# Patient Record
Sex: Female | Born: 1935 | Race: White | Hispanic: No | Marital: Married | Smoking: Never smoker
Health system: Southern US, Community
[De-identification: ages and names within clinical notes are randomized; demographics above are authoritative.]

## PROBLEM LIST (undated history)

## (undated) DIAGNOSIS — M25569 Pain in unspecified knee: Secondary | ICD-10-CM

## (undated) DIAGNOSIS — M25562 Pain in left knee: Principal | ICD-10-CM

## (undated) DIAGNOSIS — C801 Malignant (primary) neoplasm, unspecified: Secondary | ICD-10-CM

## (undated) DIAGNOSIS — M71112 Other infective bursitis, left shoulder: Secondary | ICD-10-CM

## (undated) DIAGNOSIS — I890 Lymphedema, not elsewhere classified: Secondary | ICD-10-CM

## (undated) DIAGNOSIS — G4700 Insomnia, unspecified: Secondary | ICD-10-CM

## (undated) DIAGNOSIS — I119 Hypertensive heart disease without heart failure: Secondary | ICD-10-CM

## (undated) DIAGNOSIS — I10 Essential (primary) hypertension: Secondary | ICD-10-CM

## (undated) DIAGNOSIS — H547 Unspecified visual loss: Secondary | ICD-10-CM

## (undated) DIAGNOSIS — R413 Other amnesia: Secondary | ICD-10-CM

## (undated) DIAGNOSIS — R55 Syncope and collapse: Secondary | ICD-10-CM

## (undated) DIAGNOSIS — M25519 Pain in unspecified shoulder: Secondary | ICD-10-CM

## (undated) DIAGNOSIS — R12 Heartburn: Secondary | ICD-10-CM

## (undated) DIAGNOSIS — N951 Menopausal and female climacteric states: Secondary | ICD-10-CM

## (undated) DIAGNOSIS — E507 Other ocular manifestations of vitamin A deficiency: Secondary | ICD-10-CM

## (undated) DIAGNOSIS — E785 Hyperlipidemia, unspecified: Secondary | ICD-10-CM

## (undated) DIAGNOSIS — Z853 Personal history of malignant neoplasm of breast: Secondary | ICD-10-CM

## (undated) DIAGNOSIS — M130 Polyarthritis, unspecified: Secondary | ICD-10-CM

## (undated) DIAGNOSIS — M810 Age-related osteoporosis without current pathological fracture: Secondary | ICD-10-CM

## (undated) DIAGNOSIS — K649 Unspecified hemorrhoids: Secondary | ICD-10-CM

## (undated) DIAGNOSIS — C50919 Malignant neoplasm of unspecified site of unspecified female breast: Secondary | ICD-10-CM

## (undated) DIAGNOSIS — R32 Unspecified urinary incontinence: Secondary | ICD-10-CM

## (undated) DIAGNOSIS — M79609 Pain in unspecified limb: Secondary | ICD-10-CM

## (undated) DIAGNOSIS — M25549 Pain in joints of unspecified hand: Secondary | ICD-10-CM

## (undated) DIAGNOSIS — G43009 Migraine without aura, not intractable, without status migrainosus: Secondary | ICD-10-CM

## (undated) DIAGNOSIS — K625 Hemorrhage of anus and rectum: Secondary | ICD-10-CM

## (undated) DIAGNOSIS — M549 Dorsalgia, unspecified: Secondary | ICD-10-CM

## (undated) DIAGNOSIS — IMO0002 Reserved for concepts with insufficient information to code with codable children: Secondary | ICD-10-CM

## (undated) DIAGNOSIS — G43909 Migraine, unspecified, not intractable, without status migrainosus: Secondary | ICD-10-CM

## (undated) DIAGNOSIS — B372 Candidiasis of skin and nail: Secondary | ICD-10-CM

## (undated) DIAGNOSIS — C73 Malignant neoplasm of thyroid gland: Principal | ICD-10-CM

## (undated) DIAGNOSIS — M25579 Pain in unspecified ankle and joints of unspecified foot: Secondary | ICD-10-CM

## (undated) DIAGNOSIS — M25561 Pain in right knee: Secondary | ICD-10-CM

## (undated) DIAGNOSIS — M7150 Other bursitis, not elsewhere classified, unspecified site: Secondary | ICD-10-CM

## (undated) DIAGNOSIS — E039 Hypothyroidism, unspecified: Secondary | ICD-10-CM

## (undated) HISTORY — DX: Pain in unspecified shoulder: M25.519

## (undated) HISTORY — DX: Candidiasis of skin and nail: B37.2

## (undated) HISTORY — DX: Polyarthritis, unspecified: M13.0

## (undated) HISTORY — DX: Reserved for concepts with insufficient information to code with codable children: IMO0002

## (undated) HISTORY — DX: Other ocular manifestations of vitamin A deficiency: E50.7

## (undated) HISTORY — DX: Unspecified hemorrhoids: K64.9

## (undated) HISTORY — DX: Personal history of malignant neoplasm of breast: Z85.3

## (undated) HISTORY — DX: Migraine, unspecified, not intractable, without status migrainosus: G43.909

## (undated) HISTORY — DX: Unspecified visual loss: H54.7

## (undated) HISTORY — DX: Essential (primary) hypertension: I10

## (undated) HISTORY — DX: Pain in unspecified ankle and joints of unspecified foot: M25.579

## (undated) HISTORY — DX: Migraine without aura, not intractable, without status migrainosus: G43.009

## (undated) HISTORY — DX: Other amnesia: R41.3

## (undated) HISTORY — DX: Hyperlipidemia, unspecified: E78.5

## (undated) HISTORY — DX: Age-related osteoporosis without current pathological fracture: M81.0

## (undated) HISTORY — DX: Other bursitis, not elsewhere classified, unspecified site: M71.50

## (undated) HISTORY — DX: Pain in unspecified knee: M25.569

## (undated) HISTORY — DX: Menopausal and female climacteric states: N95.1

## (undated) HISTORY — DX: Dorsalgia, unspecified: M54.9

## (undated) HISTORY — DX: Syncope and collapse: R55

## (undated) HISTORY — DX: Pain in left knee: M25.562

## (undated) HISTORY — DX: Hemorrhage of anus and rectum: K62.5

## (undated) HISTORY — DX: Malignant neoplasm of thyroid gland: C73

## (undated) HISTORY — DX: Hypothyroidism, unspecified: E03.9

## (undated) HISTORY — DX: Pain in joints of unspecified hand: M25.549

## (undated) HISTORY — DX: Pain in right knee: M25.561

## (undated) HISTORY — DX: Insomnia, unspecified: G47.00

## (undated) HISTORY — DX: Pain in unspecified limb: M79.609

## (undated) HISTORY — PX: ABDOMINAL HYSTERECTOMY: SHX81

## (undated) HISTORY — DX: Unspecified urinary incontinence: R32

## (undated) HISTORY — DX: Heartburn: R12

## (undated) HISTORY — DX: Hypertensive heart disease without heart failure: I11.9

## (undated) HISTORY — DX: Lymphedema, not elsewhere classified: I89.0

---

## 1979-12-10 HISTORY — PX: TOTAL ABDOMINAL HYSTERECTOMY W/ BILATERAL SALPINGOOPHORECTOMY: SHX83

## 1979-12-10 HISTORY — PX: BILATERAL OOPHORECTOMY: SHX1221

## 1993-12-09 DIAGNOSIS — I119 Hypertensive heart disease without heart failure: Secondary | ICD-10-CM

## 1993-12-09 HISTORY — DX: Hypertensive heart disease without heart failure: I11.9

## 1998-12-09 DIAGNOSIS — Z853 Personal history of malignant neoplasm of breast: Secondary | ICD-10-CM

## 1998-12-09 HISTORY — DX: Personal history of malignant neoplasm of breast: Z85.3

## 1999-10-10 HISTORY — PX: MASTECTOMY: SHX3

## 2000-01-10 HISTORY — PX: BREAST REDUCTION SURGERY: SHX8

## 2000-01-10 HISTORY — PX: BREAST RECONSTRUCTION: SHX9

## 2003-02-05 LAB — HM COLONOSCOPY: HM Colonoscopy: NEGATIVE

## 2004-01-10 HISTORY — PX: COLONOSCOPY: SHX174

## 2004-01-17 LAB — HM COLONOSCOPY: HM COLON: NEGATIVE

## 2005-09-04 HISTORY — PX: CATARACT EXTRACTION: SUR2

## 2010-04-03 LAB — HM DEXA SCAN

## 2012-05-16 DIAGNOSIS — G43909 Migraine, unspecified, not intractable, without status migrainosus: Secondary | ICD-10-CM

## 2012-05-16 HISTORY — DX: Migraine, unspecified, not intractable, without status migrainosus: G43.909

## 2012-12-09 DIAGNOSIS — M25561 Pain in right knee: Secondary | ICD-10-CM

## 2012-12-09 DIAGNOSIS — M25562 Pain in left knee: Secondary | ICD-10-CM

## 2012-12-09 HISTORY — PX: TOTAL KNEE ARTHROPLASTY: SHX125

## 2012-12-09 HISTORY — DX: Pain in right knee: M25.561

## 2012-12-09 HISTORY — DX: Pain in left knee: M25.562

## 2014-04-12 LAB — LIPID PANEL
Cholesterol: 207 mg/dL — AB (ref 0–200)
HDL: 48 mg/dL (ref 35–70)
LDL Cholesterol: 118 mg/dL
Triglycerides: 205 mg/dL — AB (ref 40–160)

## 2014-04-12 LAB — BASIC METABOLIC PANEL
BUN: 19 mg/dL (ref 4–21)
CREATININE: 0.9 mg/dL (ref 0.5–1.1)
GLUCOSE: 94 mg/dL
Potassium: 4.3 mmol/L (ref 3.4–5.3)
Sodium: 135 mmol/L — AB (ref 137–147)

## 2014-04-12 LAB — HEPATIC FUNCTION PANEL
ALT: 23 U/L (ref 7–35)
AST: 19 U/L (ref 13–35)
Alkaline Phosphatase: 70 U/L (ref 25–125)
BILIRUBIN DIRECT: 0.1 mg/dL (ref 0.01–0.4)
BILIRUBIN, TOTAL: 0.3 mg/dL

## 2014-04-12 LAB — CBC AND DIFFERENTIAL
HCT: 40 % (ref 36–46)
HEMOGLOBIN: 12.9 g/dL (ref 12.0–16.0)
Platelets: 333 10*3/uL (ref 150–399)
WBC: 5.8 10*3/mL

## 2014-04-12 LAB — TSH: TSH: 0.99 u[IU]/mL (ref 0.41–5.90)

## 2014-04-29 LAB — HM MAMMOGRAPHY: HM MAMMO: NEGATIVE

## 2014-08-02 ENCOUNTER — Non-Acute Institutional Stay: Payer: Medicare Other | Admitting: Internal Medicine

## 2014-08-02 ENCOUNTER — Encounter: Payer: Self-pay | Admitting: Internal Medicine

## 2014-08-02 VITALS — BP 144/94 | HR 60 | Temp 97.5°F | Ht 64.0 in | Wt 163.0 lb

## 2014-08-02 DIAGNOSIS — M25561 Pain in right knee: Secondary | ICD-10-CM

## 2014-08-02 DIAGNOSIS — M810 Age-related osteoporosis without current pathological fracture: Secondary | ICD-10-CM

## 2014-08-02 DIAGNOSIS — E039 Hypothyroidism, unspecified: Secondary | ICD-10-CM

## 2014-08-02 DIAGNOSIS — M25569 Pain in unspecified knee: Secondary | ICD-10-CM

## 2014-08-02 DIAGNOSIS — E507 Other ocular manifestations of vitamin A deficiency: Secondary | ICD-10-CM

## 2014-08-02 DIAGNOSIS — M25562 Pain in left knee: Principal | ICD-10-CM

## 2014-08-02 DIAGNOSIS — I10 Essential (primary) hypertension: Secondary | ICD-10-CM

## 2014-08-02 DIAGNOSIS — R32 Unspecified urinary incontinence: Secondary | ICD-10-CM

## 2014-08-02 DIAGNOSIS — H11149 Conjunctival xerosis, unspecified, unspecified eye: Secondary | ICD-10-CM

## 2014-08-02 DIAGNOSIS — R12 Heartburn: Secondary | ICD-10-CM

## 2014-08-02 DIAGNOSIS — G4700 Insomnia, unspecified: Secondary | ICD-10-CM

## 2014-08-02 DIAGNOSIS — E785 Hyperlipidemia, unspecified: Secondary | ICD-10-CM

## 2014-08-02 DIAGNOSIS — R413 Other amnesia: Secondary | ICD-10-CM

## 2014-09-13 DIAGNOSIS — M25569 Pain in unspecified knee: Secondary | ICD-10-CM | POA: Insufficient documentation

## 2014-10-30 ENCOUNTER — Encounter: Payer: Self-pay | Admitting: Internal Medicine

## 2014-10-30 DIAGNOSIS — G4700 Insomnia, unspecified: Secondary | ICD-10-CM | POA: Insufficient documentation

## 2014-10-30 DIAGNOSIS — R413 Other amnesia: Secondary | ICD-10-CM | POA: Insufficient documentation

## 2014-10-30 DIAGNOSIS — M810 Age-related osteoporosis without current pathological fracture: Secondary | ICD-10-CM | POA: Insufficient documentation

## 2014-10-30 DIAGNOSIS — I10 Essential (primary) hypertension: Secondary | ICD-10-CM | POA: Insufficient documentation

## 2014-10-30 DIAGNOSIS — E039 Hypothyroidism, unspecified: Secondary | ICD-10-CM | POA: Insufficient documentation

## 2014-10-30 DIAGNOSIS — R32 Unspecified urinary incontinence: Secondary | ICD-10-CM | POA: Insufficient documentation

## 2014-10-30 DIAGNOSIS — E785 Hyperlipidemia, unspecified: Secondary | ICD-10-CM | POA: Insufficient documentation

## 2014-10-30 DIAGNOSIS — M25562 Pain in left knee: Principal | ICD-10-CM

## 2014-10-30 DIAGNOSIS — M25561 Pain in right knee: Secondary | ICD-10-CM | POA: Insufficient documentation

## 2014-10-30 DIAGNOSIS — E507 Other ocular manifestations of vitamin A deficiency: Secondary | ICD-10-CM | POA: Insufficient documentation

## 2014-10-30 DIAGNOSIS — R12 Heartburn: Secondary | ICD-10-CM | POA: Insufficient documentation

## 2014-10-30 NOTE — Progress Notes (Signed)
Patient ID: Frances Holland, female   DOB: [birth year], 80 y.o.   MRN: [id number]    HISTORY AND PHYSICAL  Location:  [facility] of Service: Clinic (12)   Extended Emergency Contact Information Primary Emergency Contact: [name],[name] Address: [address]          [place], [place] [address] [name] of [place] Phone: [phone] Work Phone: [phone] Mobile Phone: [phone] Relation: Son  Advanced Directive information Does patient have an advance directive?: Yes, Type of Advance Directive: Healthcare Power of [place];Living will  Chief Complaint  Patient presents with  . Medical Management of Chronic Issues    New Patient, blood pressure, thyroid  . Knee Pain    left knee starting to hurt, right knee replacement 2014, starting to burn. Use ice on both knees at night.    HPI:  Knee pain, bilateral: Patient describes trouble with her knees knee is starting to hurt. She had a right knee replacement 2014. This knee is no burning. She uses ice on both knees at night. She is also taking Tylenol. She has an appointment with Dr. Lorre Nick and October 2015. She has been getting an injection of steroids in the left knee about every 4 to 5 months.  Memory impairment: Mild. In 03/14/2014 the MMSE scored 29/30 and she passed the clock drawing.  Essential hypertension: Controlled  Heartburn: Using Tums. Has a history of belching. This stopped after the Tums. She was using ibuprofen when most symptomatic.  Hyperlipemia: Controlled  Senile osteoporosis: Supplements with vitamin D and calcium   Hypothyroidism, unspecified hypothyroidism type: Taking supplements after dinner.  Urinary incontinence, unspecified incontinence type: None cystocele. She wears protective pads.  Insomnia: Using warm milk to help sleep at night.   Xerophthalmia: Using Systane eyedrops     Past Medical History  Diagnosis Date  . Symptomatic menopausal or female climacteric  states   . Malignant neoplasm of breast (female), unspecified site 10/1999    left  . Pain in joint, lower leg   . Unspecified urinary incontinence   . Syncope and collapse   . Unspecified hypothyroidism   . Other lymphedema   . Senile osteoporosis   . Other and unspecified hyperlipidemia   . Unspecified polyarthropathy or polyarthritis, site unspecified   . Migraine without aura, without mention of intractable migraine without mention of status migrainosus   . Unspecified hypertensive heart disease without heart failure 1995  . Candidiasis of skin and nails   . Pain in joint, ankle and foot   . Unspecified visual loss   . Cellulitis and abscess of upper arm and forearm   . Pain in limb   . Other bursitis disorders   . Pain in joint, shoulder region   . Pain in joint, hand   . Hypertension   . Heartburn   . Memory impairment   . Insomnia   . Back pain   . Xerophthalmia   . History of breast cancer     History left mastectomy  . Lymphedema     Left arm following mastectomy  . Knee pain, bilateral 2014    Past Surgical History  Procedure Laterality Date  . Total abdominal hysterectomy w/ bilateral salpingoophorectomy  1981  . Bilateral oophorectomy  1981  . Mastectomy Left 10/1999  . Breast reduction surgery Right 01/2000  . Breast reconstruction Left 01/2000  . Colonoscopy  01/2004  . Total knee arthroplasty Right 2014    Patient Care Team: Estill Dooms,  MD as PCP - General (Internal Medicine)  History   Social History  . Marital Status: Married    Spouse Name: N/A    Number of Children: N/A  . Years of Education: N/A   Occupational History  . retired Estate agent     Social History Main Topics  . Smoking status: Never Smoker   . Smokeless tobacco: Never Used  . Alcohol Use: No  . Drug Use: No  . Sexual Activity: Not on file   Other Topics Concern  . Not on file   Social History Narrative   Lives at [facility] since 07/07/2014   Married  -[name]   Never smoked    Alcohol none   Caffeine 2 cups of coffee daily   Exercise walks daily    Living will,  POA     reports that she has never smoked. She has never used smokeless tobacco. She reports that she does not drink alcohol or use illicit drugs.  Family History  Problem Relation Age of Onset  . Heart disease Mother    Family Status  Relation Status Death Age  . Mother Deceased 18  . Father Deceased 67    old age  . Sister Alive   . Brother Alive   . Daughter Alive   . Son Alive   . Son Alive     Immunization History  Administered Date(s) Administered  . Influenza-Unspecified 08/10/2013  . Pneumococcal Polysaccharide-23 12/09/2002  . Tdap 02/21/2014    Allergies  Allergen Reactions  . Sulfa Antibiotics     Medications: Patient's Medications  New Prescriptions   No medications on file  Previous Medications   AMLODIPINE (NORVASC) 2.5 MG TABLET    Take 2.5 mg by mouth. Take one tablet daily for blood pressure   CALCIUM CITRATE-VITAMIN D (CALCIUM CITRATE + D PO)    Take 630 mg by mouth. With 500iu D one tablet daily in morning   CHOLECALCIFEROL (VITAMIN D) 2000 UNITS TABLET    Take 2,000 Units by mouth daily.   LEVOTHYROXINE (SYNTHROID, LEVOTHROID) 75 MCG TABLET    Take 75 mcg by mouth. Take one tablet daily for thyroid at dinner   LOSARTAN-HYDROCHLOROTHIAZIDE (HYZAAR) 100-12.5 MG PER TABLET    Take 1 tablet by mouth. Take one tablet daily for blood pressure   MULTIPLE VITAMINS-MINERALS (CENTRUM SILVER PO)    Take by mouth. Take one tablet daily   OMEGA-3 FATTY ACIDS (FISH OIL) 1200 MG CAPS    Take by mouth. Take one tablet daily  Modified Medications   No medications on file  Discontinued Medications   No medications on file    Review of Systems  Constitutional: Negative.  Negative for fever, chills, diaphoresis, activity change, appetite change, fatigue and unexpected weight change.  HENT: Negative.  Negative for congestion, ear discharge, ear  pain, hearing loss, postnasal drip, rhinorrhea, sore throat, tinnitus, trouble swallowing and voice change.   Eyes: Negative for pain, redness, itching and visual disturbance.       Corrective lenses. Dry eyes.  Respiratory: Negative.  Negative for cough, choking, shortness of breath and wheezing.   Cardiovascular: Negative for chest pain, palpitations and leg swelling.       History of hypertension.   Gastrointestinal: Negative for nausea, abdominal pain, diarrhea, constipation and abdominal distention.       Flatulence. History of hemorrhoids.  Endocrine: Negative for cold intolerance, heat intolerance, polydipsia, polyphagia and polyuria.       History of hypothyroidism  Genitourinary: Positive for frequency. Negative for dysuria, urgency, hematuria, flank pain, vaginal discharge, difficulty urinating and pelvic pain.       Status post hysterectomy.  Musculoskeletal: Positive for back pain. Negative for myalgias, arthralgias, gait problem, neck pain and neck stiffness.       History of leg cramps.  Skin: Negative for color change, pallor and rash.  Allergic/Immunologic: Negative.   Neurological: Negative for dizziness, tremors, seizures, syncope, weakness, numbness and headaches.  Hematological: Negative for adenopathy. Does not bruise/bleed easily.  Psychiatric/Behavioral: Negative for suicidal ideas, hallucinations, behavioral problems, confusion, sleep disturbance, dysphoric mood and agitation. The patient is not nervous/anxious and is not hyperactive.     Filed Vitals:   08/02/14 1015  BP: 144/94  Pulse: 60  Temp: 97.5 F (36.4 C)  TempSrc: Oral  Height: 5\' 4"  (1.626 m)  Weight: 163 lb (73.936 kg)  SpO2: 97%   Body mass index is 27.97 kg/(m^2).  Physical Exam  Constitutional: She is oriented to person, place, and time. She appears well-developed and well-nourished. No distress.  HENT:  Right Ear: External ear normal.  Left Ear: External ear normal.  Nose: Nose normal.    Mouth/Throat: Oropharynx is clear and moist. No oropharyngeal exudate.  Eyes: Conjunctivae and EOM are normal. Pupils are equal, round, and reactive to light. No scleral icterus.  Neck: No JVD present. No tracheal deviation present. No thyromegaly present.  Cardiovascular: Normal rate, regular rhythm, normal heart sounds and intact distal pulses.  Exam reveals no gallop and no friction rub.   No murmur heard. Pulmonary/Chest: Effort normal. No respiratory distress. She has no wheezes. She has no rales. She exhibits no tenderness.  Abdominal: She exhibits no distension and no mass. There is no tenderness.  Genitourinary:  History of hysterectomy  Musculoskeletal: Normal range of motion. She exhibits tenderness (Both knees). She exhibits no edema.  Lymphedema left arm status post mastectomy.  Lymphadenopathy:    She has no cervical adenopathy.  Neurological: She is alert and oriented to person, place, and time. No cranial nerve deficit. Coordination normal.  Skin: No rash noted. She is not diaphoretic. No erythema. No pallor.  Psychiatric: She has a normal mood and affect. Her behavior is normal. Judgment and thought content normal.     Labs reviewed: Nursing Home on 08/02/2014  Component Date Value Ref Range Status  . HM Colonoscopy 02/05/2003 Negative   Final  Abstract on 08/01/2014  Component Date Value Ref Range Status  . Hemoglobin 04/12/2014 12.9  12.0 - 16.0 g/dL Final  . HCT 04/12/2014 40  36 - 46 % Final  . Platelets 04/12/2014 333  150 - 399 K/L Final  . WBC 04/12/2014 5.8   Final  . HM Mammogram 04/29/2014 negative. [facility]    Final  . HM Dexa Scan 04/03/2010 [facility]   Final  . Glucose 04/12/2014 94   Final  . BUN 04/12/2014 19  4 - 21 mg/dL Final  . Creatinine 04/12/2014 0.9  0.5 - 1.1 mg/dL Final  . Potassium 04/12/2014 4.3  3.4 - 5.3 mmol/L Final  . Sodium 04/12/2014 135* 137 - 147 mmol/L Final  . Triglycerides 04/12/2014 205* 40 - 160  mg/dL Final  . Cholesterol 04/12/2014 207* 0 - 200 mg/dL Final  . HDL 04/12/2014 48  35 - 70 mg/dL Final  . LDL Cholesterol 04/12/2014 118   Final  . Alkaline Phosphatase 04/12/2014 70  25 - 125 U/L Final  . ALT 04/12/2014 23  7 -  35 U/L Final  . AST 04/12/2014 19  13 - 35 U/L Final  . Bilirubin, Direct 04/12/2014 0.1  0.01 - 0.4 mg/dL Final  . Bilirubin, Total 04/12/2014 0.3   Final  . TSH 04/12/2014 0.99  0.41 - 5.90 uIU/mL Final       Assessment/Plan 1. Knee pain, bilateral Continue with plans to see orthopedist  2. Memory impairment Mild. No medications needed at this time.  3. Essential hypertension Mild elevation in both SBP and DBP  4. Heartburn Asymptomatic at this time  5. Hyperlipemia Adequately controlled  6. Senile osteoporosis Continue with calcium and vitamin D  7. Hypothyroidism, unspecified hypothyroidism type Continue with 75 g levothyroxine daily  8. Urinary incontinence, unspecified incontinence type Discussed. Offered urology consultation. Patient prefers just to go on with her protective pads.  9. Insomnia Continue with warm milk  10. Xerophthalmia Continue with Systane drops

## 2015-05-12 ENCOUNTER — Other Ambulatory Visit: Payer: Self-pay | Admitting: *Deleted

## 2015-05-12 MED ORDER — AMLODIPINE BESYLATE 2.5 MG PO TABS: ORAL_TABLET | ORAL | Status: DC

## 2015-05-12 NOTE — Telephone Encounter (Signed)
[facility] [address] 

## 2015-05-16 ENCOUNTER — Encounter: Payer: Self-pay | Admitting: Internal Medicine

## 2015-05-16 ENCOUNTER — Non-Acute Institutional Stay: Payer: Medicare Other | Admitting: Internal Medicine

## 2015-05-16 VITALS — BP 144/84 | HR 72 | Temp 97.6°F | Ht 64.5 in | Wt 161.0 lb

## 2015-05-16 DIAGNOSIS — M25562 Pain in left knee: Secondary | ICD-10-CM

## 2015-05-16 DIAGNOSIS — M25561 Pain in right knee: Secondary | ICD-10-CM | POA: Diagnosis not present

## 2015-05-16 DIAGNOSIS — R32 Unspecified urinary incontinence: Secondary | ICD-10-CM | POA: Diagnosis not present

## 2015-05-16 DIAGNOSIS — R12 Heartburn: Secondary | ICD-10-CM

## 2015-05-16 DIAGNOSIS — I10 Essential (primary) hypertension: Secondary | ICD-10-CM

## 2015-05-16 DIAGNOSIS — M810 Age-related osteoporosis without current pathological fracture: Secondary | ICD-10-CM | POA: Diagnosis not present

## 2015-05-16 DIAGNOSIS — G43909 Migraine, unspecified, not intractable, without status migrainosus: Secondary | ICD-10-CM

## 2015-05-16 DIAGNOSIS — E039 Hypothyroidism, unspecified: Secondary | ICD-10-CM

## 2015-05-16 DIAGNOSIS — Z853 Personal history of malignant neoplasm of breast: Secondary | ICD-10-CM | POA: Diagnosis not present

## 2015-05-16 DIAGNOSIS — R413 Other amnesia: Secondary | ICD-10-CM | POA: Diagnosis not present

## 2015-05-16 DIAGNOSIS — E785 Hyperlipidemia, unspecified: Secondary | ICD-10-CM | POA: Diagnosis not present

## 2015-05-16 DIAGNOSIS — G4700 Insomnia, unspecified: Secondary | ICD-10-CM

## 2015-05-16 NOTE — Progress Notes (Signed)
Passed clock drawing 

## 2015-05-16 NOTE — Progress Notes (Signed)
Patient ID: Frances Holland, female   DOB: 1929-06-20, 81 y.o.   MRN: [id number]    HISTORY AND PHYSICAL  Location:  [place] of Service: Clinic (12)   Extended Emergency Contact Information Primary Emergency Contact: [name],[name] Address: [address]          [place], [place] [address] [name] of [place] Phone: [phone] Work Phone: [phone] Mobile Phone: [phone] Relation: Son  Advanced Directive information Does patient have an advance directive?: Yes, Type of Advance Directive: Healthcare Power of [place];Living will  Chief Complaint  Patient presents with  . Annual Exam    Comprehensive exam: blood pressure, thyroid, cholesterol, insomnia  . leakage    urine, up twice at night    HPI:  Patient last seen 6 months ago. Presents today for complete physical exam.  Essential hypertension: Controlled  Hypothyroidism, unspecified hypothyroidism type: Compensated  Hyperlipemia: Controlled  Knee pain, bilateral: Persistent problem for several years. Status post right TKR. No joint swelling. No significant change over the last few years.  Memory impairment: Noted in 2014 by previous physician. Current MMSE 26/30.  Heartburn: Asymptomatic  Insomnia: Improved  Senile osteoporosis: Last bone density 2014  Urinary incontinence, unspecified incontinence type: Persistent problem. Has never had urologic evaluation. Previous physician told her she had "dropped bladder".  Migraine without status migrainosus, not intractable, unspecified migraine type: No recent migraine headaches.    Past Medical History  Diagnosis Date  . Symptomatic menopausal or female climacteric states   . Pain in joint, lower leg   . Unspecified urinary incontinence   . Syncope and collapse   . Unspecified hypothyroidism   . Senile osteoporosis   . Other and unspecified hyperlipidemia   . Unspecified polyarthropathy or polyarthritis, site unspecified     . Migraine without aura, without mention of intractable migraine without mention of status migrainosus   . Unspecified hypertensive heart disease without heart failure 1995  . Candidiasis of skin and nails   . Pain in joint, ankle and foot   . Unspecified visual loss   . Cellulitis and abscess of upper arm and forearm   . Pain in limb   . Other bursitis disorders   . Pain in joint, shoulder region   . Pain in joint, hand   . Hypertension   . Heartburn   . Memory impairment   . Insomnia   . Back pain   . Xerophthalmia   . History of breast cancer 2000    History left mastectomy  . Lymphedema     Left arm following mastectomy  . Knee pain, bilateral 2014    Past Surgical History  Procedure Laterality Date  . Total abdominal hysterectomy w/ bilateral salpingoophorectomy  1981  . Bilateral oophorectomy  1981  . Mastectomy Left 10/1999  . Breast reduction surgery Right 01/2000  . Breast reconstruction Left 01/2000  . Colonoscopy  01/2004  . Total knee arthroplasty Right 2014    Patient Care Team: Estill Dooms, MD as PCP - General (Internal Medicine)  History   Social History  . Marital Status: Married    Spouse Name: N/A  . Number of Children: N/A  . Years of Education: N/A   Occupational History  . retired Estate agent     Social History Main Topics  . Smoking status: Never Smoker   . Smokeless tobacco: Never Used  . Alcohol Use: No  . Drug Use: No  . Sexual Activity: Not on file  Other Topics Concern  . Not on file   Social History Narrative   Lives at [facility] since 07/07/2014   Married -[name]   Never smoked    Alcohol none   Caffeine 2 cups of coffee daily   Exercise walks daily, exercise class half hour 2-3 times a week   Living will,  POA     reports that she has never smoked. She has never used smokeless tobacco. She reports that she does not drink alcohol or use illicit drugs.  Family History  Problem Relation Age of Onset  .  Heart disease Mother    Family Status  Relation Status Death Age  . Mother Deceased 32  . Father Deceased 41    old age  . Sister Alive   . Daughter Alive   . Son Alive   . Son Alive   . Sister Alive     Immunization History  Administered Date(s) Administered  . Influenza-Unspecified 08/10/2013  . Pneumococcal Polysaccharide-23 12/09/2002  . Tdap 02/21/2014    Allergies  Allergen Reactions  . Sulfa Antibiotics     Medications: Patient's Medications  New Prescriptions   No medications on file  Previous Medications   AMLODIPINE (NORVASC) 2.5 MG TABLET    Take one tablet by mouth once daily for blood pressure   ASPIRIN 81 MG TABLET    Take 81 mg by mouth daily. Take one tablet at bedtime   CALCIUM CITRATE-VITAMIN D (CALCIUM CITRATE + D PO)    Take 630 mg by mouth. With 500iu D one tablet daily in morning   CHOLECALCIFEROL (VITAMIN D) 2000 UNITS TABLET    Take by mouth. Take 1,000 units in evening   DIPHENHYDRAMINE-ACETAMINOPHEN (TYLENOL PM) 25-500 MG TABS    Take 1 tablet by mouth. Take one at bedtime   LEVOTHYROXINE (SYNTHROID, LEVOTHROID) 75 MCG TABLET    Take 75 mcg by mouth. Take one tablet daily for thyroid at dinner   LOSARTAN-HYDROCHLOROTHIAZIDE (HYZAAR) 100-12.5 MG PER TABLET    Take 1 tablet by mouth. Take one tablet daily for blood pressure, in morning   MULTIPLE VITAMINS-MINERALS (CENTRUM SILVER PO)    Take by mouth. Take one tablet daily in morning   OMEGA-3 FATTY ACIDS (FISH OIL) 1200 MG CAPS    Take by mouth. Take one tablet daily, in morning  Modified Medications   No medications on file  Discontinued Medications   No medications on file    Review of Systems  Constitutional: Negative.  Negative for fever, chills, diaphoresis, activity change, appetite change, fatigue and unexpected weight change.  HENT: Negative for congestion, ear discharge, ear pain, hearing loss, postnasal drip, rhinorrhea, sore throat, tinnitus, trouble swallowing and voice change.     Eyes: Negative for pain, redness, itching and visual disturbance.       Corrective lenses. Dry eyes.  Respiratory: Negative.  Negative for cough, choking, shortness of breath and wheezing.   Cardiovascular: Negative for chest pain, palpitations and leg swelling.       History of hypertension.  Gastrointestinal: Negative for nausea, abdominal pain, diarrhea, constipation and abdominal distention.       Flatulence. History of hemorrhoids.  Endocrine: Negative for cold intolerance, heat intolerance, polydipsia, polyphagia and polyuria.       History of hypothyroidism  Genitourinary: Positive for frequency. Negative for dysuria, urgency, hematuria, flank pain, vaginal discharge, difficulty urinating and pelvic pain.       History of hysterectomy and bilateral salpingo-oophorectomy.  Musculoskeletal: Positive for  back pain. Negative for myalgias, arthralgias, gait problem, neck pain and neck stiffness.       History of leg cramps.  Skin: Negative for color change, pallor and rash.  Allergic/Immunologic: Negative.   Neurological: Negative for dizziness, tremors, seizures, syncope, weakness, numbness and headaches.       History migraine headaches.  Hematological: Negative for adenopathy. Does not bruise/bleed easily.  Psychiatric/Behavioral: Negative for suicidal ideas, hallucinations, behavioral problems, confusion, sleep disturbance, dysphoric mood and agitation. The patient is not nervous/anxious and is not hyperactive.     Filed Vitals:   05/16/15 1054  BP: 144/84  Pulse: 72  Temp: 97.6 F (36.4 C)  TempSrc: Oral  Height: 5' 4.5" (1.638 m)  Weight: 161 lb (73.029 kg)  SpO2: 99%   Body mass index is 27.22 kg/(m^2).  Physical Exam  Constitutional: She is oriented to person, place, and time. She appears well-developed and well-nourished. No distress.  HENT:  Right Ear: External ear normal.  Left Ear: External ear normal.  Nose: Nose normal.  Mouth/Throat: Oropharynx is clear and  moist. No oropharyngeal exudate.  Eyes: Conjunctivae and EOM are normal. Pupils are equal, round, and reactive to light. No scleral icterus.  Neck: No JVD present. No tracheal deviation present. No thyromegaly present.  Cardiovascular: Normal rate, regular rhythm, normal heart sounds and intact distal pulses.  Exam reveals no gallop and no friction rub.   No murmur heard. Pulmonary/Chest: Effort normal. No respiratory distress. She has no wheezes. She has no rales. She exhibits no tenderness.  Partial left mastectomy. Scars from breast reduction surgery of the right breast.  Abdominal: She exhibits no distension and no mass. There is no tenderness.  Genitourinary:  Absent cervix. Absent ovaries. Mild cystocele. Stool heme-negative.  Musculoskeletal: Normal range of motion. She exhibits tenderness (Both knees). She exhibits no edema.  Lymphedema left arm status post mastectomy.  Lymphadenopathy:    She has no cervical adenopathy.  Neurological: She is alert and oriented to person, place, and time. No cranial nerve deficit. Coordination normal.  05/15/2015 MMSE 26/30. Passed clock drawing.  Skin: No rash noted. She is not diaphoretic. No erythema. No pallor.  Scars left and right breasts and right knee.  Psychiatric: She has a normal mood and affect. Her behavior is normal. Judgment and thought content normal.     Labs reviewed: Nursing Home on 05/16/2015  Component Date Value Ref Range Status  . HM Colonoscopy 01/17/2004 Negative   Final     Assessment/Plan  1. Essential hypertension Controlled  2. Hypothyroidism, unspecified hypothyroidism type Compensated  3. Hyperlipemia Controlled  4. Knee pain, bilateral No significant change. Bothersome walking frequently.  5. Memory impairment Mild impairment  6. Heartburn Asymptomatic  7. Insomnia Improved  8. Senile osteoporosis Remain on current supplements. Schedule DEXA scan.  9. Urinary incontinence, unspecified  incontinence type Discussed referral to urologist with patient. She prefers to wait.  10. Migraine without status migrainosus, not intractable, unspecified migraine type Remote history

## 2015-05-17 ENCOUNTER — Encounter: Payer: Self-pay | Admitting: Internal Medicine

## 2015-05-17 DIAGNOSIS — Z853 Personal history of malignant neoplasm of breast: Secondary | ICD-10-CM | POA: Insufficient documentation

## 2015-05-31 ENCOUNTER — Telehealth: Payer: Self-pay | Admitting: *Deleted

## 2015-05-31 ENCOUNTER — Encounter: Payer: Self-pay | Admitting: *Deleted

## 2015-05-31 DIAGNOSIS — M810 Age-related osteoporosis without current pathological fracture: Secondary | ICD-10-CM

## 2015-05-31 NOTE — Telephone Encounter (Signed)
Anderson Malta with [facility] called back and stated that we would find the Dexa in Media. Looked but only seen 2011 Dexa. Jennifer apoligized and stated that she was looking at the encounter date. Placed order back in Epic and she will call patient to schedule an appointment.

## 2015-05-31 NOTE — Telephone Encounter (Signed)
Received fax from Sandre Kitty at [facility] regarding Taina Landry stating "This patient had a Dexa in 2015. Insurance Jaxyn Mestas not cover another exam in 2016. Insurance typically only covers wvery 2 years. Patient can be scheduled; however, she Shlonda Dolloff be responsible for the charges for the exam. Do you still want the exam scheduled for this patient?"   Per Dr. Manning Charity exam. Please send report from 2015. It is not in EPIC. Fax to [phone]  I called and left voicemail message for Sandre Kitty at [facility] stating what Dr. Nyoka Cowden said and asked her to fax the Dexa Scan to [place] and cancel the Dexa. Gave her our number if any questions.

## 2015-05-31 NOTE — Addendum Note (Signed)
Addended by: Rafael Bihari A on: 05/31/2015 12:33 PM   Modules accepted: Orders

## 2015-06-01 NOTE — Telephone Encounter (Signed)
error 

## 2015-06-06 ENCOUNTER — Other Ambulatory Visit: Payer: Self-pay | Admitting: Internal Medicine

## 2015-06-06 DIAGNOSIS — Z1231 Encounter for screening mammogram for malignant neoplasm of breast: Secondary | ICD-10-CM

## 2015-06-06 DIAGNOSIS — Z9012 Acquired absence of left breast and nipple: Secondary | ICD-10-CM

## 2015-06-29 ENCOUNTER — Ambulatory Visit
Admission: RE | Admit: 2015-06-29 | Discharge: 2015-06-29 | Disposition: A | Discharge status: Home / Self Care | Payer: Medicare Other | Source: Ambulatory Visit | Attending: Internal Medicine | Admitting: Internal Medicine

## 2015-06-29 DIAGNOSIS — Z1231 Encounter for screening mammogram for malignant neoplasm of breast: Secondary | ICD-10-CM

## 2015-06-29 DIAGNOSIS — M810 Age-related osteoporosis without current pathological fracture: Secondary | ICD-10-CM

## 2015-06-29 DIAGNOSIS — Z9012 Acquired absence of left breast and nipple: Secondary | ICD-10-CM

## 2015-07-03 ENCOUNTER — Other Ambulatory Visit: Payer: Self-pay | Admitting: *Deleted

## 2015-07-03 MED ORDER — LOSARTAN POTASSIUM-HCTZ 100-12.5 MG PO TABS: ORAL_TABLET | ORAL | Status: DC

## 2015-07-03 MED ORDER — LEVOTHYROXINE SODIUM 75 MCG PO TABS: ORAL_TABLET | ORAL | Status: DC

## 2015-07-03 NOTE — Telephone Encounter (Signed)
[place]

## 2015-07-04 ENCOUNTER — Telehealth: Payer: Self-pay

## 2015-07-04 NOTE — Telephone Encounter (Signed)
Called spoke with patient about Dr. Hervey Ard notes she said she understood what she was told.

## 2015-07-10 ENCOUNTER — Encounter: Payer: Self-pay | Admitting: Internal Medicine

## 2015-11-01 ENCOUNTER — Ambulatory Visit: Payer: Medicare Other | Admitting: Podiatry

## 2015-11-06 ENCOUNTER — Other Ambulatory Visit: Payer: Self-pay

## 2015-11-06 LAB — HEPATIC FUNCTION PANEL
ALT: 20 U/L (ref 7–35)
AST: 19 U/L (ref 13–35)
Alkaline Phosphatase: 65 U/L (ref 25–125)
Bilirubin, Total: 0.4 mg/dL

## 2015-11-06 LAB — LIPID PANEL
CHOLESTEROL: 204 mg/dL — AB (ref 0–200)
HDL: 47 mg/dL (ref 35–70)
LDL CALC: 113 mg/dL
Triglycerides: 221 mg/dL — AB (ref 40–160)

## 2015-11-06 LAB — BASIC METABOLIC PANEL
BUN: 14 mg/dL (ref 4–21)
Creatinine: 0.8 mg/dL (ref 0.5–1.1)
GLUCOSE: 83 mg/dL
Potassium: 4.4 mmol/L (ref 3.4–5.3)
Sodium: 139 mmol/L (ref 137–147)

## 2015-11-06 LAB — TSH: TSH: 2.95 u[IU]/mL (ref 0.41–5.90)

## 2015-11-14 ENCOUNTER — Non-Acute Institutional Stay: Payer: Medicare Other | Admitting: Internal Medicine

## 2015-11-14 VITALS — BP 138/80 | HR 80 | Resp 14 | Wt 161.6 lb

## 2015-11-14 DIAGNOSIS — I10 Essential (primary) hypertension: Secondary | ICD-10-CM | POA: Diagnosis not present

## 2015-11-14 DIAGNOSIS — E039 Hypothyroidism, unspecified: Secondary | ICD-10-CM

## 2015-11-14 DIAGNOSIS — E785 Hyperlipidemia, unspecified: Secondary | ICD-10-CM | POA: Diagnosis not present

## 2015-11-14 DIAGNOSIS — R12 Heartburn: Secondary | ICD-10-CM | POA: Diagnosis not present

## 2015-11-14 DIAGNOSIS — M25561 Pain in right knee: Secondary | ICD-10-CM

## 2015-11-14 NOTE — Progress Notes (Signed)
Patient ID: Frances Holland, female   DOB: 10-30-1929, 81 y.o.   MRN: [id number]    [facility]     Place of Service: Clinic (12)     Allergies  Allergen Reactions  . Sulfa Antibiotics   . Sulfamethoxazole Rash    Chief Complaint  Patient presents with  . Medical Management of Chronic Issues    6 mo f/u HTN, some sleeping issues, right knee has some burning    HPI:  Right knee pain - status post TKR. Burning sensations at night.  Essential hypertension - controlled  Hypothyroidism, unspecified hypothyroidism type - compensated  Hyperlipemia - controlled  Heartburn - sensation of needing to work frequently. It is not every day. There is no nausea.    Medications: Patient's Medications  New Prescriptions   No medications on file  Previous Medications   AMLODIPINE (NORVASC) 2.5 MG TABLET    Take one tablet by mouth once daily for blood pressure   ASPIRIN 81 MG TABLET    Take 81 mg by mouth daily. Take one tablet at bedtime   CALCIUM CITRATE-VITAMIN D (CALCIUM CITRATE + D PO)    Take 630 mg by mouth. With 500iu D one tablet daily in morning   CHOLECALCIFEROL (VITAMIN D) 2000 UNITS TABLET    Take by mouth. Take 1,000 units in evening   DIPHENHYDRAMINE-ACETAMINOPHEN (TYLENOL PM) 25-500 MG TABS    Take 1 tablet by mouth. Take one at bedtime   LEVOTHYROXINE (SYNTHROID, LEVOTHROID) 75 MCG TABLET    Take one tablet by mouth once daily 30 minutes before breakfast for thyroid   LOSARTAN-HYDROCHLOROTHIAZIDE (HYZAAR) 100-12.5 MG PER TABLET    Take one tablet by mouth once daily for blood pressure, in morning   MULTIPLE VITAMINS-MINERALS (CENTRUM SILVER PO)    Take by mouth. Take one tablet daily in morning   OMEGA-3 FATTY ACIDS (FISH OIL) 1200 MG CAPS    Take by mouth. Take one tablet daily, in morning  Modified Medications   No medications on file  Discontinued Medications   No medications on file     Review of Systems  Constitutional: Negative.  Negative for  fever, chills, diaphoresis, activity change, appetite change, fatigue and unexpected weight change.  HENT: Negative for congestion, ear discharge, ear pain, hearing loss, postnasal drip, rhinorrhea, sore throat, tinnitus, trouble swallowing and voice change.   Eyes: Negative for pain, redness, itching and visual disturbance.       Corrective lenses. Dry eyes.  Respiratory: Negative.  Negative for cough, choking, shortness of breath and wheezing.   Cardiovascular: Negative for chest pain, palpitations and leg swelling.       History of hypertension.  Gastrointestinal: Negative for nausea, abdominal pain, diarrhea, constipation and abdominal distention.       Flatulence. History of hemorrhoids. Sensations of having to burp.  Endocrine: Negative for cold intolerance, heat intolerance, polydipsia, polyphagia and polyuria.       History of hypothyroidism  Genitourinary: Positive for frequency. Negative for dysuria, urgency, hematuria, flank pain, vaginal discharge, difficulty urinating and pelvic pain.       History of hysterectomy and bilateral salpingo-oophorectomy.  Musculoskeletal: Positive for back pain. Negative for myalgias, arthralgias, gait problem, neck pain and neck stiffness.       History of leg cramps. Burning at the right knee that was replaced.   Skin: Negative for color change, pallor and rash.  Allergic/Immunologic: Negative.   Neurological: Negative for dizziness, tremors, seizures, syncope, weakness, numbness and headaches.  History migraine headaches.  Hematological: Negative for adenopathy. Does not bruise/bleed easily.  Psychiatric/Behavioral: Negative for suicidal ideas, hallucinations, behavioral problems, confusion, sleep disturbance, dysphoric mood and agitation. The patient is not nervous/anxious and is not hyperactive.     Filed Vitals:   11/14/15 0919  BP: 138/80  Pulse: 80  Resp: 14  Weight: 161 lb 9.6 oz (73.301 kg)   Body mass index is 27.32  kg/(m^2).  Physical Exam  Constitutional: She is oriented to person, place, and time. She appears well-developed and well-nourished. No distress.  HENT:  Right Ear: External ear normal.  Left Ear: External ear normal.  Nose: Nose normal.  Mouth/Throat: Oropharynx is clear and moist. No oropharyngeal exudate.  Eyes: Conjunctivae and EOM are normal. Pupils are equal, round, and reactive to light. No scleral icterus.  Neck: No JVD present. No tracheal deviation present. No thyromegaly present.  Cardiovascular: Normal rate, regular rhythm, normal heart sounds and intact distal pulses.  Exam reveals no gallop and no friction rub.   No murmur heard. Pulmonary/Chest: Effort normal. No respiratory distress. She has no wheezes. She has no rales. She exhibits no tenderness.  Partial left mastectomy. Scars from breast reduction surgery of the right breast.  Abdominal: She exhibits no distension and no mass. There is no tenderness.  Genitourinary:  Absent cervix. Absent ovaries. Mild cystocele. Stool heme-negative.  Musculoskeletal: Normal range of motion. She exhibits tenderness (Both knees). She exhibits no edema.  Lymphedema left arm status post mastectomy.  Lymphadenopathy:    She has no cervical adenopathy.  Neurological: She is alert and oriented to person, place, and time. No cranial nerve deficit. Coordination normal.  05/15/2015 MMSE 26/30. Passed clock drawing.  Skin: No rash noted. She is not diaphoretic. No erythema. No pallor.  Scars left and right breasts and right knee.  Psychiatric: She has a normal mood and affect. Her behavior is normal. Judgment and thought content normal.     Labs reviewed: Lab Summary Latest Ref Rng 11/06/2015 04/12/2014  Hemoglobin 12.0 - 16.0 g/dL (None) 12.9  Hematocrit 36 - 46 % (None) 40  White count - (None) 5.8  Platelet count 150 - 399 K/L (None) 333  Sodium 137 - 147 mmol/L 139 135(A)  Potassium 3.4 - 5.3 mmol/L 4.4 4.3  Calcium - (None) (None)   Phosphorus - (None) (None)  Creatinine 0.5 - 1.1 mg/dL 0.8 0.9  AST 13 - 35 U/L 19 19  Alk Phos 25 - 125 U/L 65 70  Bilirubin - (None) (None)  Glucose - 83 94  Cholesterol 0 - 200 mg/dL 204(A) 207(A)  HDL cholesterol 35 - 70 mg/dL 47 48  Triglycerides 40 - 160 mg/dL 221(A) 205(A)  LDL Direct - (None) (None)  LDL Calc - 113 118  Total protein - (None) (None)  Albumin - (None) (None)   Lab Results  Component Value Date   TSH 2.95 11/06/2015   Lab Results  Component Value Date   BUN 14 11/06/2015   No results found for: HGBA1C     Assessment/Plan   1. Right knee pain Continue use aspirin as needed  2. Essential hypertension Continue amlodipine  3. Hypothyroidism, unspecified hypothyroidism type Continue current dose levothyroxine  4. Hyperlipemia Continue to monitor.  5. Heartburn Use Tums as needed

## 2016-01-01 DIAGNOSIS — H3530 Unspecified macular degeneration: Secondary | ICD-10-CM | POA: Diagnosis not present

## 2016-01-04 ENCOUNTER — Ambulatory Visit: Payer: PPO | Admitting: Podiatry

## 2016-01-09 ENCOUNTER — Encounter: Payer: Self-pay | Admitting: Internal Medicine

## 2016-01-09 ENCOUNTER — Non-Acute Institutional Stay: Payer: PPO | Admitting: Internal Medicine

## 2016-01-09 VITALS — BP 112/80 | HR 89 | Temp 97.7°F | Resp 20 | Ht 65.0 in | Wt 161.6 lb

## 2016-01-09 DIAGNOSIS — J069 Acute upper respiratory infection, unspecified: Secondary | ICD-10-CM | POA: Diagnosis not present

## 2016-01-09 NOTE — Progress Notes (Signed)
Patient ID: Frances Holland, female   DOB: [birth year], 82 y.o.   MRN: [id number]    [facility] Room Number: [id number]  Place of Service: Clinic (12)     Allergies  Allergen Reactions  . Sulfa Antibiotics   . Sulfamethoxazole Rash    Chief Complaint  Patient presents with  . Acute Visit    sick and weak, has mucus and runny nose, cough. since Wednesday, no energy    HPI:  One week onset of acute respiratory infection. Started as a deep cough. Has persisted. No significant fever. No hemoptysis. Thin yellow sputum occurred today for the first time. Denies dyspnea. Has lost her appetite but denies nausea.   Medications: Patient's Medications  New Prescriptions   No medications on file  Previous Medications   AMLODIPINE (NORVASC) 2.5 MG TABLET    Take one tablet by mouth once daily for blood pressure   ASPIRIN 81 MG TABLET    Take 81 mg by mouth daily. Take one tablet at bedtime   CALCIUM CITRATE-VITAMIN D (CALCIUM CITRATE + D PO)    Take 630 mg by mouth. With 500iu D one tablet daily in morning   CHOLECALCIFEROL (VITAMIN D) 2000 UNITS TABLET    Take by mouth. Take 1,000 units in evening   DIPHENHYDRAMINE-ACETAMINOPHEN (TYLENOL PM) 25-500 MG TABS    Take 1 tablet by mouth. Take one at bedtime   LEVOTHYROXINE (SYNTHROID, LEVOTHROID) 75 MCG TABLET    Take one tablet by mouth once daily 30 minutes before breakfast for thyroid   LOSARTAN-HYDROCHLOROTHIAZIDE (HYZAAR) 100-12.5 MG PER TABLET    Take one tablet by mouth once daily for blood pressure, in morning   MULTIPLE VITAMINS-MINERALS (CENTRUM SILVER ADULT 50+) TABS    Take by mouth.   MULTIPLE VITAMINS-MINERALS (CENTRUM SILVER PO)    Take by mouth. Take one tablet daily in morning   OMEGA-3 FATTY ACIDS (FISH OIL) 1200 MG CAPS    Take by mouth. Take one tablet daily, in morning  Modified Medications   No medications on file  Discontinued Medications   No medications on file     Review of Systems    Constitutional: Negative.  Negative for fever, chills, diaphoresis, activity change, appetite change, fatigue and unexpected weight change.  HENT: Negative for congestion, ear discharge, ear pain, hearing loss, postnasal drip, rhinorrhea, sore throat, tinnitus, trouble swallowing and voice change.   Eyes: Negative for pain, redness, itching and visual disturbance.       Corrective lenses. Dry eyes.  Respiratory: Positive for cough (thin yelow sputum) and chest tightness. Negative for choking, shortness of breath and wheezing.        Chest congestion  Cardiovascular: Negative for chest pain, palpitations and leg swelling.       History of hypertension.  Gastrointestinal: Negative for nausea, abdominal pain, diarrhea, constipation and abdominal distention.       Flatulence. History of hemorrhoids. Sensations of having to burp.  Endocrine: Negative for cold intolerance, heat intolerance, polydipsia, polyphagia and polyuria.       History of hypothyroidism  Genitourinary: Positive for frequency. Negative for dysuria, urgency, hematuria, flank pain, vaginal discharge, difficulty urinating and pelvic pain.       History of hysterectomy and bilateral salpingo-oophorectomy.  Musculoskeletal: Positive for back pain. Negative for myalgias, arthralgias, gait problem, neck pain and neck stiffness.       History of leg cramps. Burning at the right knee that was replaced.   Skin: Negative for  color change, pallor and rash.  Allergic/Immunologic: Negative.   Neurological: Negative for dizziness, tremors, seizures, syncope, weakness, numbness and headaches.       History migraine headaches.  Hematological: Negative for adenopathy. Does not bruise/bleed easily.  Psychiatric/Behavioral: Negative for suicidal ideas, hallucinations, behavioral problems, confusion, sleep disturbance, dysphoric mood and agitation. The patient is not nervous/anxious and is not hyperactive.     Filed Vitals:   01/09/16 1038  BP:  112/80  Pulse: 89  Temp: 97.7 F (36.5 C)  TempSrc: Oral  Resp: 20  Height: _0  (1.651 m)  Weight: 161 lb 9.6 oz (73.301 kg)  SpO2: 97%   Wt Readings from Last 3 Encounters:  01/09/16 161 lb 9.6 oz (73.301 kg)  11/14/15 161 lb 9.6 oz (73.301 kg)  05/16/15 161 lb (73.029 kg)    Body mass index is 26.89 kg/(m^2).  Physical Exam  Constitutional: She is oriented to person, place, and time. She appears well-developed and well-nourished. No distress.  HENT:  Right Ear: External ear normal.  Left Ear: External ear normal.  Nose: Nose normal.  Mouth/Throat: Oropharynx is clear and moist. No oropharyngeal exudate.  Eyes: Conjunctivae and EOM are normal. Pupils are equal, round, and reactive to light. No scleral icterus.  Neck: No JVD present. No tracheal deviation present. No thyromegaly present.  Cardiovascular: Normal rate, regular rhythm, normal heart sounds and intact distal pulses.  Exam reveals no gallop and no friction rub.   No murmur heard. Pulmonary/Chest: Effort normal. No respiratory distress. She has no wheezes. She has no rales. She exhibits no tenderness.  Partial left mastectomy. Scars from breast reduction surgery of the right breast.  Abdominal: She exhibits no distension and no mass. There is no tenderness.  Genitourinary:  Absent cervix. Absent ovaries. Mild cystocele. Stool heme-negative.  Musculoskeletal: Normal range of motion. She exhibits tenderness (Both knees). She exhibits no edema.  Lymphedema left arm status post mastectomy.  Lymphadenopathy:    She has no cervical adenopathy.  Neurological: She is alert and oriented to person, place, and time. No cranial nerve deficit. Coordination normal.  05/15/2015 MMSE 26/30. Passed clock drawing.  Skin: No rash noted. She is not diaphoretic. No erythema. No pallor.  Scars left and right breasts and right knee.  Psychiatric: She has a normal mood and affect. Her behavior is normal. Judgment and thought content  normal.     Labs reviewed: Lab Summary Latest Ref Rng 11/06/2015 04/12/2014  Hemoglobin 12.0 - 16.0 g/dL (None) 12.9  Hematocrit 36 - 46 % (None) 40  White count - (None) 5.8  Platelet count 150 - 399 K/L (None) 333  Sodium 137 - 147 mmol/L 139 135(A)  Potassium 3.4 - 5.3 mmol/L 4.4 4.3  Calcium - (None) (None)  Phosphorus - (None) (None)  Creatinine 0.5 - 1.1 mg/dL 0.8 0.9  AST 13 - 35 U/L 19 19  Alk Phos 25 - 125 U/L 65 70  Bilirubin - (None) (None)  Glucose - 83 94  Cholesterol 0 - 200 mg/dL 204(A) 207(A)  HDL cholesterol 35 - 70 mg/dL 47 48  Triglycerides 40 - 160 mg/dL 221(A) 205(A)  LDL Direct - (None) (None)  LDL Calc - 113 118  Total protein - (None) (None)  Albumin - (None) (None)   Lab Results  Component Value Date   TSH 2.95 11/06/2015   Lab Results  Component Value Date   BUN 14 11/06/2015   BUN 19 04/12/2014   Lab Results  Component Value Date  CREATININE 0.8 11/06/2015   CREATININE 0.9 04/12/2014   No results found for: HGBA1C     Assessment/Plan  1. Acute upper respiratory infection Advised patient this is most likely viral. Antibiotics will not be helpful.  Recommended Robitussin-DM 10 mL every 4 hours, Mucinex DM one twice daily, or Delsym for the cough and congestion. Patient was advised to drink 510 ounce glasses of fluid daily to stay moisturized. She will run a room humidifier.

## 2016-01-17 ENCOUNTER — Ambulatory Visit: Payer: PPO | Admitting: Podiatry

## 2016-01-23 ENCOUNTER — Non-Acute Institutional Stay: Payer: PPO | Admitting: Internal Medicine

## 2016-01-23 ENCOUNTER — Encounter: Payer: Self-pay | Admitting: Internal Medicine

## 2016-01-23 VITALS — BP 120/72 | HR 85 | Temp 97.6°F | Resp 20 | Ht 65.0 in | Wt 161.0 lb

## 2016-01-23 DIAGNOSIS — J069 Acute upper respiratory infection, unspecified: Secondary | ICD-10-CM | POA: Diagnosis not present

## 2016-01-23 NOTE — Progress Notes (Signed)
Patient ID: Frances Holland, female   DOB: [birth year], 82 y.o.   MRN: [id number]    [facility]     Place of Service: Clinic (12)     Allergies  Allergen Reactions  . Sulfa Antibiotics   . Sulfamethoxazole Rash    Chief Complaint  Patient presents with  . Acute Visit    weak, no energy,     HPI:  Patient is afraid that she was relapsing with a recent viral problem. She felt so good yesterday that she cleaned her entire house including bathroom cleaning around the toilet down in her knees. She slept for a well last night. She went for a walk in the hallways today and found that her stamina was markedly diminished. Her husband has come down with the acute respiratory viral illness that she had. She was wearing that she may have gotten reinfected. She has not had any increase in cough. Although it has been a persistent problem since she was last seen it is improving. She has not run any fever. No nausea. No diarrhea.  Medications: Patient's Medications  New Prescriptions   No medications on file  Previous Medications   AMLODIPINE (NORVASC) 2.5 MG TABLET    Take one tablet by mouth once daily for blood pressure   ASPIRIN 81 MG TABLET    Take 81 mg by mouth daily. Take one tablet at bedtime   CALCIUM CITRATE-VITAMIN D (CALCIUM CITRATE + D PO)    Take 630 mg by mouth. With 500iu D one tablet daily in morning   CHOLECALCIFEROL (VITAMIN D) 2000 UNITS TABLET    Take by mouth. Take 1,000 units in evening   DIPHENHYDRAMINE-ACETAMINOPHEN (TYLENOL PM) 25-500 MG TABS    Take 1 tablet by mouth. Take one at bedtime   LEVOTHYROXINE (SYNTHROID, LEVOTHROID) 75 MCG TABLET    Take one tablet by mouth once daily 30 minutes before breakfast for thyroid   LOSARTAN-HYDROCHLOROTHIAZIDE (HYZAAR) 100-12.5 MG PER TABLET    Take one tablet by mouth once daily for blood pressure, in morning   MULTIPLE VITAMINS-MINERALS (CENTRUM SILVER PO)    Take by mouth. Take one tablet daily in morning   OMEGA-3  FATTY ACIDS (FISH OIL) 1200 MG CAPS    Take by mouth. Take one tablet daily, in morning  Modified Medications   No medications on file  Discontinued Medications   MULTIPLE VITAMINS-MINERALS (CENTRUM SILVER ADULT 50+) TABS    Take by mouth.     Review of Systems  Constitutional: Negative.  Negative for fever, chills, diaphoresis, activity change, appetite change, fatigue and unexpected weight change.  HENT: Negative for congestion, ear discharge, ear pain, hearing loss, postnasal drip, rhinorrhea, sore throat, tinnitus, trouble swallowing and voice change.   Eyes: Negative for pain, redness, itching and visual disturbance.       Corrective lenses. Dry eyes.  Respiratory: Positive for cough (thin yelow sputum) and chest tightness. Negative for choking, shortness of breath and wheezing.        Chest congestion  Cardiovascular: Negative for chest pain, palpitations and leg swelling.       History of hypertension.  Gastrointestinal: Negative for nausea, abdominal pain, diarrhea, constipation and abdominal distention.       Flatulence. History of hemorrhoids. Sensations of having to burp.  Endocrine: Negative for cold intolerance, heat intolerance, polydipsia, polyphagia and polyuria.       History of hypothyroidism  Genitourinary: Positive for frequency. Negative for dysuria, urgency, hematuria, flank pain, vaginal discharge, difficulty  urinating and pelvic pain.       History of hysterectomy and bilateral salpingo-oophorectomy.  Musculoskeletal: Positive for back pain. Negative for myalgias, arthralgias, gait problem, neck pain and neck stiffness.       History of leg cramps. Burning at the right knee that was replaced.   Skin: Negative for color change, pallor and rash.  Allergic/Immunologic: Negative.   Neurological: Negative for dizziness, tremors, seizures, syncope, weakness, numbness and headaches.       History migraine headaches.  Hematological: Negative for adenopathy. Does not  bruise/bleed easily.  Psychiatric/Behavioral: Negative for suicidal ideas, hallucinations, behavioral problems, confusion, sleep disturbance, dysphoric mood and agitation. The patient is not nervous/anxious and is not hyperactive.     Filed Vitals:   01/23/16 1059  BP: 120/72  Pulse: 85  Temp: 97.6 F (36.4 C)  TempSrc: Oral  Resp: 20  Height: _0  (1.651 m)  Weight: 161 lb (73.029 kg)  SpO2: 95%   Wt Readings from Last 3 Encounters:  01/23/16 161 lb (73.029 kg)  01/09/16 161 lb 9.6 oz (73.301 kg)  11/14/15 161 lb 9.6 oz (73.301 kg)    Body mass index is 26.79 kg/(m^2).  Physical Exam  Constitutional: She is oriented to person, place, and time. She appears well-developed and well-nourished. No distress.  HENT:  Right Ear: External ear normal.  Left Ear: External ear normal.  Nose: Nose normal.  Mouth/Throat: Oropharynx is clear and moist. No oropharyngeal exudate.  Eyes: Conjunctivae and EOM are normal. Pupils are equal, round, and reactive to light. No scleral icterus.  Neck: No JVD present. No tracheal deviation present. No thyromegaly present.  Cardiovascular: Normal rate, regular rhythm, normal heart sounds and intact distal pulses.  Exam reveals no gallop and no friction rub.   No murmur heard. Pulmonary/Chest: Effort normal. No respiratory distress. She has no wheezes. She has no rales. She exhibits no tenderness.  Partial left mastectomy. Scars from breast reduction surgery of the right breast.  Abdominal: She exhibits no distension and no mass. There is no tenderness.  Genitourinary:  Absent cervix. Absent ovaries. Mild cystocele. Stool heme-negative.  Musculoskeletal: Normal range of motion. She exhibits tenderness (Both knees). She exhibits no edema.  Lymphedema left arm status post mastectomy.  Lymphadenopathy:    She has no cervical adenopathy.  Neurological: She is alert and oriented to person, place, and time. No cranial nerve deficit. Coordination normal.    05/15/2015 MMSE 26/30. Passed clock drawing.  Skin: No rash noted. She is not diaphoretic. No erythema. No pallor.  Scars left and right breasts and right knee.  Psychiatric: She has a normal mood and affect. Her behavior is normal. Judgment and thought content normal.     Labs reviewed: Lab Summary Latest Ref Rng 11/06/2015 04/12/2014  Hemoglobin 12.0 - 16.0 g/dL (None) 12.9  Hematocrit 36 - 46 % (None) 40  White count - (None) 5.8  Platelet count 150 - 399 K/L (None) 333  Sodium 137 - 147 mmol/L 139 135(A)  Potassium 3.4 - 5.3 mmol/L 4.4 4.3  Calcium - (None) (None)  Phosphorus - (None) (None)  Creatinine 0.5 - 1.1 mg/dL 0.8 0.9  AST 13 - 35 U/L 19 19  Alk Phos 25 - 125 U/L 65 70  Bilirubin - (None) (None)  Glucose - 83 94  Cholesterol 0 - 200 mg/dL 204(A) 207(A)  HDL cholesterol 35 - 70 mg/dL 47 48  Triglycerides 40 - 160 mg/dL 221(A) 205(A)  LDL Direct - (None) (None)  LDL  Calc - 113 118  Total protein - (None) (None)  Albumin - (None) (None)   Lab Results  Component Value Date   TSH 2.95 11/06/2015   Lab Results  Component Value Date   BUN 14 11/06/2015   BUN 19 04/12/2014   Lab Results  Component Value Date   CREATININE 0.8 11/06/2015   CREATININE 0.9 04/12/2014   No results found for: HGBA1C     Assessment/Plan  1. Acute upper respiratory infection I do not think this patient has relapsed with her recent viral illness. She seems much improved since last time I saw her. I think her stamina is diminished because of the recent illness. I recommended that she continue to walk daily and try to resume her usual activities bit by bit.

## 2016-04-09 ENCOUNTER — Encounter: Payer: PPO | Admitting: Internal Medicine

## 2016-04-10 ENCOUNTER — Ambulatory Visit: Payer: PPO | Admitting: Podiatry

## 2016-04-17 ENCOUNTER — Encounter: Payer: Self-pay | Admitting: Podiatry

## 2016-04-17 ENCOUNTER — Ambulatory Visit (INDEPENDENT_AMBULATORY_CARE_PROVIDER_SITE_OTHER): Payer: PPO | Admitting: Podiatry

## 2016-04-17 ENCOUNTER — Ambulatory Visit (INDEPENDENT_AMBULATORY_CARE_PROVIDER_SITE_OTHER): Payer: PPO

## 2016-04-17 VITALS — BP 123/66 | HR 67 | Resp 16

## 2016-04-17 DIAGNOSIS — M2010 Hallux valgus (acquired), unspecified foot: Secondary | ICD-10-CM | POA: Diagnosis not present

## 2016-04-17 DIAGNOSIS — M779 Enthesopathy, unspecified: Secondary | ICD-10-CM | POA: Diagnosis not present

## 2016-04-17 MED ORDER — TRIAMCINOLONE ACETONIDE 10 MG/ML IJ SUSP
10.0000 mg | Freq: Once | INTRAMUSCULAR | Status: AC
  Administered 2016-04-17: 10 mg

## 2016-04-17 NOTE — Progress Notes (Signed)
   Subjective:    Patient ID: Frances Holland, female    DOB: 19-Oct-1929, 82 y.o.   MRN: [id number]  HPI  Chief Complaint  Patient presents with  . Foot Pain    1st MPJ and 2nd toe left and forefoot right - aching for few years, notices that her shoes wear out quickly and rubbing the side of left foot, redness, the insides the of the shoes are "lumpy"       Review of Systems  Genitourinary: Positive for frequency.  Musculoskeletal: Positive for back pain.  Hematological: Positive for adenopathy.  All other systems reviewed and are negative.      Objective:   Physical Exam        Assessment & Plan:

## 2016-04-18 NOTE — Progress Notes (Signed)
Subjective:     Patient ID: Frances Holland, female   DOB: [birth year], 82 y.o.   MRN: [id number]  HPI patient points with pain around the left first MPJ and fluid buildup. States that it's been bothering her and also her feet in general get sore and tired and make it hard to walk   Review of Systems  All other systems reviewed and are negative.      Objective:   Physical Exam  Constitutional: She is oriented to person, place, and time.  Cardiovascular: Intact distal pulses.   Musculoskeletal: Normal range of motion.  Neurological: She is oriented to person, place, and time.  Skin: Skin is warm.  Nursing note and vitals reviewed.  neurovascular status found to be intact muscle strength adequate range of motion within normal limits with patient found to have inflammatory changes around the first MPJ left with inflammation and fluid around the joint surface. 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 Injected around the first MPJ left 3 mg Kenalog 5 mg Xylocaine and discussed long-term orthotics. Patient be seen back depending on pain  X-ray report indicates that there is no indications of stress fracture with moderate osteoporosis and arthritis noted

## 2016-04-23 ENCOUNTER — Encounter: Payer: Self-pay | Admitting: Internal Medicine

## 2016-04-23 ENCOUNTER — Non-Acute Institutional Stay: Payer: PPO | Admitting: Internal Medicine

## 2016-04-23 ENCOUNTER — Ambulatory Visit
Admission: RE | Admit: 2016-04-23 | Discharge: 2016-04-23 | Disposition: A | Discharge status: Home / Self Care | Payer: PPO | Source: Ambulatory Visit | Attending: Internal Medicine | Admitting: Internal Medicine

## 2016-04-23 VITALS — BP 134/80 | HR 69 | Temp 97.9°F | Ht 65.0 in | Wt 164.0 lb

## 2016-04-23 DIAGNOSIS — J9811 Atelectasis: Secondary | ICD-10-CM | POA: Diagnosis not present

## 2016-04-23 DIAGNOSIS — Z853 Personal history of malignant neoplasm of breast: Secondary | ICD-10-CM

## 2016-04-23 DIAGNOSIS — M25512 Pain in left shoulder: Secondary | ICD-10-CM

## 2016-04-23 DIAGNOSIS — M25519 Pain in unspecified shoulder: Secondary | ICD-10-CM

## 2016-04-23 DIAGNOSIS — M19012 Primary osteoarthritis, left shoulder: Secondary | ICD-10-CM | POA: Diagnosis not present

## 2016-04-23 HISTORY — DX: Pain in unspecified shoulder: M25.519

## 2016-04-23 MED ORDER — CYCLOBENZAPRINE HCL 10 MG PO TABS: 10.0000 mg | ORAL_TABLET | Freq: Three times a day (TID) | ORAL | Status: DC | PRN

## 2016-04-23 MED ORDER — NAPROXEN SODIUM 220 MG PO TABS: ORAL_TABLET | ORAL | Status: DC

## 2016-04-23 NOTE — Progress Notes (Signed)
Patient ID: Frances Holland, female   DOB: Dec 25, 1928, 82 y.o.   MRN: [id number]    [facility]     Place of Service: Clinic (12)     Allergies  Allergen Reactions  . Sulfa Antibiotics   . Sulfamethoxazole Rash    Chief Complaint  Patient presents with  . Acute Visit    left shoulder pain for one week. 6 weeks ago had left shoulder pain got Ketordac 74m one every 6 hours (Dr. ASheppard Coil) alternating with Tylenol. This is the shoulder that lymp nodes were remove,2000, left mastectomy breast cancer.      HPI:  Patient began having pain in the left trapezoid and shoulder area about 6 weeks ago she was prescribed ketorolac by Dr. ASheppard Coil She only had to use 6 tablets taken 4 times daily before the pain was relieved on the first "attack". She did well for a few weeks but then a few days ago began having exquisite pain in the trapezoid and left shoulder area. Ketorolac does not seem to be as effective as in the past.  Patient has chronic lymphedema of the left arm related to surgery for breast cancer many years ago. Patient and husband expressed concerns that previous nodal dissection as part of the surgery could be related to current pains.  Medications: Patient's Medications  New Prescriptions   No medications on file  Previous Medications   AMLODIPINE (NORVASC) 2.5 MG TABLET    Take one tablet by mouth once daily for blood pressure   ASPIRIN 81 MG TABLET    Take 81 mg by mouth daily. Take one tablet at bedtime   CALCIUM CITRATE-VITAMIN D (CALCIUM CITRATE + D PO)    Take 630 mg by mouth. With 500iu D one tablet daily in morning   CHOLECALCIFEROL (VITAMIN D) 2000 UNITS TABLET    Take by mouth. Take 1,000 units in evening   DIPHENHYDRAMINE-ACETAMINOPHEN (TYLENOL PM) 25-500 MG TABS    Take 1 tablet by mouth. Take one at bedtime   LEVOTHYROXINE (SYNTHROID, LEVOTHROID) 75 MCG TABLET    Take one tablet by mouth once daily 30 minutes before breakfast for thyroid   LOSARTAN-HYDROCHLOROTHIAZIDE (HYZAAR) 100-12.5 MG PER TABLET    Take one tablet by mouth once daily for blood pressure, in morning   MULTIPLE VITAMINS-MINERALS (CENTRUM SILVER PO)    Take by mouth. Take one tablet daily in morning   OMEGA-3 FATTY ACIDS (FISH OIL) 1200 MG CAPS    Take by mouth. Take one tablet daily, in morning  Modified Medications   No medications on file  Discontinued Medications   No medications on file     Review of Systems  Constitutional: Negative.  Negative for fever, chills, diaphoresis, activity change, appetite change, fatigue and unexpected weight change.  HENT: Negative for congestion, ear discharge, ear pain, hearing loss, postnasal drip, rhinorrhea, sore throat, tinnitus, trouble swallowing and voice change.   Eyes: Negative for pain, redness, itching and visual disturbance.       Corrective lenses. Dry eyes.  Respiratory: Negative for cough (thin yelow sputum), choking, chest tightness, shortness of breath and wheezing.   Cardiovascular: Negative for chest pain, palpitations and leg swelling.       History of hypertension.  Gastrointestinal: Negative for nausea, abdominal pain, diarrhea, constipation and abdominal distention.       Flatulence. History of hemorrhoids. Sensations of having to burp.  Endocrine: Negative for cold intolerance, heat intolerance, polydipsia, polyphagia and polyuria.  History of hypothyroidism  Genitourinary: Positive for frequency. Negative for dysuria, urgency, hematuria, flank pain, vaginal discharge, difficulty urinating and pelvic pain.       History of hysterectomy and bilateral salpingo-oophorectomy.  Musculoskeletal: Positive for myalgias, back pain and arthralgias. Negative for gait problem.       History of leg cramps. Burning at the right knee that was replaced.  Pain in the left trapezius and anteriorly in the left shoulder. History of surgery to the left breast for cancer. Had nodal dissection and subsequent  lymphedema of the left arm/\.  Skin: Negative for color change, pallor and rash.  Allergic/Immunologic: Negative.   Neurological: Negative for dizziness, tremors, seizures, syncope, weakness, numbness and headaches.       History migraine headaches.  Hematological: Negative for adenopathy. Does not bruise/bleed easily.  Psychiatric/Behavioral: Negative for suicidal ideas, hallucinations, behavioral problems, confusion, sleep disturbance, dysphoric mood and agitation. The patient is not nervous/anxious and is not hyperactive.     Filed Vitals:   04/23/16 1148  BP: 134/80  Pulse: 69  Temp: 97.9 F (36.6 C)  TempSrc: Oral  Height: _0  (1.651 m)  Weight: 164 lb (74.39 kg)  SpO2: 97%   Wt Readings from Last 3 Encounters:  04/23/16 164 lb (74.39 kg)  01/23/16 161 lb (73.029 kg)  01/09/16 161 lb 9.6 oz (73.301 kg)    Body mass index is 27.29 kg/(m^2).  Physical Exam  Constitutional: She is oriented to person, place, and time. She appears well-developed and well-nourished. No distress.  HENT:  Right Ear: External ear normal.  Left Ear: External ear normal.  Nose: Nose normal.  Mouth/Throat: Oropharynx is clear and moist. No oropharyngeal exudate.  Eyes: Conjunctivae and EOM are normal. Pupils are equal, round, and reactive to light. No scleral icterus.  Neck: No JVD present. No tracheal deviation present. No thyromegaly present.  Cardiovascular: Normal rate, regular rhythm, normal heart sounds and intact distal pulses.  Exam reveals no gallop and no friction rub.   No murmur heard. Pulmonary/Chest: Effort normal. No respiratory distress. She has no wheezes. She has no rales. She exhibits no tenderness.  Partial left mastectomy. Scars from breast reduction surgery of the right breast.  Abdominal: She exhibits no distension and no mass. There is no tenderness.  Genitourinary:  Absent cervix. Absent ovaries. Mild cystocele. Stool heme-negative.  Musculoskeletal: Normal range of  motion. She exhibits tenderness (Both knees). She exhibits no edema.  Lymphedema left arm status post mastectomy. Very tender at the left trapezius about midway between the neck and shoulder. Very tender anterior shoulder area with palpation around the coracoid process.  Lymphadenopathy:    She has no cervical adenopathy.  Neurological: She is alert and oriented to person, place, and time. No cranial nerve deficit. Coordination normal.  05/15/2015 MMSE 26/30. Passed clock drawing.  Skin: No rash noted. She is not diaphoretic. No erythema. No pallor.  Scars left and right breasts and right knee.  Psychiatric: She has a normal mood and affect. Her behavior is normal. Judgment and thought content normal.     Labs reviewed: Lab Summary Latest Ref Rng 11/06/2015 04/12/2014  Hemoglobin 12.0 - 16.0 g/dL (None) 12.9  Hematocrit 36 - 46 % (None) 40  White count - (None) 5.8  Platelet count 150 - 399 K/L (None) 333  Sodium 137 - 147 mmol/L 139 135(A)  Potassium 3.4 - 5.3 mmol/L 4.4 4.3  Calcium - (None) (None)  Phosphorus - (None) (None)  Creatinine 0.5 - 1.1 mg/dL  0.8 0.9  AST 13 - 35 U/L 19 19  Alk Phos 25 - 125 U/L 65 70  Bilirubin - (None) (None)  Glucose - 83 94  Cholesterol 0 - 200 mg/dL 204(A) 207(A)  HDL cholesterol 35 - 70 mg/dL 47 48  Triglycerides 40 - 160 mg/dL 221(A) 205(A)  LDL Direct - (None) (None)  LDL Calc - 113 118  Total protein - (None) (None)  Albumin - (None) (None)   Lab Results  Component Value Date   TSH 2.95 11/06/2015   Lab Results  Component Value Date   BUN 14 11/06/2015   BUN 19 04/12/2014   Lab Results  Component Value Date   CREATININE 0.8 11/06/2015   CREATININE 0.9 04/12/2014   Assessment/Plan  1. Pain in joint of left shoulder - naproxen sodium (ALEVE) 220 MG tablet; One up to twice daily to help joint and muscle pain.  Dispense: 60 tablet; Refill: 5 - cyclobenzaprine (FLEXERIL) 10 MG tablet; Take 1 tablet (10 mg total) by mouth 3 (three)  times daily as needed for muscle spasms.  Dispense: 50 tablet; Refill: 3 - DG Chest 2 View; Future - DG Shoulder Left; Future -If x-rays do not show contraindicating information, I would refer to physical therapy at [facility].  2. History of breast cancer - DG Chest 2 View; Future

## 2016-05-02 ENCOUNTER — Telehealth: Payer: Self-pay | Admitting: *Deleted

## 2016-05-02 NOTE — Telephone Encounter (Signed)
IMPRESSION CXR:  1.  Minimal left base subsegmental atelectasis and or infiltrate.   2. Cardiomegaly with normal pulmonary vascularity.  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 No acute abnormality.

## 2016-05-02 NOTE — Telephone Encounter (Signed)
Patient husband, [name] called and stated that patient had x-rays done this week and they are wanting the results. Please Advise.

## 2016-05-02 NOTE — Telephone Encounter (Signed)
LMOM to return call.

## 2016-05-03 NOTE — Telephone Encounter (Signed)
Patient husband notified and stated that he would discuss further with Dr. Nyoka Cowden at appointment on Tuesday.

## 2016-05-07 ENCOUNTER — Telehealth: Payer: Self-pay

## 2016-05-07 NOTE — Telephone Encounter (Signed)
Dr. Nyoka Cowden wrote order for physical therapy to evaluate and treat painful left shoulder. Called patient and left message on voice mail about this. Dr. Nyoka Cowden had mention this 04/23/16 office visit, that if the x-ray didn't show any contraindicating information he would refer her to physical therapy. They will check with her insurance, then they will call her to set up appointment. Order given to PT today.

## 2016-05-08 ENCOUNTER — Ambulatory Visit (INDEPENDENT_AMBULATORY_CARE_PROVIDER_SITE_OTHER): Payer: PPO | Admitting: Podiatry

## 2016-05-08 DIAGNOSIS — M779 Enthesopathy, unspecified: Secondary | ICD-10-CM | POA: Diagnosis not present

## 2016-05-08 NOTE — Patient Instructions (Signed)

## 2016-05-09 DIAGNOSIS — I10 Essential (primary) hypertension: Secondary | ICD-10-CM | POA: Diagnosis not present

## 2016-05-09 DIAGNOSIS — E785 Hyperlipidemia, unspecified: Secondary | ICD-10-CM | POA: Diagnosis not present

## 2016-05-09 DIAGNOSIS — E039 Hypothyroidism, unspecified: Secondary | ICD-10-CM | POA: Diagnosis not present

## 2016-05-09 LAB — BASIC METABOLIC PANEL
BUN: 18 mg/dL (ref 4–21)
CREATININE: 0.8 mg/dL (ref 0.5–1.1)
GLUCOSE: 82 mg/dL
POTASSIUM: 4.6 mmol/L (ref 3.4–5.3)
Sodium: 138 mmol/L (ref 137–147)

## 2016-05-09 LAB — HEPATIC FUNCTION PANEL
ALT: 16 U/L (ref 7–35)
AST: 17 U/L (ref 13–35)
Alkaline Phosphatase: 66 U/L (ref 25–125)
Bilirubin, Total: 0.5 mg/dL

## 2016-05-09 LAB — LIPID PANEL
Cholesterol: 199 mg/dL (ref 0–200)
HDL: 56 mg/dL (ref 35–70)
LDL CALC: 114 mg/dL
Triglycerides: 145 mg/dL (ref 40–160)

## 2016-05-09 LAB — TSH: TSH: 1.87 u[IU]/mL (ref 0.41–5.90)

## 2016-05-10 NOTE — Progress Notes (Signed)
Subjective:     Patient ID: Frances Holland, female   DOB: 09/09/1929, 82 y.o.   MRN: [id number]  HPI patient states she has modestly improved and is looking forward to her orthotics   Review of Systems     Objective:   Physical Exam Neurovascular status unchanged with no other change in health history and patient's forefoot pain seems to have reduced quite a bit    Assessment:     Improved from inflammatory condition    Plan:     Instructed on orthotics and gradual increase in activity levels and reappoint for [place] to reevaluate

## 2016-05-14 ENCOUNTER — Non-Acute Institutional Stay: Payer: PPO | Admitting: Internal Medicine

## 2016-05-14 ENCOUNTER — Encounter: Payer: Self-pay | Admitting: Internal Medicine

## 2016-05-14 VITALS — BP 156/90 | HR 72 | Temp 97.5°F | Ht 65.0 in | Wt 161.0 lb

## 2016-05-14 DIAGNOSIS — I10 Essential (primary) hypertension: Secondary | ICD-10-CM | POA: Diagnosis not present

## 2016-05-14 DIAGNOSIS — E785 Hyperlipidemia, unspecified: Secondary | ICD-10-CM | POA: Diagnosis not present

## 2016-05-14 DIAGNOSIS — G4700 Insomnia, unspecified: Secondary | ICD-10-CM

## 2016-05-14 DIAGNOSIS — E039 Hypothyroidism, unspecified: Secondary | ICD-10-CM | POA: Diagnosis not present

## 2016-05-14 DIAGNOSIS — E041 Nontoxic single thyroid nodule: Secondary | ICD-10-CM

## 2016-05-14 DIAGNOSIS — R413 Other amnesia: Secondary | ICD-10-CM | POA: Diagnosis not present

## 2016-05-14 DIAGNOSIS — Z853 Personal history of malignant neoplasm of breast: Secondary | ICD-10-CM

## 2016-05-14 NOTE — Progress Notes (Signed)
Patient ID: Frances Holland, female   DOB: Jun 22, 1929, 82 y.o.   MRN: [id number]  MMSE 29/30 failed clock drawing

## 2016-05-14 NOTE — Progress Notes (Signed)
Patient ID: Frances Holland, female   DOB: July 05, 1929, 82 y.o.   MRN: [id number]    [facility]     Place of Service: Clinic (12)     Allergies  Allergen Reactions  . Sulfa Antibiotics   . Sulfamethoxazole Rash    Chief Complaint  Patient presents with  . Annual Exam    Wellness exam  . Medical Management of Chronic Issues    blood pressure, thyroid, cholesterol, memory, review labs.  Marland Kitchen MMSE    29/30 failed clock drawing    HPI:  Seen 04/23/16 for pain in the left shoulder.Not bothering her today. Still has some trouble laying down at night.  Essential hypertension - mild elevation in SBP  Hypothyroidism, unspecified hypothyroidism type - compensated   Hyperlipemia - controlled  Memory impairment - mild    Medications: Patient's Medications  New Prescriptions   No medications on file  Previous Medications   AMLODIPINE (NORVASC) 2.5 MG TABLET    Take one tablet by mouth once daily for blood pressure   ASPIRIN 325 MG TABLET    Take 325 mg by mouth daily.   CALCIUM CITRATE-VITAMIN D (CALCIUM CITRATE + D PO)    Take 630 mg by mouth. With 500iu D one tablet daily in morning   CHOLECALCIFEROL (VITAMIN D) 2000 UNITS TABLET    Take by mouth. Take 1,000 units in evening   CYCLOBENZAPRINE (FLEXERIL) 10 MG TABLET    Take 1 tablet (10 mg total) by mouth 3 (three) times daily as needed for muscle spasms.   LEVOTHYROXINE (SYNTHROID, LEVOTHROID) 75 MCG TABLET    Take one tablet by mouth once daily 30 minutes before breakfast for thyroid   LOSARTAN-HYDROCHLOROTHIAZIDE (HYZAAR) 100-12.5 MG PER TABLET    Take one tablet by mouth once daily for blood pressure, in morning   MELATONIN 3 MG TABS    Take by mouth. Take one tablet at bedtime for rest   MULTIPLE VITAMINS-MINERALS (CENTRUM SILVER PO)    Take by mouth. Take one tablet daily in morning   NAPROXEN SODIUM (ALEVE) 220 MG TABLET    One up to twice daily to help joint and muscle pain.   OMEGA-3 FATTY ACIDS (FISH OIL)  1200 MG CAPS    Take by mouth. Take one tablet daily, in morning  Modified Medications   No medications on file  Discontinued Medications   ASPIRIN 81 MG TABLET    Take 81 mg by mouth daily. Take one tablet at bedtime   DIPHENHYDRAMINE-ACETAMINOPHEN (TYLENOL PM) 25-500 MG TABS    Take 1 tablet by mouth. Take one at bedtime     Review of Systems  Constitutional: Negative.  Negative for fever, chills, diaphoresis, activity change, appetite change, fatigue and unexpected weight change.  HENT: Negative for congestion, ear discharge, ear pain, hearing loss, postnasal drip, rhinorrhea, sore throat, tinnitus, trouble swallowing and voice change.   Eyes: Negative for pain, redness, itching and visual disturbance.       Corrective lenses. Dry eyes.  Respiratory: Negative for cough (thin yelow sputum), choking, chest tightness, shortness of breath and wheezing.   Cardiovascular: Negative for chest pain, palpitations and leg swelling.       History of hypertension.  Gastrointestinal: Negative for nausea, abdominal pain, diarrhea, constipation and abdominal distention.       Flatulence. History of hemorrhoids. Sensations of having to burp.  Endocrine: Negative for cold intolerance, heat intolerance, polydipsia, polyphagia and polyuria.       History of hypothyroidism  Genitourinary: Positive for frequency. Negative for dysuria, urgency, hematuria, flank pain, vaginal discharge, difficulty urinating and pelvic pain.       History of hysterectomy and bilateral salpingo-oophorectomy.  Musculoskeletal: Positive for myalgias, back pain and arthralgias. Negative for gait problem.       History of leg cramps. Burning at the right knee that was replaced.  Pain in the left trapezius and anteriorly in the left shoulder. History of surgery to the left breast for cancer. Had nodal dissection and subsequent lymphedema of the left arm/\.  Skin: Negative for color change, pallor and rash.  Allergic/Immunologic:  Negative.   Neurological: Negative for dizziness, tremors, seizures, syncope, weakness, numbness and headaches.       History migraine headaches.  Hematological: Negative for adenopathy. Does not bruise/bleed easily.  Psychiatric/Behavioral: Negative for suicidal ideas, hallucinations, behavioral problems, confusion, sleep disturbance, dysphoric mood and agitation. The patient is not nervous/anxious and is not hyperactive.     Filed Vitals:   05/14/16 1044  BP: 156/90  Pulse: 72  Temp: 97.5 F (36.4 C)  TempSrc: Oral  Height: 5' 5"  (1.651 m)  Weight: 161 lb (73.029 kg)  SpO2: 97%   Wt Readings from Last 3 Encounters:  05/14/16 161 lb (73.029 kg)  04/23/16 164 lb (74.39 kg)  01/23/16 161 lb (73.029 kg)    Body mass index is 26.79 kg/(m^2).  Physical Exam  Constitutional: She is oriented to person, place, and time. She appears well-developed and well-nourished. No distress.  HENT:  Right Ear: External ear normal.  Left Ear: External ear normal.  Nose: Nose normal.  Mouth/Throat: Oropharynx is clear and moist. No oropharyngeal exudate.  Eyes: Conjunctivae and EOM are normal. Pupils are equal, round, and reactive to light. No scleral icterus.  Neck: No JVD present. No tracheal deviation present. No thyromegaly present.  1 cm thyroid nodule lower right pole  Cardiovascular: Normal rate, regular rhythm, normal heart sounds and intact distal pulses.  Exam reveals no gallop and no friction rub.   No murmur heard. Pulmonary/Chest: Effort normal. No respiratory distress. She has no wheezes. She has no rales. She exhibits no tenderness.  Partial left mastectomy. Scars from breast reduction surgery of the right breast.  Abdominal: She exhibits no distension and no mass. There is no tenderness.  Genitourinary:  Absent cervix. Absent ovaries. Mild cystocele. Stool heme-negative.  Musculoskeletal: Normal range of motion. She exhibits tenderness (Both knees). She exhibits no edema.    Lymphedema left arm status post mastectomy.  Lymphadenopathy:    She has no cervical adenopathy.  Neurological: She is alert and oriented to person, place, and time. No cranial nerve deficit. Coordination normal.  05/15/2015 MMSE 26/30. Passed clock drawing. 05/14/16 MMSE 29/30. Failed clock drawing.  Skin: No rash noted. She is not diaphoretic. No erythema. No pallor.  Scars left and right breasts and right knee.  Psychiatric: She has a normal mood and affect. Her behavior is normal. Judgment and thought content normal.     Labs reviewed: Lab Summary Latest Ref Rng 05/09/2016 11/06/2015 04/12/2014  Hemoglobin 12.0 - 16.0 g/dL (None) (None) 12.9  Hematocrit 36 - 46 % (None) (None) 40  White count - (None) (None) 5.8  Platelet count 150 - 399 K/L (None) (None) 333  Sodium 137 - 147 mmol/L 138 139 135(A)  Potassium 3.4 - 5.3 mmol/L 4.6 4.4 4.3  Calcium - (None) (None) (None)  Phosphorus - (None) (None) (None)  Creatinine 0.5 - 1.1 mg/dL 0.8 0.8 0.9  AST 13 -  35 U/L 17 19 19   Alk Phos 25 - 125 U/L 66 65 70  Bilirubin - (None) (None) (None)  Glucose - 82 83 94  Cholesterol 0 - 200 mg/dL 199 204(A) 207(A)  HDL cholesterol 35 - 70 mg/dL 56 47 48  Triglycerides 40 - 160 mg/dL 145 221(A) 205(A)  LDL Direct - (None) (None) (None)  LDL Calc - 114 113 118  Total protein - (None) (None) (None)  Albumin - (None) (None) (None)   Lab Results  Component Value Date   TSH 1.87 05/09/2016   Lab Results  Component Value Date   BUN 18 05/09/2016   BUN 14 11/06/2015   BUN 19 04/12/2014   Lab Results  Component Value Date   CREATININE 0.8 05/09/2016   CREATININE 0.8 11/06/2015   CREATININE 0.9 04/12/2014   No results found for: HGBA1C     Assessment/Plan  1. Essential hypertension I have elected to observe the blood pressure. Today's evaluation systolic blood pressure is normal for the patient. She is feeling a little tense as well.  2. Hypothyroidism, unspecified hypothyroidism  type Compensated  3. Hyperlipemia Controlled  4. Memory impairment Mild impairment noted. I think it is too soon for medication.  5. Insomnia Using melatonin  6. History of breast cancer - MM Digital Diagnostic Bilat; Future  7. Thyroid nodule - US THYROID; Future

## 2016-05-17 ENCOUNTER — Other Ambulatory Visit: Payer: Self-pay | Admitting: Internal Medicine

## 2016-05-17 DIAGNOSIS — N644 Mastodynia: Secondary | ICD-10-CM

## 2016-05-17 DIAGNOSIS — Z1231 Encounter for screening mammogram for malignant neoplasm of breast: Secondary | ICD-10-CM

## 2016-05-23 ENCOUNTER — Ambulatory Visit
Admission: RE | Admit: 2016-05-23 | Discharge: 2016-05-23 | Disposition: A | Discharge status: Home / Self Care | Payer: PPO | Source: Ambulatory Visit | Attending: Internal Medicine | Admitting: Internal Medicine

## 2016-05-23 ENCOUNTER — Other Ambulatory Visit: Payer: Self-pay

## 2016-05-23 DIAGNOSIS — E042 Nontoxic multinodular goiter: Secondary | ICD-10-CM

## 2016-05-23 DIAGNOSIS — E041 Nontoxic single thyroid nodule: Secondary | ICD-10-CM

## 2016-05-24 ENCOUNTER — Telehealth: Payer: Self-pay | Admitting: *Deleted

## 2016-05-24 ENCOUNTER — Other Ambulatory Visit: Payer: Self-pay | Admitting: Internal Medicine

## 2016-05-24 DIAGNOSIS — Z719 Counseling, unspecified: Secondary | ICD-10-CM

## 2016-05-24 DIAGNOSIS — E041 Nontoxic single thyroid nodule: Secondary | ICD-10-CM

## 2016-05-24 NOTE — Telephone Encounter (Signed)
Anderson Malta with [facility] called and stated that they needed a new order placed for Thyroid biopsy due to the referral placed was for a consultation not biopsy.  New order placed.

## 2016-06-04 ENCOUNTER — Other Ambulatory Visit (HOSPITAL_COMMUNITY)
Admission: RE | Admit: 2016-06-04 | Discharge: 2016-06-04 | Disposition: A | Discharge status: Home / Self Care | Payer: PPO | Source: Ambulatory Visit | Attending: Radiology | Admitting: Radiology

## 2016-06-04 ENCOUNTER — Ambulatory Visit
Admission: RE | Admit: 2016-06-04 | Discharge: 2016-06-04 | Disposition: A | Discharge status: Home / Self Care | Payer: PPO | Source: Ambulatory Visit | Attending: Internal Medicine | Admitting: Internal Medicine

## 2016-06-04 DIAGNOSIS — E042 Nontoxic multinodular goiter: Secondary | ICD-10-CM | POA: Insufficient documentation

## 2016-06-04 DIAGNOSIS — Z719 Counseling, unspecified: Secondary | ICD-10-CM

## 2016-06-04 DIAGNOSIS — E041 Nontoxic single thyroid nodule: Secondary | ICD-10-CM | POA: Diagnosis not present

## 2016-06-06 ENCOUNTER — Other Ambulatory Visit: Payer: Self-pay | Admitting: Internal Medicine

## 2016-06-06 DIAGNOSIS — D34 Benign neoplasm of thyroid gland: Secondary | ICD-10-CM | POA: Insufficient documentation

## 2016-06-18 ENCOUNTER — Ambulatory Visit: Payer: PPO | Admitting: *Deleted

## 2016-06-18 DIAGNOSIS — M779 Enthesopathy, unspecified: Secondary | ICD-10-CM

## 2016-06-18 NOTE — Progress Notes (Signed)
Patient ID: Frances Holland, female   DOB: 1929-03-20, 82 y.o.   MRN: [id number] Patient presents stating that she feels a gap between her arch and the orthotics.  After examining there is a slight gap in the arch we will send the orthotics back to have the arch increased slightly.  We will call when they return.

## 2016-06-27 ENCOUNTER — Ambulatory Visit: Payer: Self-pay | Admitting: Surgery

## 2016-06-27 DIAGNOSIS — E041 Nontoxic single thyroid nodule: Secondary | ICD-10-CM | POA: Diagnosis not present

## 2016-07-02 ENCOUNTER — Ambulatory Visit
Admission: RE | Admit: 2016-07-02 | Discharge: 2016-07-02 | Disposition: A | Discharge status: Home / Self Care | Payer: PPO | Source: Ambulatory Visit | Attending: Internal Medicine | Admitting: Internal Medicine

## 2016-07-02 DIAGNOSIS — Z1231 Encounter for screening mammogram for malignant neoplasm of breast: Secondary | ICD-10-CM | POA: Diagnosis not present

## 2016-07-03 ENCOUNTER — Ambulatory Visit: Payer: PPO

## 2016-07-05 ENCOUNTER — Other Ambulatory Visit: Payer: Self-pay | Admitting: Internal Medicine

## 2016-07-05 ENCOUNTER — Ambulatory Visit: Payer: PPO | Admitting: *Deleted

## 2016-07-05 DIAGNOSIS — M779 Enthesopathy, unspecified: Secondary | ICD-10-CM

## 2016-07-05 NOTE — Patient Instructions (Signed)

## 2016-07-05 NOTE — Progress Notes (Signed)
Patient presents for fitting of adjusted orthotics.  Verbal and written break in and wear instructions given.  Patient will follow up in 4 weeks if symptoms worsen or fail to improve.

## 2016-07-10 DIAGNOSIS — E041 Nontoxic single thyroid nodule: Secondary | ICD-10-CM | POA: Diagnosis not present

## 2016-07-16 ENCOUNTER — Encounter (HOSPITAL_COMMUNITY): Payer: Self-pay | Admitting: *Deleted

## 2016-07-29 ENCOUNTER — Encounter: Payer: Self-pay | Admitting: Internal Medicine

## 2016-08-06 NOTE — Patient Instructions (Signed)
Frances Holland  08/06/2016   Your procedure is scheduled on: 08/14/2016   Report to [facility]  Entrance take [place]  elevators to 3rd floor to  [place] at    [place] AM.  Call this number if you have problems the morning of surgery [phone]   Remember: ONLY 1 PERSON MAY GO WITH YOU TO SHORT STAY TO GET  READY MORNING OF [place].  Do not eat food or drink liquids :After Midnight.     Take these medicines the morning of surgery with A SIP OF WATER: amlodipine ( NOrvasc), Synthroid                                 You may not have any metal on your body including hair pins and              piercings  Do not wear jewelry, make-up, lotions, powders or perfumes, deodorant             Do not wear nail polish.  Do not shave  48 hours prior to surgery.                 Do not bring valuables to the hospital. [place].  Contacts, dentures or bridgework may not be worn into surgery.  Leave suitcase in the car. After surgery it may be brought to your room.        Special Instructions: coughing and deep breathing exercises, leg exercises               Please read over the following fact sheets you were given: _____________________________________________________________________             [facility] - Preparing for Surgery Before surgery, you can play an important role.  Because skin is not sterile, your skin needs to be as free of germs as possible.  You can reduce the number of germs on your skin by washing with CHG (chlorahexidine gluconate) soap before surgery.  CHG is an antiseptic cleaner which kills germs and bonds with the skin to continue killing germs even after washing. Please DO NOT use if you have an allergy to CHG or antibacterial soaps.  If your skin becomes reddened/irritated stop using the CHG and inform your nurse when you arrive at Short Stay. Do not shave (including legs and  underarms) for at least 48 hours prior to the first CHG shower.  You may shave your face/neck. Please follow these instructions carefully:  1.  Shower with CHG Soap the night before surgery and the  morning of Surgery.  2.  If you choose to wash your hair, wash your hair first as usual with your  normal  shampoo.  3.  After you shampoo, rinse your hair and body thoroughly to remove the  shampoo.                           4.  Use CHG as you would any other liquid soap.  You can apply chg directly  to the skin and wash  Gently with a scrungie or clean washcloth.  5.  Apply the CHG Soap to your body ONLY FROM THE NECK DOWN.   Do not use on face/ open                           Wound or open sores. Avoid contact with eyes, ears mouth and genitals (private parts).                       Wash face,  Genitals (private parts) with your normal soap.             6.  Wash thoroughly, paying special attention to the area where your surgery  will be performed.  7.  Thoroughly rinse your body with warm water from the neck down.  8.  DO NOT shower/wash with your normal soap after using and rinsing off  the CHG Soap.                9.  Pat yourself dry with a clean towel.            10.  Wear clean pajamas.            11.  Place clean sheets on your bed the night of your first shower and do not  sleep with pets. Day of Surgery : Do not apply any lotions/deodorants the morning of surgery.  Please wear clean clothes to the hospital/surgery center.  FAILURE TO FOLLOW THESE INSTRUCTIONS MAY RESULT IN THE CANCELLATION OF YOUR SURGERY PATIENT SIGNATURE_________________________________  NURSE SIGNATURE__________________________________  ________________________________________________________________________

## 2016-08-08 ENCOUNTER — Ambulatory Visit (HOSPITAL_COMMUNITY)
Admission: RE | Admit: 2016-08-08 | Discharge: 2016-08-08 | Disposition: A | Discharge status: Home / Self Care | Payer: PPO | Source: Ambulatory Visit | Attending: Anesthesiology | Admitting: Anesthesiology

## 2016-08-08 ENCOUNTER — Encounter (HOSPITAL_COMMUNITY)
Admission: RE | Admit: 2016-08-08 | Discharge: 2016-08-08 | Disposition: A | Discharge status: Home / Self Care | Payer: PPO | Source: Ambulatory Visit | Attending: Surgery | Admitting: Surgery

## 2016-08-08 ENCOUNTER — Encounter (HOSPITAL_COMMUNITY): Payer: Self-pay

## 2016-08-08 DIAGNOSIS — Z01818 Encounter for other preprocedural examination: Secondary | ICD-10-CM | POA: Diagnosis not present

## 2016-08-08 DIAGNOSIS — R918 Other nonspecific abnormal finding of lung field: Secondary | ICD-10-CM | POA: Insufficient documentation

## 2016-08-08 DIAGNOSIS — I517 Cardiomegaly: Secondary | ICD-10-CM | POA: Insufficient documentation

## 2016-08-08 DIAGNOSIS — I10 Essential (primary) hypertension: Secondary | ICD-10-CM | POA: Diagnosis not present

## 2016-08-08 HISTORY — DX: Other infective bursitis, left shoulder: M71.112

## 2016-08-08 HISTORY — DX: Malignant (primary) neoplasm, unspecified: C80.1

## 2016-08-08 HISTORY — DX: Other amnesia: R41.3

## 2016-08-08 LAB — BASIC METABOLIC PANEL
ANION GAP: 7 (ref 5–15)
BUN: 20 mg/dL (ref 6–20)
CHLORIDE: 99 mmol/L — AB (ref 101–111)
CO2: 28 mmol/L (ref 22–32)
Calcium: 9.7 mg/dL (ref 8.9–10.3)
Creatinine, Ser: 0.73 mg/dL (ref 0.44–1.00)
GFR calc non Af Amer: >60 mL/min (ref >60)
Glucose, Bld: 89 mg/dL (ref 65–99)
POTASSIUM: 4.1 mmol/L (ref 3.5–5.1)
Sodium: 134 mmol/L — ABNORMAL LOW (ref 135–145)

## 2016-08-08 LAB — CBC
HCT: 38.4 % (ref 36.0–46.0)
HEMOGLOBIN: 13 g/dL (ref 12.0–15.0)
MCH: 29.4 pg (ref 26.0–34.0)
MCHC: 33.9 g/dL (ref 30.0–36.0)
MCV: 86.9 fL (ref 78.0–100.0)
Platelets: 336 10*3/uL (ref 150–400)
RBC: 4.42 MIL/uL (ref 3.87–5.11)
RDW: 13 % (ref 11.5–15.5)
WBC: 5.4 10*3/uL (ref 4.0–10.5)

## 2016-08-08 NOTE — Progress Notes (Signed)
Patient reported on left ring finger a small reddened are underneath ring.  Patient instructed to report to PCP prior to surgery.  Patient stated she would see him on 08/13/2016 to report.

## 2016-08-14 ENCOUNTER — Encounter (HOSPITAL_COMMUNITY)
Admission: RE | Disposition: A | Discharge status: Home / Self Care | Payer: Self-pay | Source: Ambulatory Visit | Attending: Surgery

## 2016-08-14 ENCOUNTER — Encounter (HOSPITAL_COMMUNITY): Payer: Self-pay | Admitting: *Deleted

## 2016-08-14 ENCOUNTER — Ambulatory Visit (HOSPITAL_COMMUNITY): Payer: PPO | Admitting: Anesthesiology

## 2016-08-14 ENCOUNTER — Observation Stay (HOSPITAL_COMMUNITY)
Admission: RE | Admit: 2016-08-14 | Discharge: 2016-08-15 | Disposition: A | Discharge status: Home / Self Care | Payer: PPO | Source: Ambulatory Visit | Attending: Surgery | Admitting: Surgery

## 2016-08-14 DIAGNOSIS — Z7982 Long term (current) use of aspirin: Secondary | ICD-10-CM | POA: Insufficient documentation

## 2016-08-14 DIAGNOSIS — E063 Autoimmune thyroiditis: Secondary | ICD-10-CM | POA: Insufficient documentation

## 2016-08-14 DIAGNOSIS — Z79899 Other long term (current) drug therapy: Secondary | ICD-10-CM | POA: Insufficient documentation

## 2016-08-14 DIAGNOSIS — I10 Essential (primary) hypertension: Secondary | ICD-10-CM | POA: Diagnosis not present

## 2016-08-14 DIAGNOSIS — D497 Neoplasm of unspecified behavior of endocrine glands and other parts of nervous system: Secondary | ICD-10-CM | POA: Diagnosis present

## 2016-08-14 DIAGNOSIS — C73 Malignant neoplasm of thyroid gland: Principal | ICD-10-CM | POA: Insufficient documentation

## 2016-08-14 DIAGNOSIS — E041 Nontoxic single thyroid nodule: Secondary | ICD-10-CM | POA: Diagnosis not present

## 2016-08-14 DIAGNOSIS — Z853 Personal history of malignant neoplasm of breast: Secondary | ICD-10-CM | POA: Insufficient documentation

## 2016-08-14 DIAGNOSIS — M1990 Unspecified osteoarthritis, unspecified site: Secondary | ICD-10-CM | POA: Insufficient documentation

## 2016-08-14 DIAGNOSIS — E039 Hypothyroidism, unspecified: Secondary | ICD-10-CM | POA: Insufficient documentation

## 2016-08-14 DIAGNOSIS — Z9012 Acquired absence of left breast and nipple: Secondary | ICD-10-CM | POA: Insufficient documentation

## 2016-08-14 HISTORY — PX: THYROID LOBECTOMY: SHX420

## 2016-08-14 LAB — CBC
HEMATOCRIT: 38.7 % (ref 36.0–46.0)
Hemoglobin: 13.3 g/dL (ref 12.0–15.0)
MCH: 30.2 pg (ref 26.0–34.0)
MCHC: 34.4 g/dL (ref 30.0–36.0)
MCV: 87.8 fL (ref 78.0–100.0)
PLATELETS: 306 10*3/uL (ref 150–400)
RBC: 4.41 MIL/uL (ref 3.87–5.11)
RDW: 13.1 % (ref 11.5–15.5)
WBC: 16.4 10*3/uL — ABNORMAL HIGH (ref 4.0–10.5)

## 2016-08-14 LAB — CREATININE, SERUM
Creatinine, Ser: 0.84 mg/dL (ref 0.44–1.00)
GFR calc non Af Amer: >60 mL/min (ref >60)

## 2016-08-14 SURGERY — LOBECTOMY, THYROID
Anesthesia: General | Site: Neck | Laterality: Left

## 2016-08-14 MED ORDER — AMLODIPINE BESYLATE 5 MG PO TABS: 2.5000 mg | ORAL_TABLET | Freq: Every day | ORAL | Status: DC

## 2016-08-14 MED ORDER — HYDROMORPHONE HCL 2 MG/ML IJ SOLN
INTRAMUSCULAR | Status: AC
Start: 2016-08-14 — End: 2016-08-14
  Filled 2016-08-14: qty 1

## 2016-08-14 MED ORDER — SUCCINYLCHOLINE CHLORIDE 200 MG/10ML IV SOSY
PREFILLED_SYRINGE | INTRAVENOUS | Status: DC | PRN
  Administered 2016-08-14: 100 mg via INTRAVENOUS

## 2016-08-14 MED ORDER — PROPOFOL 10 MG/ML IV BOLUS
INTRAVENOUS | Status: AC
  Filled 2016-08-14: qty 20

## 2016-08-14 MED ORDER — ROCURONIUM BROMIDE 100 MG/10ML IV SOLN
INTRAVENOUS | Status: AC
  Filled 2016-08-14: qty 1

## 2016-08-14 MED ORDER — MEPERIDINE HCL 50 MG/ML IJ SOLN: 6.2500 mg | INTRAMUSCULAR | Status: DC | PRN

## 2016-08-14 MED ORDER — ROCURONIUM BROMIDE 10 MG/ML (PF) SYRINGE
PREFILLED_SYRINGE | INTRAVENOUS | Status: DC | PRN
  Administered 2016-08-14 (×2): 10 mg via INTRAVENOUS
  Administered 2016-08-14: 40 mg via INTRAVENOUS

## 2016-08-14 MED ORDER — SUGAMMADEX SODIUM 200 MG/2ML IV SOLN
INTRAVENOUS | Status: DC | PRN
  Administered 2016-08-14: 150 mg via INTRAVENOUS

## 2016-08-14 MED ORDER — HYDROCHLOROTHIAZIDE 12.5 MG PO CAPS: 12.5000 mg | ORAL_CAPSULE | Freq: Every day | ORAL | Status: DC

## 2016-08-14 MED ORDER — CEFAZOLIN SODIUM-DEXTROSE 2-4 GM/100ML-% IV SOLN
2.0000 g | INTRAVENOUS | Status: AC
  Administered 2016-08-14: 2 g via INTRAVENOUS
  Filled 2016-08-14: qty 100

## 2016-08-14 MED ORDER — LIDOCAINE 2% (20 MG/ML) 5 ML SYRINGE
INTRAMUSCULAR | Status: DC | PRN
  Administered 2016-08-14: 50 mg via INTRAVENOUS

## 2016-08-14 MED ORDER — PROPOFOL 10 MG/ML IV BOLUS
INTRAVENOUS | Status: DC | PRN
  Administered 2016-08-14: 120 mg via INTRAVENOUS

## 2016-08-14 MED ORDER — LEVOTHYROXINE SODIUM 75 MCG PO TABS
75.0000 ug | ORAL_TABLET | Freq: Every day | ORAL | Status: DC
  Administered 2016-08-15: 75 ug via ORAL
  Filled 2016-08-14: qty 1

## 2016-08-14 MED ORDER — LACTATED RINGERS IV SOLN
INTRAVENOUS | Status: DC | PRN
  Administered 2016-08-14 (×2): via INTRAVENOUS

## 2016-08-14 MED ORDER — HEPARIN SODIUM (PORCINE) 5000 UNIT/ML IJ SOLN
5000.0000 [IU] | Freq: Once | INTRAMUSCULAR | Status: AC
  Administered 2016-08-14: 5000 [IU] via SUBCUTANEOUS
  Filled 2016-08-14: qty 1

## 2016-08-14 MED ORDER — LIDOCAINE 2% (20 MG/ML) 5 ML SYRINGE
INTRAMUSCULAR | Status: AC
  Filled 2016-08-14: qty 5

## 2016-08-14 MED ORDER — CHLORHEXIDINE GLUCONATE CLOTH 2 % EX PADS: 6.0000 | MEDICATED_PAD | Freq: Once | CUTANEOUS | Status: DC

## 2016-08-14 MED ORDER — SUGAMMADEX SODIUM 200 MG/2ML IV SOLN
INTRAVENOUS | Status: AC
  Filled 2016-08-14: qty 2

## 2016-08-14 MED ORDER — CEFAZOLIN SODIUM-DEXTROSE 2-4 GM/100ML-% IV SOLN
INTRAVENOUS | Status: AC
  Filled 2016-08-14: qty 100

## 2016-08-14 MED ORDER — LOSARTAN POTASSIUM-HCTZ 100-12.5 MG PO TABS: 1.0000 | ORAL_TABLET | Freq: Every day | ORAL | Status: DC

## 2016-08-14 MED ORDER — MORPHINE SULFATE (PF) 2 MG/ML IV SOLN
1.0000 mg | INTRAVENOUS | Status: DC | PRN
  Filled 2016-08-14 (×2): qty 1

## 2016-08-14 MED ORDER — EPHEDRINE SULFATE-NACL 50-0.9 MG/10ML-% IV SOSY
PREFILLED_SYRINGE | INTRAVENOUS | Status: DC | PRN
  Administered 2016-08-14: 5 mg via INTRAVENOUS
  Administered 2016-08-14: 10 mg via INTRAVENOUS
  Administered 2016-08-14: 5 mg via INTRAVENOUS

## 2016-08-14 MED ORDER — 0.9 % SODIUM CHLORIDE (POUR BTL) OPTIME
TOPICAL | Status: DC | PRN
  Administered 2016-08-14: 1000 mL

## 2016-08-14 MED ORDER — LACTATED RINGERS IV SOLN: INTRAVENOUS | Status: DC

## 2016-08-14 MED ORDER — FENTANYL CITRATE (PF) 100 MCG/2ML IJ SOLN
INTRAMUSCULAR | Status: AC
  Filled 2016-08-14: qty 2

## 2016-08-14 MED ORDER — KCL IN DEXTROSE-NACL 20-5-0.45 MEQ/L-%-% IV SOLN
INTRAVENOUS | Status: DC
  Administered 2016-08-14: 17:00:00 via INTRAVENOUS
  Filled 2016-08-14 (×2): qty 1000

## 2016-08-14 MED ORDER — HEPARIN SODIUM (PORCINE) 5000 UNIT/ML IJ SOLN
5000.0000 [IU] | Freq: Three times a day (TID) | INTRAMUSCULAR | Status: DC
  Administered 2016-08-15: 5000 [IU] via SUBCUTANEOUS
  Filled 2016-08-14: qty 1

## 2016-08-14 MED ORDER — HYDROMORPHONE HCL 1 MG/ML IJ SOLN
INTRAMUSCULAR | Status: DC | PRN
  Administered 2016-08-14: 0.5 mg via INTRAVENOUS

## 2016-08-14 MED ORDER — EPHEDRINE SULFATE 50 MG/ML IJ SOLN
INTRAMUSCULAR | Status: DC | PRN
  Administered 2016-08-14: 10 mg via INTRAVENOUS

## 2016-08-14 MED ORDER — FENTANYL CITRATE (PF) 100 MCG/2ML IJ SOLN
INTRAMUSCULAR | Status: DC | PRN
  Administered 2016-08-14 (×3): 50 ug via INTRAVENOUS
  Administered 2016-08-14: 100 ug via INTRAVENOUS

## 2016-08-14 MED ORDER — FENTANYL CITRATE (PF) 100 MCG/2ML IJ SOLN
25.0000 ug | INTRAMUSCULAR | Status: DC | PRN
  Administered 2016-08-14: 50 ug via INTRAVENOUS

## 2016-08-14 MED ORDER — FENTANYL CITRATE (PF) 250 MCG/5ML IJ SOLN
INTRAMUSCULAR | Status: AC
  Filled 2016-08-14: qty 5

## 2016-08-14 MED ORDER — ONDANSETRON HCL 4 MG/2ML IJ SOLN
INTRAMUSCULAR | Status: DC | PRN
  Administered 2016-08-14: 4 mg via INTRAVENOUS

## 2016-08-14 MED ORDER — EPHEDRINE 5 MG/ML INJ
INTRAVENOUS | Status: AC
  Filled 2016-08-14: qty 10

## 2016-08-14 MED ORDER — LOSARTAN POTASSIUM 50 MG PO TABS: 100.0000 mg | ORAL_TABLET | Freq: Every day | ORAL | Status: DC

## 2016-08-14 MED ORDER — HYDROCODONE-ACETAMINOPHEN 5-325 MG PO TABS
1.0000 | ORAL_TABLET | ORAL | Status: DC | PRN
  Administered 2016-08-14 – 2016-08-15 (×3): 1 via ORAL
  Filled 2016-08-14 (×3): qty 1

## 2016-08-14 MED ORDER — ONDANSETRON HCL 4 MG/2ML IJ SOLN
INTRAMUSCULAR | Status: AC
  Filled 2016-08-14: qty 2

## 2016-08-14 SURGICAL SUPPLY — 42 items
ATTRACTOMAT 16X20 MAGNETIC DRP (DRAPES) ×2 IMPLANT
BENZOIN TINCTURE PRP APPL 2/3 (GAUZE/BANDAGES/DRESSINGS) ×2 IMPLANT
BLADE HEX COATED 2.75 (ELECTRODE) ×2 IMPLANT
BLADE SURG 15 STRL LF DISP TIS (BLADE) ×1 IMPLANT
BLADE SURG 15 STRL SS (BLADE) ×1
BLADE SURG SZ10 CARB STEEL (BLADE) ×2 IMPLANT
CLIP TI WIDE RED SMALL 6 (CLIP) ×8 IMPLANT
COVER SURGICAL LIGHT HANDLE (MISCELLANEOUS) ×2 IMPLANT
DISSECTOR ROUND CHERRY 3/8 STR (MISCELLANEOUS) ×4 IMPLANT
DRAPE LAPAROTOMY T 98X78 PEDS (DRAPES) ×2 IMPLANT
ELECT NEEDLE TIP 2.8 STRL (NEEDLE) IMPLANT
ELECT PENCIL ROCKER SW 15FT (MISCELLANEOUS) ×2 IMPLANT
ELECT REM PT RETURN 9FT ADLT (ELECTROSURGICAL) ×2
ELECTRODE REM PT RTRN 9FT ADLT (ELECTROSURGICAL) ×1 IMPLANT
GAUZE SPONGE 4X4 12PLY STRL (GAUZE/BANDAGES/DRESSINGS) IMPLANT
GAUZE SPONGE 4X4 16PLY XRAY LF (GAUZE/BANDAGES/DRESSINGS) ×6 IMPLANT
GLOVE BIOGEL M 8.0 STRL (GLOVE) ×2 IMPLANT
GLOVE BIOGEL PI IND STRL 7.0 (GLOVE) ×1 IMPLANT
GLOVE BIOGEL PI INDICATOR 7.0 (GLOVE) ×1
GOWN SPEC L4 XLG W/TWL (GOWN DISPOSABLE) ×2 IMPLANT
GOWN STRL REUS W/TWL LRG LVL3 (GOWN DISPOSABLE) ×2 IMPLANT
GOWN STRL REUS W/TWL XL LVL3 (GOWN DISPOSABLE) ×6 IMPLANT
HEMOSTAT ARISTA ABSORB 3G PWDR (MISCELLANEOUS) ×2 IMPLANT
HEMOSTAT SURGICEL 2X14 (HEMOSTASIS) ×2 IMPLANT
KIT BASIN OR (CUSTOM PROCEDURE TRAY) ×2 IMPLANT
LIGHT WAVEGUIDE WIDE FLAT (MISCELLANEOUS) ×2 IMPLANT
LIQUID BAND (GAUZE/BANDAGES/DRESSINGS) ×2 IMPLANT
NS IRRIG 1000ML POUR BTL (IV SOLUTION) ×2 IMPLANT
PACK BASIC VI WITH GOWN DISP (CUSTOM PROCEDURE TRAY) ×2 IMPLANT
STAPLER VISISTAT 35W (STAPLE) ×2 IMPLANT
STRIP CLOSURE SKIN 1/2X4 (GAUZE/BANDAGES/DRESSINGS) ×2 IMPLANT
SUT MNCRL AB 4-0 PS2 18 (SUTURE) ×2 IMPLANT
SUT SILK 2 0 (SUTURE) ×1
SUT SILK 2 0 SH (SUTURE) ×2 IMPLANT
SUT SILK 2-0 18XBRD TIE 12 (SUTURE) ×1 IMPLANT
SUT SILK 3 0 (SUTURE)
SUT SILK 3-0 18XBRD TIE 12 (SUTURE) IMPLANT
SUT VIC AB 4-0 SH 18 (SUTURE) ×2 IMPLANT
SUT VIC AB 5-0 P-3 18XBRD (SUTURE) IMPLANT
SUT VIC AB 5-0 P3 18 (SUTURE)
SYR BULB IRRIGATION 50ML (SYRINGE) ×2 IMPLANT
YANKAUER SUCT BULB TIP 10FT TU (MISCELLANEOUS) ×2 IMPLANT

## 2016-08-14 NOTE — Anesthesia Preprocedure Evaluation (Signed)
Anesthesia Evaluation  Patient identified by MRN, date of birth, ID band Patient awake    Reviewed: Allergy & Precautions, NPO status , Patient's Chart, lab work & pertinent test results  Airway Mallampati: I  TM Distance: >3 FB Neck ROM: Full    Dental  (+) Teeth Intact, Dental Advisory Given   Pulmonary    breath sounds clear to auscultation       Cardiovascular hypertension, Pt. on medications  Rhythm:Regular Rate:Normal     Neuro/Psych    GI/Hepatic   Endo/Other  Hypothyroidism   Renal/GU      Musculoskeletal   Abdominal   Peds  Hematology   Anesthesia Other Findings   Reproductive/Obstetrics                             Anesthesia Physical Anesthesia Plan  ASA: III  Anesthesia Plan: General   Post-op Pain Management:    Induction: Intravenous  Airway Management Planned: Oral ETT  Additional Equipment:   Intra-op Plan:   Post-operative Plan: Extubation in OR  Informed Consent: I have reviewed the patients History and Physical, chart, labs and discussed the procedure including the risks, benefits and alternatives for the proposed anesthesia with the patient or authorized representative who has indicated his/her understanding and acceptance.   Dental advisory given  Plan Discussed with: CRNA, Anesthesiologist and Surgeon  Anesthesia Plan Comments:         Anesthesia Quick Evaluation

## 2016-08-14 NOTE — Transfer of Care (Signed)
Immediate Anesthesia Transfer of Care Note  Patient: Frances Holland  Procedure(s) Performed: Procedure(s): NEAR TOTAL THYROIDECTOMY (Left)  Patient Location: PACU  Anesthesia Type:General  Level of Consciousness: awake, alert  and patient cooperative  Airway & Oxygen Therapy: Patient Spontanous Breathing and Patient connected to face mask oxygen  Post-op Assessment: Report given to RN and Post -op Vital signs reviewed and stable  Post vital signs: Reviewed and stable  Last Vitals:  Vitals:   08/14/16 0641  BP: (!) 164/74  Pulse: 75  Resp: 16  Temp: 36.9 C    Last Pain:  Vitals:   08/14/16 0641  TempSrc: Oral      Patients Stated Pain Goal: 3 ([id number])  Complications: No apparent anesthesia complications

## 2016-08-14 NOTE — Anesthesia Procedure Notes (Signed)
Procedure Name: Intubation Date/Time: 08/14/2016 8:43 AM Performed by: Anne Fu Pre-anesthesia Checklist: Patient identified, Emergency Drugs available, Suction available, Patient being monitored and Timeout performed Patient Re-evaluated:Patient Re-evaluated prior to inductionOxygen Delivery Method: Circle system utilized Preoxygenation: Pre-oxygenation with 100% oxygen Intubation Type: IV induction Ventilation: Mask ventilation without difficulty Laryngoscope Size: Mac and 4 Grade View: Grade I Tube type: Oral Tube size: 7.5 mm Number of attempts: 1 Airway Equipment and Method: Stylet Placement Confirmation: ETT inserted through vocal cords under direct vision,  positive ETCO2,  CO2 detector and breath sounds checked- equal and bilateral Secured at: 23 cm Tube secured with: Tape Dental Injury: Teeth and Oropharynx as per pre-operative assessment

## 2016-08-14 NOTE — Interval H&P Note (Signed)
History and Physical Interval Note:  08/14/2016 8:18 AM  Frances Holland  has presented today for surgery, with the diagnosis of follicular neoplasm of thyroid  The various methods of treatment have been discussed with the patient and family. After consideration of risks, benefits and other options for treatment, the patient has consented to  Procedure(s): LEFT THYROID LOBECTOMY POSSIBLE NEAR TOTAL THYROIDECTOMY (Left) as a surgical intervention .  The patient's history has been reviewed, patient examined, no change in status, stable for surgery.  I have reviewed the patient's chart and labs.  Questions were answered to the patient's satisfaction.     Carlisha Wisler B

## 2016-08-14 NOTE — Op Note (Signed)
Surgeon: Kaylyn Lim, MD, FACS  Asst:  Gurney Maxin, MD  Anes:  general  Procedure: Near total thyroidectomy  Diagnosis: Follicular neoplasm (Hurthle cell)  Complications: none  EBL:   15 cc  Drains: none  Description of Procedure:  The patient was taken to OR 4 at [facility].  After anesthesia was administered and the patient was prepped a timeout was performed.  A transverse incision was made 2 cm above the notch and carried through the platysma muscle.  Superior and inferior flaps were made and retracted with the [place].  The midline raphe was easy to identify.   The more palpable nodule was on the right side and there was a left upper thyroid nodule.  The initial dissection removed the left thyroid lobe, the isthmus and the right nodule.  This dissection was performed with small clips, the Harmonic scalpel and staying down on the thyroid gland.  The recurrent nerve was visualized and we stayed away from it.  The parathyroid glands were spared by staying on the gland.  The specimen was sent and the frozen section showed that the left thyroid nodule was benign but the one on the right was a Hurthe cell neoplasm.  At that point I felt two other small nodules in the right lobe and we took the superior pole vessels and again stayed on the surface of the gland and removed the remaining thyroid.    On both sides, there was fatty tissue laterally in the tracheoesophageal groove.  Bleeding was controlled and Arista was placed in the areas of resection.  The strap muscles were approximated in the midline.  The platysma was closed with 4-0 vicryl and the incision was closed with 4-0 vicryl and a running 4-0 monocryl.  Liquiban was used on the skin.    The patient tolerated the procedure well and was taken to the PACU in stable condition.     Matt B. Hassell Done, [place], [facility], [place]

## 2016-08-14 NOTE — H&P (Signed)
Frances Holland 06/27/2016 11:07 AM Location: [facility] Patient #: [id number] DOB: 1929/03/20 Married / Language: English / Race: White Female   History of Present Illness Rodman Key B. Hassell Done MD; 06/27/2016 12:19 PM) The patient is a 82 year old female who presents with a thyroid nodule. The patient was referred by a primary care provider (Art Nyoka Cowden). She and her husband moved her several years ago to [facility] from [employer]. 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 Her voice is OK. No palpable adenopathy. US guided biopsy of the left lobe and isthmus. I explained lobectomy and near total thyroidectomy with complications not limited to recurrent nerve damage. She wants to go ahead and have this surgery.   They moved to [facility] where her husband worked at [employer] for [employer].    Other Problems Elbert Ewings, CMA; 06/27/2016 11:07 AM) Arthritis Back Pain Breast Cancer Hemorrhoids High blood pressure Migraine Headache Oophorectomy Bilateral. Thyroid Disease  Past Surgical History Elbert Ewings, CMA; 06/27/2016 11:07 AM) Appendectomy Breast Mass; Local Excision Left. Breast Reconstruction Bilateral. Hysterectomy (not due to cancer) - Complete Knee Surgery Right. Shoulder Surgery Left. Tonsillectomy  Diagnostic Studies History Elbert Ewings, [place]; 06/27/2016 11:07 AM) Colonoscopy 1-5 years ago Mammogram 1-3 years ago Pap Smear 1-5 years ago  Allergies Elbert Ewings, CMA; 06/27/2016 11:08 AM) Sulfa 10 *OPHTHALMIC AGENTS*  Medication History Elbert Ewings, CMA; 06/27/2016 11:10 AM) Losartan Potassium-HCTZ (100-12.5MG  Tablet, Oral) Active. AmLODIPine Besylate (2.5MG  Tablet, Oral) Active. Levothyroxine Sodium (75MCG Tablet, Oral) Active. Fish Oil (1200MG  Capsule, Oral) Active. Calcium Citrate+D3 (315-250MG -UNIT Tablet, Oral) Active. Aspirin (81MG  Tablet,  Oral) Active. Melatonin (1MG  Tablet, Oral) Active. Medications Reconciled  Social History Elbert Ewings, [place]; 06/27/2016 11:07 AM) Caffeine use Coffee, Tea. No alcohol use No drug use Tobacco use Never smoker.  Family History Elbert Ewings, [place]; 06/27/2016 11:07 AM) Arthritis Father, Mother. Hypertension Mother. Migraine Headache Daughter, Mother. Thyroid problems Sister.  Pregnancy / Birth History Elbert Ewings, CMA; 06/27/2016 11:07 AM) Age at menarche 47 years. Age of menopause 74-55 Gravida 3 Length (months) of breastfeeding 3-6 Maternal age 60-25 Para 3    Review of Systems Elbert Ewings CMA; 06/27/2016 11:07 AM) General Not Present- Appetite Loss, Chills, Fatigue, Fever, Night Sweats, Weight Gain and Weight Loss. Skin Present- Change in Wart/Mole and Dryness. Not Present- Hives, Jaundice, New Lesions, Non-Healing Wounds, Rash and Ulcer. HEENT Not Present- Earache, Hearing Loss, Hoarseness, Nose Bleed, Oral Ulcers, Ringing in the Ears, Seasonal Allergies, Sinus Pain, Sore Throat, Visual Disturbances, Wears glasses/contact lenses and Yellow Eyes. Respiratory Not Present- Bloody sputum, Chronic Cough, Difficulty Breathing, Snoring and Wheezing. Breast Not Present- Breast Mass, Breast Pain, Nipple Discharge and Skin Changes. Cardiovascular Present- Leg Cramps. Not Present- Chest Pain, Difficulty Breathing Lying Down, Palpitations, Rapid Heart Rate, Shortness of Breath and Swelling of Extremities. Gastrointestinal Not Present- Abdominal Pain, Bloating, Bloody Stool, Change in Bowel Habits, Chronic diarrhea, Constipation, Difficulty Swallowing, Excessive gas, Gets full quickly at meals, Hemorrhoids, Indigestion, Nausea, Rectal Pain and Vomiting. Female Genitourinary Present- Frequency and Nocturia. Not Present- Painful Urination, Pelvic Pain and Urgency. Musculoskeletal Present- Back Pain. Not Present- Joint Pain, Joint Stiffness, Muscle Pain, Muscle Weakness and Swelling  of Extremities. Neurological Not Present- Decreased Memory, Fainting, Headaches, Numbness, Seizures, Tingling, Tremor, Trouble walking and Weakness. Psychiatric Not Present- Anxiety, Bipolar, Change in Sleep Pattern, Depression, Fearful and Frequent crying. Endocrine Not Present- Cold Intolerance, Excessive Hunger, Hair Changes, Heat Intolerance, Hot flashes  and New Diabetes. Hematology Not Present- Blood Thinners, Easy Bruising, Excessive bleeding, Gland problems, HIV and Persistent Infections.  Vitals Elbert Ewings CMA; 06/27/2016 11:10 AM) 06/27/2016 11:10 AM Weight: 159.6 lb Height: 64in Body Surface Area: 1.78 m Body Mass Index: 27.39 kg/m  Temp.: 97.49F(Temporal)  Pulse: 64 (Regular)  BP: 126/72 (Sitting, Left Arm, Standard)       Physical Exam ([place] B. Hassell Done MD; 06/27/2016 12:22 PM) The physical exam findings are as follows: Note:HEENT glasses Neck soft palpable left thyroid mass; no adenopathy palpated Chest clear; Breast -prior left mastectomy with reconstruction and some lymphedema of the left arm Abdomen prior hysterectomy Extremity left lymphedema Neuro alert and oriented x 3; very functional neurological    Assessment & Plan Rodman Key B. Hassell Done MD; 06/27/2016 12:23 PM) LEFT THYROID NODULE (E04.1) Impression: Will schedule left thyroid lobectomy possible near total thyroidectomy. Thyroid surgery booklet given to her.

## 2016-08-14 NOTE — Anesthesia Postprocedure Evaluation (Signed)
Anesthesia Post Note  Patient: Frances Holland  Procedure(s) Performed: Procedure(s) (LRB): NEAR TOTAL THYROIDECTOMY (Left)  Patient location during evaluation: PACU Anesthesia Type: General Level of consciousness: awake and alert Pain management: pain level controlled Vital Signs Assessment: post-procedure vital signs reviewed and stable Respiratory status: spontaneous breathing, nonlabored ventilation, respiratory function stable and patient connected to nasal cannula oxygen Cardiovascular status: blood pressure returned to baseline and stable Postop Assessment: no signs of nausea or vomiting Anesthetic complications: no    Last Vitals:  Vitals:   08/14/16 1300 08/14/16 1400  BP: (!) 169/76 140/63  Pulse: 83 84  Resp: 16 16  Temp: 36.5 C 36.4 C    Last Pain:  Vitals:   08/14/16 1400  TempSrc: Oral  PainSc:                  Frances Holland A

## 2016-08-15 DIAGNOSIS — C73 Malignant neoplasm of thyroid gland: Secondary | ICD-10-CM | POA: Diagnosis not present

## 2016-08-15 LAB — CBC
HEMATOCRIT: 34 % — AB (ref 36.0–46.0)
HEMOGLOBIN: 11.7 g/dL — AB (ref 12.0–15.0)
MCH: 30.4 pg (ref 26.0–34.0)
MCHC: 34.4 g/dL (ref 30.0–36.0)
MCV: 88.3 fL (ref 78.0–100.0)
Platelets: 278 10*3/uL (ref 150–400)
RBC: 3.85 MIL/uL — AB (ref 3.87–5.11)
RDW: 13.3 % (ref 11.5–15.5)
WBC: 8.7 10*3/uL (ref 4.0–10.5)

## 2016-08-15 LAB — COMPREHENSIVE METABOLIC PANEL
ALBUMIN: 3.4 g/dL — AB (ref 3.5–5.0)
ALK PHOS: 50 U/L (ref 38–126)
ALT: 16 U/L (ref 14–54)
AST: 21 U/L (ref 15–41)
Anion gap: 5 (ref 5–15)
BILIRUBIN TOTAL: 0.6 mg/dL (ref 0.3–1.2)
BUN: 12 mg/dL (ref 6–20)
CO2: 27 mmol/L (ref 22–32)
CREATININE: 0.69 mg/dL (ref 0.44–1.00)
Calcium: 8.6 mg/dL — ABNORMAL LOW (ref 8.9–10.3)
Chloride: 101 mmol/L (ref 101–111)
GFR calc Af Amer: >60 mL/min (ref >60)
GLUCOSE: 120 mg/dL — AB (ref 65–99)
Potassium: 4.1 mmol/L (ref 3.5–5.1)
Sodium: 133 mmol/L — ABNORMAL LOW (ref 135–145)
TOTAL PROTEIN: 6.2 g/dL — AB (ref 6.5–8.1)

## 2016-08-15 MED ORDER — HYDROCODONE-ACETAMINOPHEN 5-325 MG PO TABS: 1.0000 | ORAL_TABLET | ORAL | 0 refills | Status: DC | PRN

## 2016-08-15 NOTE — Discharge Summary (Signed)
Physician Discharge Summary  Patient ID: Frances Holland MRN: [id number] DOB/AGE: Nov 22, 1929 82 y.o.  Admit date: 08/14/2016 Discharge date: 08/15/2016  Admission Diagnoses:  Follicular neoplasm of left lobe  Discharge Diagnoses:  Follicular neoplasm of right lobe  Active Problems:   Follicular neoplasm of thyroid   Surgery:  Near total thyroidectomy  Discharged Condition: stable   Hospital Course:   Had operation staged according to frozen sections.  As a result, she underwent near total thyroidectomy.  She tolerated the procedure well.  Advised to contact Dr. Nyoka Cowden post discharge to probably increase her dose of synthroid.    Consults: none  Significant Diagnostic Studies: final path to rule out cancer pending.  As of now, the right lobe contained a Hurthle cell nodule but permanents are needed to look for capsular invasion.      Discharge Exam: Blood pressure 117/61, pulse 72, temperature 98.3 F (36.8 C), temperature source Oral, resp. rate 18, height 5' 5.5" (1.664 m), weight 72.1 kg (159 lb), SpO2 98 %. Incision covered with Liquiban.    Disposition: Final discharge disposition not confirmed  Discharge Instructions    Diet - low sodium heart healthy    Complete by:  As directed   Discharge instructions    Complete by:  As directed   May shower Resume synthroid as prescribed but contact Dr. Nyoka Cowden about increasing that dose to compensate for thyroidectomy Delay restart of aspirin for 5 days.   Increase activity slowly    Complete by:  As directed   No wound care    Complete by:  As directed       Medication List    STOP taking these medications   aspirin 325 MG tablet     TAKE these medications   amLODipine 2.5 MG tablet Commonly known as:  NORVASC TAKE ONE TABLET BY MOUTH ONCE DAILY TO LOWER BLOOD PRESSURE   CALCIUM CITRATE + D PO Take 630 mg by mouth. With 500iu D one tablet daily in morning   CENTRUM SILVER PO Take 1 tablet by mouth daily.   Fish Oil 1200  MG Caps Take 1 capsule by mouth daily.   HYDROcodone-acetaminophen 5-325 MG tablet Commonly known as:  NORCO/VICODIN Take 1-2 tablets by mouth every 4 (four) hours as needed for moderate pain.   levothyroxine 75 MCG tablet Commonly known as:  SYNTHROID, LEVOTHROID TAKE ONE TABLET BY MOUTH ONCE DAILY 30 MINUTES BEFORE BREAKFAST FOR THYROID   losartan-hydrochlorothiazide 100-12.5 MG tablet Commonly known as:  HYZAAR TAKE ONE TABLET BY MOUTH ONCE DAILY IN THE MORNING FOR BLOOD PRESSURE   Vitamin D 2000 units tablet Take 1,000 Units by mouth daily.      Follow-up Information    Pedro Earls, MD .   Specialty:  General Surgery Contact information: [address] [place] [address] [phone]        Jeanmarie Hubert, MD .   Specialty:  Internal Medicine Contact information: [place] [address] [phone]           Signed: Pedro Earls 08/15/2016, 8:25 AM

## 2016-08-15 NOTE — Progress Notes (Signed)
Patient is being discharged home. Discharge instructions were given to patient and family 

## 2016-08-15 NOTE — Discharge Instructions (Signed)
Thyroidectomy, Care After °Refer to this sheet in the next few weeks. These instructions provide you with information about caring for yourself after your procedure. Your health care provider may also give you more specific instructions. Your treatment has been planned according to current medical practices, but problems sometimes occur. Call your health care provider if you have any problems or questions after your procedure. °WHAT TO EXPECT AFTER THE PROCEDURE °After your procedure, it is typical to have: °· Mild pain in the neck or upper body, especially when swallowing. °· A sore throat. °· A weak voice. °HOME CARE INSTRUCTIONS  °· Take medicines only as directed by your health care provider. °· If your entire thyroid gland was removed, you may need to take thyroid hormone medicine from now on. °· Do not take medicines that contain aspirin and ibuprofen until your health care provider says that you can. These medicines can increase your risk of bleeding. °· Some pain medicines cause constipation. Drink enough fluid to keep your urine clear or pale yellow. This can help to prevent constipation. °· Start slowly with eating. You may need to have only liquids and soft foods for a few days or as directed by your health care provider. °· Do not take baths, swim, or use a hot tub until your health care provider approves. °· There are many different ways to close and cover an incision, including stitches (sutures), skin glue, and adhesive strips. Follow your health care provider's instructions for: °¨ Incision care. °¨ Bandage (dressing) changes and removal. °¨ Incision closure removal. °· Resume your usual activities as directed by your health care provider. °· For the first 10 days after the procedure or as instructed by your health care provider: °¨ Do not lift anything heavier than 20 lb (9.1 kg). °¨ Do not jog, swim, or do other strenuous exercises. °¨ Do not play contact sports. °· Keep all follow-up visits as  directed by your health care provider. This is important. °SEEK MEDICAL CARE IF: °· The soreness in your throat gets worse. °· You have increased pain at your incision or incisions. °· You have increased bleeding from an incision. °· Your incision becomes infected. Watch for: °¨ Swelling. °¨ Redness. °¨ Warmth. °¨ Pus. °· You notice a bad smell coming from an incision or dressing. °· You have a fever. °· You feel lightheaded or faint. °· You have numbness, tingling, or muscle spasms in your: °¨ Arms. °¨ Hands. °¨ Feet. °¨ Face. °· You have trouble swallowing. °SEEK IMMEDIATE MEDICAL CARE IF:  °· You develop a rash. °· You have difficulty breathing. °· You hear whistling noises coming from your chest. °· You develop a cough that gets worse. °· Your speech changes, or you have hoarseness that gets worse. °  °This information is not intended to replace advice given to you by your health care provider. Make sure you discuss any questions you have with your health care provider. °  °Document Released: 06/14/2005 Document Revised: 12/16/2014 Document Reviewed: 04/27/2014 °Elsevier Interactive Patient Education ©2016 [employer] ° °

## 2016-08-20 ENCOUNTER — Encounter: Payer: Self-pay | Admitting: Internal Medicine

## 2016-08-20 ENCOUNTER — Non-Acute Institutional Stay: Payer: PPO | Admitting: Internal Medicine

## 2016-08-20 VITALS — BP 120/82 | HR 87 | Temp 97.9°F | Ht 65.0 in | Wt 160.0 lb

## 2016-08-20 DIAGNOSIS — K5900 Constipation, unspecified: Secondary | ICD-10-CM | POA: Insufficient documentation

## 2016-08-20 DIAGNOSIS — K649 Unspecified hemorrhoids: Secondary | ICD-10-CM | POA: Diagnosis not present

## 2016-08-20 DIAGNOSIS — K625 Hemorrhage of anus and rectum: Secondary | ICD-10-CM

## 2016-08-20 DIAGNOSIS — I10 Essential (primary) hypertension: Secondary | ICD-10-CM

## 2016-08-20 DIAGNOSIS — C73 Malignant neoplasm of thyroid gland: Secondary | ICD-10-CM | POA: Insufficient documentation

## 2016-08-20 DIAGNOSIS — K5901 Slow transit constipation: Secondary | ICD-10-CM

## 2016-08-20 HISTORY — DX: Hemorrhage of anus and rectum: K62.5

## 2016-08-20 HISTORY — DX: Malignant neoplasm of thyroid gland: C73

## 2016-08-20 HISTORY — DX: Unspecified hemorrhoids: K64.9

## 2016-08-20 MED ORDER — HYDROCORTISONE 2.5 % RE CREA: TOPICAL_CREAM | RECTAL | 4 refills | Status: DC

## 2016-08-20 NOTE — Progress Notes (Signed)
Facility  [facility]    Place of Service: Clinic (12)     Allergies  Allergen Reactions  . Sulfa Antibiotics   . Sulfamethoxazole Rash    Chief Complaint  Patient presents with  . Medical Management of Chronic Issues    had near total thyroidectomy 08/14/16 Dr. Johnathan Hausen, follow-up, wants to talk about thyroid medication. Here with husband.  . Constipation    having bright red blood with bowel movement. Stopped Hydrocodone 08/18/16 due to constipation. Drinking Prune juice for 2 days.    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 Johnathan Hausen. Pathology results confirmed adenocarcinoma. Dr. Hassell Done has referred her to Dr. Elyse Hsu, endocrinologist. Patient says she is doing well and really has not had any pain related to surgery.  Rectal bleeding - history of hemorrhoids. Became constipated on the hydrocodone that she was using. Now having anal discomfort and bleeding.  Essential hypertension - controlled  Slow transit constipation - secondary to use of hydrocodone. Using prunes. Says she is having a bowel movement each day. Anal discomfort has increased along with bleeding from her hemorrhoids.    Medications: Patient's Medications  New Prescriptions   No medications on file  Previous Medications   AMLODIPINE (NORVASC) 2.5 MG TABLET    TAKE ONE TABLET BY MOUTH ONCE DAILY TO LOWER BLOOD PRESSURE   ASPIRIN EC 81 MG TABLET    Take 81 mg by mouth daily.   CALCIUM CITRATE-VITAMIN D (CALCIUM CITRATE + D PO)    Take 630 mg by mouth. With 500iu D one tablet daily in morning   CHOLECALCIFEROL (VITAMIN D) 2000 UNITS TABLET    Take 1,000 Units by mouth daily.    HYDROCODONE-ACETAMINOPHEN (NORCO/VICODIN) 5-325 MG TABLET    Take 1-2 tablets by mouth every 4 (four) hours as needed for moderate pain.   LEVOTHYROXINE (SYNTHROID, LEVOTHROID) 75 MCG TABLET    TAKE ONE TABLET BY MOUTH ONCE DAILY 30 MINUTES BEFORE BREAKFAST FOR THYROID   LOSARTAN-HYDROCHLOROTHIAZIDE (HYZAAR) 100-12.5 MG TABLET    TAKE ONE TABLET BY MOUTH ONCE DAILY IN THE MORNING FOR BLOOD PRESSURE   MELATONIN 3 MG TABS    Take by mouth. Take one tablet at bedtime for sleep   MULTIPLE VITAMINS-MINERALS (CENTRUM SILVER PO)    Take 1 tablet by mouth daily.    OMEGA-3 FATTY ACIDS (FISH OIL) 1200 MG CAPS    Take 1 capsule by mouth daily.   Modified Medications   No medications on file  Discontinued Medications   No medications on file     Review of Systems  Constitutional: Negative.  Negative for activity change, appetite change, chills, diaphoresis, fatigue, fever and unexpected weight change.  HENT: Negative for congestion, ear discharge, ear pain, hearing loss, postnasal drip, rhinorrhea, sore throat, tinnitus, trouble swallowing and voice change.   Eyes: Negative for pain, redness, itching and visual disturbance.       Corrective lenses. Dry eyes.  Respiratory: Negative for cough (thin yelow sputum), choking, chest tightness, shortness of breath and wheezing.   Cardiovascular: Negative for chest pain, palpitations and leg swelling.       History of hypertension.  Gastrointestinal: Positive for anal bleeding, constipation and rectal pain. Negative for abdominal distention, abdominal pain, diarrhea and nausea.       Flatulence. History of hemorrhoids. Sensations of having to burp.Rectal bleeding since on hydrocodone.  Endocrine: Negative for cold intolerance, heat intolerance, polydipsia, polyphagia and polyuria.  History of hypothyroidism. 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  Genitourinary: Positive for frequency. Negative for difficulty urinating, dysuria, flank pain, hematuria, pelvic pain, urgency and vaginal discharge.       History of hysterectomy and bilateral salpingo-oophorectomy.  Musculoskeletal: Positive for arthralgias, back pain and myalgias. Negative for gait problem.       History of leg cramps. Burning  at the right knee that was replaced.  Pain in the left trapezius and anteriorly in the left shoulder. History of surgery to the left breast for cancer. Had nodal dissection and subsequent lymphedema of the left arm/\.  Skin: Negative for color change, pallor and rash.  Allergic/Immunologic: Negative.   Neurological: Negative for dizziness, tremors, seizures, syncope, weakness, numbness and headaches.       History migraine headaches.  Hematological: Negative for adenopathy. Does not bruise/bleed easily.  Psychiatric/Behavioral: Negative for agitation, behavioral problems, confusion, dysphoric mood, hallucinations, sleep disturbance and suicidal ideas. The patient is not nervous/anxious and is not hyperactive.     Vitals:   08/20/16 0853  BP: 120/82  Pulse: 87  Temp: 97.9 F (36.6 C)  SpO2: 99%  Weight: 160 lb (72.6 kg)  Height: _0  (1.651 m)   Wt Readings from Last 3 Encounters:  08/20/16 160 lb (72.6 kg)  08/14/16 159 lb (72.1 kg)  08/08/16 159 lb (72.1 kg)    Body mass index is 26.63 kg/m.  Physical Exam  Constitutional: She is oriented to person, place, and time. She appears well-developed and well-nourished. No distress.  HENT:  Right Ear: External ear normal.  Left Ear: External ear normal.  Nose: Nose normal.  Mouth/Throat: Oropharynx is clear and moist. No oropharyngeal exudate.  Eyes: Conjunctivae and EOM are normal. Pupils are equal, round, and reactive to light. No scleral icterus.  Neck: No JVD present. No tracheal deviation present. No thyromegaly present.  Transverse scar of the lower neck due to thyroid resection 08/14/16.  Cardiovascular: Normal rate, regular rhythm, normal heart sounds and intact distal pulses.  Exam reveals no gallop and no friction rub.   No murmur heard. Pulmonary/Chest: Effort normal. No respiratory distress. She has no wheezes. She has no rales. She exhibits no tenderness.  Partial left mastectomy. Scars from breast reduction surgery of  the right breast.  Abdominal: She exhibits no distension and no mass. There is no tenderness.  Genitourinary:  Genitourinary Comments: Absent cervix. Absent ovaries. Mild cystocele. Large external and internal hemorrhoids with bleeding. Tendeer.  Musculoskeletal: Normal range of motion. She exhibits tenderness (Both knees). She exhibits no edema.  Lymphedema left arm status post mastectomy.  Lymphadenopathy:    She has no cervical adenopathy.  Neurological: She is alert and oriented to person, place, and time. No cranial nerve deficit. Coordination normal.  05/15/2015 MMSE 26/30. Passed clock drawing. 05/14/16 MMSE 29/30. Failed clock drawing.  Skin: No rash noted. She is not diaphoretic. No erythema. No pallor.  Scars left and right breasts and right knee.  Psychiatric: She has a normal mood and affect. Her behavior is normal. Judgment and thought content normal.     Labs reviewed: Lab Summary Latest Ref Rng & Units 08/15/2016 08/14/2016 08/08/2016 05/09/2016  Hemoglobin 12.0 - 15.0 g/dL 11.7(L) 13.3 13.0 (None)  Hematocrit 36.0 - 46.0 % 34.0(L) 38.7 38.4 (None)  White count 4.0 - 10.5 K/uL 8.7 16.4(H) 5.4 (None)  Platelet count 150 - 400 K/uL 278 306 336 (None)  Sodium 135 - 145 mmol/L 133(L) (None) 134(L) 138  Potassium  3.5 - 5.1 mmol/L 4.1 (None) 4.1 4.6  Calcium 8.9 - 10.3 mg/dL 8.6(L) (None) 9.7 (None)  Phosphorus - (None) (None) (None) (None)  Creatinine 0.44 - 1.00 mg/dL 0.69 0.84 0.73 0.8  AST 15 - 41 U/L 21 (None) (None) 17  Alk Phos 38 - 126 U/L 50 (None) (None) 66  Bilirubin 0.3 - 1.2 mg/dL 0.6 (None) (None) (None)  Glucose 65 - 99 mg/dL 120(H) (None) 89 82  Cholesterol 0 - 200 mg/dL (None) (None) (None) 199  HDL cholesterol 35 - 70 mg/dL (None) (None) (None) 56  Triglycerides 40 - 160 mg/dL (None) (None) (None) 145  LDL Direct - (None) (None) (None) (None)  LDL Calc mg/dL (None) (None) (None) 114  Total protein 6.5 - 8.1 g/dL 6.2(L) (None) (None) (None)  Albumin 3.5 - 5.0  g/dL 3.4(L) (None) (None) (None)  Some recent data might be hidden   Lab Results  Component Value Date   TSH 1.87 05/09/2016   Lab Results  Component Value Date   BUN 12 08/15/2016   BUN 20 08/08/2016   BUN 18 05/09/2016   Lab Results  Component Value Date   CREATININE 0.69 08/15/2016   CREATININE 0.84 08/14/2016   CREATININE 0.73 08/08/2016   No results found for: HGBA1C     Assessment/Plan  1. Hurthle cell adenocarcinoma ([place]) -see Dr. Elyse Hsu as planned  2. Rectal bleeding Secondary hemorrhoidal irritation from recent constipation due to use of hydrocodone  3. Essential hypertension Controlled  4. Slow transit constipation Use prunes and/or MiraLAX. She is RE stopped her hydrocodone  5. Hemorrhoids, unspecified hemorrhoid type - hydrocortisone (PROCTOZONE-HC) 2.5 % rectal cream; Apply 4 times daily to hemorrhoids internally and externally  Dispense: 30 g; Refill: 4

## 2016-08-22 ENCOUNTER — Telehealth: Payer: Self-pay

## 2016-08-22 NOTE — Telephone Encounter (Signed)
Message left on triage voicemail: Patient with ongoing hemorrhoids and bleeding   Per Janett Billow is constipation resolved? If patient in any pain- if yes recommend Sitz bath.  I called patient and left message requesting return call to get additional information.

## 2016-08-23 ENCOUNTER — Telehealth: Payer: Self-pay | Admitting: *Deleted

## 2016-08-23 NOTE — Telephone Encounter (Signed)
Patient son, [name] called and stated that Dr. West Pugh office recommends that Dr. Nyoka Cowden contact Dr. Alztheimer's office to give the Dr. Billey Gosling information on patient in order for the Dr. To prescribed medication. I instructed them to sign a Medical Release Form to have her medical records sent to his office for review. Patient agreed.

## 2016-09-09 DIAGNOSIS — E890 Postprocedural hypothyroidism: Secondary | ICD-10-CM | POA: Diagnosis not present

## 2016-09-09 DIAGNOSIS — C73 Malignant neoplasm of thyroid gland: Secondary | ICD-10-CM | POA: Diagnosis not present

## 2016-09-26 ENCOUNTER — Encounter: Payer: Self-pay | Admitting: Internal Medicine

## 2016-09-26 DIAGNOSIS — E039 Hypothyroidism, unspecified: Secondary | ICD-10-CM | POA: Diagnosis not present

## 2016-09-26 DIAGNOSIS — I10 Essential (primary) hypertension: Secondary | ICD-10-CM | POA: Diagnosis not present

## 2016-09-26 LAB — BASIC METABOLIC PANEL
BUN: 17 mg/dL (ref 4–21)
CREATININE: 0.8 mg/dL (ref 0.5–1.1)
GLUCOSE: 98 mg/dL
Potassium: 4.7 mmol/L (ref 3.4–5.3)
SODIUM: 137 mmol/L (ref 137–147)

## 2016-09-26 LAB — TSH: TSH: 7.63 u[IU]/mL — AB (ref 0.41–5.90)

## 2016-09-26 LAB — CBC AND DIFFERENTIAL
HCT: 40 % (ref 36–46)
Hemoglobin: 13.3 g/dL (ref 12.0–16.0)
PLATELETS: 370 10*3/uL (ref 150–399)
WBC: 6.5 10^3/mL

## 2016-09-27 ENCOUNTER — Other Ambulatory Visit: Payer: Self-pay

## 2016-10-01 ENCOUNTER — Non-Acute Institutional Stay: Payer: PPO | Admitting: Internal Medicine

## 2016-10-01 ENCOUNTER — Encounter: Payer: Self-pay | Admitting: Internal Medicine

## 2016-10-01 VITALS — BP 144/68 | HR 73 | Temp 97.8°F | Ht 65.0 in | Wt 163.0 lb

## 2016-10-01 DIAGNOSIS — L729 Follicular cyst of the skin and subcutaneous tissue, unspecified: Secondary | ICD-10-CM | POA: Diagnosis not present

## 2016-10-01 DIAGNOSIS — I10 Essential (primary) hypertension: Secondary | ICD-10-CM

## 2016-10-01 DIAGNOSIS — K5901 Slow transit constipation: Secondary | ICD-10-CM

## 2016-10-01 DIAGNOSIS — E890 Postprocedural hypothyroidism: Secondary | ICD-10-CM

## 2016-10-01 DIAGNOSIS — C73 Malignant neoplasm of thyroid gland: Secondary | ICD-10-CM | POA: Diagnosis not present

## 2016-10-01 DIAGNOSIS — R413 Other amnesia: Secondary | ICD-10-CM | POA: Diagnosis not present

## 2016-10-01 NOTE — Progress Notes (Signed)
Facility      Place of Service: Clinic (12)     Allergies  Allergen Reactions  . Sulfa Antibiotics   . Sulfamethoxazole Rash    Chief Complaint  Patient presents with  . Medical Management of Chronic Issues    6 week medication management thyroid, blood pressure, Hurthle cell adenocarcinoma, constipation, review labs.    HPI:  Hurthle cell adenocarcinoma ([place]) - status post surgery and now followed by Dr. Elyse Hsu. Considering radiation therapy for post surgical ablation of remnants of the Hurthle cell tumor.  Postoperative hypothyroidism - Leothyroxine increased 09/13/16 to 100 mcg from 75 mcg qd.  Essential hypertension - controlled  Memory impairment - present prior to her surgery, but patient and husband believe her memory is worse since heer surgery.Difficulty remembering names.  Slow transit constipation - chronic and unchanged    Medications: Patient's Medications  New Prescriptions   No medications on file  Previous Medications   AMLODIPINE (NORVASC) 2.5 MG TABLET    TAKE ONE TABLET BY MOUTH ONCE DAILY TO LOWER BLOOD PRESSURE   ASPIRIN EC 81 MG TABLET    Take 81 mg by mouth daily.   CALCIUM CITRATE-VITAMIN D (CALCIUM CITRATE + D PO)    Take 630 mg by mouth. With 500iu D one tablet daily in morning   CHOLECALCIFEROL (VITAMIN D) 2000 UNITS TABLET    Take 1,000 Units by mouth daily.    HYDROCODONE-ACETAMINOPHEN (NORCO/VICODIN) 5-325 MG TABLET    Take 1-2 tablets by mouth every 4 (four) hours as needed for moderate pain.   HYDROCORTISONE (PROCTOZONE-HC) 2.5 % RECTAL CREAM    Apply 4 times daily to hemorrhoids internally and externally   HYDROCORTISONE CREAM 1 %    Apply 1 application topically. Apply to chest for rash once daily as needed   LOSARTAN-HYDROCHLOROTHIAZIDE (HYZAAR) 100-12.5 MG TABLET    TAKE ONE TABLET BY MOUTH ONCE DAILY IN THE MORNING FOR BLOOD PRESSURE   MELATONIN 3 MG TABS    Take by mouth. Take one tablet at bedtime for sleep   MULTIPLE  VITAMINS-MINERALS (CENTRUM SILVER PO)    Take 1 tablet by mouth daily.    OMEGA-3 FATTY ACIDS (FISH OIL) 1200 MG CAPS    Take 1 capsule by mouth daily.    SYNTHROID 100 MCG TABLET    Take one tablet daily before breakfast for thyroid  Modified Medications   No medications on file  Discontinued Medications   LEVOTHYROXINE (SYNTHROID, LEVOTHROID) 75 MCG TABLET    TAKE ONE TABLET BY MOUTH ONCE DAILY 30 MINUTES BEFORE BREAKFAST FOR THYROID     Review of Systems  Constitutional: Negative.  Negative for activity change, appetite change, chills, diaphoresis, fatigue, fever and unexpected weight change.  HENT: Negative for congestion, ear discharge, ear pain, hearing loss, postnasal drip, rhinorrhea, sore throat, tinnitus, trouble swallowing and voice change.   Eyes: Negative for pain, redness, itching and visual disturbance.       Corrective lenses. Dry eyes.  Respiratory: Negative for cough (thin yelow sputum), choking, chest tightness, shortness of breath and wheezing.   Cardiovascular: Negative for chest pain, palpitations and leg swelling.       History of hypertension.  Gastrointestinal: Positive for anal bleeding, constipation and rectal pain. Negative for abdominal distention, abdominal pain, diarrhea and nausea.       Flatulence. History of hemorrhoids. Sensations of having to burp.Rectal bleeding since on hydrocodone.  Endocrine: Negative for cold intolerance, heat intolerance, polydipsia, polyphagia and polyuria.  History of hypothyroidism. 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  Genitourinary: Positive for frequency. Negative for difficulty urinating, dysuria, flank pain, hematuria, pelvic pain, urgency and vaginal discharge.       History of hysterectomy and bilateral salpingo-oophorectomy.  Musculoskeletal: Positive for arthralgias, back pain and myalgias. Negative for gait problem.       History of leg cramps. Burning at the right knee that was  replaced.  Pain in the left trapezius and anteriorly in the left shoulder. History of surgery to the left breast for cancer. Had nodal dissection and subsequent lymphedema of the left arm/\.  Skin: Negative for color change, pallor and rash.  Allergic/Immunologic: Negative.   Neurological: Negative for dizziness, tremors, seizures, syncope, weakness, numbness and headaches.       History migraine headaches.  Hematological: Negative for adenopathy. Does not bruise/bleed easily.  Psychiatric/Behavioral: Negative for agitation, behavioral problems, confusion, dysphoric mood, hallucinations, sleep disturbance and suicidal ideas. The patient is not nervous/anxious and is not hyperactive.     Vitals:   10/01/16 0915  BP: (!) 144/68  Pulse: 73  Temp: 97.8 F (36.6 C)  TempSrc: Oral  SpO2: 95%  Weight: 163 lb (73.9 kg)  Height: 5' 5"  (1.651 m)   Wt Readings from Last 3 Encounters:  10/01/16 163 lb (73.9 kg)  08/20/16 160 lb (72.6 kg)  08/14/16 159 lb (72.1 kg)    Body mass index is 27.12 kg/m.  Physical Exam  Constitutional: She is oriented to person, place, and time. She appears well-developed and well-nourished. No distress.  HENT:  Right Ear: External ear normal.  Left Ear: External ear normal.  Nose: Nose normal.  Mouth/Throat: Oropharynx is clear and moist. No oropharyngeal exudate.  Eyes: Conjunctivae and EOM are normal. Pupils are equal, round, and reactive to light. No scleral icterus.  Neck: No JVD present. No tracheal deviation present. No thyromegaly present.  Transverse scar of the lower neck due to thyroid resection 08/14/16.  Cardiovascular: Normal rate, regular rhythm, normal heart sounds and intact distal pulses.  Exam reveals no gallop and no friction rub.   No murmur heard. Pulmonary/Chest: Effort normal. No respiratory distress. She has no wheezes. She has no rales. She exhibits no tenderness.  Partial left mastectomy. Scars from breast reduction surgery of the  right breast.  Abdominal: She exhibits no distension and no mass. There is no tenderness.  Genitourinary:  Genitourinary Comments: Absent cervix. Absent ovaries. Mild cystocele. Large external and internal hemorrhoids with bleeding. Tendeer.  Musculoskeletal: Normal range of motion. She exhibits tenderness (Both knees). She exhibits no edema.  Lymphedema left arm status post mastectomy.  Lymphadenopathy:    She has no cervical adenopathy.  Neurological: She is alert and oriented to person, place, and time. No cranial nerve deficit. Coordination normal.  05/15/2015 MMSE 26/30. Passed clock drawing. 05/14/16 MMSE 29/30. Failed clock drawing.  Skin: No rash noted. She is not diaphoretic. No erythema. No pallor.  Scars left and right breasts and right knee. Small cystic lesion of the dorsal skin of the left 4th finger. Small lesion of the skin dorsally of the left 3rd finger.  Psychiatric: She has a normal mood and affect. Her behavior is normal. Judgment and thought content normal.     Labs reviewed: Lab Summary Latest Ref Rng & Units 09/26/2016 08/15/2016 08/14/2016 08/08/2016  Hemoglobin 12.0 - 16.0 g/dL 13.3 11.7(L) 13.3 13.0  Hematocrit 36 - 46 % 40 34.0(L) 38.7 38.4  White count 10:3/mL 6.5 8.7 16.4(H)  5.4  Platelet count 150 - 399 K/L 370 278 306 336  Sodium 137 - 147 mmol/L 137 133(L) (None) 134(L)  Potassium 3.4 - 5.3 mmol/L 4.7 4.1 (None) 4.1  Calcium 8.9 - 10.3 mg/dL (None) 8.6(L) (None) 9.7  Phosphorus - (None) (None) (None) (None)  Creatinine 0.5 - 1.1 mg/dL 0.8 0.69 0.84 0.73  AST 15 - 41 U/L (None) 21 (None) (None)  Alk Phos 38 - 126 U/L (None) 50 (None) (None)  Bilirubin 0.3 - 1.2 mg/dL (None) 0.6 (None) (None)  Glucose mg/dL 98 120(H) (None) 89  Cholesterol - (None) (None) (None) (None)  HDL cholesterol - (None) (None) (None) (None)  Triglycerides - (None) (None) (None) (None)  LDL Direct - (None) (None) (None) (None)  LDL Calc - (None) (None) (None) (None)  Total protein  6.5 - 8.1 g/dL (None) 6.2(L) (None) (None)  Albumin 3.5 - 5.0 g/dL (None) 3.4(L) (None) (None)  Some recent data might be hidden   Lab Results  Component Value Date   TSH 7.63 (A) 09/26/2016   Lab Results  Component Value Date   BUN 17 09/26/2016   BUN 12 08/15/2016   BUN 20 08/08/2016   Lab Results  Component Value Date   CREATININE 0.8 09/26/2016   CREATININE 0.69 08/15/2016   CREATININE 0.84 08/14/2016   No results found for: HGBA1C     Assessment/Plan  1. Hurthle cell adenocarcinoma ([place]) -continue follow up with Dr. Elyse Hsu. I recommended that she have the nuclear remnant ablation.  2. Postoperative hypothyroidism -continue 100 mcg levethyroxine and repeat TSH in about a month  3. Essential hypertension controlled  4. Memory impairment Repeat MMSE in Dec 2017  5. Slow transit constipation Continue mild laxatives  6. Cyst of skin See dermatologist

## 2016-10-21 ENCOUNTER — Ambulatory Visit (INDEPENDENT_AMBULATORY_CARE_PROVIDER_SITE_OTHER): Payer: PPO | Admitting: Podiatry

## 2016-10-21 ENCOUNTER — Encounter: Payer: Self-pay | Admitting: Podiatry

## 2016-10-21 DIAGNOSIS — M2010 Hallux valgus (acquired), unspecified foot: Secondary | ICD-10-CM | POA: Diagnosis not present

## 2016-10-21 DIAGNOSIS — M779 Enthesopathy, unspecified: Secondary | ICD-10-CM

## 2016-10-21 NOTE — Progress Notes (Signed)
Subjective:     Patient ID: Frances Holland, female   DOB: 18-Mar-1929, 82 y.o.   MRN: [id number]  HPI patient states my orthotics are still hurting and I been trying to wear them and they started off really bothering me that they seem to get better and it feels like the arch is too high   Review of Systems     Objective:   Physical Exam Neurovascular status intact with tendinitis noted of a mild to moderate nature with orthotics which appear to be stable but may be too rigid for patient    Assessment:     Tendinitis    Plan:     Orthotics were modified at this time to create more flexibility will be seen back to recheck

## 2016-10-23 DIAGNOSIS — H5213 Myopia, bilateral: Secondary | ICD-10-CM | POA: Diagnosis not present

## 2016-10-23 DIAGNOSIS — H35033 Hypertensive retinopathy, bilateral: Secondary | ICD-10-CM | POA: Diagnosis not present

## 2016-10-24 NOTE — Telephone Encounter (Signed)
Patient seen in clinic since phone note

## 2016-11-12 ENCOUNTER — Encounter: Payer: Self-pay | Admitting: Internal Medicine

## 2016-11-18 ENCOUNTER — Other Ambulatory Visit: Payer: Self-pay | Admitting: Internal Medicine

## 2016-11-18 ENCOUNTER — Telehealth: Payer: Self-pay | Admitting: *Deleted

## 2016-11-18 DIAGNOSIS — E890 Postprocedural hypothyroidism: Secondary | ICD-10-CM

## 2016-11-18 NOTE — Telephone Encounter (Signed)
Spoke with patient, she is to have TSH done at [facility] Thursday 11/21/16.

## 2016-11-18 NOTE — Telephone Encounter (Signed)
Patient's Husband, [name] called and stated that patient had her thyroid surgery done and requesting labwork to check her Thyroid levels. Stated that patient has an appointment for 12/19 and would like to have labs done at [facility] this Thursday. Please Advise.

## 2016-11-18 NOTE — Telephone Encounter (Signed)
Order for TSH entered in her chart. Please schedule blood draw at [facility] this week.

## 2016-11-19 ENCOUNTER — Other Ambulatory Visit: Payer: Self-pay

## 2016-11-19 DIAGNOSIS — E890 Postprocedural hypothyroidism: Secondary | ICD-10-CM

## 2016-11-21 DIAGNOSIS — E890 Postprocedural hypothyroidism: Secondary | ICD-10-CM | POA: Diagnosis not present

## 2016-11-21 LAB — TSH: TSH: 2.33 mIU/L

## 2016-11-26 ENCOUNTER — Non-Acute Institutional Stay: Payer: PPO | Admitting: Internal Medicine

## 2016-11-26 ENCOUNTER — Encounter: Payer: Self-pay | Admitting: Internal Medicine

## 2016-11-26 VITALS — BP 142/82 | HR 73 | Temp 97.7°F | Ht 64.0 in | Wt 165.0 lb

## 2016-11-26 DIAGNOSIS — M25512 Pain in left shoulder: Secondary | ICD-10-CM

## 2016-11-26 DIAGNOSIS — E890 Postprocedural hypothyroidism: Secondary | ICD-10-CM | POA: Diagnosis not present

## 2016-11-26 DIAGNOSIS — I10 Essential (primary) hypertension: Secondary | ICD-10-CM | POA: Diagnosis not present

## 2016-11-26 DIAGNOSIS — Z853 Personal history of malignant neoplasm of breast: Secondary | ICD-10-CM

## 2016-11-26 DIAGNOSIS — R413 Other amnesia: Secondary | ICD-10-CM

## 2016-11-26 DIAGNOSIS — C4490 Unspecified malignant neoplasm of skin, unspecified: Secondary | ICD-10-CM

## 2016-11-26 NOTE — Progress Notes (Signed)
Facility  [facility]    Place of Service: Clinic (12)     Allergies  Allergen Reactions  . Sulfa Antibiotics   . Sulfamethoxazole Rash    Chief Complaint  Patient presents with  . Medical Management of Chronic Issues    blood pressure, thyroid    HPI:  Pain in the left shoulder has lymphedema. It seems affected by cold breezes. This is a chronic issue. Had lymph nodes removed with left mastectomy for cancer. She will try aspirin.  Insomnia. Using melatonin.  Having fatigue after a day of housework.  Mucus cyst of the dorsum of the left ring finger.  Lesion of the left side of the chin that seems solid. ? basal cell cancer. Present about 6 mo.  Medications: Patient's Medications  New Prescriptions   No medications on file  Previous Medications   AMLODIPINE (NORVASC) 2.5 MG TABLET    TAKE ONE TABLET BY MOUTH ONCE DAILY TO LOWER BLOOD PRESSURE   ASPIRIN EC 81 MG TABLET    Take 81 mg by mouth daily.   CALCIUM CITRATE-VITAMIN D (CALCIUM CITRATE + D PO)    Take 630 mg by mouth. With 500iu D one tablet daily in morning   CHOLECALCIFEROL (VITAMIN D) 2000 UNITS TABLET    Take 1,000 Units by mouth daily.    HYDROCORTISONE (PROCTOZONE-HC) 2.5 % RECTAL CREAM    Apply 4 times daily to hemorrhoids internally and externally   HYDROCORTISONE CREAM 1 %    Apply 1 application topically. Apply to chest for rash once daily as needed   LOSARTAN-HYDROCHLOROTHIAZIDE (HYZAAR) 100-12.5 MG TABLET    TAKE ONE TABLET BY MOUTH ONCE DAILY IN THE MORNING FOR BLOOD PRESSURE   MELATONIN 3 MG TABS    Take by mouth. Take one tablet at bedtime for sleep   MULTIPLE VITAMINS-MINERALS (CENTRUM SILVER PO)    Take 1 tablet by mouth daily.    OMEGA-3 FATTY ACIDS (FISH OIL) 1200 MG CAPS    Take 1 capsule by mouth daily.    SYNTHROID 100 MCG TABLET    Take one tablet daily before breakfast for thyroid  Modified Medications   No medications on file  Discontinued Medications   HYDROCODONE-ACETAMINOPHEN  (NORCO/VICODIN) 5-325 MG TABLET    Take 1-2 tablets by mouth every 4 (four) hours as needed for moderate pain.     Review of Systems  Constitutional: Negative.  Negative for activity change, appetite change, chills, diaphoresis, fatigue, fever and unexpected weight change.  HENT: Negative for congestion, ear discharge, ear pain, hearing loss, postnasal drip, rhinorrhea, sore throat, tinnitus, trouble swallowing and voice change.   Eyes: Negative for pain, redness, itching and visual disturbance.       Corrective lenses. Dry eyes.  Respiratory: Negative for cough (thin yelow sputum), choking, chest tightness, shortness of breath and wheezing.   Cardiovascular: Negative for chest pain, palpitations and leg swelling.       History of hypertension.  Gastrointestinal: Positive for anal bleeding, constipation and rectal pain. Negative for abdominal distention, abdominal pain, diarrhea and nausea.       Flatulence. History of hemorrhoids. Sensations of having to burp.Rectal bleeding since on hydrocodone.  Endocrine: Negative for cold intolerance, heat intolerance, polydipsia, polyphagia and polyuria.       History of hypothyroidism. 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  Genitourinary: Positive for frequency. Negative for difficulty urinating, dysuria, flank pain, hematuria, pelvic pain, urgency and vaginal discharge.  History of hysterectomy and bilateral salpingo-oophorectomy.  Musculoskeletal: Positive for arthralgias, back pain and myalgias. Negative for gait problem.       History of leg cramps. Burning at the right knee that was replaced.  Pain in the left trapezius and anteriorly in the left shoulder. History of surgery to the left breast for cancer. Had nodal dissection and subsequent lymphedema of the left arm/\.  Skin: Negative for color change, pallor and rash.  Allergic/Immunologic: Negative.   Neurological: Negative for dizziness, tremors, seizures,  syncope, weakness, numbness and headaches.       History migraine headaches.  Hematological: Negative for adenopathy. Does not bruise/bleed easily.  Psychiatric/Behavioral: Negative for agitation, behavioral problems, confusion, dysphoric mood, hallucinations, sleep disturbance and suicidal ideas. The patient is not nervous/anxious and is not hyperactive.     Vitals:   11/26/16 0936  BP: (!) 142/82  Pulse: 73  Temp: 97.7 F (36.5 C)  TempSrc: Oral  SpO2: 99%  Weight: 165 lb (74.8 kg)  Height: _0  (1.626 m)   Wt Readings from Last 3 Encounters:  11/26/16 165 lb (74.8 kg)  10/01/16 163 lb (73.9 kg)  08/20/16 160 lb (72.6 kg)    Body mass index is 28.32 kg/m.  Physical Exam  Constitutional: She is oriented to person, place, and time. She appears well-developed and well-nourished. No distress.  HENT:  Right Ear: External ear normal.  Left Ear: External ear normal.  Nose: Nose normal.  Mouth/Throat: Oropharynx is clear and moist. No oropharyngeal exudate.  Eyes: Conjunctivae and EOM are normal. Pupils are equal, round, and reactive to light. No scleral icterus.  Neck: No JVD present. No tracheal deviation present. No thyromegaly present.  Transverse scar of the lower neck due to thyroid resection 08/14/16.  Cardiovascular: Normal rate, regular rhythm, normal heart sounds and intact distal pulses.  Exam reveals no gallop and no friction rub.   No murmur heard. Pulmonary/Chest: Effort normal. No respiratory distress. She has no wheezes. She has no rales. She exhibits no tenderness.  Partial left mastectomy. Scars from breast reduction surgery of the right breast.  Abdominal: She exhibits no distension and no mass. There is no tenderness.  Genitourinary:  Genitourinary Comments: Absent cervix. Absent ovaries. Mild cystocele. Large external and internal hemorrhoids with bleeding. Tendeer.  Musculoskeletal: Normal range of motion. She exhibits tenderness (Both knees). She exhibits no  edema.  Lymphedema left arm status post mastectomy.  Lymphadenopathy:    She has no cervical adenopathy.  Neurological: She is alert and oriented to person, place, and time. No cranial nerve deficit. Coordination normal.  05/15/2015 MMSE 26/30. Passed clock drawing. 05/14/16 MMSE 29/30. Failed clock drawing. 11/26/16 MMSE 29/30. Passed clock drawing.  Skin: No rash noted. She is not diaphoretic. No erythema. No pallor.  Scars left and right breasts and right knee. Small cystic lesion of the dorsal skin of the left 4th finger. Small lesion of the skin dorsally of the left 3rd finger seems to have resolved. Sebaceous cysts of the chin and left side of the lower lip. Solid pearly lesion of the left side of the chin.  Psychiatric: She has a normal mood and affect. Her behavior is normal. Judgment and thought content normal.     Labs reviewed: Lab Summary Latest Ref Rng & Units 09/26/2016 08/15/2016 08/14/2016 08/08/2016  Hemoglobin 12.0 - 16.0 g/dL 13.3 11.7(L) 13.3 13.0  Hematocrit 36 - 46 % 40 34.0(L) 38.7 38.4  White count 10:3/mL 6.5 8.7 16.4(H) 5.4  Platelet count 150 -  399 K/L 370 278 306 336  Sodium 137 - 147 mmol/L 137 133(L) (None) 134(L)  Potassium 3.4 - 5.3 mmol/L 4.7 4.1 (None) 4.1  Calcium 8.9 - 10.3 mg/dL (None) 8.6(L) (None) 9.7  Phosphorus - (None) (None) (None) (None)  Creatinine 0.5 - 1.1 mg/dL 0.8 0.69 0.84 0.73  AST 15 - 41 U/L (None) 21 (None) (None)  Alk Phos 38 - 126 U/L (None) 50 (None) (None)  Bilirubin 0.3 - 1.2 mg/dL (None) 0.6 (None) (None)  Glucose mg/dL 98 120(H) (None) 89  Cholesterol - (None) (None) (None) (None)  HDL cholesterol - (None) (None) (None) (None)  Triglycerides - (None) (None) (None) (None)  LDL Direct - (None) (None) (None) (None)  LDL Calc - (None) (None) (None) (None)  Total protein 6.5 - 8.1 g/dL (None) 6.2(L) (None) (None)  Albumin 3.5 - 5.0 g/dL (None) 3.4(L) (None) (None)  Some recent data might be hidden   Lab Results  Component  Value Date   TSH 2.33 11/21/2016   Lab Results  Component Value Date   BUN 17 09/26/2016   BUN 12 08/15/2016   BUN 20 08/08/2016   Lab Results  Component Value Date   CREATININE 0.8 09/26/2016   CREATININE 0.69 08/15/2016   CREATININE 0.84 08/14/2016   No results found for: HGBA1C     Assessment/Plan  1. Essential hypertension controlled - Comprehensive metabolic panel; Future  2. Postoperative hypothyroidism compensated - TSH; Future  3. Pain in joint of left shoulder Use ASA, Aleve, or ibuprofen  4. History of breast cancer  5. Memory impairment MMSE  6. Skin cancer - Ambulatory referral to Dermatology

## 2016-12-23 ENCOUNTER — Encounter: Payer: Self-pay | Admitting: Internal Medicine

## 2016-12-23 DIAGNOSIS — L720 Epidermal cyst: Secondary | ICD-10-CM | POA: Diagnosis not present

## 2016-12-23 DIAGNOSIS — D485 Neoplasm of uncertain behavior of skin: Secondary | ICD-10-CM | POA: Diagnosis not present

## 2016-12-23 DIAGNOSIS — C44319 Basal cell carcinoma of skin of other parts of face: Secondary | ICD-10-CM | POA: Diagnosis not present

## 2016-12-23 DIAGNOSIS — D1801 Hemangioma of skin and subcutaneous tissue: Secondary | ICD-10-CM | POA: Diagnosis not present

## 2017-01-27 ENCOUNTER — Encounter: Payer: Self-pay | Admitting: Podiatry

## 2017-01-27 ENCOUNTER — Ambulatory Visit (INDEPENDENT_AMBULATORY_CARE_PROVIDER_SITE_OTHER): Payer: PPO | Admitting: Podiatry

## 2017-01-27 DIAGNOSIS — M779 Enthesopathy, unspecified: Secondary | ICD-10-CM

## 2017-01-27 NOTE — Progress Notes (Signed)
Subjective:     Patient ID: Frances Holland, female   DOB: 02-27-1929, 83 y.o.   MRN: [id number]  HPI patient presents stating that the orthotics still don't feel right   Review of Systems     Objective:   Physical Exam Neurovascular status intact with patient continuing to have malocclusion orthotics bilateral    Assessment:     Modifications and they gave her some improvement but still having trouble tolerating    Plan:     Will have patient meet with pedal at this to discuss changes and orthotics

## 2017-02-19 DIAGNOSIS — E890 Postprocedural hypothyroidism: Secondary | ICD-10-CM | POA: Diagnosis not present

## 2017-02-19 DIAGNOSIS — I10 Essential (primary) hypertension: Secondary | ICD-10-CM | POA: Diagnosis not present

## 2017-02-20 ENCOUNTER — Other Ambulatory Visit: Payer: PPO

## 2017-02-20 LAB — COMPREHENSIVE METABOLIC PANEL
ALBUMIN: 3.9 g/dL (ref 3.6–5.1)
ALK PHOS: 61 U/L (ref 33–130)
ALT: 14 U/L (ref 6–29)
AST: 16 U/L (ref 10–35)
BILIRUBIN TOTAL: 0.4 mg/dL (ref 0.2–1.2)
BUN: 17 mg/dL (ref 7–25)
CALCIUM: 9.7 mg/dL (ref 8.6–10.4)
CO2: 25 mmol/L (ref 20–31)
Chloride: 101 mmol/L (ref 98–110)
Creat: 0.74 mg/dL (ref 0.60–0.88)
Glucose, Bld: 95 mg/dL (ref 65–99)
POTASSIUM: 4.3 mmol/L (ref 3.5–5.3)
Sodium: 134 mmol/L — ABNORMAL LOW (ref 135–146)
Total Protein: 6.5 g/dL (ref 6.1–8.1)

## 2017-02-20 LAB — TSH: TSH: 1.02 mIU/L

## 2017-02-25 ENCOUNTER — Non-Acute Institutional Stay: Payer: PPO | Admitting: Internal Medicine

## 2017-02-25 ENCOUNTER — Encounter: Payer: Self-pay | Admitting: Internal Medicine

## 2017-02-25 VITALS — BP 148/96 | HR 78 | Temp 97.6°F | Ht 64.0 in | Wt 163.0 lb

## 2017-02-25 DIAGNOSIS — R413 Other amnesia: Secondary | ICD-10-CM | POA: Diagnosis not present

## 2017-02-25 DIAGNOSIS — E785 Hyperlipidemia, unspecified: Secondary | ICD-10-CM

## 2017-02-25 DIAGNOSIS — C73 Malignant neoplasm of thyroid gland: Secondary | ICD-10-CM

## 2017-02-25 DIAGNOSIS — E890 Postprocedural hypothyroidism: Secondary | ICD-10-CM

## 2017-02-25 DIAGNOSIS — I10 Essential (primary) hypertension: Secondary | ICD-10-CM

## 2017-02-25 NOTE — Progress Notes (Signed)
Facility  [facility]    Place of Service: Clinic (12)     Allergies  Allergen Reactions  . Sulfa Antibiotics   . Sulfamethoxazole Rash    Chief Complaint  Patient presents with  . Medical Management of Chronic Issues    3 month medication management blood pressure, thyroid, review labs.    HPI:   Hurthle cell adenocarcinoma ([place]) - she is still to be scheduled for iodine treatment. Was having trouble getting in touch with Dr. Elyse Hsu. Has also requested appt with Dr. Chauncey Cruel. Forde Dandy.  Postoperative hypothyroidism - compensated  Essential hypertension - elevated.  Patient believes that her memory is OK except for recall of names. Her husband is concerned about it. She scored 29/30 on MMSE in Dec 2017. They both have concerns about the future if he precedes her in death, but he has written down how he keeps accounts and has scheduled an appt with an accountant to do their taxes. They are a 2nd marriage and have his and her families although the "children" get along. They have been married 30 years.  Medications: Patient's Medications  New Prescriptions   No medications on file  Previous Medications   AMLODIPINE (NORVASC) 2.5 MG TABLET    TAKE ONE TABLET BY MOUTH ONCE DAILY TO LOWER BLOOD PRESSURE   ASPIRIN 325 MG TABLET    Take 325 mg by mouth. Take one tablet daily   CALCIUM CITRATE-VITAMIN D (CALCIUM CITRATE + D PO)    Take 630 mg by mouth. With 500iu D one tablet daily in morning   CHOLECALCIFEROL (VITAMIN D) 2000 UNITS TABLET    Take 1,000 Units by mouth daily.    HYDROCORTISONE CREAM 1 %    Apply 1 application topically. Apply to chest for rash once daily as needed   LOSARTAN-HYDROCHLOROTHIAZIDE (HYZAAR) 100-12.5 MG TABLET    TAKE ONE TABLET BY MOUTH ONCE DAILY IN THE MORNING FOR BLOOD PRESSURE   MELATONIN 3 MG TABS    Take by mouth. Take one tablet at bedtime for sleep   MULTIPLE VITAMINS-MINERALS (CENTRUM SILVER PO)    Take 1 tablet by mouth daily.    OMEGA-3 FATTY ACIDS (FISH  OIL) 1200 MG CAPS    Take 1 capsule by mouth daily.    POLYETHYL GLYCOL-PROPYL GLYCOL (SYSTANE) 0.4-0.3 % SOLN    Apply to eye. Use two drops of Systane once a day for dry eyes   SIMETHICONE (GAS RELIEF 125 MAX ST PO)    Take by mouth. Take 1-2 tablets as needed after meals   SYNTHROID 100 MCG TABLET    Take one tablet daily before breakfast for thyroid  Modified Medications   No medications on file  Discontinued Medications   ASPIRIN EC 81 MG TABLET    Take 81 mg by mouth daily.   HYDROCORTISONE (PROCTOZONE-HC) 2.5 % RECTAL CREAM    Apply 4 times daily to hemorrhoids internally and externally     Review of Systems  Constitutional: Negative.  Negative for activity change, appetite change, chills, diaphoresis, fatigue, fever and unexpected weight change.  HENT: Negative for congestion, ear discharge, ear pain, hearing loss, postnasal drip, rhinorrhea, sore throat, tinnitus, trouble swallowing and voice change.   Eyes: Negative for pain, redness, itching and visual disturbance.       Corrective lenses. Dry eyes.  Respiratory: Negative for cough (thin yelow sputum), choking, chest tightness, shortness of breath and wheezing.   Cardiovascular: Negative for chest pain, palpitations and leg swelling.  History of hypertension.  Gastrointestinal: Positive for anal bleeding, constipation and rectal pain. Negative for abdominal distention, abdominal pain, diarrhea and nausea.       Flatulence. History of hemorrhoids. Sensations of having to burp.Rectal bleeding since on hydrocodone.  Endocrine: Negative for cold intolerance, heat intolerance, polydipsia, polyphagia and polyuria.       History of hypothyroidism. 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  Genitourinary: Positive for frequency. Negative for difficulty urinating, dysuria, flank pain, hematuria, pelvic pain, urgency and vaginal discharge.       History of hysterectomy and bilateral salpingo-oophorectomy.    Musculoskeletal: Positive for arthralgias, back pain and myalgias. Negative for gait problem.       History of leg cramps. Burning at the right knee that was replaced.  Pain in the left trapezius and anteriorly in the left shoulder. History of surgery to the left breast for cancer. Had nodal dissection and subsequent lymphedema of the left arm/\.  Skin: Negative for color change, pallor and rash.  Allergic/Immunologic: Negative.   Neurological: Negative for dizziness, tremors, seizures, syncope, weakness, numbness and headaches.       History migraine headaches.  Hematological: Negative for adenopathy. Does not bruise/bleed easily.  Psychiatric/Behavioral: Negative for agitation, behavioral problems, confusion, dysphoric mood, hallucinations, sleep disturbance and suicidal ideas. The patient is not nervous/anxious and is not hyperactive.     Vitals:   02/25/17 1129  BP: (!) 148/96  Pulse: 78  Temp: 97.6 F (36.4 C)  TempSrc: Oral  SpO2: 97%  Weight: 163 lb (73.9 kg)  Height: _0  (1.626 m)   Wt Readings from Last 3 Encounters:  02/25/17 163 lb (73.9 kg)  11/26/16 165 lb (74.8 kg)  10/01/16 163 lb (73.9 kg)    Body mass index is 27.98 kg/m.  Physical Exam  Constitutional: She is oriented to person, place, and time. She appears well-developed and well-nourished. No distress.  HENT:  Right Ear: External ear normal.  Left Ear: External ear normal.  Nose: Nose normal.  Mouth/Throat: Oropharynx is clear and moist. No oropharyngeal exudate.  Eyes: Conjunctivae and EOM are normal. Pupils are equal, round, and reactive to light. No scleral icterus.  Neck: No JVD present. No tracheal deviation present. No thyromegaly present.  Transverse scar of the lower neck due to thyroid resection 08/14/16.  Cardiovascular: Normal rate, regular rhythm, normal heart sounds and intact distal pulses.  Exam reveals no gallop and no friction rub.   No murmur heard. Pulmonary/Chest: Effort normal. No  respiratory distress. She has no wheezes. She has no rales. She exhibits no tenderness.  Partial left mastectomy. Scars from breast reduction surgery of the right breast.  Abdominal: She exhibits no distension and no mass. There is no tenderness.  Genitourinary:  Genitourinary Comments: Absent cervix. Absent ovaries. Mild cystocele. Large external and internal hemorrhoids with bleeding. Tendeer.  Musculoskeletal: Normal range of motion. She exhibits tenderness (Both knees). She exhibits no edema.  Lymphedema left arm status post mastectomy.  Lymphadenopathy:    She has no cervical adenopathy.  Neurological: She is alert and oriented to person, place, and time. No cranial nerve deficit. Coordination normal.  05/15/2015 MMSE 26/30. Passed clock drawing. 05/14/16 MMSE 29/30. Failed clock drawing. 11/26/16 MMSE 29/30. Passed clock drawing.  Skin: No rash noted. She is not diaphoretic. No erythema. No pallor.  Scars left and right breasts and right knee. Small cystic lesion of the dorsal skin of the left 4th finger. Small lesion of the skin dorsally of  the left 3rd finger seems to have resolved. Sebaceous cysts of the chin and left side of the lower lip. Solid pearly lesion of the left side of the chin.  Psychiatric: She has a normal mood and affect. Her behavior is normal. Judgment and thought content normal.     Labs reviewed: Lab Summary Latest Ref Rng & Units 02/19/2017 09/26/2016 08/15/2016 08/14/2016  Hemoglobin 12.0 - 16.0 g/dL (None) 13.3 11.7(L) 13.3  Hematocrit 36 - 46 % (None) 40 34.0(L) 38.7  White count 10:3/mL (None) 6.5 8.7 16.4(H)  Platelet count 150 - 399 K/L (None) 370 278 306  Sodium 135 - 146 mmol/L 134(L) 137 133(L) (None)  Potassium 3.5 - 5.3 mmol/L 4.3 4.7 4.1 (None)  Calcium 8.6 - 10.4 mg/dL 9.7 (None) 8.6(L) (None)  Phosphorus - (None) (None) (None) (None)  Creatinine 0.60 - 0.88 mg/dL 0.74 0.8 0.69 0.84  AST 10 - 35 U/L 16 (None) 21 (None)  Alk Phos 33 - 130 U/L 61  (None) 50 (None)  Bilirubin 0.2 - 1.2 mg/dL 0.4 (None) 0.6 (None)  Glucose 65 - 99 mg/dL 95 98 120(H) (None)  Cholesterol - (None) (None) (None) (None)  HDL cholesterol - (None) (None) (None) (None)  Triglycerides - (None) (None) (None) (None)  LDL Direct - (None) (None) (None) (None)  LDL Calc - (None) (None) (None) (None)  Total protein 6.1 - 8.1 g/dL 6.5 (None) 6.2(L) (None)  Albumin 3.6 - 5.1 g/dL 3.9 (None) 3.4(L) (None)  Some recent data might be hidden   Lab Results  Component Value Date   TSH 1.02 02/19/2017   Lab Results  Component Value Date   BUN 17 02/19/2017   BUN 17 09/26/2016   BUN 12 08/15/2016   Lab Results  Component Value Date   CREATININE 0.74 02/19/2017   CREATININE 0.8 09/26/2016   CREATININE 0.69 08/15/2016   No results found for: HGBA1C     Assessment/Plan  1. Hurthle cell adenocarcinoma [facility]) She is waiting for appt with Dr. Forde Dandy to discuss whether she needs radiation to the thyroid. - TSH; Future  2. Postoperative hypothyroidism compensated - TSH; Future  3. Essential hypertension controlled - Basic metabolic panel; Future  4. Memory impairment MMSE next visit  5. Hyperlipidemia, unspecified hyperlipidemia type - Lipid panel; Future

## 2017-02-26 ENCOUNTER — Other Ambulatory Visit: Payer: PPO

## 2017-02-27 DIAGNOSIS — C73 Malignant neoplasm of thyroid gland: Secondary | ICD-10-CM | POA: Diagnosis not present

## 2017-03-04 ENCOUNTER — Other Ambulatory Visit (HOSPITAL_COMMUNITY): Payer: Self-pay | Admitting: Surgery

## 2017-03-04 DIAGNOSIS — C73 Malignant neoplasm of thyroid gland: Secondary | ICD-10-CM

## 2017-03-05 ENCOUNTER — Other Ambulatory Visit: Payer: PPO

## 2017-03-06 ENCOUNTER — Other Ambulatory Visit: Payer: PPO

## 2017-03-13 DIAGNOSIS — Z6827 Body mass index (BMI) 27.0-27.9, adult: Secondary | ICD-10-CM | POA: Diagnosis not present

## 2017-03-13 DIAGNOSIS — I10 Essential (primary) hypertension: Secondary | ICD-10-CM | POA: Diagnosis not present

## 2017-03-13 DIAGNOSIS — C50919 Malignant neoplasm of unspecified site of unspecified female breast: Secondary | ICD-10-CM | POA: Diagnosis not present

## 2017-03-13 DIAGNOSIS — E890 Postprocedural hypothyroidism: Secondary | ICD-10-CM | POA: Diagnosis not present

## 2017-03-13 DIAGNOSIS — C73 Malignant neoplasm of thyroid gland: Secondary | ICD-10-CM | POA: Diagnosis not present

## 2017-03-13 DIAGNOSIS — M810 Age-related osteoporosis without current pathological fracture: Secondary | ICD-10-CM | POA: Diagnosis not present

## 2017-03-17 ENCOUNTER — Other Ambulatory Visit (HOSPITAL_COMMUNITY): Payer: Self-pay | Admitting: Endocrinology

## 2017-03-17 DIAGNOSIS — C73 Malignant neoplasm of thyroid gland: Secondary | ICD-10-CM

## 2017-03-19 ENCOUNTER — Other Ambulatory Visit: Payer: PPO

## 2017-03-20 ENCOUNTER — Other Ambulatory Visit: Payer: PPO

## 2017-03-25 ENCOUNTER — Non-Acute Institutional Stay: Payer: PPO | Admitting: Internal Medicine

## 2017-03-25 ENCOUNTER — Encounter: Payer: Self-pay | Admitting: Internal Medicine

## 2017-03-25 VITALS — BP 120/82 | HR 74 | Temp 97.6°F | Ht 64.0 in | Wt 163.0 lb

## 2017-03-25 DIAGNOSIS — C73 Malignant neoplasm of thyroid gland: Secondary | ICD-10-CM | POA: Diagnosis not present

## 2017-03-25 DIAGNOSIS — I10 Essential (primary) hypertension: Secondary | ICD-10-CM

## 2017-03-25 DIAGNOSIS — M25512 Pain in left shoulder: Secondary | ICD-10-CM | POA: Diagnosis not present

## 2017-03-25 DIAGNOSIS — E890 Postprocedural hypothyroidism: Secondary | ICD-10-CM | POA: Diagnosis not present

## 2017-03-25 MED ORDER — LEVOTHYROXINE SODIUM 112 MCG PO TABS: ORAL_TABLET | ORAL | 3 refills | Status: DC

## 2017-03-25 MED ORDER — LIDOCAINE 4 % EX CREA: TOPICAL_CREAM | CUTANEOUS | 0 refills | Status: DC

## 2017-03-25 MED ORDER — NAPROXEN SODIUM 220 MG PO TABS: ORAL_TABLET | ORAL | 5 refills | Status: DC

## 2017-03-25 NOTE — Progress Notes (Signed)
Facility  [facility]    Place of Service: Clinic (12)     Allergies  Allergen Reactions  . Sulfa Antibiotics   . Sulfamethoxazole Rash    Chief Complaint  Patient presents with  . Shoulder Pain    left shoulder pain off and on for 2 years, since her mastectomy.  Past 2 weeks has sharper pain. Using heating pad, aspirin. It hurts when she moves it, ok when she doesn't move it.     HPI:  Pain in joint of left shoulder - chronic discomfort in the left shoulder that comes and goes. Pain most recently has been worse. No known injury or strain. Had xray of the left shoulder one year ago. See report below. Degenerative arthritis noted. She is fully mobile in the shoulder, but has pain to palpation in the trapezoid and left deltoid. And coracoid area.   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 Forde Dandy who ran some additional tests and told her she did not need radiation therapy.   Essential hypertension - controlled  Postoperative hypothyroidism - Dr. Forde Dandy increased levothyroxine to 125mg    Medications: Patient's Medications  New Prescriptions   No medications on file  Previous Medications   AMLODIPINE (NORVASC) 2.5 MG TABLET    TAKE ONE TABLET BY MOUTH ONCE DAILY TO LOWER BLOOD PRESSURE   ASPIRIN 325 MG TABLET    Take 325 mg by mouth. Take one tablet daily   CALCIUM CITRATE-VITAMIN D (CALCIUM CITRATE + D PO)    Take 630 mg by mouth. With 500iu D one tablet daily in morning   CHOLECALCIFEROL (VITAMIN D) 2000 UNITS TABLET    Take 1,000 Units by mouth daily.    HYDROCORTISONE CREAM 1 %    Apply 1 application topically. Apply to chest for rash once daily as needed   LOSARTAN-HYDROCHLOROTHIAZIDE (HYZAAR) 100-12.5 MG TABLET    TAKE ONE TABLET BY MOUTH ONCE DAILY IN THE MORNING FOR BLOOD PRESSURE   MELATONIN 3 MG TABS    Take by mouth. Take one tablet at bedtime for sleep   MULTIPLE VITAMINS-MINERALS (CENTRUM SILVER PO)    Take 1 tablet by mouth daily.    OMEGA-3 FATTY ACIDS (FISH OIL)  1200 MG CAPS    Take 1 capsule by mouth daily.    POLYETHYL GLYCOL-PROPYL GLYCOL (SYSTANE) 0.4-0.3 % SOLN    Apply to eye. Use two drops of Systane once a day for dry eyes   SIMETHICONE (GAS RELIEF 125 MAX ST PO)    Take by mouth. Take 1-2 tablets as needed after meals   SYNTHROID 100 MCG TABLET    Take one tablet daily before breakfast for thyroid  Modified Medications   No medications on file  Discontinued Medications   No medications on file     Review of Systems  Constitutional: Negative.  Negative for activity change, appetite change, chills, diaphoresis, fatigue, fever and unexpected weight change.  HENT: Negative for congestion, ear discharge, ear pain, hearing loss, postnasal drip, rhinorrhea, sore throat, tinnitus, trouble swallowing and voice change.   Eyes: Negative for pain, redness, itching and visual disturbance.       Corrective lenses. Dry eyes.  Respiratory: Negative for cough (thin yelow sputum), choking, chest tightness, shortness of breath and wheezing.   Cardiovascular: Negative for chest pain, palpitations and leg swelling.       History of hypertension.  Gastrointestinal: Positive for anal bleeding, constipation and rectal pain. Negative for abdominal distention, abdominal pain, diarrhea and nausea.  Flatulence. History of hemorrhoids. Sensations of having to burp.Rectal bleeding since on hydrocodone.  Endocrine: Negative for cold intolerance, heat intolerance, polydipsia, polyphagia and polyuria.       History of hypothyroidism. 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  Genitourinary: Positive for frequency. Negative for difficulty urinating, dysuria, flank pain, hematuria, pelvic pain, urgency and vaginal discharge.       History of hysterectomy and bilateral salpingo-oophorectomy.  Musculoskeletal: Positive for arthralgias, back pain and myalgias. Negative for gait problem.       History of leg cramps. Burning at the right knee  that was replaced.  Pain in the left trapezius and anteriorly in the left shoulder. History of surgery to the left breast for cancer. Had nodal dissection and subsequent lymphedema of the left arm/\.  Skin: Negative for color change, pallor and rash.  Allergic/Immunologic: Negative.   Neurological: Negative for dizziness, tremors, seizures, syncope, weakness, numbness and headaches.       History migraine headaches.  Hematological: Negative for adenopathy. Does not bruise/bleed easily.  Psychiatric/Behavioral: Negative for agitation, behavioral problems, confusion, dysphoric mood, hallucinations, sleep disturbance and suicidal ideas. The patient is not nervous/anxious and is not hyperactive.     Vitals:   03/25/17 0914  BP: 120/82  Pulse: 74  Temp: 97.6 F (36.4 C)  TempSrc: Oral  SpO2: 99%  Weight: 163 lb (73.9 kg)  Height: _0  (1.626 m)   Wt Readings from Last 3 Encounters:  03/25/17 163 lb (73.9 kg)  02/25/17 163 lb (73.9 kg)  11/26/16 165 lb (74.8 kg)    Body mass index is 27.98 kg/m.  Physical Exam  Constitutional: She is oriented to person, place, and time. She appears well-developed and well-nourished. No distress.  HENT:  Right Ear: External ear normal.  Left Ear: External ear normal.  Nose: Nose normal.  Mouth/Throat: Oropharynx is clear and moist. No oropharyngeal exudate.  Eyes: Conjunctivae and EOM are normal. Pupils are equal, round, and reactive to light. No scleral icterus.  Neck: No JVD present. No tracheal deviation present. No thyromegaly present.  Transverse scar of the lower neck due to thyroid resection 08/14/16.  Cardiovascular: Normal rate, regular rhythm, normal heart sounds and intact distal pulses.  Exam reveals no gallop and no friction rub.   No murmur heard. Pulmonary/Chest: Effort normal. No respiratory distress. She has no wheezes. She has no rales. She exhibits no tenderness.  Partial left mastectomy. Scars from breast reduction surgery of  the right breast.  Abdominal: She exhibits no distension and no mass. There is no tenderness.  Genitourinary:  Genitourinary Comments: Absent cervix. Absent ovaries. Mild cystocele. Large external and internal hemorrhoids with bleeding. Tendeer.  Musculoskeletal: Normal range of motion. She exhibits tenderness (Both knees. left trapezius and left deltoid.). She exhibits no edema.  Lymphedema left arm status post mastectomy.  Lymphadenopathy:    She has no cervical adenopathy.  Neurological: She is alert and oriented to person, place, and time. No cranial nerve deficit. Coordination normal.  05/15/2015 MMSE 26/30. Passed clock drawing. 05/14/16 MMSE 29/30. Failed clock drawing. 11/26/16 MMSE 29/30. Passed clock drawing.  Skin: No rash noted. She is not diaphoretic. No erythema. No pallor.  Scars left and right breasts and right knee. Small cystic lesion of the dorsal skin of the left 4th finger. Small lesion of the skin dorsally of the left 3rd finger seems to have resolved. Sebaceous cysts of the chin and left side of the lower lip. Solid pearly lesion of the  left side of the chin.  Psychiatric: She has a normal mood and affect. Her behavior is normal. Judgment and thought content normal.     Labs reviewed: Lab Summary Latest Ref Rng & Units 02/19/2017 09/26/2016 08/15/2016 08/14/2016  Hemoglobin 12.0 - 16.0 g/dL (None) 13.3 11.7(L) 13.3  Hematocrit 36 - 46 % (None) 40 34.0(L) 38.7  White count 10:3/mL (None) 6.5 8.7 16.4(H)  Platelet count 150 - 399 K/L (None) 370 278 306  Sodium 135 - 146 mmol/L 134(L) 137 133(L) (None)  Potassium 3.5 - 5.3 mmol/L 4.3 4.7 4.1 (None)  Calcium 8.6 - 10.4 mg/dL 9.7 (None) 8.6(L) (None)  Phosphorus - (None) (None) (None) (None)  Creatinine 0.60 - 0.88 mg/dL 0.74 0.8 0.69 0.84  AST 10 - 35 U/L 16 (None) 21 (None)  Alk Phos 33 - 130 U/L 61 (None) 50 (None)  Bilirubin 0.2 - 1.2 mg/dL 0.4 (None) 0.6 (None)  Glucose 65 - 99 mg/dL 95 98 120(H) (None)    Cholesterol - (None) (None) (None) (None)  HDL cholesterol - (None) (None) (None) (None)  Triglycerides - (None) (None) (None) (None)  LDL Direct - (None) (None) (None) (None)  LDL Calc - (None) (None) (None) (None)  Total protein 6.1 - 8.1 g/dL 6.5 (None) 6.2(L) (None)  Albumin 3.6 - 5.1 g/dL 3.9 (None) 3.4(L) (None)  Some recent data might be hidden   Lab Results  Component Value Date   TSH 1.02 02/19/2017   Lab Results  Component Value Date   BUN 17 02/19/2017   BUN 17 09/26/2016   BUN 12 08/15/2016   Lab Results  Component Value Date   CREATININE 0.74 02/19/2017   CREATININE 0.8 09/26/2016   CREATININE 0.69 08/15/2016   No results found for: HGBA1C  04/23/16 CLINICAL DATA:  Left shoulder pain, lumpectomy 2 years ago  EXAM: LEFT SHOULDER - 2+ VIEW  COMPARISON:  None.  FINDINGS: There is moderate degenerative joint disease of the left shoulder with loss of glenohumeral joint space. The subacromial joint space is unremarkable. The [facility] joint appears normally aligned. Surgical clips overlie the left axilla.  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 No acute abnormality.   Assessment/Plan  1. Pain in joint of left shoulder If aleve and Aspercreme do not help, she should be seen by orthopedist. - naproxen sodium (ALEVE) 220 MG tablet; One up to twice daily to help arthritis  Dispense: 60 tablet; Refill: 5 - lidocaine (ASPERCREME W/LIDOCAINE) 4 % cream; Apply 4 times daily to painful area  Dispense: 30 g; Refill: 0  2. Hurthle cell adenocarcinoma ([place]) Seeing Dr.South  3. Essential hypertension controlled  4. Postoperative hypothyroidism - levothyroxine (SYNTHROID) 112 MCG tablet; One daily for thyroid supplement  Dispense: 90 tablet; Refill: 3

## 2017-03-28 ENCOUNTER — Telehealth: Payer: Self-pay | Admitting: Podiatry

## 2017-03-28 NOTE — Telephone Encounter (Signed)
Pt Husband came in to pick up orthotics. Did not want to be seen.

## 2017-03-31 ENCOUNTER — Other Ambulatory Visit (HOSPITAL_COMMUNITY): Payer: PPO

## 2017-03-31 ENCOUNTER — Other Ambulatory Visit: Payer: Self-pay | Admitting: Internal Medicine

## 2017-04-01 ENCOUNTER — Other Ambulatory Visit (HOSPITAL_COMMUNITY): Payer: PPO

## 2017-04-02 ENCOUNTER — Other Ambulatory Visit (HOSPITAL_COMMUNITY): Payer: PPO

## 2017-04-04 ENCOUNTER — Other Ambulatory Visit (HOSPITAL_COMMUNITY): Payer: PPO

## 2017-04-18 ENCOUNTER — Encounter: Payer: Self-pay | Admitting: Podiatry

## 2017-04-18 ENCOUNTER — Ambulatory Visit (INDEPENDENT_AMBULATORY_CARE_PROVIDER_SITE_OTHER): Payer: PPO | Admitting: Podiatry

## 2017-04-18 DIAGNOSIS — M2010 Hallux valgus (acquired), unspecified foot: Secondary | ICD-10-CM | POA: Diagnosis not present

## 2017-04-18 DIAGNOSIS — M719 Bursopathy, unspecified: Secondary | ICD-10-CM

## 2017-04-18 NOTE — Progress Notes (Signed)
This patient presents the office with chief complaint of a painful second toe left foot. She says that she tried to break in a new pair of shoes and her second toe left foot became painful to the touch. She states it was so painful that  She had difficulty sleeping.  She called the office this morning for an appointment since she is scheduled to be on vacation next week.  She presents for an evaluation and treatment of her left foot   Neurovascular status is intact from the previous visit. 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 She has been pain painful second toe left foot that is relatively elevated.  Redness and swelling noted at the level of the PIPJ second toe left foot medially.  Pain is elicited on palpation of the second toe   Bursitis Second toe left foot.  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 Padding was dispensed.  Patient was told to soak toe in epsom salts at home.   Gardiner Barefoot DPM

## 2017-05-26 ENCOUNTER — Other Ambulatory Visit (HOSPITAL_COMMUNITY): Payer: PPO

## 2017-05-26 ENCOUNTER — Encounter (HOSPITAL_COMMUNITY): Payer: Self-pay

## 2017-05-27 ENCOUNTER — Other Ambulatory Visit (HOSPITAL_COMMUNITY): Payer: PPO

## 2017-05-28 ENCOUNTER — Other Ambulatory Visit (HOSPITAL_COMMUNITY): Payer: PPO

## 2017-05-30 ENCOUNTER — Other Ambulatory Visit (HOSPITAL_COMMUNITY): Payer: PPO

## 2017-06-09 ENCOUNTER — Other Ambulatory Visit: Payer: Self-pay | Admitting: Internal Medicine

## 2017-06-09 DIAGNOSIS — Z1231 Encounter for screening mammogram for malignant neoplasm of breast: Secondary | ICD-10-CM

## 2017-06-18 ENCOUNTER — Other Ambulatory Visit: Payer: Self-pay | Admitting: Internal Medicine

## 2017-06-18 NOTE — Telephone Encounter (Signed)
Left message on voicemail for patient to return call when available   

## 2017-06-18 NOTE — Telephone Encounter (Signed)
Spoke with patient's husband, scheduled appointment for July 02, 2017 with Dr.Pandey

## 2017-06-20 DIAGNOSIS — C73 Malignant neoplasm of thyroid gland: Secondary | ICD-10-CM | POA: Diagnosis not present

## 2017-06-20 DIAGNOSIS — M545 Low back pain: Secondary | ICD-10-CM | POA: Diagnosis not present

## 2017-06-20 DIAGNOSIS — Z6827 Body mass index (BMI) 27.0-27.9, adult: Secondary | ICD-10-CM | POA: Diagnosis not present

## 2017-06-20 DIAGNOSIS — E784 Other hyperlipidemia: Secondary | ICD-10-CM | POA: Diagnosis not present

## 2017-06-20 DIAGNOSIS — I10 Essential (primary) hypertension: Secondary | ICD-10-CM | POA: Diagnosis not present

## 2017-06-20 DIAGNOSIS — E890 Postprocedural hypothyroidism: Secondary | ICD-10-CM | POA: Diagnosis not present

## 2017-06-20 DIAGNOSIS — C50919 Malignant neoplasm of unspecified site of unspecified female breast: Secondary | ICD-10-CM | POA: Diagnosis not present

## 2017-06-20 DIAGNOSIS — M810 Age-related osteoporosis without current pathological fracture: Secondary | ICD-10-CM | POA: Diagnosis not present

## 2017-06-20 DIAGNOSIS — Z1389 Encounter for screening for other disorder: Secondary | ICD-10-CM | POA: Diagnosis not present

## 2017-06-23 DIAGNOSIS — D1801 Hemangioma of skin and subcutaneous tissue: Secondary | ICD-10-CM | POA: Diagnosis not present

## 2017-06-23 DIAGNOSIS — L720 Epidermal cyst: Secondary | ICD-10-CM | POA: Diagnosis not present

## 2017-06-23 DIAGNOSIS — L821 Other seborrheic keratosis: Secondary | ICD-10-CM | POA: Diagnosis not present

## 2017-06-23 DIAGNOSIS — Z85828 Personal history of other malignant neoplasm of skin: Secondary | ICD-10-CM | POA: Diagnosis not present

## 2017-06-23 DIAGNOSIS — C4441 Basal cell carcinoma of skin of scalp and neck: Secondary | ICD-10-CM | POA: Diagnosis not present

## 2017-06-23 DIAGNOSIS — D485 Neoplasm of uncertain behavior of skin: Secondary | ICD-10-CM | POA: Diagnosis not present

## 2017-06-23 DIAGNOSIS — D1721 Benign lipomatous neoplasm of skin and subcutaneous tissue of right arm: Secondary | ICD-10-CM | POA: Diagnosis not present

## 2017-07-02 ENCOUNTER — Encounter: Payer: PPO | Admitting: Internal Medicine

## 2017-07-10 ENCOUNTER — Ambulatory Visit
Admission: RE | Admit: 2017-07-10 | Discharge: 2017-07-10 | Disposition: A | Discharge status: Home / Self Care | Payer: PPO | Source: Ambulatory Visit | Attending: Internal Medicine | Admitting: Internal Medicine

## 2017-07-10 DIAGNOSIS — Z1231 Encounter for screening mammogram for malignant neoplasm of breast: Secondary | ICD-10-CM

## 2017-07-11 ENCOUNTER — Ambulatory Visit (INDEPENDENT_AMBULATORY_CARE_PROVIDER_SITE_OTHER): Payer: PPO

## 2017-07-11 ENCOUNTER — Encounter: Payer: Self-pay | Admitting: Podiatry

## 2017-07-11 ENCOUNTER — Ambulatory Visit (INDEPENDENT_AMBULATORY_CARE_PROVIDER_SITE_OTHER): Payer: PPO | Admitting: Podiatry

## 2017-07-11 DIAGNOSIS — M2042 Other hammer toe(s) (acquired), left foot: Secondary | ICD-10-CM | POA: Diagnosis not present

## 2017-07-11 DIAGNOSIS — M2010 Hallux valgus (acquired), unspecified foot: Secondary | ICD-10-CM | POA: Diagnosis not present

## 2017-07-11 DIAGNOSIS — M2041 Other hammer toe(s) (acquired), right foot: Secondary | ICD-10-CM | POA: Diagnosis not present

## 2017-07-11 DIAGNOSIS — L84 Corns and callosities: Secondary | ICD-10-CM

## 2017-07-11 DIAGNOSIS — M779 Enthesopathy, unspecified: Secondary | ICD-10-CM | POA: Diagnosis not present

## 2017-07-11 MED ORDER — TRIAMCINOLONE ACETONIDE 10 MG/ML IJ SUSP
10.0000 mg | Freq: Once | INTRAMUSCULAR | Status: AC
  Administered 2017-07-11: 10 mg

## 2017-07-11 NOTE — Progress Notes (Signed)
Subjective:    Patient ID: Gevena Mart, female   DOB: 83 y.o.   MRN: [id number]   HPI patient presents with a lot of pain in the second digit left over right with significant structural malalignment of the lesser digits and a structural deformity the first metatarsal    ROS      Objective:  Physical Exam neurovascular status intact with inflammatory changes around the second digit left over right with fluid buildup and pain when palpated. Patient has digital deformity and deviation of the hallux bilateral and mild pain right but not to the same degree     Assessment:    Inflammatory keratotic lesion with fluid buildup of the second digit left that's painful when palpated with mild discomfort on the right localized in nature     Plan:    H&P condition reviewed and discussed. At this time her to try to use conservative treatment and not have to do surgical intervention and I did do a proximal nerve block of the left second toe and I then did a careful injection of the interphalangeal joint 1 mg Dexon some 1 mg Kenalog did deep debridement of lesion and padded. Ultimately this may require arthroplasty or Akin osteotomy but decision to be made based on response to conservative care  X-rays indicate that there is moderate deviation of the hallux against the second toe left over right with compression between the 2 adjacent digits

## 2017-07-16 ENCOUNTER — Encounter: Payer: Self-pay | Admitting: Internal Medicine

## 2017-07-16 ENCOUNTER — Non-Acute Institutional Stay: Payer: PPO | Admitting: Internal Medicine

## 2017-07-16 VITALS — BP 136/66 | HR 68 | Temp 97.6°F | Resp 18 | Ht 64.0 in | Wt 161.0 lb

## 2017-07-16 DIAGNOSIS — E890 Postprocedural hypothyroidism: Secondary | ICD-10-CM | POA: Diagnosis not present

## 2017-07-16 DIAGNOSIS — M545 Low back pain, unspecified: Secondary | ICD-10-CM

## 2017-07-16 DIAGNOSIS — I10 Essential (primary) hypertension: Secondary | ICD-10-CM | POA: Diagnosis not present

## 2017-07-16 DIAGNOSIS — C73 Malignant neoplasm of thyroid gland: Secondary | ICD-10-CM | POA: Diagnosis not present

## 2017-07-16 DIAGNOSIS — E785 Hyperlipidemia, unspecified: Secondary | ICD-10-CM | POA: Diagnosis not present

## 2017-07-16 DIAGNOSIS — N3281 Overactive bladder: Secondary | ICD-10-CM | POA: Diagnosis not present

## 2017-07-16 DIAGNOSIS — L84 Corns and callosities: Secondary | ICD-10-CM | POA: Diagnosis not present

## 2017-07-16 NOTE — Progress Notes (Signed)
[facility]  Provider: Blanchie Serve MD   Location:  [facility] Service:  Clinic (12)  PCP: Blanchie Serve, MD Patient Care Team: Blanchie Serve, MD as PCP - General (Internal Medicine)  Extended Emergency Contact Information Primary Emergency Contact: Boltz,Gordon Address: [address]          [place], [place] [address] [name] of [place] Phone: [phone] Mobile Phone: [phone] Relation: Spouse Secondary Emergency Contact: [name],[name] Address: [address]          [place], [place] [address] [name] of [place] Phone: [phone] Work Phone: [phone] Mobile Phone: [phone] Relation: Son  Goals of Care: Advanced Directive information Advanced Directives 03/25/2017  Does Patient Have a Medical Advance Directive? Yes  Type of Paramedic of [place];Living will  Does patient want to make changes to medical advance directive? -  Copy of [place] in Chart? Yes      Chief Complaint  Patient presents with  . Medical Management of Chronic Issues    4 month follow up. Patient saw the dermatologist and had a skin tag removed from the left side of her neck.  . Medication Refill    No refillls needed at this time  . Results    Discuss mammogram results    HPI: Patient is a 83 y.o. female seen today for routine visit.   Left 2nd toe corn- scraped by podiatry last Thursday Dr Mariea Clonts. Denies any discomfort. Has questions about after care.   Mammogram 07/10/17 reviewed and is normal  Saw dermatology 06/23/17 for removal of skin tag from left side of her neck with Dr Ronnald Ramp.  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 Currently on levothyroxine  HTN- stable,on amlodipine and hyzaar with aspirin  Insomnia- on melatonin that helps at times.   OA-  with back pain, knee pain and shoulder pain. Currently not on any medication    Past Medical History:  Diagnosis Date  . Back pain   . Cancer [facility])    left breaset mastectomy   . Candidiasis of skin and nails   . Cellulitis and abscess of upper arm and forearm   . Heartburn   . Hemorrhoids 08/20/2016  . History of breast cancer 2000   History left mastectomy  . Hurthle cell adenocarcinoma ([place]) 08/20/2016  . Hypertension   . Insomnia   . Knee pain, bilateral 2014  . Lymphedema    Left arm following mastectomy  . Memory impairment   . Memory loss   . Migraine 05/16/2012  . Migraine without aura, without mention of intractable migraine without mention of status migrainosus    patient denies at preop of 08/08/16   . Other and unspecified hyperlipidemia   . Other bursitis disorders   . Other infective bursitis, left shoulder   . Pain in joint, ankle and foot   . Pain in joint, hand   . Pain in joint, lower leg   . Pain in joint, shoulder region   . Pain in joint, shoulder region 04/23/2016  . Pain in limb   . Rectal bleeding 08/20/2016  . Senile osteoporosis   . Symptomatic menopausal or female climacteric states   . Syncope and collapse   . Unspecified hypertensive heart disease without heart failure 1995  . Unspecified hypothyroidism   . Unspecified polyarthropathy  or polyarthritis, site unspecified   . Unspecified urinary incontinence   . Unspecified visual loss   . Xerophthalmia    Past Surgical History:  Procedure Laterality Date  . BILATERAL OOPHORECTOMY  1981  . BREAST RECONSTRUCTION Left 01/2000  . BREAST REDUCTION SURGERY Right 01/2000  . CATARACT EXTRACTION Right 09/04/2005  . COLONOSCOPY  01/2004  . MASTECTOMY Left 10/1999  . THYROID LOBECTOMY Left 08/14/2016   Procedure: NEAR TOTAL THYROIDECTOMY;  Surgeon: Johnathan Hausen, MD;  Location: [facility];  Service: General;  Laterality: Left;  . TOTAL ABDOMINAL HYSTERECTOMY W/ BILATERAL SALPINGOOPHORECTOMY  1981  . TOTAL KNEE  ARTHROPLASTY Right 2014    reports that she has never smoked. She has never used smokeless tobacco. She reports that she does not drink alcohol or use drugs. Social History   Social History  . Marital status: Married    Spouse name: N/A  . Number of children: N/A  . Years of education: N/A   Occupational History  . retired Estate agent     Social History Main Topics  . Smoking status: Never Smoker  . Smokeless tobacco: Never Used  . Alcohol use No  . Drug use: No  . Sexual activity: Not on file   Other Topics Concern  . Not on file   Social History Narrative   L[facility] at [facility] since 07/07/2014   Married -[name]   Never smoked    Alcohol none   Caffeine 2 cups of coffee daily   Exercise walks daily, exercise class half hour 2-3 times a week   Living will,  POA   Patient believes that her memory is OK except for recall of names. Her husband is concerned about it. She scored 29/30 on MMSE in Dec 2017. They both have concerns about the future if he precedes her in death, but he has written down how he keeps accounts and has scheduled an appt with an accountant to do their taxes. They are a 2nd marriage and have his and her families although the "children" get along. They have been married 30 years.       Functional Status Survey:    Family History  Problem Relation Age of Onset  . Heart disease Mother   . Asthma Son     Health Maintenance  Topic Date Due  . INFLUENZA VACCINE  09/08/2017 (Originally 07/09/2017)  . PNA vac Low Risk Adult (2 of 2 - PCV13) 09/08/2017 (Originally 12/10/2003)  . TETANUS/TDAP  02/22/2024  . DEXA SCAN  Completed    Allergies  Allergen Reactions  . Sulfa Antibiotics   . Sulfamethoxazole Rash    Outpatient Encounter Prescriptions as of 07/16/2017  Medication Sig  . amLODipine (NORVASC) 2.5 MG tablet TAKE ONE TABLET BY MOUTH ONCE DAILY TO LOWER BLOOD PRESSURE  . aspirin EC 81 MG tablet Take 81 mg by mouth daily.  . Calcium  Citrate-Vitamin D (CALCIUM CITRATE + D PO) Take 630 mg by mouth. With 500iu D one tablet daily in morning  . Cholecalciferol 5000 units capsule Take 5,000 Units by mouth daily.  Marland Kitchen levothyroxine (SYNTHROID) 112 MCG tablet One daily for thyroid supplement  . losartan-hydrochlorothiazide (HYZAAR) 100-12.5 MG tablet TAKE ONE TABLET BY MOUTH IN THE MORNING FOR BLOOD PRESSURE  . Melatonin 3 MG TABS Take 6 mg by mouth at bedtime.   . Multiple Vitamins-Minerals (CENTRUM SILVER PO) Take 1 tablet by mouth daily.   . Omega-3 Fatty Acids (FISH OIL) 1200 MG CAPS Take 1 capsule by  mouth daily.   Vladimir Faster Glycol-Propyl Glycol (SYSTANE) 0.4-0.3 % SOLN Apply to eye. Use two drops of Systane as needed for dry eyes  . Simethicone (GAS RELIEF 125 MAX ST PO) Take by mouth. Take 1-2 tablets as needed after meals  . hydrocortisone cream 1 % Apply 1 application topically. Apply to chest for rash once daily as needed  . lidocaine (ASPERCREME W/LIDOCAINE) 4 % cream Apply 4 times daily to painful area (Patient not taking: Reported on 07/16/2017)  . naproxen sodium (ALEVE) 220 MG tablet One up to twice daily to help arthritis (Patient not taking: Reported on 07/16/2017)  . [DISCONTINUED] Cholecalciferol (VITAMIN D) 2000 UNITS tablet Take 1,000 Units by mouth daily.    No facility-administered encounter medications on file as of 07/16/2017.     Review of Systems  Constitutional: Negative for appetite change, chills, diaphoresis, fatigue and fever.  HENT: Negative for congestion, ear discharge, ear pain, hearing loss, mouth sores, rhinorrhea, sore throat and trouble swallowing.   Eyes: Negative for pain and discharge.  Respiratory: Negative for cough, shortness of breath and wheezing.   Cardiovascular: Negative for chest pain, palpitations and leg swelling.  Gastrointestinal: Negative for abdominal pain, blood in stool, diarrhea, nausea and vomiting.  Genitourinary: Positive for frequency and urgency. Negative for dysuria  and flank pain.  Musculoskeletal: Positive for arthralgias and back pain. Negative for gait problem.  Skin: Negative for pallor and wound.  Neurological: Negative for light-headedness, numbness and headaches.  Hematological: Bruises/bleeds easily.  Psychiatric/Behavioral: Negative for behavioral problems, confusion, hallucinations and sleep disturbance.    Vitals:   07/16/17 1121  BP: 136/66  Pulse: 68  Resp: 18  Temp: 97.6 F (36.4 C)  TempSrc: Oral  SpO2: 98%  Weight: 161 lb (73 kg)  Height: 5' 4"  (1.626 m)   Body mass index is 27.64 kg/m. Physical Exam  Constitutional: She is oriented to person, place, and time. She appears well-developed and well-nourished. No distress.  HENT:  Head: Normocephalic and atraumatic.  Mouth/Throat: Oropharynx is clear and moist. No oropharyngeal exudate.  Eyes: Pupils are equal, round, and reactive to light. Conjunctivae and EOM are normal. Right eye exhibits no discharge. Left eye exhibits no discharge.  Neck: Normal range of motion. Neck supple. No JVD present.  Cardiovascular: Normal rate and regular rhythm.   Pulmonary/Chest: Effort normal and breath sounds normal. No respiratory distress. She has no wheezes. She has no rales.  Abdominal: Soft. Bowel sounds are normal. There is no tenderness. There is no rebound and no guarding.  Musculoskeletal: Normal range of motion. She exhibits no edema.  Right 2nd toe medial area has open skin area from scraping of a corn/callus, no bleed or drainage noted  Lymphadenopathy:    She has no cervical adenopathy.  Neurological: She is alert and oriented to person, place, and time.  Skin: Skin is warm and dry. No rash noted. She is not diaphoretic.  Scar from thyroid resection on 9/17  Psychiatric: She has a normal mood and affect. Her behavior is normal.    Labs reviewed: Basic Metabolic Panel:  Recent Labs  08/08/16 1100  08/15/16 0442 09/26/16 02/19/17 0001  NA 134*  --  133* 137 134*  K 4.1   --  4.1 4.7 4.3  CL 99*  --  101  --  101  CO2 28  --  27  --  25  GLUCOSE 89  --  120*  --  95  BUN 20  --  12 17  17  CREATININE 0.73  < > 0.69 0.8 0.74  CALCIUM 9.7  --  8.6*  --  9.7  < > = values in this interval not displayed. Liver Function Tests:  Recent Labs  08/15/16 0442 02/19/17 0001  AST 21 16  ALT 16 14  ALKPHOS 50 61  BILITOT 0.6 0.4  PROT 6.2* 6.5  ALBUMIN 3.4* 3.9   No results for input(s): LIPASE, AMYLASE in the last 8760 hours. No results for input(s): AMMONIA in the last 8760 hours. CBC:  Recent Labs  08/08/16 1100 08/14/16 1617 08/15/16 0442 09/26/16  WBC 5.4 16.4* 8.7 6.5  HGB 13.0 13.3 11.7* 13.3  HCT 38.4 38.7 34.0* 40  MCV 86.9 87.8 88.3  --   PLT 336 306 278 370   Cardiac Enzymes: No results for input(s): CKTOTAL, CKMB, CKMBINDEX, TROPONINI in the last 8760 hours. BNP: Invalid input(s): POCBNP No results found for: HGBA1C Lab Results  Component Value Date   TSH 1.02 02/19/2017   No results found for: VITAMINB12 No results found for: FOLATE No results found for: IRON, TIBC, FERRITIN  Lipid Panel: No results for input(s): CHOL, HDL, LDLCALC, TRIG, CHOLHDL, LDLDIRECT in the last 8760 hours. No results found for: HGBA1C  Procedures since last visit: Dg Foot 2 Views Left  Result Date: 07/11/2017 Please see detailed radiograph report in office note.  Dg Foot 2 Views Right  Result Date: 07/11/2017 Please see detailed radiograph report in office note.  Mm Screening Breast Tomo Uni R  Result Date: 07/11/2017 CLINICAL DATA:  Screening. History of left breast mastectomy in 2000. EXAM: 2D DIGITAL SCREENING UNILATERAL RIGHT MAMMOGRAM WITH CAD AND ADJUNCT TOMO COMPARISON:  Previous exam(s). ACR Breast Density Category b: There are scattered areas of fibroglandular density. FINDINGS: The patient has had a left mastectomy. There are no findings suspicious for malignancy. Images were processed with CAD. IMPRESSION: No mammographic evidence of  malignancy. A result letter of this screening mammogram will be mailed directly to the patient. RECOMMENDATION: Screening mammogram in one year.  (Code:SM-R-23M) BI-RADS CATEGORY  1: Negative. Electronically Signed   By: Fidela Salisbury M.D.   On: 07/11/2017 14:54    Assessment/Plan  1. Essential hypertension Controlled BP, continue hyzaar and amlodipine with baby aspirin - CMP with eGFR; Future - Lipid Panel; Future - TSH; Future - CBC with Differential/Platelets; Future  2. Postoperative hypothyroidism Continue levothyroxine - TSH; Future  3. Hurthle cell adenocarcinoma ([place]) S/p thyroidectomy, on levothyroxine, f/u with endocrinology  4. Hyperlipidemia, unspecified hyperlipidemia type Check lipid panel. Continue fish oil - Lipid Panel; Future  5. Corn of toe S/p removal by debridement, advised to wear shoes with open front end or wide front. Padding between toes  6. OAB (overactive bladder) Discussed about being on myrbetriq, common side effect explained. reading material provided, provide script if pt agrees  7. Midline low back pain without sciatica, unspecified chronicity Worsens when in bed lying in 1 position. Improves with activity, likely DDD.occasional burning sensation to legs. No claudication. Advised to take tylenol as needed for pain. Also advised to change her mattress- 83 years old.   At end of appointment husband of patient mentions about memory concerns. Make appt in 2 weeks for MMSE and further evaluation  Labs/tests ordered:  As above  Next appointment: 2 weeks for memory evaluation  Communication: reviewed care plan with patient and husband   I spent 40 minutes in total face-to-face time with the patient, more than 50% of which was spent in  counseling and coordination of care, reviewing test results, reviewing medication and discussing or reviewing the diagnosis with patient.       Blanchie Serve, MD Internal Medicine [facility] [address] [place], [place] [address] Cell Phone (Monday-Friday 8 am - 5 pm): [phone] On Call: [phone] and follow prompts after 5 pm and on weekends Office Phone: [phone] Office Fax: [phone]

## 2017-07-17 ENCOUNTER — Telehealth: Payer: Self-pay | Admitting: *Deleted

## 2017-07-17 ENCOUNTER — Other Ambulatory Visit: Payer: Self-pay | Admitting: Internal Medicine

## 2017-07-17 ENCOUNTER — Telehealth: Payer: Self-pay

## 2017-07-17 MED ORDER — ZOSTER VAC RECOMB ADJUVANTED 50 MCG/0.5ML IM SUSR: 0.5000 mL | Freq: Once | INTRAMUSCULAR | 1 refills | Status: AC

## 2017-07-17 NOTE — Telephone Encounter (Signed)
Spoke with Mr. Frances Holland about his wifes shringrix vaccine that was sent to the pharmacy.

## 2017-07-17 NOTE — Telephone Encounter (Signed)
Ok to provide

## 2017-07-17 NOTE — Telephone Encounter (Signed)
Patient husband, [name] called and stated that his wife saw you yesterday and he was with her and they forgot to ask about your opinion on them getting the Shingrix injection. If so, you can send the Rx to their pharmacy to get it. Please Advise.

## 2017-07-23 DIAGNOSIS — E785 Hyperlipidemia, unspecified: Secondary | ICD-10-CM | POA: Diagnosis not present

## 2017-07-23 DIAGNOSIS — I10 Essential (primary) hypertension: Secondary | ICD-10-CM | POA: Diagnosis not present

## 2017-07-23 DIAGNOSIS — E890 Postprocedural hypothyroidism: Secondary | ICD-10-CM | POA: Diagnosis not present

## 2017-07-24 LAB — TSH: TSH: 0.2 m[IU]/L — AB

## 2017-07-24 LAB — LIPID PANEL
CHOL/HDL RATIO: 4 ratio (ref <5.0)
Cholesterol: 190 mg/dL (ref <200)
HDL: 48 mg/dL — ABNORMAL LOW (ref >50)
LDL CALC: 110 mg/dL — AB (ref <100)
Triglycerides: 159 mg/dL — ABNORMAL HIGH (ref <150)
VLDL: 32 mg/dL — AB (ref <30)

## 2017-07-24 LAB — COMPLETE METABOLIC PANEL WITH GFR
ALBUMIN: 4 g/dL (ref 3.6–5.1)
ALK PHOS: 65 U/L (ref 33–130)
ALT: 13 U/L (ref 6–29)
AST: 15 U/L (ref 10–35)
BUN: 18 mg/dL (ref 7–25)
CHLORIDE: 101 mmol/L (ref 98–110)
CO2: 23 mmol/L (ref 20–32)
Calcium: 9.7 mg/dL (ref 8.6–10.4)
Creat: 0.72 mg/dL (ref 0.60–0.88)
GFR, Est African American: 88 mL/min (ref >=60)
GFR, Est Non African American: 76 mL/min (ref >=60)
GLUCOSE: 90 mg/dL (ref 65–99)
POTASSIUM: 4.6 mmol/L (ref 3.5–5.3)
SODIUM: 135 mmol/L (ref 135–146)
Total Bilirubin: 0.4 mg/dL (ref 0.2–1.2)
Total Protein: 6.7 g/dL (ref 6.1–8.1)

## 2017-07-24 LAB — CBC WITH DIFFERENTIAL/PLATELET
Basophils Absolute: 61 cells/uL (ref 0–200)
Basophils Relative: 1 %
EOS PCT: 4 %
Eosinophils Absolute: 244 cells/uL (ref 15–500)
HEMATOCRIT: 40 % (ref 35.0–45.0)
HEMOGLOBIN: 13.6 g/dL (ref 11.7–15.5)
LYMPHS PCT: 34 %
Lymphs Abs: 2074 cells/uL (ref 850–3900)
MCH: 30.2 pg (ref 27.0–33.0)
MCHC: 34 g/dL (ref 32.0–36.0)
MCV: 88.9 fL (ref 80.0–100.0)
MONOS PCT: 11 %
MPV: 9.9 fL (ref 7.5–12.5)
Monocytes Absolute: 671 cells/uL (ref 200–950)
NEUTROS PCT: 50 %
Neutro Abs: 3050 cells/uL (ref 1500–7800)
Platelets: 327 10*3/uL (ref 140–400)
RBC: 4.5 MIL/uL (ref 3.80–5.10)
RDW: 13.6 % (ref 11.0–15.0)
WBC: 6.1 10*3/uL (ref 3.8–10.8)

## 2017-07-25 ENCOUNTER — Telehealth: Payer: Self-pay

## 2017-07-25 ENCOUNTER — Other Ambulatory Visit: Payer: Self-pay

## 2017-07-25 MED ORDER — LEVOTHYROXINE SODIUM 100 MCG PO TABS: 100.0000 ug | ORAL_TABLET | Freq: Every day | ORAL | 3 refills | Status: DC

## 2017-07-25 NOTE — Telephone Encounter (Signed)
Unable to reach the patient. Will try to call her after lunch

## 2017-07-31 ENCOUNTER — Telehealth: Payer: Self-pay

## 2017-07-31 NOTE — Telephone Encounter (Signed)
Mr. Weyers called asked for labs to be faxed to Dr. Roque Cash, fax [phone]. Done.

## 2017-08-13 ENCOUNTER — Non-Acute Institutional Stay: Payer: PPO | Admitting: Internal Medicine

## 2017-08-13 ENCOUNTER — Encounter: Payer: Self-pay | Admitting: Internal Medicine

## 2017-08-13 VITALS — BP 122/78 | HR 68 | Temp 98.0°F | Resp 18 | Ht 64.0 in | Wt 163.0 lb

## 2017-08-13 DIAGNOSIS — R413 Other amnesia: Secondary | ICD-10-CM

## 2017-08-13 DIAGNOSIS — E890 Postprocedural hypothyroidism: Secondary | ICD-10-CM

## 2017-08-13 DIAGNOSIS — E782 Mixed hyperlipidemia: Secondary | ICD-10-CM

## 2017-08-13 DIAGNOSIS — N814 Uterovaginal prolapse, unspecified: Secondary | ICD-10-CM | POA: Diagnosis not present

## 2017-08-13 DIAGNOSIS — F4321 Adjustment disorder with depressed mood: Secondary | ICD-10-CM | POA: Diagnosis not present

## 2017-08-13 DIAGNOSIS — G8929 Other chronic pain: Secondary | ICD-10-CM | POA: Insufficient documentation

## 2017-08-13 DIAGNOSIS — Z634 Disappearance and death of family member: Secondary | ICD-10-CM | POA: Diagnosis not present

## 2017-08-13 DIAGNOSIS — M545 Low back pain, unspecified: Secondary | ICD-10-CM

## 2017-08-13 MED ORDER — ACETAMINOPHEN 500 MG PO TABS: 500.0000 mg | ORAL_TABLET | Freq: Every day | ORAL | 0 refills | Status: DC

## 2017-08-13 NOTE — Progress Notes (Signed)
[facility]  Provider: Blanchie Serve MD   Location:  [place] of Service:  Clinic (12)  PCP: Blanchie Serve, MD Patient Care Team: Blanchie Serve, MD as PCP - General (Internal Medicine) Reynold Bowen, MD as Consulting Physician (Endocrinology)  Extended Emergency Contact Information Primary Emergency Contact: Packard,Gordon Address: [address]          [place], [place] [address] [name] of [place] Phone: [phone] Mobile Phone: [phone] Relation: Spouse Secondary Emergency Contact: [name],[name] Address: [address]          [place], [place] [address] [name] of [place] Phone: [phone] Work Phone: [phone] Mobile Phone: [phone] Relation: Son  Goals of Care: Advanced Directive information  Advanced Directives 07/25/2017  Does Patient Have a Medical Advance Directive? Yes  Type of Advance Directive Out of facility DNR (pink MOST or yellow form)  Does patient want to make changes to medical advance directive? No - Patient declined  Copy of [place] in Chart? -      Chief Complaint  Patient presents with  . Acute Visit    2 week follow up. Has some concerns about the strength about the Levothyroxine. Per patients husband Dr. Forde Dandy wants her to take the 112 mcg tablet of Levothyroxine.  Patient stated that she thinks her bladder has dropped due to age.  Lower back pain when she remains in bed but when shes active she has no back pain.   . Medication Refill    No refills needed at this time.     HPI: Patient is a 83 y.o. female seen today for acute visit.   Memory loss- patient has trouble remembering names. There has been a lot of moving around over past few years. She recently lost her step daughter a week back. She meets a lot of new people here and at church and does not recall their names.  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 Follows with Dr Forde Dandy. Recent TSH 0.20 and had recommended lowering her levothyroxine to 100 mcg with supressed TSH. Pt and husband consulted with her endocrinologist and he recommends to continue with 112 mcg daily for now. Taking 112 mcg daily at present.  Lab Results  Component Value Date   TSH 0.20 (L) 07/23/2017    prolapse- pt feels something hanging down in her vaginal area while cleaning herself post toileting. She denies anything bulging out of her vagina. Denies pain or itching. Denies discharge. Denies any hernia in past.   Low back pain- hurts in bed, improves with activity. Denies pain radiating to her legs. Pain is mainly at night. Not on any medication for pain at present. No known history of back surgery.     Past Medical History:  Diagnosis Date  . Back pain   . Cancer [facility])    left breaset mastectomy   . Candidiasis of skin and nails   . Cellulitis and abscess of upper arm and forearm   . Heartburn   . Hemorrhoids 08/20/2016  . History of breast cancer 2000   History left mastectomy  . Hurthle cell adenocarcinoma ([place]) 08/20/2016  . Hypertension   . Insomnia   . Knee pain, bilateral 2014  . Lymphedema    Left arm following mastectomy  . Memory impairment   . Memory loss   . Migraine 05/16/2012  . Migraine without aura, without mention of  intractable migraine without mention of status migrainosus    patient denies at preop of 08/08/16   . Other and unspecified hyperlipidemia   . Other bursitis disorders   . Other infective bursitis, left shoulder   . Pain in joint, ankle and foot   . Pain in joint, hand   . Pain in joint, lower leg   . Pain in joint, shoulder region   . Pain in joint, shoulder region 04/23/2016  . Pain in limb   . Rectal bleeding 08/20/2016  . Senile osteoporosis   . Symptomatic menopausal or female climacteric states   . Syncope and collapse   . Unspecified hypertensive heart disease without heart failure 1995  .  Unspecified hypothyroidism   . Unspecified polyarthropathy or polyarthritis, site unspecified   . Unspecified urinary incontinence   . Unspecified visual loss   . Xerophthalmia    Past Surgical History:  Procedure Laterality Date  . BILATERAL OOPHORECTOMY  1981  . BREAST RECONSTRUCTION Left 01/2000  . BREAST REDUCTION SURGERY Right 01/2000  . CATARACT EXTRACTION Right 09/04/2005  . COLONOSCOPY  01/2004  . MASTECTOMY Left 10/1999  . THYROID LOBECTOMY Left 08/14/2016   Procedure: NEAR TOTAL THYROIDECTOMY;  Surgeon: Johnathan Hausen, MD;  Location: [facility];  Service: General;  Laterality: Left;  . TOTAL ABDOMINAL HYSTERECTOMY W/ BILATERAL SALPINGOOPHORECTOMY  1981  . TOTAL KNEE ARTHROPLASTY Right 2014    reports that she has never smoked. She has never used smokeless tobacco. She reports that she does not drink alcohol or use drugs. Social History   Social History  . Marital status: Married    Spouse name: N/A  . Number of children: N/A  . Years of education: N/A   Occupational History  . retired Estate agent     Social History Main Topics  . Smoking status: Never Smoker  . Smokeless tobacco: Never Used  . Alcohol use No  . Drug use: No  . Sexual activity: Not on file   Other Topics Concern  . Not on file   Social History Narrative   Lives [facility] since 07/07/2014   Married -[name]   Never smoked    Alcohol none   Caffeine 2 cups of coffee daily   Exercise walks daily, exercise class half hour 2-3 times a week   Living will,  POA   Patient believes that her memory is OK except for recall of names. Her husband is concerned about it. She scored 29/30 on MMSE in Dec 2017. They both have concerns about the future if he precedes her in death, but he has written down how he keeps accounts and has scheduled an appt with an accountant to do their taxes. They are a 2nd marriage and have his and her families although the "children" get along. They have been married 30  years.       Functional Status Survey:    Family History  Problem Relation Age of Onset  . Heart disease Mother   . Asthma Son     Health Maintenance  Topic Date Due  . INFLUENZA VACCINE  09/08/2017 (Originally 07/09/2017)  . PNA vac Low Risk Adult (2 of 2 - PCV13) 09/08/2017 (Originally 12/10/2003)  . TETANUS/TDAP  02/22/2024  . DEXA SCAN  Completed    Allergies  Allergen Reactions  . Sulfa Antibiotics   . Sulfamethoxazole Rash    Outpatient Encounter Prescriptions as of 08/13/2017  Medication Sig  . amLODipine (NORVASC) 2.5 MG tablet TAKE ONE TABLET  BY MOUTH ONCE DAILY TO LOWER BLOOD PRESSURE  . aspirin EC 81 MG tablet Take 81 mg by mouth daily.  . Calcium Carb-Cholecalciferol (CALCIUM 600-D PO) Take 1 tablet by mouth daily.  . Cholecalciferol 5000 units capsule Take 5,000 Units by mouth daily.  Marland Kitchen levothyroxine (SYNTHROID, LEVOTHROID) 112 MCG tablet Take 112 mcg by mouth daily before breakfast.  . losartan-hydrochlorothiazide (HYZAAR) 100-12.5 MG tablet TAKE ONE TABLET BY MOUTH IN THE MORNING FOR BLOOD PRESSURE  . Melatonin 3 MG TABS Take 6 mg by mouth at bedtime.   . Multiple Vitamins-Minerals (CENTRUM SILVER PO) Take 1 tablet by mouth daily.   . Omega-3 Fatty Acids (OMEGA-3 CF PO) Take 530 mg by mouth daily.  Vladimir Faster Glycol-Propyl Glycol (SYSTANE) 0.4-0.3 % SOLN Apply to eye. Use two drops of Systane as needed for dry eyes  . Simethicone (GAS RELIEF 125 MAX ST PO) Take by mouth. Take 1-2 tablets as needed after meals  . [DISCONTINUED] Calcium Citrate-Vitamin D (CALCIUM CITRATE + D PO) Take 630 mg by mouth. With 500iu D one tablet daily in morning  . [DISCONTINUED] levothyroxine (SYNTHROID, LEVOTHROID) 100 MCG tablet Take 1 tablet (100 mcg total) by mouth daily.  . [DISCONTINUED] Omega-3 Fatty Acids (FISH OIL) 1200 MG CAPS Take 1 capsule by mouth daily.    No facility-administered encounter medications on file as of 08/13/2017.     Review of Systems  Constitutional:  Negative for appetite change, chills, fatigue and fever.  HENT: Negative for congestion and mouth sores.   Eyes: Negative for pain and discharge.  Respiratory: Negative for shortness of breath.   Cardiovascular: Negative for chest pain and leg swelling.  Gastrointestinal: Negative for abdominal pain, blood in stool, constipation and rectal pain.  Genitourinary: Positive for frequency and urgency. Negative for difficulty urinating, dysuria, flank pain, hematuria, pelvic pain, vaginal bleeding, vaginal discharge and vaginal pain.  Musculoskeletal: Positive for arthralgias and back pain. Negative for gait problem.  Neurological: Negative for weakness, light-headedness, numbness and headaches.  Hematological: Bruises/bleeds easily.  Psychiatric/Behavioral: Negative for agitation, behavioral problems, confusion, dysphoric mood, hallucinations and sleep disturbance.    Vitals:   08/13/17 1113  BP: 122/78  Pulse: 68  Resp: 18  Temp: 98 F (36.7 C)  TempSrc: Oral  SpO2: 97%  Weight: 163 lb (73.9 kg)  Height: 5' 4"  (1.626 m)   Body mass index is 27.98 kg/m. Physical Exam  Constitutional: She is oriented to person, place, and time. She appears well-developed and well-nourished. No distress.  HENT:  Head: Normocephalic and atraumatic.  Eyes: Pupils are equal, round, and reactive to light. Conjunctivae and EOM are normal.  Neck: Normal range of motion. Neck supple.  Cardiovascular: Normal rate and regular rhythm.   Pulmonary/Chest: Effort normal and breath sounds normal.  Abdominal: Soft. Bowel sounds are normal. She exhibits no distension. There is no tenderness. There is no rebound and no guarding.  Musculoskeletal: Normal range of motion. She exhibits no edema.  Lymphadenopathy:    She has no cervical adenopathy.  Neurological: She is alert and oriented to person, place, and time.  Skin: Skin is warm and dry. No rash noted. She is not diaphoretic.  Scar from thyroid resection on 9/17    Psychiatric: She has a normal mood and affect. Her behavior is normal.    Labs reviewed: Basic Metabolic Panel:  Recent Labs  08/15/16 0442 09/26/16 02/19/17 0001 07/23/17 0814  NA 133* 137 134* 135  K 4.1 4.7 4.3 4.6  CL 101  --  101 101  CO2 27  --  25 23  GLUCOSE 120*  --  95 90  BUN 12 17 17 18   CREATININE 0.69 0.8 0.74 0.72  CALCIUM 8.6*  --  9.7 9.7   Liver Function Tests:  Recent Labs  08/15/16 0442 02/19/17 0001 07/23/17 0814  AST 21 16 15   ALT 16 14 13   ALKPHOS 50 61 65  BILITOT 0.6 0.4 0.4  PROT 6.2* 6.5 6.7  ALBUMIN 3.4* 3.9 4.0   No results for input(s): LIPASE, AMYLASE in the last 8760 hours. No results for input(s): AMMONIA in the last 8760 hours. CBC:  Recent Labs  08/14/16 1617 08/15/16 0442 09/26/16 07/23/17 0814  WBC 16.4* 8.7 6.5 6.1  NEUTROABS  --   --   --  3,050  HGB 13.3 11.7* 13.3 13.6  HCT 38.7 34.0* 40 40.0  MCV 87.8 88.3  --  88.9  PLT 306 278 370 327   Cardiac Enzymes: No results for input(s): CKTOTAL, CKMB, CKMBINDEX, TROPONINI in the last 8760 hours. BNP: Invalid input(s): POCBNP No results found for: HGBA1C Lab Results  Component Value Date   TSH 0.20 (L) 07/23/2017   No results found for: VITAMINB12 No results found for: FOLATE No results found for: IRON, TIBC, FERRITIN  Lipid Panel:  Recent Labs  07/23/17 0814  CHOL 190  HDL 48*  LDLCALC 110*  TRIG 159*  CHOLHDL 4.0   No results found for: HGBA1C  Procedures since last visit: No results found.   08/13/17 MMSE 28/30, passed clock draw  Assessment/Plan  1. Postoperative hypothyroidism Continue levothyroxine 112 mcg daily per endo recs. Will need a recheck of TSH. Pt to verify with dr Forde Dandy on next lab check.  - CMP with eGFR; Future - Lipid Panel; Future - TSH; Future  2. Mixed hyperlipidemia Not on statin. With her age, advised diet and activity for now. Recheck in 6 months - Lipid Panel; Future  3. Prolapse of uterus On pelvic exam, prolapse  felt. Advised on kegel exercise for now. No new urinary complaint or pelvic pain. Pt defers gyn visit for now. Monitor clinically.   4. Memory loss MMSE 28/30, passed clock draw. Has post operative hypothyroidism, monitor clinically  5. Grief at loss of child Lost her step daughter last week. Tearful this visit. Mentions it is per god's wishes and is glad that her step daughter did not have to suffer. Says she is coping fine. Monitor her mood. Advised her husband to notify if notices signs of depression. Husband and pt both understands.   6. Midline low back pain without sciatica, unspecified chronicity Worsens when in bed lying in 1 position. Improves with activity, likely DDD. No claudication or radiation of pain to her legs. Advised to take tylenol 500 mg qhs x 1 week and then daily as needed.     Next appointment: 6 months or earlier if needed  Communication: reviewed care plan with patient and husband   I spent 40 minutes in total face-to-face time with the patient, more than 50% of which was spent in counseling and coordination of care, reviewing test results, reviewing medication and discussing or reviewing the diagnosis with patient.       Blanchie Serve, MD Internal Medicine [facility] [address] [place], [place] [address] Cell Phone (Monday-Friday 8 am - 5 pm): [phone] On Call: [phone] and follow prompts after 5 pm and on weekends Office Phone: [phone] Office Fax: [phone]

## 2017-08-14 ENCOUNTER — Telehealth: Payer: Self-pay | Admitting: *Deleted

## 2017-08-14 NOTE — Telephone Encounter (Signed)
Patient called and stated that she was seen yesterday. Stated that she had a rectal exam and the foot of the exam table wouldn't adjust so her legs hung down. Patient wanted to let you know that after the visit she went home and stated that she had the worse back pain than ever. Stated that she took Tylenol 500mg  yesterday evening and this morning. Stated that the back pain is alittle better this morning. Patient wanted to let you know.

## 2017-08-14 NOTE — Telephone Encounter (Signed)
Thank you for letting me know. It is good that tylenol helped her.

## 2017-09-15 ENCOUNTER — Other Ambulatory Visit: Payer: Self-pay | Admitting: Internal Medicine

## 2017-10-10 ENCOUNTER — Non-Acute Institutional Stay: Payer: PPO

## 2017-10-10 VITALS — BP 126/60 | HR 94 | Temp 97.7°F | Ht 64.0 in | Wt 163.0 lb

## 2017-10-10 DIAGNOSIS — Z0000 Encounter for general adult medical examination without abnormal findings: Secondary | ICD-10-CM | POA: Diagnosis not present

## 2017-10-10 NOTE — Progress Notes (Signed)
Subjective:   Frances Holland is a 83 y.o. female who presents for an Initial Medicare Annual Wellness Visit at [facility]       Objective:    Today's Vitals   10/10/17 0933  BP: 126/60  Pulse: 94  Temp: 97.7 F (36.5 C)  TempSrc: Oral  SpO2: 97%  Weight: 163 lb (73.9 kg)  Height: 5\' 4"  (1.626 m)   Body mass index is 27.98 kg/m.   Current Medications (verified) Outpatient Encounter Prescriptions as of 10/10/2017  Medication Sig  . acetaminophen (TYLENOL) 500 MG tablet Take 1 tablet (500 mg total) by mouth at bedtime. For 1 week, then daily as needed for back pain.  Marland Kitchen amLODipine (NORVASC) 2.5 MG tablet TAKE ONE TABLET BY MOUTH ONCE DAILY TO LOWER BLOOD PRESSURE  . aspirin EC 81 MG tablet Take 81 mg by mouth daily.  . Calcium Carb-Cholecalciferol (CALCIUM 600-D PO) Take 1 tablet by mouth daily.  . Cholecalciferol 5000 units capsule Take 5,000 Units by mouth daily.  Marland Kitchen levothyroxine (SYNTHROID, LEVOTHROID) 112 MCG tablet Take 112 mcg by mouth daily before breakfast.  . losartan-hydrochlorothiazide (HYZAAR) 100-12.5 MG tablet TAKE 1 TABLET BY MOUTH ONCE DAILY IN THE MORNING FOR BLOOD PRESSURE  . Melatonin 3 MG TABS Take 6 mg by mouth at bedtime.   . Multiple Vitamins-Minerals (CENTRUM SILVER PO) Take 1 tablet by mouth daily.   . Omega-3 Fatty Acids (OMEGA-3 CF PO) Take 530 mg by mouth daily.  . pneumococcal 13-valent conjugate vaccine (PREVNAR 13) SUSP injection Inject 0.5 mLs into the muscle tomorrow at 10 am.  . Polyethyl Glycol-Propyl Glycol (SYSTANE) 0.4-0.3 % SOLN Apply to eye. Use two drops of Systane as needed for dry eyes  . Simethicone (GAS RELIEF 125 MAX ST PO) Take by mouth. Take 1-2 tablets as needed after meals   No facility-administered encounter medications on file as of 10/10/2017.     Allergies (verified) Sulfa antibiotics and Sulfamethoxazole   History: Past Medical History:  Diagnosis Date  . Back pain   . Cancer [facility])      left breaset mastectomy   . Candidiasis of skin and nails   . Cellulitis and abscess of upper arm and forearm   . Heartburn   . Hemorrhoids 08/20/2016  . History of breast cancer 2000   History left mastectomy  . Hurthle cell adenocarcinoma ([place]) 08/20/2016  . Hypertension   . Insomnia   . Knee pain, bilateral 2014  . Lymphedema    Left arm following mastectomy  . Memory impairment   . Memory loss   . Migraine 05/16/2012  . Migraine without aura, without mention of intractable migraine without mention of status migrainosus    patient denies at preop of 08/08/16   . Other and unspecified hyperlipidemia   . Other bursitis disorders   . Other infective bursitis, left shoulder   . Pain in joint, ankle and foot   . Pain in joint, hand   . Pain in joint, lower leg   . Pain in joint, shoulder region   . Pain in joint, shoulder region 04/23/2016  . Pain in limb   . Rectal bleeding 08/20/2016  . Senile osteoporosis   . Symptomatic menopausal or female climacteric states   . Syncope and collapse   . Unspecified hypertensive heart disease without heart failure 1995  . Unspecified hypothyroidism   . Unspecified polyarthropathy or polyarthritis, site unspecified   . Unspecified urinary incontinence   . Unspecified visual  loss   . Xerophthalmia    Past Surgical History:  Procedure Laterality Date  . BILATERAL OOPHORECTOMY  1981  . BREAST RECONSTRUCTION Left 01/2000  . BREAST REDUCTION SURGERY Right 01/2000  . CATARACT EXTRACTION Right 09/04/2005  . COLONOSCOPY  01/2004  . MASTECTOMY Left 10/1999  . THYROID LOBECTOMY Left 08/14/2016   Procedure: NEAR TOTAL THYROIDECTOMY;  Surgeon: Johnathan Hausen, MD;  Location: [facility];  Service: General;  Laterality: Left;  . TOTAL ABDOMINAL HYSTERECTOMY W/ BILATERAL SALPINGOOPHORECTOMY  1981  . TOTAL KNEE ARTHROPLASTY Right 2014   Family History  Problem Relation Age of Onset  . Heart disease Mother   . Asthma Son    Social History   Occupational  History  . retired Estate agent     Social History Main Topics  . Smoking status: Never Smoker  . Smokeless tobacco: Never Used  . Alcohol use No  . Drug use: No  . Sexual activity: Not on file    Tobacco Counseling Counseling given: Not Answered   Activities of Daily Living In your present state of health, do you have any difficulty performing the following activities: 10/10/2017  Hearing? N  Vision? N  Difficulty concentrating or making decisions? Y  Walking or climbing stairs? N  Dressing or bathing? N  Doing errands, shopping? Y  Preparing Food and eating ? N  Using the Toilet? N  In the past six months, have you accidently leaked urine? N  Comment urinary frequency, wears pads  Do you have problems with loss of bowel control? N  Managing your Medications? Y  Managing your Finances? Y  Housekeeping or managing your Housekeeping? N  Some recent data might be hidden    Immunizations and Health Maintenance Immunization History  Administered Date(s) Administered  . Influenza, High Dose Seasonal PF 08/30/2017  . Influenza-Unspecified 08/10/2013, 09/07/2015, 09/13/2016  . Pneumococcal Polysaccharide-23 12/09/2002  . Tdap 02/21/2014  . Zoster Recombinat (Shingrix) 07/18/2017   Health Maintenance Due  Topic Date Due  . PNA vac Low Risk Adult (2 of 2 - PCV13) 12/10/2003    Patient Care Team: Blanchie Serve, MD as PCP - General (Internal Medicine) Reynold Bowen, MD as Consulting Physician (Endocrinology)  Indicate any recent Medical Services you may have received from other than [facility] providers in the past year (date may be approximate).     Assessment:   This is a routine wellness examination for [name].    Hearing/Vision screen No exam data present  Dietary issues and exercise activities discussed: Current Exercise Habits: Home exercise routine, Type of exercise: walking;stretching, Time (Minutes): 15, Frequency (Times/Week): 7, Weekly Exercise  (Minutes/Week): 105, Intensity: Mild, Exercise limited by: None identified  Goals    . Maintain LIfestyle          Starting today pt will maintain lifestyle.       Depression Screen PHQ 2/9 Scores 10/10/2017 08/13/2017 05/14/2016 05/16/2015 08/02/2014  PHQ - 2 Score 0 0 0 0 0  PHQ- 9 Score - 0 - - -    Fall Risk Fall Risk  10/10/2017 05/14/2016 01/23/2016 11/14/2015 05/16/2015  Falls in the past year? No No No No No    Cognitive Function: MMSE - Mini Mental State Exam 08/13/2017 08/13/2017 11/26/2016 05/14/2016 05/16/2015  Orientation to time 5 5 4 5 5   Orientation to Place 3 3 5 5 5   Registration 3 3 3 3 3   Attention/ Calculation 5 5 5 5 5   Recall 3 3 3 2  0  Language- name  2 objects 2 2 2 2 2   Language- repeat 1 1 1 1 1   Language- follow 3 step command 3 3 3 3 3   Language- read & follow direction 1 1 1 1 1   Write a sentence 1 1 1 1 1   Copy design 1 1 1 1  0  Total score 28 28 29 29 26         Screening Tests Health Maintenance  Topic Date Due  . PNA vac Low Risk Adult (2 of 2 - PCV13) 12/10/2003  . TETANUS/TDAP  02/22/2024  . INFLUENZA VACCINE  Completed  . DEXA SCAN  Completed      Plan:    I have personally reviewed and addressed the Medicare Annual Wellness questionnaire and have noted the following in the patient's chart:  A. Medical and social history B. Use of alcohol, tobacco or illicit drugs  C. Current medications and supplements D. Functional ability and status E.  Nutritional status F.  Physical activity G. Advance directives H. List of other physicians I.  Hospitalizations, surgeries, and ER visits in previous 12 months J.  [place] to include hearing, vision, cognitive, depression L. Referrals and appointments - none  In addition, I have reviewed and discussed with patient certain preventive protocols, quality metrics, and best practice recommendations. A written personalized care plan for preventive services as well as general preventive health  recommendations were provided to patient.  See attached scanned questionnaire for additional information.   Signed,   Rich Reining, RN Nurse Health Advisor   Quick Notes   Health Maintenance: PNA 13 prescription sent to pharmacy. 2nd Shingrix vaccine due between now and February 2019     Abnormal Screen: MMSE 28/30 on 08/13/2017     Patient Concerns: None     Nurse Concerns: None

## 2017-10-10 NOTE — Patient Instructions (Signed)
Frances Holland , Thank you for taking time to come for your Medicare Wellness Visit. I appreciate your ongoing commitment to your health goals. Please review the following plan we discussed and let me know if I can assist you in the future.   Screening recommendations/referrals: Colonoscopy excluded, you are over age 81 Mammogram up to date. Due August 2019 Bone Density up to date Recommended yearly ophthalmology/optometry visit for glaucoma screening and checkup Recommended yearly dental visit for hygiene and checkup  Vaccinations: Influenza vaccine up to date. Due 2019 fall season Pneumococcal vaccine 13 due, prescription sent to pharmacy Tdap vaccine up to date. Due 02/22/2024 Shingles vaccine up to date. Second shot due between now and February 2019    Advanced directives: In Chart  Conditions/risks identified: None  Next appointment: Dr. Bubba Camp 02/11/2018 @ 11am   Preventive Care 65 Years and Older, Female Preventive care refers to lifestyle choices and visits with your health care provider that can promote health and wellness. What does preventive care include?  A yearly physical exam. This is also called an annual well check.  Dental exams once or twice a year.  Routine eye exams. Ask your health care provider how often you should have your eyes checked.  Personal lifestyle choices, including:  Daily care of your teeth and gums.  Regular physical activity.  Eating a healthy diet.  Avoiding tobacco and drug use.  Limiting alcohol use.  Practicing safe sex.  Taking low-dose aspirin every day.  Taking vitamin and mineral supplements as recommended by your health care provider. What happens during an annual well check? The services and screenings done by your health care provider during your annual well check will depend on your age, overall health, lifestyle risk factors, and family history of disease. Counseling  Your health care provider may ask you questions about  your:  Alcohol use.  Tobacco use.  Drug use.  Emotional well-being.  Home and relationship well-being.  Sexual activity.  Eating habits.  History of falls.  Memory and ability to understand (cognition).  Work and work Statistician.  Reproductive health. Screening  You may have the following tests or measurements:  Height, weight, and BMI.  Blood pressure.  Lipid and cholesterol levels. These may be checked every 5 years, or more frequently if you are over 85 years old.  Skin check.  Lung cancer screening. You may have this screening every year starting at age 43 if you have a 30-pack-year history of smoking and currently smoke or have quit within the past 15 years.  Fecal occult blood test (FOBT) of the stool. You may have this test every year starting at age 64.  Flexible sigmoidoscopy or colonoscopy. You may have a sigmoidoscopy every 5 years or a colonoscopy every 10 years starting at age 80.  Hepatitis C blood test.  Hepatitis B blood test.  Sexually transmitted disease (STD) testing.  Diabetes screening. This is done by checking your blood sugar (glucose) after you have not eaten for a while (fasting). You may have this done every 1-3 years.  Bone density scan. This is done to screen for osteoporosis. You may have this done starting at age 3.  Mammogram. This may be done every 1-2 years. Talk to your health care provider about how often you should have regular mammograms. Talk with your health care provider about your test results, treatment options, and if necessary, the need for more tests. Vaccines  Your health care provider may recommend certain vaccines, such as:  Influenza vaccine. This is recommended every year.  Tetanus, diphtheria, and acellular pertussis (Tdap, Td) vaccine. You may need a Td booster every 10 years.  Zoster vaccine. You may need this after age 73.  Pneumococcal 13-valent conjugate (PCV13) vaccine. One dose is recommended  after age 68.  Pneumococcal polysaccharide (PPSV23) vaccine. One dose is recommended after age 75. Talk to your health care provider about which screenings and vaccines you need and how often you need them. This information is not intended to replace advice given to you by your health care provider. Make sure you discuss any questions you have with your health care provider. Document Released: 12/22/2015 Document Revised: 08/14/2016 Document Reviewed: 09/26/2015 Elsevier Interactive Patient Education  2017 [employer] Prevention in the Home Falls can cause injuries. They can happen to people of all ages. There are many things you can do to make your home safe and to help prevent falls. What can I do on the outside of my home?  Regularly fix the edges of walkways and driveways and fix any cracks.  Remove anything that might make you trip as you walk through a door, such as a raised step or threshold.  Trim any bushes or trees on the path to your home.  Use bright outdoor lighting.  Clear any walking paths of anything that might make someone trip, such as rocks or tools.  Regularly check to see if handrails are loose or broken. Make sure that both sides of any steps have handrails.  Any raised decks and porches should have guardrails on the edges.  Have any leaves, snow, or ice cleared regularly.  Use sand or salt on walking paths during winter.  Clean up any spills in your garage right away. This includes oil or grease spills. What can I do in the bathroom?  Use night lights.  Install grab bars by the toilet and in the tub and shower. Do not use towel bars as grab bars.  Use non-skid mats or decals in the tub or shower.  If you need to sit down in the shower, use a plastic, non-slip stool.  Keep the floor dry. Clean up any water that spills on the floor as soon as it happens.  Remove soap buildup in the tub or shower regularly.  Attach bath mats securely with  double-sided non-slip rug tape.  Do not have throw rugs and other things on the floor that can make you trip. What can I do in the bedroom?  Use night lights.  Make sure that you have a light by your bed that is easy to reach.  Do not use any sheets or blankets that are too big for your bed. They should not hang down onto the floor.  Have a firm chair that has side arms. You can use this for support while you get dressed.  Do not have throw rugs and other things on the floor that can make you trip. What can I do in the kitchen?  Clean up any spills right away.  Avoid walking on wet floors.  Keep items that you use a lot in easy-to-reach places.  If you need to reach something above you, use a strong step stool that has a grab bar.  Keep electrical cords out of the way.  Do not use floor polish or wax that makes floors slippery. If you must use wax, use non-skid floor wax.  Do not have throw rugs and other things on the floor that  can make you trip. What can I do with my stairs?  Do not leave any items on the stairs.  Make sure that there are handrails on both sides of the stairs and use them. Fix handrails that are broken or loose. Make sure that handrails are as long as the stairways.  Check any carpeting to make sure that it is firmly attached to the stairs. Fix any carpet that is loose or worn.  Avoid having throw rugs at the top or bottom of the stairs. If you do have throw rugs, attach them to the floor with carpet tape.  Make sure that you have a light switch at the top of the stairs and the bottom of the stairs. If you do not have them, ask someone to add them for you. What else can I do to help prevent falls?  Wear shoes that:  Do not have high heels.  Have rubber bottoms.  Are comfortable and fit you well.  Are closed at the toe. Do not wear sandals.  If you use a stepladder:  Make sure that it is fully opened. Do not climb a closed stepladder.  Make  sure that both sides of the stepladder are locked into place.  Ask someone to hold it for you, if possible.  Clearly mark and make sure that you can see:  Any grab bars or handrails.  First and last steps.  Where the edge of each step is.  Use tools that help you move around (mobility aids) if they are needed. These include:  Canes.  Walkers.  Scooters.  Crutches.  Turn on the lights when you go into a dark area. Replace any light bulbs as soon as they burn out.  Set up your furniture so you have a clear path. Avoid moving your furniture around.  If any of your floors are uneven, fix them.  If there are any pets around you, be aware of where they are.  Review your medicines with your doctor. Some medicines can make you feel dizzy. This can increase your chance of falling. Ask your doctor what other things that you can do to help prevent falls. This information is not intended to replace advice given to you by your health care provider. Make sure you discuss any questions you have with your health care provider. Document Released: 09/21/2009 Document Revised: 05/02/2016 Document Reviewed: 12/30/2014 Elsevier Interactive Patient Education  2017 [employer].

## 2017-10-13 ENCOUNTER — Ambulatory Visit: Payer: PPO | Admitting: Podiatry

## 2017-10-24 ENCOUNTER — Encounter: Payer: Self-pay | Admitting: Podiatry

## 2017-10-24 ENCOUNTER — Ambulatory Visit: Payer: PPO | Admitting: Podiatry

## 2017-10-24 DIAGNOSIS — M779 Enthesopathy, unspecified: Secondary | ICD-10-CM

## 2017-10-24 DIAGNOSIS — M2042 Other hammer toe(s) (acquired), left foot: Secondary | ICD-10-CM | POA: Diagnosis not present

## 2017-10-24 DIAGNOSIS — M2010 Hallux valgus (acquired), unspecified foot: Secondary | ICD-10-CM | POA: Diagnosis not present

## 2017-10-24 DIAGNOSIS — M2041 Other hammer toe(s) (acquired), right foot: Secondary | ICD-10-CM | POA: Diagnosis not present

## 2017-10-29 NOTE — Progress Notes (Signed)
Subjective:    Patient ID: Frances Holland, female   DOB: 83 y.o.   MRN: [id number]   HPI patient presents with discomfort of the right second digit stating that it's been bothering her for a while and did not do well with trimming technique with pain still present    ROS      Objective:  Physical Exam neurovascular status intact with keratotic lesion of the left it's very painful when pressed     Assessment:    Chronic lesion formation with digital deformity     Plan:     H&P condition reviewed and debrided lesion and padded and discussed possible surgical intervention in this case and explained and educated her on this.

## 2017-12-10 ENCOUNTER — Encounter: Payer: Self-pay | Admitting: Internal Medicine

## 2017-12-12 DIAGNOSIS — E890 Postprocedural hypothyroidism: Secondary | ICD-10-CM | POA: Diagnosis not present

## 2017-12-12 DIAGNOSIS — C73 Malignant neoplasm of thyroid gland: Secondary | ICD-10-CM | POA: Diagnosis not present

## 2017-12-12 DIAGNOSIS — Z1389 Encounter for screening for other disorder: Secondary | ICD-10-CM | POA: Diagnosis not present

## 2017-12-12 DIAGNOSIS — I10 Essential (primary) hypertension: Secondary | ICD-10-CM | POA: Diagnosis not present

## 2017-12-12 DIAGNOSIS — M545 Low back pain: Secondary | ICD-10-CM | POA: Diagnosis not present

## 2017-12-12 DIAGNOSIS — Z6828 Body mass index (BMI) 28.0-28.9, adult: Secondary | ICD-10-CM | POA: Diagnosis not present

## 2017-12-12 DIAGNOSIS — E7849 Other hyperlipidemia: Secondary | ICD-10-CM | POA: Diagnosis not present

## 2017-12-12 DIAGNOSIS — L97529 Non-pressure chronic ulcer of other part of left foot with unspecified severity: Secondary | ICD-10-CM | POA: Diagnosis not present

## 2017-12-12 DIAGNOSIS — M810 Age-related osteoporosis without current pathological fracture: Secondary | ICD-10-CM | POA: Diagnosis not present

## 2017-12-16 ENCOUNTER — Other Ambulatory Visit: Payer: Self-pay | Admitting: Internal Medicine

## 2017-12-17 ENCOUNTER — Ambulatory Visit: Payer: PPO | Admitting: Podiatry

## 2017-12-17 ENCOUNTER — Ambulatory Visit (INDEPENDENT_AMBULATORY_CARE_PROVIDER_SITE_OTHER): Payer: PPO

## 2017-12-17 ENCOUNTER — Other Ambulatory Visit: Payer: Self-pay | Admitting: Podiatry

## 2017-12-17 DIAGNOSIS — M79672 Pain in left foot: Secondary | ICD-10-CM

## 2017-12-17 DIAGNOSIS — M2042 Other hammer toe(s) (acquired), left foot: Secondary | ICD-10-CM | POA: Diagnosis not present

## 2017-12-17 DIAGNOSIS — M2041 Other hammer toe(s) (acquired), right foot: Secondary | ICD-10-CM

## 2017-12-17 DIAGNOSIS — M722 Plantar fascial fibromatosis: Secondary | ICD-10-CM | POA: Diagnosis not present

## 2017-12-17 MED ORDER — DOXYCYCLINE HYCLATE 100 MG PO TABS: 100.0000 mg | ORAL_TABLET | Freq: Two times a day (BID) | ORAL | 0 refills | Status: DC

## 2017-12-17 NOTE — Addendum Note (Signed)
Addended by: Wallene Huh on: 12/17/2017 05:53 PM   Modules accepted: Orders

## 2017-12-17 NOTE — Progress Notes (Signed)
Subjective:   Patient ID: Gevena Mart, female   DOB: 84 y.o.   MRN: [id number]   HPI Patient presents with chronic pain in the second digit left that we had tried trimming that did not give relief she is having continued problems with it.  Has been on antibiotic the last few days which is diminished redness but the toe continues to be bothersome with shoe gear   ROS      Objective:  Physical Exam  Neurovascular status intact with patient's left second toe showing keratotic lesion on the inside of the toe that is painful when pressed and make shoe gear difficult.  There is slight redness to the toe but there is no proximal edema erythema or drainage noted.     Assessment:  Hammertoe deformity second digit right that is irritated with keratotic lesion formation and possible localized mild infection     Plan:  Reviewed case at great length with patient and husband at this point I do think a digital arthroplasty will be necessary.  I reviewed procedure that will be necessary and I allow patient to read consent form going over alternative treatments complications.  We are going to use padding to keep the pressure off the toe and she will continue antibiotics for 10 more days and surgical tenably scheduled for the next 3 weeks and she is encouraged to call with questions that he should arise prior to surgery

## 2017-12-17 NOTE — Patient Instructions (Signed)
Pre-Operative Instructions  Congratulations, you have decided to take an important step towards improving your quality of life.  You can be assured that the doctors and staff at [facility] will be with you every step of the way.  Here are some important things you should know:  1. Plan to be at the surgery center/hospital at least 1 (one) hour prior to your scheduled time, unless otherwise directed by the surgical center/hospital staff.  You must have a responsible adult accompany you, remain during the surgery and drive you home.  Make sure you have directions to the surgical center/hospital to ensure you arrive on time. 2. If you are having surgery at [facility] or [facility], you will need a copy of your medical history and physical form from your family physician within one month prior to the date of surgery. We will give you a form for your primary physician to complete.  3. We make every effort to accommodate the date you request for surgery.  However, there are times where surgery dates or times have to be moved.  We will contact you as soon as possible if a change in schedule is required.   4. No aspirin/ibuprofen for one week before surgery.  If you are on aspirin, any non-steroidal anti-inflammatory medications (Mobic, Aleve, Ibuprofen) should not be taken seven (7) days prior to your surgery.  You make take Tylenol for pain prior to surgery.  5. Medications - If you are taking daily heart and blood pressure medications, seizure, reflux, allergy, asthma, anxiety, pain or diabetes medications, make sure you notify the surgery center/hospital before the day of surgery so they can tell you which medications you should take or avoid the day of surgery. 6. No food or drink after midnight the night before surgery unless directed otherwise by surgical center/hospital staff. 7. No alcoholic beverages 24-hours prior to surgery.  No smoking 24-hours prior or 24-hours after  surgery. 8. Wear loose pants or shorts. They should be loose enough to fit over bandages, boots, and casts. 9. Don't wear slip-on shoes. Sneakers are preferred. 10. Bring your boot with you to the surgery center/hospital.  Also bring crutches or a walker if your physician has prescribed it for you.  If you do not have this equipment, it will be provided for you after surgery. 11. If you have not been contacted by the surgery center/hospital by the day before your surgery, call to confirm the date and time of your surgery. 12. Leave-time from work may vary depending on the type of surgery you have.  Appropriate arrangements should be made prior to surgery with your employer. 13. Prescriptions will be provided immediately following surgery by your doctor.  Fill these as soon as possible after surgery and take the medication as directed. Pain medications will not be refilled on weekends and must be approved by the doctor. 14. Remove nail polish on the operative foot and avoid getting pedicures prior to surgery. 15. Wash the night before surgery.  The night before surgery wash the foot and leg well with water and the antibacterial soap provided. Be sure to pay special attention to beneath the toenails and in between the toes.  Wash for at least three (3) minutes. Rinse thoroughly with water and dry well with a towel.  Perform this wash unless told not to do so by your physician.  Enclosed: 1 Ice pack (please put in freezer the night before surgery)   1 Hibiclens skin cleaner     Pre-op instructions  If you have any questions regarding the instructions, please do not hesitate to call our office.  [place]: [address], [place], [place] [address] -- [phone]  [place]: [address] [place], [place] [address] -- [phone]  [place]: [address]  , [place] [address] -- [phone]  [place]: [address], [address], [place], [place] [address] -- [phone]  Website:  [email or web address] 

## 2017-12-22 ENCOUNTER — Telehealth: Payer: Self-pay | Admitting: *Deleted

## 2017-12-22 NOTE — Telephone Encounter (Signed)
Patient husband called and stated that patient wants Dr. Jackolyn Confer opinion regarding surgery on her 2nd toe left foot. Stated that Dr. Jerrilyn Cairo wants to do surgery and patient wants your opinion. Please Advise.

## 2017-12-22 NOTE — Telephone Encounter (Signed)
I reviewed clinic note from your foot doctor's office visit. I would recommend proceeding with the surgery to help with pain and discomfort to your toe. It will help you

## 2017-12-23 ENCOUNTER — Telehealth: Payer: Self-pay | Admitting: *Deleted

## 2017-12-23 NOTE — Telephone Encounter (Signed)
Spoke with patients husband and she agreed with the instructions given by Dr. Bubba Camp. He was happy that we responded so quickly.

## 2017-12-23 NOTE — Telephone Encounter (Signed)
"  My name is Frances Holland.  I'm calling for my wife, Frances Holland.  We saw Dr. Paulla Dolly last Wednesday and he suggested surgery for my wife.  We're ready to go ahead at this stage.  The only question we have at this time is that we want to make sure we have a meeting prior to surgery.  He said last week that he still had an opening at the end of January.  We'd appreciate your call.  Thank you very much."

## 2017-12-24 NOTE — Telephone Encounter (Signed)
"  I'm calling to schedule my wife's surgery for the end of the month."  Dr. Paulla Dolly doesn't have anything until February 5.  "Okay we'll take it and we'd like to see Dr. Paulla Dolly again before the surgery to ask him some questions."  I will get her surgery scheduled.  I can transfer you to a scheduler so you can schedule her an appointment.  "That would be great."

## 2017-12-27 ENCOUNTER — Other Ambulatory Visit: Payer: Self-pay | Admitting: Internal Medicine

## 2017-12-31 ENCOUNTER — Ambulatory Visit: Payer: PPO | Admitting: Podiatry

## 2018-01-01 ENCOUNTER — Telehealth: Payer: Self-pay | Admitting: *Deleted

## 2018-01-01 NOTE — Telephone Encounter (Signed)
I am calling in regards to your wife's upcoming surgery.  I see you canceled the consultation.  You said you wanted to see Dr. Paulla Dolly again before she had surgery because you have questions.  "We just had questions about the surgical pamphlet.  We had an old pamphlet.  I called and got everything straightened out."  Well you wife didn't sign consent forms when you were here for her visit.  Dr. Paulla Dolly had explained the consent to you so you can come by to sign it or if you feel you need more information, schedule an appointment to see Dr. Paulla Dolly.  "We'll schedule an appointment."  Would you like me to transfer you to a scheduler?  "Yes, you can transfer me."  We got disconnected.  I asked Steffanie Dunn to give him a call and schedule her for a consultation.

## 2018-01-02 ENCOUNTER — Ambulatory Visit: Payer: PPO | Admitting: Podiatry

## 2018-01-02 ENCOUNTER — Encounter: Payer: Self-pay | Admitting: Podiatry

## 2018-01-02 DIAGNOSIS — M2041 Other hammer toe(s) (acquired), right foot: Secondary | ICD-10-CM

## 2018-01-02 DIAGNOSIS — M2042 Other hammer toe(s) (acquired), left foot: Secondary | ICD-10-CM

## 2018-01-02 NOTE — Progress Notes (Signed)
Subjective:   Patient ID: Frances Holland, female   DOB: 84 y.o.   MRN: [id number]   HPI Patient presents with significant chronic hammertoe deformity second left which is simply not gotten better and is currently has no drainage or redness but is very painful   ROS      Objective:  Physical Exam  Neurovascular status intact with chronic keratotic lesion digit to left is not responded conservatively     Assessment:  Hammertoe deformity second left with chronic lesion formation and interdigital keratotic tissue formation     Plan:  Reviewed again at great length what would be required and answered numerous questions concerning arthroplasty including the fact there is no long-term guarantees in any kinds of complications can occur with this kind of a problem.  Patient wants procedure understanding risk and today patient read signed consent form and was given all preoperative instructions for surgery

## 2018-01-02 NOTE — Patient Instructions (Signed)
Pre-Operative Instructions  Congratulations, you have decided to take an important step towards improving your quality of life.  You can be assured that the doctors and staff at [facility] will be with you every step of the way.  Here are some important things you should know:  1. Plan to be at the surgery center/hospital at least 1 (one) hour prior to your scheduled time, unless otherwise directed by the surgical center/hospital staff.  You must have a responsible adult accompany you, remain during the surgery and drive you home.  Make sure you have directions to the surgical center/hospital to ensure you arrive on time. 2. If you are having surgery at [facility] or [facility], you will need a copy of your medical history and physical form from your family physician within one month prior to the date of surgery. We will give you a form for your primary physician to complete.  3. We make every effort to accommodate the date you request for surgery.  However, there are times where surgery dates or times have to be moved.  We will contact you as soon as possible if a change in schedule is required.   4. No aspirin/ibuprofen for one week before surgery.  If you are on aspirin, any non-steroidal anti-inflammatory medications (Mobic, Aleve, Ibuprofen) should not be taken seven (7) days prior to your surgery.  You make take Tylenol for pain prior to surgery.  5. Medications - If you are taking daily heart and blood pressure medications, seizure, reflux, allergy, asthma, anxiety, pain or diabetes medications, make sure you notify the surgery center/hospital before the day of surgery so they can tell you which medications you should take or avoid the day of surgery. 6. No food or drink after midnight the night before surgery unless directed otherwise by surgical center/hospital staff. 7. No alcoholic beverages 24-hours prior to surgery.  No smoking 24-hours prior or 24-hours after  surgery. 8. Wear loose pants or shorts. They should be loose enough to fit over bandages, boots, and casts. 9. Don't wear slip-on shoes. Sneakers are preferred. 10. Bring your boot with you to the surgery center/hospital.  Also bring crutches or a walker if your physician has prescribed it for you.  If you do not have this equipment, it will be provided for you after surgery. 11. If you have not been contacted by the surgery center/hospital by the day before your surgery, call to confirm the date and time of your surgery. 12. Leave-time from work may vary depending on the type of surgery you have.  Appropriate arrangements should be made prior to surgery with your employer. 13. Prescriptions will be provided immediately following surgery by your doctor.  Fill these as soon as possible after surgery and take the medication as directed. Pain medications will not be refilled on weekends and must be approved by the doctor. 14. Remove nail polish on the operative foot and avoid getting pedicures prior to surgery. 15. Wash the night before surgery.  The night before surgery wash the foot and leg well with water and the antibacterial soap provided. Be sure to pay special attention to beneath the toenails and in between the toes.  Wash for at least three (3) minutes. Rinse thoroughly with water and dry well with a towel.  Perform this wash unless told not to do so by your physician.  Enclosed: 1 Ice pack (please put in freezer the night before surgery)   1 Hibiclens skin cleaner     Pre-op instructions  If you have any questions regarding the instructions, please do not hesitate to call our office.  [place]: [address], [place], [place] [address] -- [phone]  [place]: [address] [place], [place] [address] -- [phone]  [place]: [address]  [place], [place] [address] -- [phone]  [place]: [address], [place], [place] [address] -- [phone]  Website:  [email or web address] 

## 2018-01-05 ENCOUNTER — Telehealth: Payer: Self-pay | Admitting: *Deleted

## 2018-01-05 NOTE — Telephone Encounter (Addendum)
"  My wife is scheduled for surgery next week.  I need to know if she should stop her Synthroid, Metformin and her high blood pressure medications."  That is a question you need to ask the nurse from the surgical center when she calls you.  She normally calls a day or two before the scheduled appointment.  "Her arrival time isn't until 10 am.  That's a long time to go without eating anything.  Is it okay for her to eat a light breakfast, like a dry piece of toast with jelly and a cup of coffee?"  No, she is not to eat or drink anything after midnight the night before.  "We got the ice pack.  Does she take that with her to the surgery center or does she start using it once she gets home from surgery?"  She will start using it once she gets home from surgery.

## 2018-01-13 ENCOUNTER — Encounter: Payer: Self-pay | Admitting: Podiatry

## 2018-01-13 ENCOUNTER — Telehealth: Payer: Self-pay | Admitting: *Deleted

## 2018-01-13 DIAGNOSIS — M2042 Other hammer toe(s) (acquired), left foot: Secondary | ICD-10-CM | POA: Diagnosis not present

## 2018-01-13 DIAGNOSIS — I10 Essential (primary) hypertension: Secondary | ICD-10-CM | POA: Diagnosis not present

## 2018-01-13 NOTE — Telephone Encounter (Signed)
Pt's husband, [name] states Dr. Paulla Dolly failed to sign pt's prescription.

## 2018-01-13 NOTE — Telephone Encounter (Signed)
I informed [facility] stated they do not have the prescription. [name] states the prescription was dropped at the pharmacy by their son. I told [facility] Al[facility] says they do not have it, and if he will bring it to me I will sign to be filled. [name] states he will get his son to Mr. Troutman to bring the prescription to be signed. I told him if I am at lunch I would be happy for someone to get me to sign.

## 2018-01-13 NOTE — Telephone Encounter (Signed)
Frances Holland [phone] states pt's husband, [name] took the prescription back.

## 2018-01-14 ENCOUNTER — Telehealth: Payer: Self-pay | Admitting: *Deleted

## 2018-01-14 NOTE — Telephone Encounter (Signed)
Called and spoke with patient and patient is doing well and took a pain pill last night at 11:15 pm and took another today at 10:45 am and hurts some with walking which I stated to limit to about 15 minutes on the hour and to elevate and ice and patient states that she has some cotton under the big toe to raise it up and is walking on heel and I stated to try and walk normal if she could and there is no fever or chills and no nausea and to call the office with any concerns or questions and we would see her next week for surgery check. Lattie Haw

## 2018-01-21 ENCOUNTER — Ambulatory Visit (INDEPENDENT_AMBULATORY_CARE_PROVIDER_SITE_OTHER): Payer: PPO | Admitting: Podiatry

## 2018-01-21 ENCOUNTER — Encounter: Payer: Self-pay | Admitting: Podiatry

## 2018-01-21 ENCOUNTER — Ambulatory Visit (INDEPENDENT_AMBULATORY_CARE_PROVIDER_SITE_OTHER): Payer: PPO

## 2018-01-21 VITALS — BP 130/77 | HR 78

## 2018-01-21 DIAGNOSIS — M2042 Other hammer toe(s) (acquired), left foot: Secondary | ICD-10-CM

## 2018-01-21 DIAGNOSIS — M2041 Other hammer toe(s) (acquired), right foot: Secondary | ICD-10-CM

## 2018-01-21 DIAGNOSIS — M2010 Hallux valgus (acquired), unspecified foot: Secondary | ICD-10-CM | POA: Diagnosis not present

## 2018-01-21 NOTE — Progress Notes (Signed)
Subjective:   Patient ID: Gevena Holland, female   DOB: 84 y.o.   MRN: [id number]   HPI Patient presents doing well with surgery and very happy with minimal discomfort   ROS      Objective:  Physical Exam  Neurovascular status intact negative Homans sign noted with patient second digit left well coapted with good alignment of the toes and minimal discomfort when the area is palpated     Assessment:  Doing well post arthroplasty digit to left     Plan:  X-ray reviewed with patient and advised on continued compression and sterile dressing reapplied and continued elevation and reappoint again in 2 weeks for stitch removal or earlier if needed  X-ray indicates satisfactory removal of bone

## 2018-02-02 DIAGNOSIS — E890 Postprocedural hypothyroidism: Secondary | ICD-10-CM

## 2018-02-02 DIAGNOSIS — E782 Mixed hyperlipidemia: Secondary | ICD-10-CM | POA: Diagnosis not present

## 2018-02-02 LAB — COMPLETE METABOLIC PANEL WITH GFR
AG RATIO: 1.3 (calc) (ref 1.0–2.5)
ALT: 16 U/L (ref 6–29)
AST: 19 U/L (ref 10–35)
Albumin: 3.9 g/dL (ref 3.6–5.1)
Alkaline phosphatase (APISO): 63 U/L (ref 33–130)
BUN: 17 mg/dL (ref 7–25)
CALCIUM: 9.6 mg/dL (ref 8.6–10.4)
CO2: 26 mmol/L (ref 20–32)
Chloride: 100 mmol/L (ref 98–110)
Creat: 0.75 mg/dL (ref 0.60–0.88)
GFR, EST AFRICAN AMERICAN: 84 mL/min/{1.73_m2} (ref >=60)
GFR, EST NON AFRICAN AMERICAN: 72 mL/min/{1.73_m2} (ref >=60)
Globulin: 2.9 g/dL (calc) (ref 1.9–3.7)
Glucose, Bld: 99 mg/dL (ref 65–99)
POTASSIUM: 4.3 mmol/L (ref 3.5–5.3)
Sodium: 136 mmol/L (ref 135–146)
TOTAL PROTEIN: 6.8 g/dL (ref 6.1–8.1)
Total Bilirubin: 0.5 mg/dL (ref 0.2–1.2)

## 2018-02-02 LAB — LIPID PANEL
CHOLESTEROL: 184 mg/dL (ref <200)
HDL: 55 mg/dL (ref >50)
LDL Cholesterol (Calc): 107 mg/dL (calc) — ABNORMAL HIGH
NON-HDL CHOLESTEROL (CALC): 129 mg/dL (ref <130)
Total CHOL/HDL Ratio: 3.3 (calc) (ref <5.0)
Triglycerides: 120 mg/dL (ref <150)

## 2018-02-02 LAB — TSH: TSH: 0.58 mIU/L (ref 0.40–4.50)

## 2018-02-04 ENCOUNTER — Ambulatory Visit (INDEPENDENT_AMBULATORY_CARE_PROVIDER_SITE_OTHER): Payer: PPO

## 2018-02-04 ENCOUNTER — Ambulatory Visit: Payer: PPO

## 2018-02-04 ENCOUNTER — Encounter: Payer: Self-pay | Admitting: Internal Medicine

## 2018-02-04 ENCOUNTER — Encounter: Payer: Self-pay | Admitting: Podiatry

## 2018-02-04 ENCOUNTER — Ambulatory Visit (INDEPENDENT_AMBULATORY_CARE_PROVIDER_SITE_OTHER): Payer: PPO | Admitting: Podiatry

## 2018-02-04 ENCOUNTER — Non-Acute Institutional Stay: Payer: PPO | Admitting: Internal Medicine

## 2018-02-04 VITALS — BP 128/68 | HR 77 | Temp 98.0°F | Resp 16 | Ht 64.0 in | Wt 161.4 lb

## 2018-02-04 DIAGNOSIS — G8929 Other chronic pain: Secondary | ICD-10-CM | POA: Diagnosis not present

## 2018-02-04 DIAGNOSIS — M2042 Other hammer toe(s) (acquired), left foot: Secondary | ICD-10-CM

## 2018-02-04 DIAGNOSIS — I10 Essential (primary) hypertension: Secondary | ICD-10-CM

## 2018-02-04 DIAGNOSIS — M545 Low back pain: Secondary | ICD-10-CM

## 2018-02-04 DIAGNOSIS — E785 Hyperlipidemia, unspecified: Secondary | ICD-10-CM

## 2018-02-04 DIAGNOSIS — E890 Postprocedural hypothyroidism: Secondary | ICD-10-CM | POA: Diagnosis not present

## 2018-02-04 DIAGNOSIS — M2041 Other hammer toe(s) (acquired), right foot: Secondary | ICD-10-CM | POA: Diagnosis not present

## 2018-02-04 DIAGNOSIS — Z7189 Other specified counseling: Secondary | ICD-10-CM | POA: Diagnosis not present

## 2018-02-04 DIAGNOSIS — R32 Unspecified urinary incontinence: Secondary | ICD-10-CM

## 2018-02-04 NOTE — Progress Notes (Signed)
Subjective:   Patient ID: Frances Holland, female   DOB: 84 y.o.   MRN: [id number]   HPI Patient states doing well with the toe with continued swelling that she is concerned about   ROS      Objective:  Physical Exam  Neurovascular status intact with mild swelling second digit left with wound edges well coapted stitches in place     Assessment:  Hammertoe deformity second left still present with wound edges well coapted     Plan:  Stitches removed wound edges coapted well reviewed final x-ray and allow patient to gradually return to soft shoe gear  X-rays indicate that the digit is in good alignment with no signs of pathology

## 2018-02-04 NOTE — Patient Instructions (Signed)
 Dyslipidemia Dyslipidemia is an imbalance of waxy, fat-like substances (lipids) in the blood. The body needs lipids in small amounts. Dyslipidemia often involves a high level of cholesterol or triglycerides, which are types of lipids. Common forms of dyslipidemia include:  High levels of bad cholesterol (LDL cholesterol). LDL is the type of cholesterol that causes fatty deposits (plaques) to build up in the blood vessels that carry blood away from your heart (arteries).  Low levels of good cholesterol (HDL cholesterol). HDL cholesterol is the type of cholesterol that protects against heart disease. High levels of HDL remove the LDL buildup from arteries.  High levels of triglycerides. Triglycerides are a fatty substance in the blood that is linked to a buildup of plaques in the arteries.  You can develop dyslipidemia because of the genes you are born with (primary dyslipidemia) or changes that occur during your life (secondary dyslipidemia), or as a side effect of certain medical treatments. What are the causes? Primary dyslipidemia is caused by changes (mutations) in genes that are passed down through families (inherited). These mutations cause several types of dyslipidemia. Mutations can result in disorders that make the body produce too much LDL cholesterol or triglycerides, or not enough HDL cholesterol. These disorders may lead to heart disease, arterial disease, or stroke at an early age. Causes of secondary dyslipidemia include certain lifestyle choices and diseases that lead to dyslipidemia, such as:  Eating a diet that is high in animal fat.  Not getting enough activity or exercise (having a sedentary lifestyle).  Having diabetes, kidney disease, liver disease, or thyroid disease.  Drinking large amounts of alcohol.  Using certain types of drugs.  What increases the risk? You may be at greater risk for dyslipidemia if you are an older man or if you are a woman who has gone  through menopause. Other risk factors include:  Having a family history of dyslipidemia.  Taking certain medicines, including birth control pills, steroids, some diuretics, beta-blockers, and some medicines forHIV.  Smoking cigarettes.  Eating a high-fat diet.  Drinking large amounts of alcohol.  Having certain medical conditions such as diabetes, polycystic ovary syndrome (PCOS), pregnancy, kidney disease, liver disease, or hypothyroidism.  Not exercising regularly.  Being overweight or obese with too much belly fat.  What are the signs or symptoms? Dyslipidemia does not usually cause any symptoms. Very high lipid levels can cause fatty bumps under the skin (xanthomas) or a white or gray ring around the black center (pupil) of the eye. Very high triglyceride levels can cause inflammation of the pancreas (pancreatitis). How is this diagnosed? Your health care provider may diagnose dyslipidemia based on a routine blood test (fasting blood test). Because most people do not have symptoms of the condition, this blood testing (lipid profile) is done on adults age 20 and older and is repeated every 5 years. This test checks:  Total cholesterol. This is a measure of the total amount of cholesterol in your blood, including LDL cholesterol, HDL cholesterol, and triglycerides. A healthy number is below 200.  LDL cholesterol. The target number for LDL cholesterol is different for each person, depending on individual risk factors. For most people, a number below 100 is healthy. Ask your health care provider what your LDL cholesterol number should be.  HDL cholesterol. An HDL level of 60 or higher is best because it helps to protect against heart disease. A number below 40 for men or below 50 for women increases the risk for heart disease.  Triglycerides.   A healthy triglyceride number is below 150.  If your lipid profile is abnormal, your health care provider may do other blood tests to get more  information about your condition. How is this treated? Treatment depends on the type of dyslipidemia that you have and your other risk factors for heart disease and stroke. Your health care provider will have a target range for your lipid levels based on this information. For many people, treatment starts with lifestyle changes, such as diet and exercise. Your health care provider may recommend that you:  Get regular exercise.  Make changes to your diet.  Quit smoking if you smoke.  If diet changes and exercise do not help you reach your goals, your health care provider may also prescribe medicine to lower lipids. The most commonly prescribed type of medicine lowers your LDL cholesterol (statin drug). If you have a high triglyceride level, your provider may prescribe another type of drug (fibrate) or an omega-3 fish oil supplement, or both. Follow these instructions at home:  Take over-the-counter and prescription medicines only as told by your health care provider. This includes supplements.  Get regular exercise. Start an aerobic exercise and strength training program as told by your health care provider. Ask your health care provider what activities are safe for you. Your health care provider may recommend: ? 30 minutes of aerobic activity 4-6 days a week. Brisk walking is an example of aerobic activity. ? Strength training 2 days a week.  Eat a healthy diet as told by your health care provider. This can help you reach and maintain a healthy weight, lower your LDL cholesterol, and raise your HDL cholesterol. It may help to work with a diet and nutrition specialist (dietitian) to make a plan that is right for you. Your dietitian or health care provider may recommend: ? Limiting your calories, if you are overweight. ? Eating more fruits, vegetables, whole grains, fish, and lean meats. ? Limiting saturated fat, trans fat, and cholesterol.  Follow instructions from your health care provider  or dietitian about eating or drinking restrictions.  Limit alcohol intake to no more than one drink per day for nonpregnant women and two drinks per day for men. One drink equals 12 oz of beer, 5 oz of wine, or 1 oz of hard liquor.  Do not use any products that contain nicotine or tobacco, such as cigarettes and e-cigarettes. If you need help quitting, ask your health care provider.  Keep all follow-up visits as told by your health care provider. This is important. Contact a health care provider if:  You are having trouble sticking to your exercise or diet plan.  You are struggling to quit smoking or control your use of alcohol. Summary  Dyslipidemia is an imbalance of waxy, fat-like substances (lipids) in the blood. The body needs lipids in small amounts. Dyslipidemia often involves a high level of cholesterol or triglycerides, which are types of lipids.  Treatment depends on the type of dyslipidemia that you have and your other risk factors for heart disease and stroke.  For many people, treatment starts with lifestyle changes, such as diet and exercise. Your health care provider may also prescribe medicine to lower lipids. This information is not intended to replace advice given to you by your health care provider. Make sure you discuss any questions you have with your health care provider. Document Released: 11/30/2013 Document Revised: 07/22/2016 Document Reviewed: 07/22/2016 Elsevier Interactive Patient Education  2018 Elsevier Inc.  

## 2018-02-04 NOTE — Progress Notes (Addendum)
[facility]  Provider: Blanchie Serve MD   Location:  [place] of Service:  Clinic (12)  PCP: Blanchie Serve, MD Patient Care Team: Blanchie Serve, MD as PCP - General (Internal Medicine) Reynold Bowen, MD as Consulting Physician (Endocrinology)  Extended Emergency Contact Information Primary Emergency Contact: Ching,Gordon Address: [address]          [place], [place] [address] [name] of [place] Phone: [phone] Mobile Phone: [phone] Relation: Spouse Secondary Emergency Contact: [name],[name] Address: [address]          [place],  [address] [name] of [place] Phone: [phone] Work Phone: [phone] Mobile Phone: [phone] Relation: Son  CODE STATUS- full code  Goals of Care: Advanced Directive information Advanced Directives 02/04/2018  Does Patient Have a Medical Advance Directive? Yes  Type of Paramedic of [place];Living will  Does patient want to make changes to medical advance directive? Yes (MAU/Ambulatory/Procedural Areas - Information given)  Copy of [place] in Chart? Yes      Chief Complaint  Patient presents with  . Medical Management of Chronic Issues    6 month follow up  . Medication Refill    No refills needed at this time.   Marland Kitchen Results    Discuss labs  . MMSE    29/30. Passed clock drawing    HPI: Patient is a 84 y.o. female seen today for routine visit.   Hammer toe- underwent arthroplasty of digit to left foot. Reviewed note from podiatry office. She is now off the boot to her left foot. Denies any pain this visit.   Memory loss- patient mentions having difficulty recalling names. Per husband, there is repitition Last visit in 9/19, MMSE 28/30, PASSED CLOCK DRAW.   Insomnia- taking melatonin daily at present   Hypothyroidism- currently on levothyroxine 112 mcg daily  Hypertension- currently on  hyzaar 100-12.5 mg daily and amlodipine 2.5 mg daily  OA- with back pain, knee pain and shoulder pain. Taking tylenol as needed. Denies pain this visit.     Past Medical History:  Diagnosis Date  . Back pain   . Cancer [facility])    left breaset mastectomy   . Candidiasis of skin and nails   . Cellulitis and abscess of upper arm and forearm   . Heartburn   . Hemorrhoids 08/20/2016  . History of breast cancer 2000   History left mastectomy  . 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 Hypertension   . Insomnia   . Knee pain, bilateral 2014  . Lymphedema    Left arm following mastectomy  . Memory impairment   . Memory loss   . Migraine 05/16/2012  . Migraine without aura, without mention of intractable migraine without mention of status migrainosus    patient denies at preop of 08/08/16   . Other and unspecified hyperlipidemia   . Other bursitis disorders   . Other infective bursitis, left shoulder   . Pain in joint, ankle and foot   . Pain in joint, hand   . Pain in joint, lower leg   . Pain in joint, shoulder region   . Pain in joint, shoulder region 04/23/2016  . Pain in limb   . Rectal bleeding 08/20/2016  . Senile osteoporosis   . Symptomatic menopausal or female climacteric states   . Syncope and collapse   . Unspecified hypertensive heart disease without heart failure 1995  .  Unspecified hypothyroidism   . Unspecified polyarthropathy or polyarthritis, site unspecified   . Unspecified urinary incontinence   . Unspecified visual loss   . Xerophthalmia    Past Surgical History:  Procedure Laterality Date  . BILATERAL OOPHORECTOMY  1981  . BREAST RECONSTRUCTION Left 01/2000  . BREAST REDUCTION SURGERY Right 01/2000  . CATARACT EXTRACTION Right 09/04/2005  . COLONOSCOPY  01/2004  . MASTECTOMY Left 10/1999  . THYROID LOBECTOMY Left 08/14/2016   Procedure: NEAR TOTAL THYROIDECTOMY;  Surgeon: Johnathan Hausen, MD;  Location: [facility];  Service: General;  Laterality: Left;  .  TOTAL ABDOMINAL HYSTERECTOMY W/ BILATERAL SALPINGOOPHORECTOMY  1981  . TOTAL KNEE ARTHROPLASTY Right 2014    reports that  has never smoked. she has never used smokeless tobacco. She reports that she does not drink alcohol or use drugs. Social History   Socioeconomic History  . Marital status: Married    Spouse name: Not on file  . Number of children: Not on file  . Years of education: Not on file  . Highest education level: Not on file  Social Needs  . Financial resource strain: Not on file  . Food insecurity - worry: Not on file  . Food insecurity - inability: Not on file  . Transportation needs - medical: Not on file  . Transportation needs - non-medical: Not on file  Occupational History  . Occupation: retired Estate agent   Tobacco Use  . Smoking status: Never Smoker  . Smokeless tobacco: Never Used  Substance and Sexual Activity  . Alcohol use: No  . Drug use: No  . Sexual activity: Not on file  Other Topics Concern  . Not on file  Social History Narrative   [facility] since 07/07/2014   Married -[name]   Never smoked    Alcohol none   Caffeine 2 cups of coffee daily   Exercise walks daily, exercise class half hour 2-3 times a week   Living will,  POA   Patient believes that her memory is OK except for recall of names. Her husband is concerned about it. She scored 29/30 on MMSE in Dec 2017. They both have concerns about the future if he precedes her in death, but he has written down how he keeps accounts and has scheduled an appt with an accountant to do their taxes. They are a 2nd marriage and have his and her families although the "children" get along. They have been married 30 years.    Functional Status Survey:    Family History  Problem Relation Age of Onset  . Heart disease Mother   . Asthma Son     Health Maintenance  Topic Date Due  . PNA vac Low Risk Adult (2 of 2 - PCV13) 10/22/2018  . TETANUS/TDAP  10/30/2027  . INFLUENZA  VACCINE  Completed  . DEXA SCAN  Completed    Allergies  Allergen Reactions  . Sulfa Antibiotics   . Sulfamethoxazole Rash    Outpatient Encounter Medications as of 02/04/2018  Medication Sig  . acetaminophen (TYLENOL) 500 MG tablet Take 1,000 mg by mouth at bedtime as needed (back pain).  Marland Kitchen amLODipine (NORVASC) 2.5 MG tablet TAKE 1 TABLET BY MOUTH ONCE DAILY TO LOWER BLOOD PRESSURE  . Calcium Carb-Cholecalciferol (CALCIUM 600-D PO) Take 1 tablet by mouth daily.  . Cholecalciferol 5000 units capsule Take 5,000 Units by mouth daily.  Marland Kitchen levothyroxine (SYNTHROID, LEVOTHROID) 112 MCG tablet Take 112 mcg by mouth daily before breakfast.  .  losartan-hydrochlorothiazide (HYZAAR) 100-12.5 MG tablet TAKE 1 TABLET BY MOUTH ONCE DAILY IN THE MORNING FOR BLOOD PRESSURE  . Melatonin 3 MG TABS Take 6 mg by mouth at bedtime.   . Multiple Vitamins-Minerals (CENTRUM SILVER PO) Take 1 tablet by mouth daily.   . Omega-3 Fatty Acids (OMEGA-3 CF PO) Take 520 mg by mouth daily.   Vladimir Faster Glycol-Propyl Glycol (SYSTANE) 0.4-0.3 % SOLN Apply to eye. Use two drops of Systane as needed for dry eyes  . Simethicone (GAS RELIEF 125 MAX ST PO) Take by mouth. Take 1-2 tablets as needed after meals  . [DISCONTINUED] acetaminophen (TYLENOL) 500 MG tablet Take 1 tablet (500 mg total) by mouth at bedtime. For 1 week, then daily as needed for back pain.  . [DISCONTINUED] pneumococcal 13-valent conjugate vaccine (PREVNAR 13) SUSP injection Inject 0.5 mLs into the muscle tomorrow at 10 am.  . aspirin EC 81 MG tablet Take 81 mg by mouth daily.  . [DISCONTINUED] doxycycline (VIBRA-TABS) 100 MG tablet Take 1 tablet (100 mg total) by mouth 2 (two) times daily.   No facility-administered encounter medications on file as of 02/04/2018.     Review of Systems  Constitutional: Negative for appetite change, chills, fatigue and fever.  HENT: Positive for postnasal drip. Negative for congestion, rhinorrhea, sinus pressure, sinus  pain, sore throat and trouble swallowing.        Occasional need to clear her nose  Eyes: Positive for visual disturbance. Negative for pain, redness and itching.       Has eye drops for dry eyes, they help  Respiratory: Negative for cough, shortness of breath and wheezing.   Cardiovascular: Negative for chest pain, palpitations and leg swelling.  Gastrointestinal: Negative for abdominal pain, constipation, diarrhea, nausea and vomiting.       Moved her bowel this am. Takes prune daily  Genitourinary: Positive for frequency. Negative for difficulty urinating, dysuria, hematuria, pelvic pain and urgency.  Musculoskeletal: Positive for arthralgias and back pain. Negative for gait problem.       Chronic low back pain, noted with prolonged immobilization. No fall reported  Skin: Negative for wound.  Neurological: Negative for light-headedness, numbness and headaches.  Hematological: Bruises/bleeds easily.  Psychiatric/Behavioral: Positive for confusion. Negative for behavioral problems, dysphoric mood, hallucinations and sleep disturbance.       Per husband, has noticed some confusion regarding names being repititive     Vitals:   02/04/18 1129  BP: 128/68  Pulse: 77  Resp: 16  Temp: 98 F (36.7 C)  TempSrc: Oral  SpO2: 94%  Weight: 161 lb 6.4 oz (73.2 kg)  Height: 5' 4"  (1.626 m)   Body mass index is 27.7 kg/m.   Wt Readings from Last 3 Encounters:  02/04/18 161 lb 6.4 oz (73.2 kg)  10/10/17 163 lb (73.9 kg)  08/13/17 163 lb (73.9 kg)   Physical Exam  Constitutional: She is oriented to person, place, and time. She appears well-developed and well-nourished. No distress.  HENT:  Head: Normocephalic and atraumatic.  Mouth/Throat: Oropharynx is clear and moist. No oropharyngeal exudate.  Eyes: Conjunctivae and EOM are normal. Pupils are equal, round, and reactive to light. Right eye exhibits no discharge. Left eye exhibits no discharge.  Neck: Normal range of motion. Neck  supple.  Cardiovascular: Normal rate and regular rhythm.  Pulmonary/Chest: Effort normal and breath sounds normal. No respiratory distress. She has no wheezes. She has no rales.  Abdominal: Soft. Bowel sounds are normal. There is no tenderness. There is  no rebound and no guarding.  Musculoskeletal: Normal range of motion. She exhibits no edema.  Lymphadenopathy:    She has no cervical adenopathy.  Neurological: She is alert and oriented to person, place, and time.  02/04/18 MMSE 29/30, passed clock draw  Skin: Skin is warm and dry. No rash noted. She is not diaphoretic.  Psychiatric: She has a normal mood and affect. Her behavior is normal.    Labs reviewed: Basic Metabolic Panel: Recent Labs    02/19/17 0001 07/23/17 0814 02/02/18 0000  NA 134* 135 136  K 4.3 4.6 4.3  CL 101 101 100  CO2 25 23 26   GLUCOSE 95 90 99  BUN 17 18 17   CREATININE 0.74 0.72 0.75  CALCIUM 9.7 9.7 9.6   Liver Function Tests: Recent Labs    02/19/17 0001 07/23/17 0814 02/02/18 0000  AST 16 15 19   ALT 14 13 16   ALKPHOS 61 65  --   BILITOT 0.4 0.4 0.5  PROT 6.5 6.7 6.8  ALBUMIN 3.9 4.0  --    No results for input(s): LIPASE, AMYLASE in the last 8760 hours. No results for input(s): AMMONIA in the last 8760 hours. CBC: Recent Labs    07/23/17 0814  WBC 6.1  NEUTROABS 3,050  HGB 13.6  HCT 40.0  MCV 88.9  PLT 327   Cardiac Enzymes: No results for input(s): CKTOTAL, CKMB, CKMBINDEX, TROPONINI in the last 8760 hours. BNP: Invalid input(s): POCBNP No results found for: HGBA1C Lab Results  Component Value Date   TSH 0.58 02/02/2018   No results found for: VITAMINB12 No results found for: FOLATE No results found for: IRON, TIBC, FERRITIN  Lipid Panel: Recent Labs    07/23/17 0814 02/02/18 0000  CHOL 190 184  HDL 48* 55  LDLCALC 110*  --   TRIG 159* 120  CHOLHDL 4.0 3.3   No results found for: HGBA1C  Procedures since last visit: Dg Foot 2 Views Left  Result Date:  02/04/2018 Please see detailed radiograph report in office note.  Dg Foot 2 Views Left  Result Date: 01/21/2018 Please see detailed radiograph report in office note.   Assessment/Plan  1. Hyperlipidemia, unspecified hyperlipidemia type Dietary counselling, recheck lipid in 3-4 months. Consider low dose statin if LDL remains >130.  - CMP with eGFR([facility]); Future - Lipid Panel; Future - CBC (no diff); Future  2. Essential hypertension Continue hyzaar and norvasc for now.   3. Postoperative hypothyroidism Improved TSH. Continue levothyroxine.  - CMP with eGFR([facility]); Future - CBC (no diff); Future  4. Urinary incontinence, unspecified type toileting care. No medication desired at present.   5. Chronic midline low back pain without sciatica Continue tylenol as needed, has rarely used them recently.   6. Goals of care discussion We had goals of care discussion this visit. Patient has a living will and HCPOA paperwork in chart. MOST form filled out with pt and her husband present. Spent time from 11:50 am- 12:10 am going over and filling the MOST form. Patient would like CPR in absence of pulse or breathing. If she has a pulse and/or is breathing and medical interventions are needed, she would not want intubation or mechanical ventilation if possible. She is ok with having it if absolutely necessary. Otherwise she would like limited intervention only. She would want antibiotic if indicated. Iv fluids and feeding tube desired for a defined trial period if indicated. Form reviewed and signed by patient, myself and her husband. Copy made for our records  Labs/tests ordered:  As above  Next appointment: 3-4 months for physical with EKG    Blanchie Serve, MD Internal Medicine [place], [place] [address] Cell Phone (Monday-Friday 8 am - 5 pm): [phone] On Call: [phone] and follow prompts after 5 pm and on  weekends Office Phone: [phone] Office Fax: [phone]

## 2018-02-11 ENCOUNTER — Encounter: Payer: Self-pay | Admitting: Internal Medicine

## 2018-03-11 ENCOUNTER — Ambulatory Visit (INDEPENDENT_AMBULATORY_CARE_PROVIDER_SITE_OTHER): Payer: PPO

## 2018-03-11 ENCOUNTER — Encounter: Payer: Self-pay | Admitting: Podiatry

## 2018-03-11 ENCOUNTER — Ambulatory Visit: Payer: PPO | Admitting: Podiatry

## 2018-03-11 DIAGNOSIS — M2042 Other hammer toe(s) (acquired), left foot: Secondary | ICD-10-CM

## 2018-03-11 DIAGNOSIS — M2041 Other hammer toe(s) (acquired), right foot: Secondary | ICD-10-CM

## 2018-03-11 NOTE — Progress Notes (Signed)
dg 

## 2018-03-12 NOTE — Progress Notes (Signed)
Subjective:   Patient ID: Frances Holland, female   DOB: 84 y.o.   MRN: [id number]   HPI Patient presents stating that my second toe my left foot has a corn on it that is irritated   ROS      Objective:  Physical Exam  The patient has a small blood type blister on the second digit left that is local with some thickened tissue but does not appear currently to have any issue from where we did the previous surgery     Assessment:  May be just an irritative point versus a breakdown of the tissue where the patient had chronic callus formation     Plan:  X-ray reviewed and today I went ahead and sterile debridement of this area was accomplished I flushed the area applied a small amount of the septic stick to try to reduce the bleeding sterile dressing instructed on soaks and will reappoint as needed  X-ray indicates satisfactory resection of bone with no other indication of pathology

## 2018-03-16 DIAGNOSIS — M545 Low back pain: Secondary | ICD-10-CM | POA: Diagnosis not present

## 2018-03-16 DIAGNOSIS — Z1389 Encounter for screening for other disorder: Secondary | ICD-10-CM | POA: Diagnosis not present

## 2018-03-16 DIAGNOSIS — M810 Age-related osteoporosis without current pathological fracture: Secondary | ICD-10-CM | POA: Diagnosis not present

## 2018-03-16 DIAGNOSIS — E890 Postprocedural hypothyroidism: Secondary | ICD-10-CM | POA: Diagnosis not present

## 2018-03-16 DIAGNOSIS — C73 Malignant neoplasm of thyroid gland: Secondary | ICD-10-CM | POA: Diagnosis not present

## 2018-03-16 DIAGNOSIS — E7849 Other hyperlipidemia: Secondary | ICD-10-CM | POA: Diagnosis not present

## 2018-03-16 DIAGNOSIS — C50919 Malignant neoplasm of unspecified site of unspecified female breast: Secondary | ICD-10-CM | POA: Diagnosis not present

## 2018-03-16 DIAGNOSIS — Z6827 Body mass index (BMI) 27.0-27.9, adult: Secondary | ICD-10-CM | POA: Diagnosis not present

## 2018-03-18 ENCOUNTER — Telehealth: Payer: Self-pay

## 2018-03-18 NOTE — Telephone Encounter (Signed)
Mr. [name] came down to the clinic today and dropped off some paperwork that he had received in the mail for his wife regarding some pre-screening testing. Mr. [name] wanted to know if this something his wife should do.   Dr. Bubba Camp has looked over the paperwork and the patient has a physical on 06/03/18. At that time is when is the issues will be addressed.  I called Mr. [name] to let him know that Dr. Bubba Camp would address the concerns during his wife's physical appointment. He was happy that we were able to give him a quick response.

## 2018-04-06 ENCOUNTER — Telehealth: Payer: Self-pay

## 2018-04-06 NOTE — Telephone Encounter (Signed)
I dont want her to take aspirin for back pain for obvious reasons. Please have her to be seen by myself or [name] in clinic for back pain issue. Thank you.

## 2018-04-06 NOTE — Telephone Encounter (Signed)
Patient called to say that the tylenol extra strength that she was told to take is not helping her back pain. She stated that she had more relief with the two aspirin she was taking in the past. Patient would like a call to discuss her options to treat her back pain.

## 2018-04-06 NOTE — Telephone Encounter (Signed)
Notified patient and appt scheduled for 04/08/18 at 930.

## 2018-04-08 ENCOUNTER — Non-Acute Institutional Stay: Payer: PPO | Admitting: Internal Medicine

## 2018-04-08 ENCOUNTER — Encounter: Payer: Self-pay | Admitting: Internal Medicine

## 2018-04-08 VITALS — BP 126/70 | HR 77 | Temp 97.6°F | Resp 16 | Ht 64.0 in | Wt 163.2 lb

## 2018-04-08 DIAGNOSIS — G8929 Other chronic pain: Secondary | ICD-10-CM

## 2018-04-08 DIAGNOSIS — M545 Low back pain, unspecified: Secondary | ICD-10-CM

## 2018-04-08 MED ORDER — NAPROXEN 500 MG PO TBEC: 500.0000 mg | DELAYED_RELEASE_TABLET | Freq: Every day | ORAL | 0 refills | Status: DC | PRN

## 2018-04-08 NOTE — Patient Instructions (Signed)
  Stop taking tylenol as it is not helping your pain.   Take baby aspirin enteric coated 81 mg every day to protect your heart but do not take more than that.   I have provided naproxen 500 mg daily at bedtime as needed for your back pain.   My office will call you with results on your back xray once resulted.

## 2018-04-08 NOTE — Progress Notes (Signed)
[facility]  Provider: Blanchie Serve MD   Location:  [place] of Service:  Clinic (12)  PCP: Blanchie Serve, MD Patient Care Team: Blanchie Serve, MD as PCP - General (Internal Medicine) Reynold Bowen, MD as Consulting Physician (Endocrinology)  Extended Emergency Contact Information Primary Emergency Contact: Mckendree,Gordon Address: [address]          [place], [place] [address] [name] of [place] Phone: [phone] Mobile Phone: [phone] Relation: Spouse Secondary Emergency Contact: [name],[name] Address: [address]          [place], [place] [address] [name] of [place] Phone: [phone] Work Phone: [phone] Mobile Phone: [phone] Relation: Son   Goals of Care: Advanced Directive information Advanced Directives 02/04/2018  Does Patient Have a Medical Advance Directive? Yes  Type of Paramedic of [place];Living will  Does patient want to make changes to medical advance directive? Yes (MAU/Ambulatory/Procedural Areas - Information given)  Copy of [place] in Chart? Yes      Chief Complaint  Patient presents with  . Acute Visit    ongoing back pain, tylenol not helping  . Medication Refill    No refills needed at this time    HPI: Patient is a 84 y.o. female seen today for acute visit for back pain. She has history of chronic back pain without sciatica for 2-3 years now and has been worsening/ more prominent in last 1 year. Her low back pain has worsened, denies radiation of the pain. Pain worse with resting and improved. She has been taking tylenol extra strength 500 mg 2 tablets at bedtime without help. The pain is 6-8/10. Pain is sharp in nature to keep her awake and is constant. She has not been able to sleep for 2 nights due to the pain. She mentions that taking 2 aspirin in the past has been helpful, During the day she is fine.  Walking is not affected by the pain. No falls reported.   Past Medical History:  Diagnosis Date  . Back pain   . Cancer [facility])    left breaset mastectomy   . Candidiasis of skin and nails   . Cellulitis and abscess of upper arm and forearm   . Heartburn   . Hemorrhoids 08/20/2016  . History of breast cancer 2000   History left mastectomy  . 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 Hypertension   . Insomnia   . Knee pain, bilateral 2014  . Lymphedema    Left arm following mastectomy  . Memory impairment   . Memory loss   . Migraine 05/16/2012  . Migraine without aura, without mention of intractable migraine without mention of status migrainosus    patient denies at preop of 08/08/16   . Other and unspecified hyperlipidemia   . Other bursitis disorders   . Other infective bursitis, left shoulder   . Pain in joint, ankle and foot   . Pain in joint, hand   . Pain in joint, lower leg   . Pain in joint, shoulder region   . Pain in joint, shoulder region 04/23/2016  . Pain in limb   . Rectal bleeding 08/20/2016  . Senile osteoporosis   . Symptomatic menopausal or female climacteric states   . Syncope and collapse   . Unspecified hypertensive heart disease without heart failure 1995  . Unspecified hypothyroidism   . Unspecified polyarthropathy or polyarthritis, site unspecified   .  Unspecified urinary incontinence   . Unspecified visual loss   . Xerophthalmia    Past Surgical History:  Procedure Laterality Date  . BILATERAL OOPHORECTOMY  1981  . BREAST RECONSTRUCTION Left 01/2000  . BREAST REDUCTION SURGERY Right 01/2000  . CATARACT EXTRACTION Right 09/04/2005  . COLONOSCOPY  01/2004  . MASTECTOMY Left 10/1999  . THYROID LOBECTOMY Left 08/14/2016   Procedure: NEAR TOTAL THYROIDECTOMY;  Surgeon: Johnathan Hausen, MD;  Location: [facility];  Service: General;  Laterality: Left;  . TOTAL ABDOMINAL HYSTERECTOMY W/ BILATERAL SALPINGOOPHORECTOMY  1981  . TOTAL KNEE ARTHROPLASTY Right 2014     reports that she has never smoked. She has never used smokeless tobacco. She reports that she does not drink alcohol or use drugs. Social History   Socioeconomic History  . Marital status: Married    Spouse name: Not on file  . Number of children: Not on file  . Years of education: Not on file  . Highest education level: Not on file  Occupational History  . Occupation: retired Estate agent   Social Needs  . Financial resource strain: Not on file  . Food insecurity:    Worry: Not on file    Inability: Not on file  . Transportation needs:    Medical: Not on file    Non-medical: Not on file  Tobacco Use  . Smoking status: Never Smoker  . Smokeless tobacco: Never Used  Substance and Sexual Activity  . Alcohol use: No  . Drug use: No  . Sexual activity: Not on file  Lifestyle  . Physical activity:    Days per week: Not on file    Minutes per session: Not on file  . Stress: Not on file  Relationships  . Social connections:    Talks on phone: Not on file    Gets together: Not on file    Attends religious service: Not on file    Active member of club or organization: Not on file    Attends meetings of clubs or organizations: Not on file    Relationship status: Not on file  . Intimate partner violence:    Fear of current or ex partner: Not on file    Emotionally abused: Not on file    Physically abused: Not on file    Forced sexual activity: Not on file  Other Topics Concern  . Not on file  Social History Narrative   [facility] since 07/07/2014   Married -[name]   Never smoked    Alcohol none   Caffeine 2 cups of coffee daily   Exercise walks daily, exercise class half hour 2-3 times a week   Living will,  POA   Patient believes that her memory is OK except for recall of names. Her husband is concerned about it. She scored 29/30 on MMSE in Dec 2017. They both have concerns about the future if he precedes her in death, but he has written down how  he keeps accounts and has scheduled an appt with an accountant to do their taxes. They are a 2nd marriage and have his and her families although the "children" get along. They have been married 30 years.     Family History  Problem Relation Age of Onset  . Heart disease Mother   . Asthma Son     Health Maintenance  Topic Date Due  . INFLUENZA VACCINE  07/09/2018  . TETANUS/TDAP  10/30/2027  . DEXA SCAN  Completed  .  PNA vac Low Risk Adult  Completed    Allergies  Allergen Reactions  . Sulfa Antibiotics   . Sulfamethoxazole Rash    Outpatient Encounter Medications as of 04/08/2018  Medication Sig  . amLODipine (NORVASC) 2.5 MG tablet TAKE 1 TABLET BY MOUTH ONCE DAILY TO LOWER BLOOD PRESSURE  . aspirin EC 81 MG tablet Take 81 mg by mouth daily.  . Calcium Carb-Cholecalciferol (CALCIUM 600-D PO) Take 1 tablet by mouth daily.  . Cholecalciferol 5000 units capsule Take 5,000 Units by mouth daily.  Marland Kitchen levothyroxine (SYNTHROID, LEVOTHROID) 112 MCG tablet Take 112 mcg by mouth daily before breakfast.  . losartan-hydrochlorothiazide (HYZAAR) 100-12.5 MG tablet TAKE 1 TABLET BY MOUTH ONCE DAILY IN THE MORNING FOR BLOOD PRESSURE  . Melatonin 3 MG TABS Take 6 mg by mouth at bedtime.   . Multiple Vitamins-Minerals (CENTRUM SILVER PO) Take 1 tablet by mouth daily.   . Omega-3 Fatty Acids (OMEGA-3 CF PO) Take 520 mg by mouth daily.   Vladimir Faster Glycol-Propyl Glycol (SYSTANE) 0.4-0.3 % SOLN Apply to eye. Use two drops of Systane as needed for dry eyes  . Simethicone (GAS RELIEF 125 MAX ST PO) Take by mouth. Take 1-2 tablets as needed after meals  . [DISCONTINUED] acetaminophen (TYLENOL) 500 MG tablet Take 1,000 mg by mouth at bedtime as needed (back pain).  . naproxen (EC NAPROSYN) 500 MG EC tablet Take 1 tablet (500 mg total) by mouth daily as needed. Take it at bedtime as needed for pain   No facility-administered encounter medications on file as of 04/08/2018.     Review of Systems    Constitutional: Negative for chills and fever.  Respiratory: Negative for shortness of breath.   Cardiovascular: Negative for chest pain.  Genitourinary: Negative for dysuria.  Musculoskeletal: Positive for arthralgias, back pain and joint swelling. Negative for gait problem and myalgias.       Occasional knee pain has history of knee replacement  Neurological: Negative for dizziness, weakness, numbness and headaches.    Vitals:   04/08/18 0926  BP: 126/70  Pulse: 77  Resp: 16  Temp: 97.6 F (36.4 C)  TempSrc: Oral  SpO2: 98%  Weight: 163 lb 3.2 oz (74 kg)  Height: 5\' 4"  (1.626 m)   Body mass index is 28.01 kg/m.   Wt Readings from Last 3 Encounters:  04/08/18 163 lb 3.2 oz (74 kg)  02/04/18 161 lb 6.4 oz (73.2 kg)  10/10/17 163 lb (73.9 kg)   Physical Exam  Constitutional: She is oriented to person, place, and time. She appears well-developed and well-nourished. No distress.  HENT:  Head: Normocephalic and atraumatic.  Eyes: Conjunctivae are normal. Right eye exhibits no discharge. Left eye exhibits no discharge.  Neck: Normal range of motion. Neck supple.  Musculoskeletal: Normal range of motion.  Mild scoliosis noted, lumbosacral paraspinal tenderness noted, normal flexion and extension, able to move all 4 extremities, no assistive device used  Lymphadenopathy:    She has no cervical adenopathy.  Neurological: She is alert and oriented to person, place, and time.  Skin: Skin is warm and dry. She is not diaphoretic.  Erythematous spot to left shoulder that blanches, skin intact, no signs of infection, non tender and normal temperature.   Psychiatric: She has a normal mood and affect.    Labs reviewed: Basic Metabolic Panel: Recent Labs    07/23/17 0814 02/02/18 0000  NA 135 136  K 4.6 4.3  CL 101 100  CO2 23 26  GLUCOSE  90 99  BUN 18 17  CREATININE 0.72 0.75  CALCIUM 9.7 9.6   Liver Function Tests: Recent Labs    07/23/17 0814 02/02/18 0000  AST 15  19  ALT 13 16  ALKPHOS 65  --   BILITOT 0.4 0.5  PROT 6.7 6.8  ALBUMIN 4.0  --    No results for input(s): LIPASE, AMYLASE in the last 8760 hours. No results for input(s): AMMONIA in the last 8760 hours. CBC: Recent Labs    07/23/17 0814  WBC 6.1  NEUTROABS 3,050  HGB 13.6  HCT 40.0  MCV 88.9  PLT 327   Cardiac Enzymes: No results for input(s): CKTOTAL, CKMB, CKMBINDEX, TROPONINI in the last 8760 hours. BNP: Invalid input(s): POCBNP No results found for: HGBA1C Lab Results  Component Value Date   TSH 0.58 02/02/2018   No results found for: VITAMINB12 No results found for: FOLATE No results found for: IRON, TIBC, FERRITIN  Lipid Panel: Recent Labs    07/23/17 0814 02/02/18 0000  CHOL 190 184  HDL 48* 55  LDLCALC 110* 107*  TRIG 159* 120  CHOLHDL 4.0 3.3   No results found for: HGBA1C  Procedures since last visit: Dg Foot 2 Views Left  Result Date: 03/11/2018 Please see detailed radiograph report in office note.   Assessment/Plan  1. Chronic bilateral low back pain without sciatica Has ongoing back pain, aspirin 650 mg helps with pain but tylenol does not. No radiculopathy. Obtain xray to assess further, likley OA with DJD. No signs of cord compression. D/c tylenol. Start naproxen at bedtime as needed. Monitor renal function. Advised not to take aspirin for pain for now.  - naproxen (EC NAPROSYN) 500 MG EC tablet; Take 1 tablet (500 mg total) by mouth daily as needed. Take it at bedtime as needed for pain  Dispense: 30 tablet; Refill: 0 - DG Lumbar Spine Complete; Future - DG Sacrum/Coccyx; Future   Has routine labs pending prior to next visit.   Next appointment: 06/03/18 for physical.   Communication: reviewed care plan with patient     Blanchie Serve, MD Internal Medicine [place], [place] [address] Cell Phone (Monday-Friday 8 am - 5 pm): [phone] On Call: [phone] and follow  prompts after 5 pm and on weekends Office Phone: [phone] Office Fax: [phone]

## 2018-04-10 ENCOUNTER — Encounter: Payer: Self-pay | Admitting: Podiatry

## 2018-04-10 ENCOUNTER — Ambulatory Visit: Payer: PPO | Admitting: Podiatry

## 2018-04-10 DIAGNOSIS — M7750 Other enthesopathy of unspecified foot: Secondary | ICD-10-CM | POA: Diagnosis not present

## 2018-04-10 DIAGNOSIS — M2042 Other hammer toe(s) (acquired), left foot: Secondary | ICD-10-CM

## 2018-04-10 DIAGNOSIS — M779 Enthesopathy, unspecified: Secondary | ICD-10-CM

## 2018-04-13 NOTE — Progress Notes (Signed)
Subjective:   Patient ID: Frances Holland, female   DOB: 84 y.o.   MRN: [id number]   HPI Patient presents stating her left second toe is feeling quite a bit better and the surgery is been successful but the right second toe has a small lesion that she was concerned about   ROS      Objective:  Physical Exam  Neurovascular status intact with left second digit healing well with no indications of corn formation and digit in good alignment with the right second toe showing slight keratotic lesion on the medial side but nowhere near to the degree of the left     Assessment:  Digital deformity second digit right with improved second digit left from surgery     Plan:  Reviewed condition do not recommend aggressive treatment and padding would be good at this point but do not think long-term this will require surgery

## 2018-04-21 ENCOUNTER — Encounter: Payer: PPO | Admitting: Family

## 2018-05-25 DIAGNOSIS — E785 Hyperlipidemia, unspecified: Secondary | ICD-10-CM

## 2018-05-25 DIAGNOSIS — E890 Postprocedural hypothyroidism: Secondary | ICD-10-CM

## 2018-05-25 LAB — COMPLETE METABOLIC PANEL WITH GFR
AG Ratio: 1.4 (calc) (ref 1.0–2.5)
ALBUMIN MSPROF: 4 g/dL (ref 3.6–5.1)
ALT: 16 U/L (ref 6–29)
AST: 18 U/L (ref 10–35)
Alkaline phosphatase (APISO): 60 U/L (ref 33–130)
BUN: 15 mg/dL (ref 7–25)
CALCIUM: 9.6 mg/dL (ref 8.6–10.4)
CO2: 26 mmol/L (ref 20–32)
CREATININE: 0.72 mg/dL (ref 0.60–0.88)
Chloride: 99 mmol/L (ref 98–110)
GFR, EST AFRICAN AMERICAN: 87 mL/min/{1.73_m2} (ref >=60)
GFR, EST NON AFRICAN AMERICAN: 75 mL/min/{1.73_m2} (ref >=60)
Globulin: 2.8 g/dL (calc) (ref 1.9–3.7)
Glucose, Bld: 85 mg/dL (ref 65–99)
Potassium: 4.5 mmol/L (ref 3.5–5.3)
Sodium: 134 mmol/L — ABNORMAL LOW (ref 135–146)
TOTAL PROTEIN: 6.8 g/dL (ref 6.1–8.1)
Total Bilirubin: 0.6 mg/dL (ref 0.2–1.2)

## 2018-05-25 LAB — CBC
HCT: 38.6 % (ref 35.0–45.0)
Hemoglobin: 13.6 g/dL (ref 11.7–15.5)
MCH: 30.4 pg (ref 27.0–33.0)
MCHC: 35.2 g/dL (ref 32.0–36.0)
MCV: 86.4 fL (ref 80.0–100.0)
MPV: 10.4 fL (ref 7.5–12.5)
PLATELETS: 334 10*3/uL (ref 140–400)
RBC: 4.47 10*6/uL (ref 3.80–5.10)
RDW: 12.9 % (ref 11.0–15.0)
WBC: 5.9 10*3/uL (ref 3.8–10.8)

## 2018-05-25 LAB — LIPID PANEL
Cholesterol: 196 mg/dL (ref <200)
HDL: 52 mg/dL (ref >50)
LDL Cholesterol (Calc): 116 mg/dL (calc) — ABNORMAL HIGH
Non-HDL Cholesterol (Calc): 144 mg/dL (calc) — ABNORMAL HIGH (ref <130)
Total CHOL/HDL Ratio: 3.8 (calc) (ref <5.0)
Triglycerides: 168 mg/dL — ABNORMAL HIGH (ref <150)

## 2018-06-03 ENCOUNTER — Non-Acute Institutional Stay: Payer: PPO | Admitting: Internal Medicine

## 2018-06-03 ENCOUNTER — Encounter: Payer: Self-pay | Admitting: Internal Medicine

## 2018-06-03 VITALS — BP 122/62 | HR 75 | Temp 97.6°F | Resp 18 | Ht 64.0 in | Wt 159.2 lb

## 2018-06-03 DIAGNOSIS — M545 Low back pain, unspecified: Secondary | ICD-10-CM

## 2018-06-03 DIAGNOSIS — R05 Cough: Secondary | ICD-10-CM

## 2018-06-03 DIAGNOSIS — L84 Corns and callosities: Secondary | ICD-10-CM

## 2018-06-03 DIAGNOSIS — Z0000 Encounter for general adult medical examination without abnormal findings: Secondary | ICD-10-CM | POA: Diagnosis not present

## 2018-06-03 DIAGNOSIS — G8929 Other chronic pain: Secondary | ICD-10-CM | POA: Diagnosis not present

## 2018-06-03 DIAGNOSIS — R058 Other specified cough: Secondary | ICD-10-CM

## 2018-06-03 DIAGNOSIS — Z853 Personal history of malignant neoplasm of breast: Secondary | ICD-10-CM

## 2018-06-03 DIAGNOSIS — I10 Essential (primary) hypertension: Secondary | ICD-10-CM

## 2018-06-03 DIAGNOSIS — E782 Mixed hyperlipidemia: Secondary | ICD-10-CM

## 2018-06-03 DIAGNOSIS — R4189 Other symptoms and signs involving cognitive functions and awareness: Secondary | ICD-10-CM

## 2018-06-03 DIAGNOSIS — E890 Postprocedural hypothyroidism: Secondary | ICD-10-CM | POA: Diagnosis not present

## 2018-06-03 DIAGNOSIS — G4700 Insomnia, unspecified: Secondary | ICD-10-CM

## 2018-06-03 MED ORDER — DONEPEZIL HCL 5 MG PO TABS: 5.0000 mg | ORAL_TABLET | Freq: Every day | ORAL | 0 refills | Status: DC

## 2018-06-03 MED ORDER — LORATADINE 10 MG PO TABS: 10.0000 mg | ORAL_TABLET | Freq: Every day | ORAL | 11 refills | Status: DC

## 2018-06-03 MED ORDER — NAPROXEN SODIUM 220 MG PO TABS: ORAL_TABLET | ORAL | 0 refills | Status: DC

## 2018-06-03 NOTE — Progress Notes (Signed)
[facility]  Provider: Blanchie Serve MD   Location:  [place] [facility] Service:  Clinic (12)  PCP: Blanchie Serve, MD Patient Care Team: Blanchie Serve, MD as PCP - General (Internal Medicine) Reynold Bowen, MD as Consulting Physician (Endocrinology)  Extended Emergency Contact Information Primary Emergency Contact: Lwin,Gordon Address: [address], [address]          [place], [place] [address] [name] of [place] Phone: [phone] Mobile Phone: [phone] Relation: Spouse Secondary Emergency Contact: [name],[name] Address: [address]          [place], [place] [address] [name] of [place] Phone: [phone] Work Phone: [phone] Mobile Phone: [phone] Relation: Son  Code Status: DNR  Goals of Care: Advanced Directive information Advanced Directives 06/03/2018  Does Patient Have a Medical Advance Directive? Yes  Type of Paramedic of [place];Living will;Out of facility DNR (pink MOST or yellow form)  Does patient want to make changes to medical advance directive? No - Patient declined  Copy of [place] in Chart? Yes  Pre-existing out of facility DNR order (yellow form or pink MOST form) Pink MOST form placed in chart (order not valid for inpatient use)      Chief Complaint  Patient presents with  . Annual Exam    annual exam  . Medication Refill    Naproxen pending   . Results    Discuss labs    HPI: Patient is a 84 y.o. female seen today for annual visit. Her husband is here with her. She has been forgetful with recent memories per husband and tends to repeatedly ask same questions. She also has developed a dry cough that can occur at anytime of the day, not related to meals or lying down for few weeks. Has tried OTC losenges without much help.   HTN- takes amlodipine and losartan-hctz daily. Also on baby aspirin  Dry eyes- takes systane eye drops  as needed, helps her, wears glasses, due for eye exam for over a year  Hyperlipidemia- takes omega 3 fatty acid. Tolerating well  Low back pain- mainly with her lying down, relieved with standing and movement. Taking naproxen 220 mg daily at bedtime with some relief.   Insomnia- takes 2 3 mg melatonin, helping her sleep  Hypothyroidism- taking levothyroxine 100 mcg daily, followed by Dr Forde Dandy. Has history of follicular neoplasm of thyroid.   Past Medical History:  Diagnosis Date  . Back pain   . Cancer [facility])    left breaset mastectomy   . Candidiasis of skin and nails   . Cellulitis and abscess of upper arm and forearm   . Heartburn   . Hemorrhoids 08/20/2016  . History of breast cancer 2000   History left mastectomy  . 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 Hypertension   . Insomnia   . Knee pain, bilateral 2014  . Lymphedema    Left arm following mastectomy  . Memory impairment   . Memory loss   . Migraine 05/16/2012  . Migraine without aura, without mention of intractable migraine without mention of status migrainosus    patient denies at preop of 08/08/16   . Other and unspecified hyperlipidemia   . Other bursitis disorders   . Other infective bursitis, left shoulder   . Pain in joint, ankle and foot   . Pain in joint, hand   . Pain in joint, lower leg   . Pain  in joint, shoulder region   . Pain in joint, shoulder region 04/23/2016  . Pain in limb   . Rectal bleeding 08/20/2016  . Senile osteoporosis   . Symptomatic menopausal or female climacteric states   . Syncope and collapse   . Unspecified hypertensive heart disease without heart failure 1995  . Unspecified hypothyroidism   . Unspecified polyarthropathy or polyarthritis, site unspecified   . Unspecified urinary incontinence   . Unspecified visual loss   . Xerophthalmia    Past Surgical History:  Procedure Laterality Date  . BILATERAL OOPHORECTOMY  1981  . BREAST RECONSTRUCTION Left 01/2000  . BREAST  REDUCTION SURGERY Right 01/2000  . CATARACT EXTRACTION Right 09/04/2005  . COLONOSCOPY  01/2004  . MASTECTOMY Left 10/1999  . THYROID LOBECTOMY Left 08/14/2016   Procedure: NEAR TOTAL THYROIDECTOMY;  Surgeon: Johnathan Hausen, MD;  Location: [facility]S;  Service: General;  Laterality: Left;  . TOTAL ABDOMINAL HYSTERECTOMY W/ BILATERAL SALPINGOOPHORECTOMY  1981  . TOTAL KNEE ARTHROPLASTY Right 2014    reports that she has never smoked. She has never used smokeless tobacco. She reports that she does not drink alcohol or use drugs. Social History   Socioeconomic History  . Marital status: Married    Spouse name: Not on file  . Number of children: Not on file  . Years of education: Not on file  . Highest education level: Not on file  Occupational History  . Occupation: retired Estate agent   Social Needs  . Financial resource strain: Not on file  . Food insecurity:    Worry: Not on file    Inability: Not on file  . Transportation needs:    Medical: Not on file    Non-medical: Not on file  Tobacco Use  . Smoking status: Never Smoker  . Smokeless tobacco: Never Used  Substance and Sexual Activity  . Alcohol use: No  . Drug use: No  . Sexual activity: Not on file  Lifestyle  . Physical activity:    Days per week: Not on file    Minutes per session: Not on file  . Stress: Not on file  Relationships  . Social connections:    Talks on phone: Not on file    Gets together: Not on file    Attends religious service: Not on file    Active member of club or organization: Not on file    Attends meetings of clubs or organizations: Not on file    Relationship status: Not on file  . Intimate partner violence:    Fear of current or ex partner: Not on file    Emotionally abused: Not on file    Physically abused: Not on file    Forced sexual activity: Not on file  Other Topics Concern  . Not on file  Social History Narrative   Lives at [facility] since 07/07/2014   Married -[name]     Never smoked    Alcohol none   Caffeine 2 cups of coffee daily   Exercise walks daily, exercise class half hour 2-3 times a week   Living will,  POA   Patient believes that her memory is OK except for recall of names. Her husband is concerned about it. She scored 29/30 on MMSE in Dec 2017. They both have concerns about the future if he precedes her in death, but he has written down how he keeps accounts and has scheduled an appt with an accountant to do their taxes. They are  a 2nd marriage and have his and her families although the "children" get along. They have been married 30 years.    Functional Status Survey:    Family History  Problem Relation Age of Onset  . Heart disease Mother   . Asthma Son     Health Maintenance  Topic Date Due  . INFLUENZA VACCINE  07/09/2018  . TETANUS/TDAP  10/30/2027  . DEXA SCAN  Completed  . PNA vac Low Risk Adult  Completed    Allergies  Allergen Reactions  . Sulfa Antibiotics   . Sulfamethoxazole Rash    Outpatient Encounter Medications as of 06/03/2018  Medication Sig  . amLODipine (NORVASC) 2.5 MG tablet TAKE 1 TABLET BY MOUTH ONCE DAILY TO LOWER BLOOD PRESSURE  . aspirin EC 81 MG tablet Take 81 mg by mouth daily.  . Calcium Carb-Cholecalciferol (CALCIUM 600-D PO) Take 1 tablet by mouth daily.  . Cholecalciferol 5000 units capsule Take 5,000 Units by mouth daily.  Marland Kitchen levothyroxine (SYNTHROID, LEVOTHROID) 100 MCG tablet Take 100 mcg by mouth daily before breakfast.  . losartan-hydrochlorothiazide (HYZAAR) 100-12.5 MG tablet TAKE 1 TABLET BY MOUTH ONCE DAILY IN THE MORNING FOR BLOOD PRESSURE  . Melatonin 3 MG TABS Take 6 mg by mouth at bedtime.   . Multiple Vitamins-Minerals (CENTRUM SILVER PO) Take 1 tablet by mouth daily.   . naproxen (EC NAPROSYN) 500 MG EC tablet Take 1 tablet (500 mg total) by mouth daily as needed. Take it at bedtime as needed for pain  . Omega-3 Fatty Acids (OMEGA-3 CF PO) Take 520 mg by mouth daily.   Vladimir Faster  Glycol-Propyl Glycol (SYSTANE) 0.4-0.3 % SOLN Apply to eye. Use two drops of Systane as needed for dry eyes  . Simethicone (GAS RELIEF 125 MAX ST PO) Take by mouth. Take 1-2 tablets as needed after meals  . [DISCONTINUED] levothyroxine (SYNTHROID, LEVOTHROID) 112 MCG tablet Take 100 mcg by mouth daily before breakfast.    No facility-administered encounter medications on file as of 06/03/2018.     Review of Systems  Constitutional: Negative for appetite change, chills, fatigue and fever.  HENT: Negative for congestion, ear pain, mouth sores, postnasal drip, rhinorrhea, sinus pressure, sinus pain, sore throat and trouble swallowing.   Eyes: Positive for visual disturbance.  Respiratory: Positive for cough. Negative for shortness of breath and wheezing.   Cardiovascular: Negative for chest pain, palpitations and leg swelling.  Gastrointestinal: Negative for abdominal pain, blood in stool, constipation, diarrhea, nausea and vomiting.  Genitourinary: Negative for dysuria, frequency and hematuria.  Musculoskeletal: Positive for back pain. Negative for arthralgias, gait problem, joint swelling and myalgias.       No fall reported, no assistive device used  Skin: Negative for rash and wound.  Neurological: Negative for dizziness, weakness, numbness and headaches.  Psychiatric/Behavioral: Positive for confusion. Negative for behavioral problems and dysphoric mood.    Vitals:   06/03/18 0950  BP: 122/62  Pulse: 75  Resp: 18  Temp: 97.6 F (36.4 C)  TempSrc: Oral  SpO2: 97%  Weight: 159 lb 3.2 oz (72.2 kg)  Height: 5\' 4"  (1.626 m)   Body mass index is 27.33 kg/m.   Wt Readings from Last 3 Encounters:  06/03/18 159 lb 3.2 oz (72.2 kg)  04/08/18 163 lb 3.2 oz (74 kg)  02/04/18 161 lb 6.4 oz (73.2 kg)   Physical Exam  Constitutional: She is oriented to person, place, and time. She appears well-developed and well-nourished. No distress.  HENT:  Head: Normocephalic  and atraumatic.    Right Ear: External ear normal.  Left Ear: External ear normal.  Mouth/Throat: No oropharyngeal exudate.  Has oropharyngeal erythema. Nasal mucosa is swollen ad red, no drainage  Eyes: Pupils are equal, round, and reactive to light. Conjunctivae and EOM are normal. Right eye exhibits no discharge. Left eye exhibits no discharge. No scleral icterus.  Neck: Normal range of motion. Neck supple.  Cardiovascular: Normal rate, regular rhythm and intact distal pulses.  Murmur heard. Pulmonary/Chest: Effort normal and breath sounds normal. No respiratory distress. She has no wheezes. She has no rales. She exhibits no tenderness.  Partial left mastectomy and left arm lymphedema  Abdominal: Soft. Bowel sounds are normal. There is no tenderness. There is no guarding.  Musculoskeletal: Normal range of motion. She exhibits no edema or tenderness.  No spinal tenderness  Lymphadenopathy:    She has no cervical adenopathy.  Neurological: She is alert and oriented to person, place, and time. She exhibits normal muscle tone. Coordination normal.  Skin: Skin is warm and dry. Capillary refill takes less than 2 seconds. She is not diaphoretic.  Left 2nd toe medial area has callus, non tender, no signs of infection  Psychiatric: She has a normal mood and affect. Her behavior is normal.    Labs reviewed: Basic Metabolic Panel: Recent Labs    07/23/17 0814 02/02/18 0000 05/25/18 0805  NA 135 136 134*  K 4.6 4.3 4.5  CL 101 100 99  CO2 23 26 26   GLUCOSE 90 99 85  BUN 18 17 15   CREATININE 0.72 0.75 0.72  CALCIUM 9.7 9.6 9.6   Liver Function Tests: Recent Labs    07/23/17 0814 02/02/18 0000 05/25/18 0805  AST 15 19 18   ALT 13 16 16   ALKPHOS 65  --   --   BILITOT 0.4 0.5 0.6  PROT 6.7 6.8 6.8  ALBUMIN 4.0  --   --    No results for input(s): LIPASE, AMYLASE in the last 8760 hours. No results for input(s): AMMONIA in the last 8760 hours. CBC: Recent Labs    07/23/17 0814 05/25/18 0805   WBC 6.1 5.9  NEUTROABS 3,050  --   HGB 13.6 13.6  HCT 40.0 38.6  MCV 88.9 86.4  PLT 327 334   Cardiac Enzymes: No results for input(s): CKTOTAL, CKMB, CKMBINDEX, TROPONINI in the last 8760 hours. BNP: Invalid input(s): POCBNP No results found for: HGBA1C Lab Results  Component Value Date   TSH 0.58 02/02/2018   No results found for: VITAMINB12 No results found for: FOLATE No results found for: IRON, TIBC, FERRITIN  Lipid Panel: Recent Labs    07/23/17 0814 02/02/18 0000 05/25/18 0805  CHOL 190 184 196  HDL 48* 55 52  LDLCALC 110* 107* 116*  TRIG 159* 120 168*  CHOLHDL 4.0 3.3 3.8   No results found for: HGBA1C  Procedures since last visit: No results found.   MMSE - Mini Mental State Exam 06/03/2018 02/04/2018 08/13/2017  Not completed: (No Data) (No Data) -  Orientation to time 5 5 5   Orientation to Place 4 5 3   Registration 3 3 3   Attention/ Calculation 5 5 5   Recall 2 3 3   Language- name 2 objects 2 2 2   Language- repeat 1 1 1   Language- follow 3 step command 3 2 3   Language- read & follow direction 1 1 1   Write a sentence 1 1 1   Copy design 1 1 1   Total score 28 29 28  Nursing Home from 10/10/2017 in [place]  PHQ-2 Total Score  0     Immunization History  Administered Date(s) Administered  . Influenza, High Dose Seasonal PF 09/17/2017  . Influenza-Unspecified 08/10/2013, 09/07/2015, 09/13/2016  . Pneumococcal Conjugate-13 06/08/2017  . Pneumococcal Polysaccharide-23 12/09/2002, 10/22/2017  . Tdap 02/21/2014, 10/29/2017  . Zoster Recombinat (Shingrix) 07/18/2017, 01/10/2018    Assessment/Plan  1. Cognitive impairment MMSE 28/30. Has hypothyroidism, hypertension and hyperlipidemia, likely has a vascular component. Will start her on donepezil 5 mg daily and monitor - donepezil (ARICEPT) 5 MG tablet; Take 1 tablet (5 mg total) by mouth at bedtime.  Dispense: 90 tablet; Refill: 0  2. Chronic midline low back pain without  sciatica Back precautions, family is looking into changing the mattress. Naproxen 220 mg 1-2 tab qhs prn for pain.  - naproxen sodium (ALEVE) 220 MG tablet; Take 1 to 2 tablet at bedtime as needed for back pain  Dispense: 60 tablet; Refill: 0  3. Annual physical exam uptodate with immunization. Due for shingrix vaccine but waiting due to backlog on supply. She will schedule her mammogram. Reviewed lab results and provided copy for records. the patient was counseled regarding routine eye exam, prevention of dental and periodontal disease, diet, regular sustained exercise for at least 30 minutes 5 times per week, the proper use of sunscreen and protective clothing, cholesterol, thyroid and diabetes screening.  4. Essential hypertension Stable. Continue amlodipine and losartan-HCTZ. Reviewed renal function.   5. Postoperative hypothyroidism Reviewed TSH. Continue levothyroxine, has f/u with Dr Forde Dandy  6. Mixed hyperlipidemia Continue omega 3 supplement. Exercise and diet counselling. Defer statin given her age >70, CHD 5 year risk 10-20% and her mild cognitive impairment that can worsen with statin  7. Insomnia, unspecified type Continue melatonin  8. History of breast cancer S/p left partial mastectomy and right reduction surgery. Due for routine mammogram.   9. Allergic cough Loratadine 10 mg daily for now and reassess.   10. Callus of foot To follow with podiatry    Labs/tests ordered:  none  Next appointment: 1 month to follow on aricept  Communication: reviewed care plan with patient and her husband    Blanchie Serve, MD Internal Medicine [address] [place], [place] [address] Cell Phone (Monday-Friday 8 am - 5 pm): [phone] On Call: [phone] and follow prompts after 5 pm and on weekends Office Phone: [phone] Office Fax: [phone]

## 2018-06-03 NOTE — Patient Instructions (Addendum)
  Take claritin once a day for your allergies with cough. Start taking donepezil 5 mg daily for your mild memory loss. Take naproxen 220 mg 1-2 tablet at night time as needed for back pain.

## 2018-06-05 ENCOUNTER — Other Ambulatory Visit: Payer: Self-pay | Admitting: Internal Medicine

## 2018-06-05 DIAGNOSIS — Z1231 Encounter for screening mammogram for malignant neoplasm of breast: Secondary | ICD-10-CM

## 2018-06-08 ENCOUNTER — Encounter: Payer: PPO | Admitting: Internal Medicine

## 2018-06-09 NOTE — Progress Notes (Signed)
Opened in error

## 2018-06-09 NOTE — Patient Instructions (Signed)
Opened in error

## 2018-06-10 ENCOUNTER — Telehealth: Payer: Self-pay | Admitting: *Deleted

## 2018-06-10 NOTE — Telephone Encounter (Addendum)
Husband called and said patient has been taking claritin since last week and started donepezil on 06/08/18. He said that when she took it last night, her throat started to feel sore and her lips "felt" swollen, but they weren't. No problems breathing, no rash, and no dizziness. He thinks she should stop both meds to get back to base line. She is also complaining of bilateral knee "burning" since starting the new meds. Please call back at [phone]

## 2018-06-10 NOTE — Telephone Encounter (Signed)
Spoke with patient's husband Mr Panchal. No swelling to lips or rash noted, pt had a feeling of tingling/ burning sensation to her knees and lips. She is currently on 2 new medication claritin and donepezil. She would like to stop both medication for few days and start it one at a time and see the effect. Advised to stop both medication for now and in next 2-3 days, resume donepezil 5 mg daily. To notify if develops any side effect. Husband agrees with care plan.

## 2018-06-15 ENCOUNTER — Other Ambulatory Visit: Payer: Self-pay | Admitting: Internal Medicine

## 2018-06-15 DIAGNOSIS — H5213 Myopia, bilateral: Secondary | ICD-10-CM | POA: Diagnosis not present

## 2018-06-15 DIAGNOSIS — H35033 Hypertensive retinopathy, bilateral: Secondary | ICD-10-CM | POA: Diagnosis not present

## 2018-06-18 ENCOUNTER — Ambulatory Visit: Payer: PPO | Admitting: Podiatry

## 2018-06-18 ENCOUNTER — Encounter: Payer: Self-pay | Admitting: Podiatry

## 2018-06-18 DIAGNOSIS — M2041 Other hammer toe(s) (acquired), right foot: Secondary | ICD-10-CM | POA: Diagnosis not present

## 2018-06-18 DIAGNOSIS — M2042 Other hammer toe(s) (acquired), left foot: Secondary | ICD-10-CM | POA: Diagnosis not present

## 2018-06-22 NOTE — Progress Notes (Signed)
Subjective:   Patient ID: Gevena Mart, female   DOB: 84 y.o.   MRN: [id number]   HPI Patient was concerned because she thinks the corners coming back on the second toe of her left foot which is the foot that we did surgery on   ROS      Objective:  Physical Exam  Neurovascular status was found to be intact with muscle strength adequate and second digit left showing some irritation but no indications of significant change in the tissue formation.  It is localized to this area and does not appear to have the same preoperative thickness that we had experienced     Assessment:  Digital deformity which appears to be more inflammation and does not appear to be reoccurrence of callus formation with mild bunion deformity as complicating factor     Plan:  Reviewed x-ray and discuss the pressure between the big toe and second toe of the left foot and that this is probably a big part of the problem.  I do not recommend aggressive treatment unless symptoms were to intensify for this patient

## 2018-06-24 ENCOUNTER — Other Ambulatory Visit: Payer: Self-pay | Admitting: Internal Medicine

## 2018-07-01 ENCOUNTER — Non-Acute Institutional Stay: Payer: PPO | Admitting: Internal Medicine

## 2018-07-01 ENCOUNTER — Encounter: Payer: Self-pay | Admitting: Internal Medicine

## 2018-07-01 VITALS — BP 124/60 | HR 74 | Temp 97.6°F | Resp 18 | Ht 64.0 in | Wt 160.6 lb

## 2018-07-01 DIAGNOSIS — G3184 Mild cognitive impairment, so stated: Secondary | ICD-10-CM

## 2018-07-01 DIAGNOSIS — Z789 Other specified health status: Secondary | ICD-10-CM

## 2018-07-01 NOTE — Progress Notes (Signed)
[facility]  Provider: Blanchie Serve MD   Location:  [place] of Service:  Clinic (12)  PCP: Blanchie Serve, MD Patient Care Team: Blanchie Serve, MD as PCP - General (Internal Medicine) Reynold Bowen, MD as Consulting Physician (Endocrinology)  Extended Emergency Contact Information Primary Emergency Contact: Massaro,Gordon Address: [address]          [place], [place] [address] [name] of [place] Phone: [phone] Mobile Phone: [phone] Relation: Spouse Secondary Emergency Contact: [name],[name] Address: [address]          [place], [place] [address] [name] of [place] Phone: [phone] Work Phone: [phone] Mobile Phone: [phone] Relation: Son   Goals of Care: Advanced Directive information Advanced Directives 07/01/2018  Does Patient Have a Medical Advance Directive? Yes  Type of Paramedic of [place];Living will;Out of facility DNR (pink MOST or yellow form)  Does patient want to make changes to medical advance directive? No - Patient declined  Copy of [place] in Chart? Yes  Pre-existing out of facility DNR order (yellow form or pink MOST form) Pink MOST form placed in chart (order not valid for inpatient use)      Chief Complaint  Patient presents with  . Acute Visit    1 month follow up for memory. Patient's husband stated that she had only taken it 5 nights since its been prescribed. Patiient mentioned that when she takes her aricept she feels extreme body aches rapidly     HPI: Patient is a 84 y.o. female seen today for acute visit. She had been prescribe donepezil at low dose a month back. She took it for 5 nights and stopped taking it. She mentions experiencing severe body ache and stiffness on the 4th and 5th day of taking the medicine. No further experience of similar symptom.   Past Medical History:  Diagnosis Date    . Back pain   . Cancer [facility])    left breaset mastectomy   . Candidiasis of skin and nails   . Cellulitis and abscess of upper arm and forearm   . Heartburn   . Hemorrhoids 08/20/2016  . History of breast cancer 2000   History left mastectomy  . 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 Hypertension   . Insomnia   . Knee pain, bilateral 2014  . Lymphedema    Left arm following mastectomy  . Memory impairment   . Memory loss   . Migraine 05/16/2012  . Migraine without aura, without mention of intractable migraine without mention of status migrainosus    patient denies at preop of 08/08/16   . Other and unspecified hyperlipidemia   . Other bursitis disorders   . Other infective bursitis, left shoulder   . Pain in joint, ankle and foot   . Pain in joint, hand   . Pain in joint, lower leg   . Pain in joint, shoulder region   . Pain in joint, shoulder region 04/23/2016  . Pain in limb   . Rectal bleeding 08/20/2016  . Senile osteoporosis   . Symptomatic menopausal or female climacteric states   . Syncope and collapse   . Unspecified hypertensive heart disease without heart failure 1995  . Unspecified hypothyroidism   . Unspecified polyarthropathy or polyarthritis, site unspecified   . Unspecified urinary incontinence   . Unspecified visual loss   . Xerophthalmia    Past Surgical History:  Procedure Laterality Date  . BILATERAL OOPHORECTOMY  1981  . BREAST RECONSTRUCTION Left 01/2000  . BREAST REDUCTION SURGERY Right 01/2000  . CATARACT EXTRACTION Right 09/04/2005  . COLONOSCOPY  01/2004  . MASTECTOMY Left 10/1999  . THYROID LOBECTOMY Left 08/14/2016   Procedure: NEAR TOTAL THYROIDECTOMY;  Surgeon: Johnathan Hausen, MD;  Location: [facility];  Service: General;  Laterality: Left;  . TOTAL ABDOMINAL HYSTERECTOMY W/ BILATERAL SALPINGOOPHORECTOMY  1981  . TOTAL KNEE ARTHROPLASTY Right 2014    reports that she has never smoked. She has never used smokeless tobacco. She reports that she  does not drink alcohol or use drugs. Social History   Socioeconomic History  . Marital status: Married    Spouse name: Not on file  . Number of children: Not on file  . Years of education: Not on file  . Highest education level: Not on file  Occupational History  . Occupation: retired Estate agent   Social Needs  . Financial resource strain: Not on file  . Food insecurity:    Worry: Not on file    Inability: Not on file  . Transportation needs:    Medical: Not on file    Non-medical: Not on file  Tobacco Use  . Smoking status: Never Smoker  . Smokeless tobacco: Never Used  Substance and Sexual Activity  . Alcohol use: No  . Drug use: No  . Sexual activity: Not on file  Lifestyle  . Physical activity:    Days per week: Not on file    Minutes per session: Not on file  . Stress: Not on file  Relationships  . Social connections:    Talks on phone: Not on file    Gets together: Not on file    Attends religious service: Not on file    Active member of club or organization: Not on file    Attends meetings of clubs or organizations: Not on file    Relationship status: Not on file  . Intimate partner violence:    Fear of current or ex partner: Not on file    Emotionally abused: Not on file    Physically abused: Not on file    Forced sexual activity: Not on file  Other Topics Concern  . Not on file  Social History Narrative   [facility] since 07/07/[facility]   Married -[name]   Never smoked    Alcohol none   Caffeine 2 cups of coffee daily   Exercise walks daily, exercise class half hour 2-3 times a week   Living will,  POA   Patient believes that her memory is OK except for recall of names. Her husband is concerned about it. She scored 29/30 on MMSE in Dec 2017. They both have concerns about the future if he precedes her in death, but he has written down how he keeps accounts and has scheduled an appt with an accountant to do their taxes. They are a 2nd  marriage and have his and her families although the "children" get along. They have been married 30 years.     Family History  Problem Relation Age of Onset  . Heart disease Mother   . Asthma Son     Health Maintenance  Topic Date Due  . INFLUENZA VACCINE  07/09/2018  . TETANUS/TDAP  10/30/2027  . DEXA SCAN  Completed  . PNA vac Low Risk Adult  Completed    Allergies  Allergen Reactions  . Sulfa Antibiotics   .  Sulfamethoxazole Rash    Outpatient Encounter Medications as of 07/01/2018  Medication Sig  . amLODipine (NORVASC) 2.5 MG tablet TAKE 1 TABLET BY MOUTH ONCE DAILY TO LOWER BLOOD PRESSURE  . aspirin EC 81 MG tablet Take 81 mg by mouth daily.  . Calcium Carb-Cholecalciferol (CALCIUM 600-D PO) Take 1 tablet by mouth daily.  . Cholecalciferol 5000 units capsule Take 5,000 Units by mouth daily.  Marland Kitchen levothyroxine (SYNTHROID, LEVOTHROID) 100 MCG tablet Take 100 mcg by mouth daily before breakfast.  . losartan-hydrochlorothiazide (HYZAAR) 100-12.5 MG tablet TAKE 1 TABLET BY MOUTH ONCE DAILY IN THE MORNING FOR BLOOD PRESSURE  . Melatonin 3 MG TABS Take 6 mg by mouth at bedtime.   . Multiple Vitamins-Minerals (CENTRUM SILVER PO) Take 1 tablet by mouth daily.   . naproxen sodium (ALEVE) 220 MG tablet Take 1 to 2 tablet at bedtime as needed for back pain  . Omega-3 Fatty Acids (OMEGA-3 CF PO) Take 520 mg by mouth daily.   Vladimir Faster Glycol-Propyl Glycol (SYSTANE) 0.4-0.3 % SOLN Apply to eye. Use two drops of Systane as needed for dry eyes  . Simethicone (GAS RELIEF 125 MAX ST PO) Take by mouth. Take 1-2 tablets as needed after meals  . donepezil (ARICEPT) 5 MG tablet Take 1 tablet (5 mg total) by mouth at bedtime. (Patient not taking: Reported on 07/01/2018)  . loratadine (CLARITIN) 10 MG tablet Take 1 tablet (10 mg total) by mouth daily. (Patient not taking: Reported on 07/01/2018)   No facility-administered encounter medications on file as of 07/01/2018.     Review of Systems    Constitutional: Negative for chills and fever.  Neurological: Negative for dizziness, weakness and numbness.  Psychiatric/Behavioral: Positive for confusion. Negative for behavioral problems and sleep disturbance.    Vitals:   07/01/18 1109  BP: 124/60  Pulse: 74  Resp: 18  Temp: 97.6 F (36.4 C)  TempSrc: Oral  SpO2: 95%  Weight: 160 lb 9.6 oz (72.8 kg)  Height: 5\' 4"  (1.626 m)   Body mass index is 27.57 kg/m. Physical Exam  Constitutional: She is oriented to person, place, and time. She appears well-developed and well-nourished. No distress.  HENT:  Head: Normocephalic and atraumatic.  Eyes: Conjunctivae are normal.  Neck: Neck supple.  Musculoskeletal: Normal range of motion.  Neurological: She is alert and oriented to person, place, and time.  Skin: She is not diaphoretic.  Psychiatric: She has a normal mood and affect.    Labs reviewed: Basic Metabolic Panel: Recent Labs    07/23/17 0814 02/02/18 0000 05/25/18 0805  NA 135 136 134*  K 4.6 4.3 4.5  CL 101 100 99  CO2 23 26 26   GLUCOSE 90 99 85  BUN 18 17 15   CREATININE 0.72 0.75 0.72  CALCIUM 9.7 9.6 9.6   Liver Function Tests: Recent Labs    07/23/17 0814 02/02/18 0000 05/25/18 0805  AST 15 19 18   ALT 13 16 16   ALKPHOS 65  --   --   BILITOT 0.4 0.5 0.6  PROT 6.7 6.8 6.8  ALBUMIN 4.0  --   --    No results for input(s): LIPASE, AMYLASE in the last 8760 hours. No results for input(s): AMMONIA in the last 8760 hours. CBC: Recent Labs    07/23/17 0814 05/25/18 0805  WBC 6.1 5.9  NEUTROABS 3,050  --   HGB 13.6 13.6  HCT 40.0 38.6  MCV 88.9 86.4  PLT 327 334   Cardiac Enzymes: No results for  input(s): CKTOTAL, CKMB, CKMBINDEX, TROPONINI in the last 8760 hours. BNP: Invalid input(s): POCBNP No results found for: HGBA1C Lab Results  Component Value Date   TSH 0.58 02/02/2018   No results found for: VITAMINB12 No results found for: FOLATE No results found for: IRON, TIBC,  FERRITIN  Lipid Panel: Recent Labs    07/23/17 0814 02/02/18 0000 05/25/18 0805  CHOL 190 184 196  HDL 48* 55 52  LDLCALC 110* 107* 116*  TRIG 159* 120 168*  CHOLHDL 4.0 3.3 3.8   No results found for: HGBA1C  Procedures since last visit: No results found.   MMSE - Mini Mental State Exam 06/03/2018 02/04/2018 08/13/2017  Not completed: (No Data) (No Data) -  Orientation to time 5 5 5   Orientation to Place 4 5 3   Registration 3 3 3   Attention/ Calculation 5 5 5   Recall 2 3 3   Language- name 2 objects 2 2 2   Language- repeat 1 1 1   Language- follow 3 step command 3 2 3   Language- read & follow direction 1 1 1   Write a sentence 1 1 1   Copy design 1 1 1   Total score 28 29 28    Depression screen [facility] 2/9 07/01/2018 10/10/2017 08/13/2017 05/14/2016 05/16/2015  Decreased Interest 0 0 0 0 0  Down, Depressed, Hopeless 0 0 0 0 0  PHQ - 2 Score 0 0 0 0 0  Altered sleeping 3 - 0 - -  Tired, decreased energy 3 - 0 - -  Change in appetite 0 - 0 - -  Feeling bad or failure about yourself  0 - 0 - -  Trouble concentrating 0 - 0 - -  Moving slowly or fidgety/restless 0 - 0 - -  Suicidal thoughts 0 - 0 - -  PHQ-9 Score 6 - 0 - -    Assessment/Plan  1. Mild cognitive impairment MMSE 28/30, short term memory loss. Trial of donepezil not tolerated. Pt does not want to try any other medication for now. Denies to see neurology service. Monitor clinically for now  2. Intolerance of drug In about 3% cases, pt can have body pain per uptodate. Rare side effect of donepezil is neuromalignant syndrome. Will d/c donepezil from med list and list it in allergy list.      Labs/tests ordered:  Has pending future lab  Next appointment: reviewed care plan with patient and her husband. Follow in 3-4 months  Blanchie Serve, MD Internal Medicine [facility] [address] [place], [place] [address] Cell Phone (Monday-Friday 8 am - 5 pm): [phone] On Call:  [phone] and follow prompts after 5 pm and on weekends Office Phone: [phone] Office Fax: [phone]

## 2018-07-14 ENCOUNTER — Telehealth: Payer: Self-pay | Admitting: *Deleted

## 2018-07-14 NOTE — Telephone Encounter (Signed)
Frances Holland, husband called and stated that patient was recently seen and stated that they were given options and he is calling back stating that patient would like: 1. Referral to Neurology @ [facility] 2. Different medication for memory in Patch form.   Wound like this called into [facility].   Please Advise.

## 2018-07-15 ENCOUNTER — Other Ambulatory Visit: Payer: Self-pay | Admitting: Internal Medicine

## 2018-07-15 DIAGNOSIS — G3184 Mild cognitive impairment, so stated: Secondary | ICD-10-CM

## 2018-07-15 NOTE — Telephone Encounter (Signed)
Left a voicemail letting the patient know the instructions and phone number to the clinic in case they have any questions.

## 2018-07-15 NOTE — Progress Notes (Signed)
Pt agrees to neurology referral for cognitive impairment workup.

## 2018-07-15 NOTE — Telephone Encounter (Signed)
Neurology referral has been placed. Please notify patient/ family that they should receive a call about this in few days. Also, will hold off on starting any other memory medication until further evaluation by neurology service.

## 2018-07-21 ENCOUNTER — Ambulatory Visit
Admission: RE | Admit: 2018-07-21 | Discharge: 2018-07-21 | Disposition: A | Discharge status: Home / Self Care | Payer: PPO | Source: Ambulatory Visit | Attending: Internal Medicine | Admitting: Internal Medicine

## 2018-07-21 DIAGNOSIS — Z1231 Encounter for screening mammogram for malignant neoplasm of breast: Secondary | ICD-10-CM

## 2018-07-22 ENCOUNTER — Encounter: Payer: Self-pay | Admitting: Internal Medicine

## 2018-07-22 ENCOUNTER — Other Ambulatory Visit: Payer: Self-pay | Admitting: *Deleted

## 2018-07-22 NOTE — Patient Outreach (Signed)
[facility]) Care Management  07/22/2018  Frances Holland Oct 03, 1929 [id number]  Referral received from Nurse Call Center: Reason: Request for health Information:  Telephone call to spouse of patient; spouse/caregiver voices that patient has memory challenges. HIPPA verification received.  Spouse states that he and spouse live in independent living at [facility]. Voices that he takes spouse to all of MD appointments. States he also manages her medications and makes sure she takes medications as prescribed by her doctors.   Voices that patient has appointment with neurologist in October which was the earliest appointment that was available. Advised that he could put spouse's name on list to be called if earlier appointment becomes available. Spouse voices understanding.  States no further concerns.  Plan: Case closure.   Sherrin Daisy, RN BSN [place] Management Coordinator [facility] Care Management  [phone]

## 2018-08-30 IMAGING — MG 2D DIGITAL SCREENING UNILATERAL RIGHT MAMMOGRAM WITH CAD AND ADJ
6 series · 6 of 14 positions shown · non-contrast
Comparison: Previous exam(s).

CLINICAL DATA: Screening. History of left breast mastectomy in
5111.

EXAM:
2D DIGITAL SCREENING UNILATERAL RIGHT MAMMOGRAM WITH CAD AND ADJUNCT
TOMO

[R MLO]
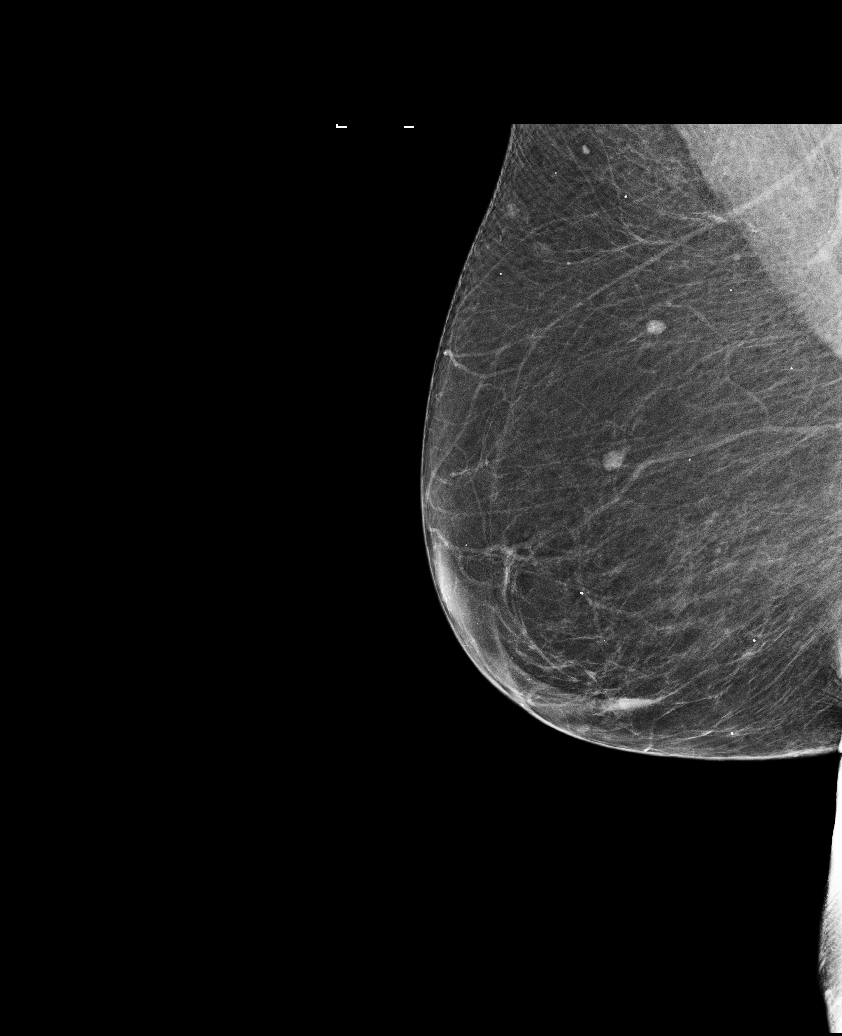

[R CC synth-2D]
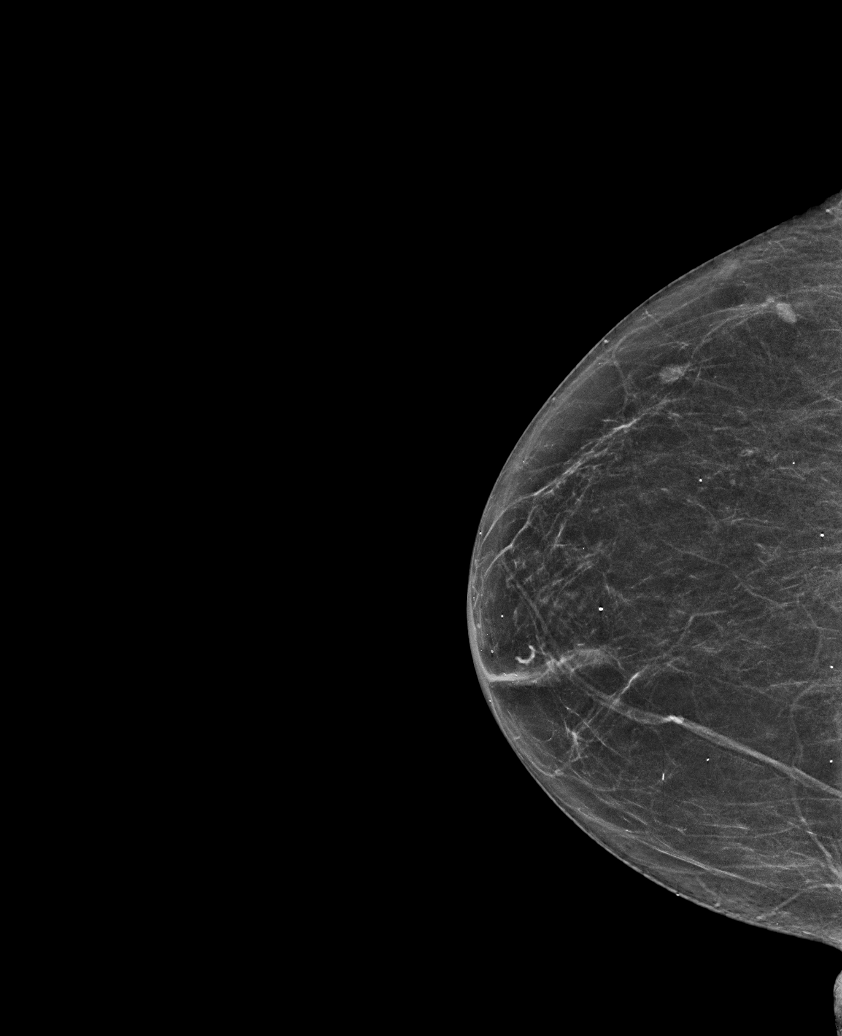

[R CC]
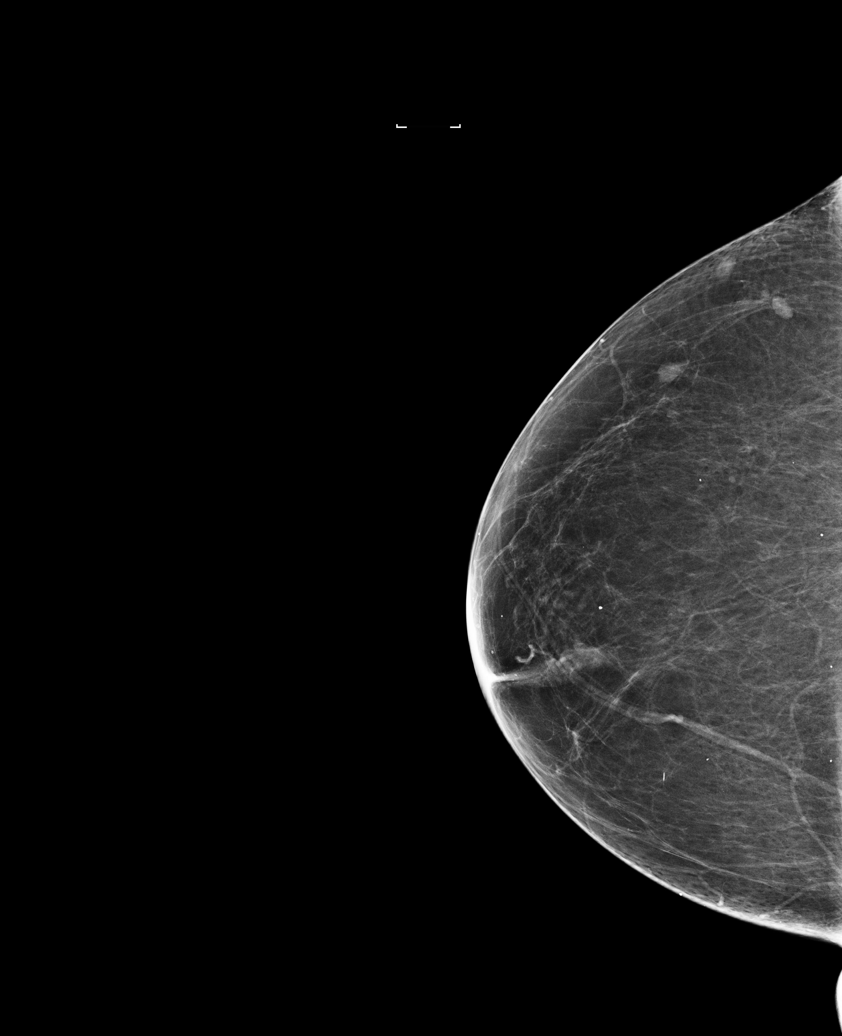

[R MLO synth-2D]
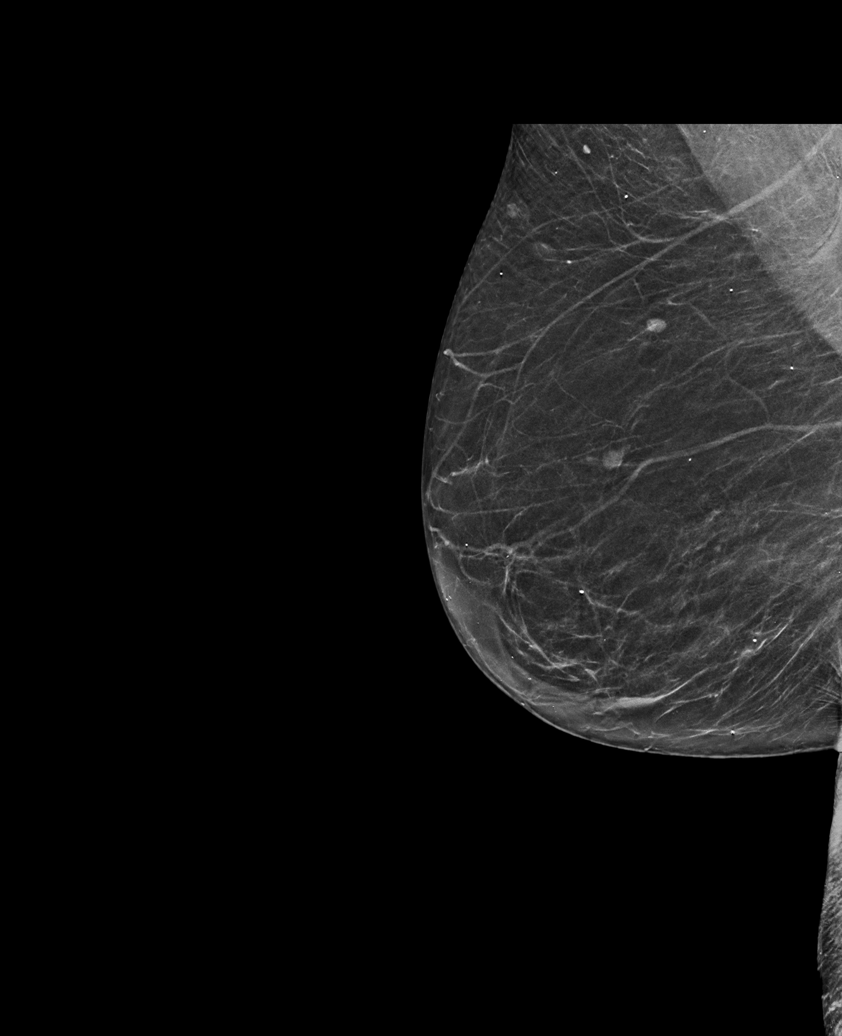

[R MLO tomo · tomo slice 30/59.0]
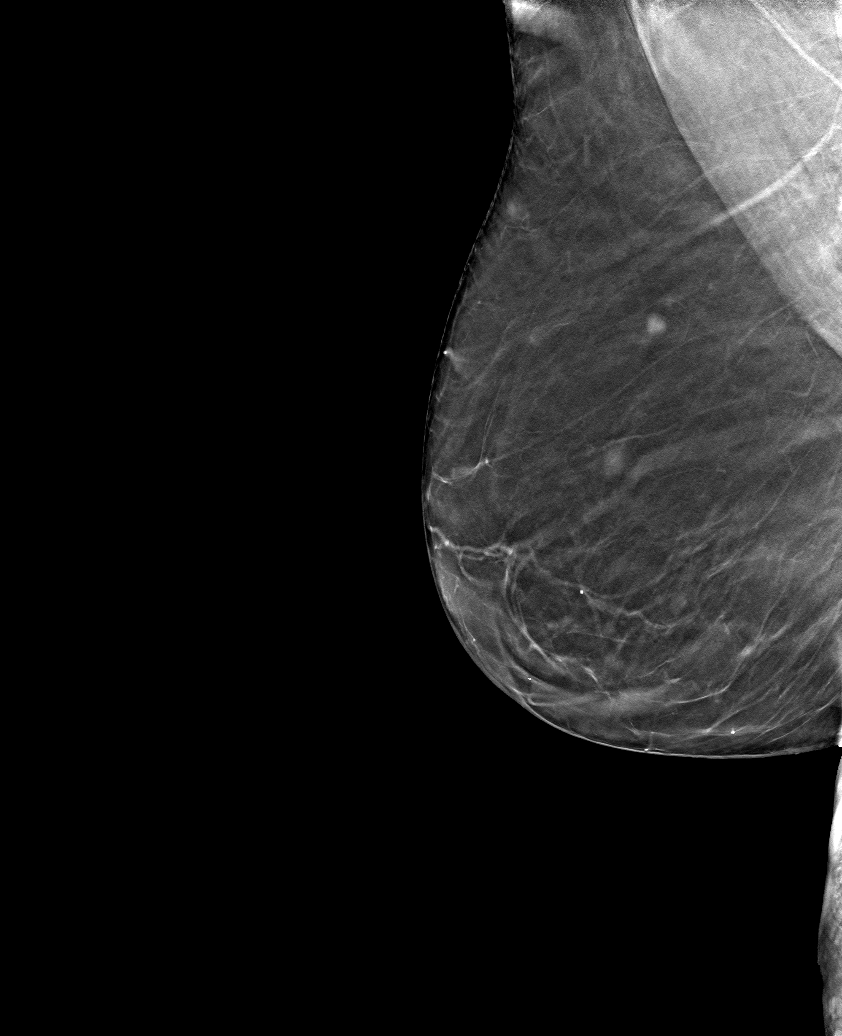

[R CC tomo · tomo slice 28/55.0]
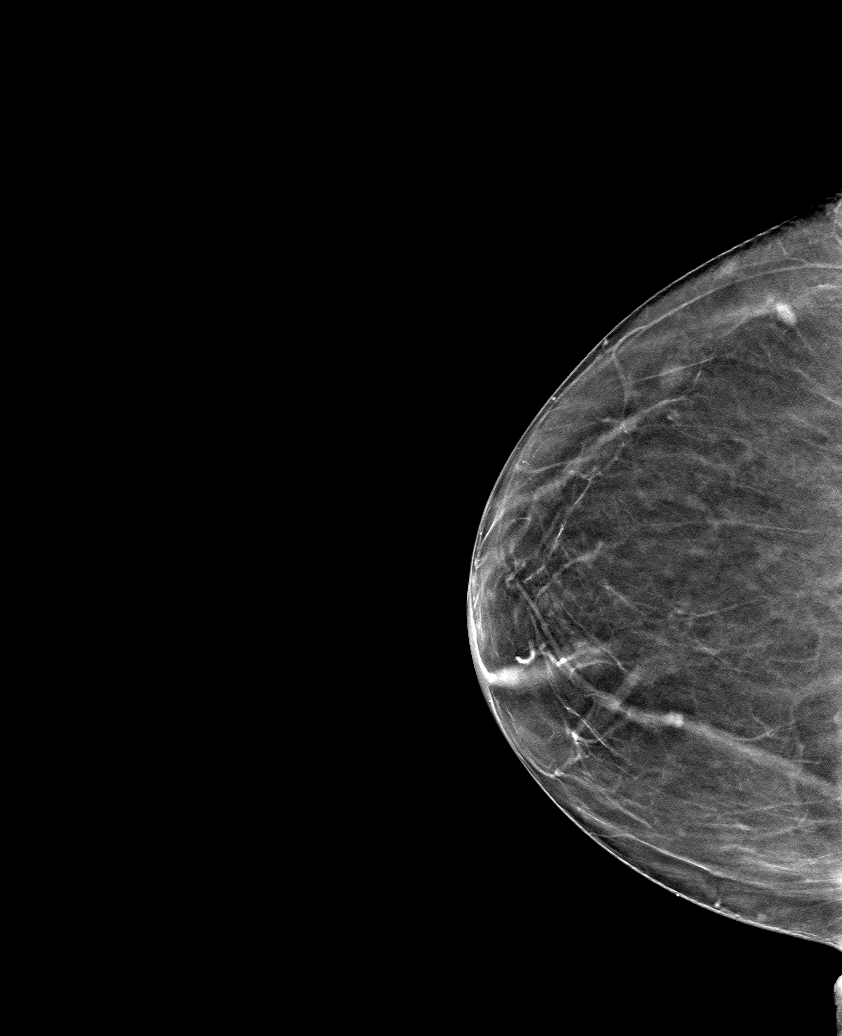

[6 of 14 positions shown; findings below may reference images not displayed]

ACR Breast Density Category b: There are scattered areas of
fibroglandular density.
FINDINGS: The patient has had a left mastectomy. There are no findings
suspicious for malignancy. Images were processed with CAD.
IMPRESSION: No mammographic evidence of malignancy. A result letter of this
screening mammogram will be mailed directly to the patient.

RECOMMENDATION:
Screening mammogram in one year.  (Code:[id number]-9W1)

BI-RADS CATEGORY  1: Negative.

## 2018-09-04 DIAGNOSIS — Z6827 Body mass index (BMI) 27.0-27.9, adult: Secondary | ICD-10-CM | POA: Diagnosis not present

## 2018-09-04 DIAGNOSIS — C50919 Malignant neoplasm of unspecified site of unspecified female breast: Secondary | ICD-10-CM | POA: Diagnosis not present

## 2018-09-04 DIAGNOSIS — E7849 Other hyperlipidemia: Secondary | ICD-10-CM | POA: Diagnosis not present

## 2018-09-04 DIAGNOSIS — M542 Cervicalgia: Secondary | ICD-10-CM | POA: Diagnosis not present

## 2018-09-04 DIAGNOSIS — E890 Postprocedural hypothyroidism: Secondary | ICD-10-CM | POA: Diagnosis not present

## 2018-09-04 DIAGNOSIS — M810 Age-related osteoporosis without current pathological fracture: Secondary | ICD-10-CM | POA: Diagnosis not present

## 2018-09-04 DIAGNOSIS — I10 Essential (primary) hypertension: Secondary | ICD-10-CM | POA: Diagnosis not present

## 2018-09-04 DIAGNOSIS — C73 Malignant neoplasm of thyroid gland: Secondary | ICD-10-CM | POA: Diagnosis not present

## 2018-09-04 DIAGNOSIS — G3184 Mild cognitive impairment, so stated: Secondary | ICD-10-CM | POA: Diagnosis not present

## 2018-09-04 DIAGNOSIS — M545 Low back pain: Secondary | ICD-10-CM | POA: Diagnosis not present

## 2018-09-07 ENCOUNTER — Other Ambulatory Visit: Payer: Self-pay | Admitting: Endocrinology

## 2018-09-07 DIAGNOSIS — M542 Cervicalgia: Secondary | ICD-10-CM

## 2018-09-07 DIAGNOSIS — C73 Malignant neoplasm of thyroid gland: Secondary | ICD-10-CM

## 2018-09-09 ENCOUNTER — Encounter: Payer: Self-pay | Admitting: Neurology

## 2018-09-09 ENCOUNTER — Other Ambulatory Visit: Payer: Self-pay

## 2018-09-09 ENCOUNTER — Ambulatory Visit (INDEPENDENT_AMBULATORY_CARE_PROVIDER_SITE_OTHER): Payer: PPO | Admitting: Neurology

## 2018-09-09 ENCOUNTER — Encounter

## 2018-09-09 VITALS — BP 171/82 | HR 71 | Ht 64.0 in | Wt 159.5 lb

## 2018-09-09 DIAGNOSIS — E538 Deficiency of other specified B group vitamins: Secondary | ICD-10-CM

## 2018-09-09 DIAGNOSIS — R413 Other amnesia: Secondary | ICD-10-CM | POA: Diagnosis not present

## 2018-09-09 MED ORDER — MEMANTINE HCL 5 MG PO TABS: ORAL_TABLET | ORAL | 0 refills | Status: DC

## 2018-09-09 NOTE — Progress Notes (Signed)
Faxed printed/signed rx memantine 5mg  tablet to [facility] at [phone]. Received fax confirmation.

## 2018-09-09 NOTE — Progress Notes (Signed)
Reason for visit: Memory disorder  Referring physician: Dr. Benny Lennert Frances Holland is a 84 y.o. female  History of present illness:  Frances Holland is an 84 year old right-handed white female with a history of a memory disorder that has been present for about 2 years.  The patient comes in with her husband today.  The patient has noted some problems with remembering names for people and some difficulty with short-term memory.  She has not driven in a motor vehicle since she moved from [place], [place] 4 years ago.  The patient gets help from her husband with keeping up with medications and appointments, the husband does all the finances.  Otherwise, the patient is able to read books which she does frequently, she does crossword puzzles.  There have been gradual changes in the memory over the last year or so.  The patient does report some chronic insomnia, she has been under a lot of stress recently as her daughter has been diagnosed with stage IV ovarian cancer.  The patient reports no numbness or weakness of the extremities, she denies any balance issues or any falls.  Both of her parents lived well into their 28s, both had some mild memory problems before their death.  The patient has been tried on Aricept but could not tolerate the medication due to nausea and difficulty sleeping.  The patient is sent to this office for an evaluation.  Past Medical History:  Diagnosis Date  . Back pain   . Cancer [facility])    left breaset mastectomy   . Candidiasis of skin and nails   . Cellulitis and abscess of upper arm and forearm   . Heartburn   . Hemorrhoids 08/20/2016  . History of breast cancer 2000   History left mastectomy  . 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 Hypertension   . Insomnia   . Knee pain, bilateral 2014  . Lymphedema    Left arm following mastectomy  . Memory impairment   . Memory loss   . Migraine 05/16/2012  . Migraine without aura, without mention of intractable  migraine without mention of status migrainosus    patient denies at preop of 08/08/16   . Other and unspecified hyperlipidemia   . Other bursitis disorders   . Other infective bursitis, left shoulder   . Pain in joint, ankle and foot   . Pain in joint, hand   . Pain in joint, lower leg   . Pain in joint, shoulder region   . Pain in joint, shoulder region 04/23/2016  . Pain in limb   . Rectal bleeding 08/20/2016  . Senile osteoporosis   . Symptomatic menopausal or female climacteric states   . Syncope and collapse   . Unspecified hypertensive heart disease without heart failure 1995  . Unspecified hypothyroidism   . Unspecified polyarthropathy or polyarthritis, site unspecified   . Unspecified urinary incontinence   . Unspecified visual loss   . Xerophthalmia     Past Surgical History:  Procedure Laterality Date  . BILATERAL OOPHORECTOMY  1981  . BREAST RECONSTRUCTION Left 01/2000  . BREAST REDUCTION SURGERY Right 01/2000  . CATARACT EXTRACTION Right 09/04/2005  . COLONOSCOPY  01/2004  . MASTECTOMY Left 10/1999  . THYROID LOBECTOMY Left 08/14/2016   Procedure: NEAR TOTAL THYROIDECTOMY;  Surgeon: Johnathan Hausen, MD;  Location: [facility];  Service: General;  Laterality: Left;  . TOTAL ABDOMINAL HYSTERECTOMY W/ BILATERAL SALPINGOOPHORECTOMY  1981  . TOTAL KNEE ARTHROPLASTY Right 2014  Family History  Problem Relation Age of Onset  . Heart disease Mother   . Dementia Mother   . Dementia Father   . Asthma Son     Social history:  reports that she has never smoked. She has never used smokeless tobacco. She reports that she does not drink alcohol or use drugs.  Medications:  Prior to Admission medications   Medication Sig Start Date End Date Taking? Authorizing Provider  amLODipine (NORVASC) 2.5 MG tablet TAKE 1 TABLET BY MOUTH ONCE DAILY TO LOWER BLOOD PRESSURE 06/24/18  Yes Pandey, Mahima, MD  Apoaequorin (PREVAGEN PO) Take 1 tablet by mouth every morning.   Yes [provider]  aspirin EC 81 MG tablet Take 81 mg by mouth daily.   Yes [provider]  Calcium Carb-Cholecalciferol (CALCIUM 600-D PO) Take 1 tablet by mouth daily.   Yes [provider]  Cholecalciferol 5000 units capsule Take 5,000 Units by mouth daily.   Yes [provider]  Ibuprofen-diphenhydrAMINE Cit (ADVIL PM PO) Take 1 Dose by mouth at bedtime.   Yes [provider]  levothyroxine (SYNTHROID, LEVOTHROID) 100 MCG tablet Take 100 mcg by mouth daily before breakfast.   Yes [provider]  losartan-hydrochlorothiazide (HYZAAR) 100-12.5 MG tablet TAKE 1 TABLET BY MOUTH ONCE DAILY IN THE MORNING FOR BLOOD PRESSURE 06/15/18  Yes Blanchie Serve, MD  Melatonin 3 MG TABS Take 6 mg by mouth at bedtime.    Yes [provider]  Multiple Vitamins-Minerals (CENTRUM SILVER PO) Take 1 tablet by mouth daily.    Yes [provider]  naproxen sodium (ALEVE) 220 MG tablet Take 1 to 2 tablet at bedtime as needed for back pain 06/03/18  Yes Blanchie Serve, MD  Omega-3 Fatty Acids (OMEGA-3 CF PO) Take 520 mg by mouth daily.    Yes [provider]  Polyethyl Glycol-Propyl Glycol (SYSTANE) 0.4-0.3 % SOLN Apply to eye. Use two drops of Systane as needed for dry eyes   Yes [provider]  Simethicone (GAS RELIEF 125 MAX ST PO) Take by mouth. Take 1-2 tablets as needed after meals   Yes [provider]  memantine (NAMENDA) 5 MG tablet Take 1 tablet daily for one week, then take 1 tablet twice daily for one week, then take 1 tablet in the morning and 2 in the evening for one week, then take 2 tablets twice daily 09/09/18   Kathrynn Ducking, MD      Allergies  Allergen Reactions  . Donepezil Hcl     Body pain and stiffness   . Sulfa Antibiotics   . Sulfamethoxazole Rash    ROS:  Out of a complete 14 system review of symptoms, the patient complains only of the following symptoms, and all other reviewed systems are  negative.  Memory disturbance  Blood pressure (!) 171/82, pulse 71, height 5\' 4"  (1.626 m), weight 159 lb 8 oz (72.3 kg).  Physical Exam  General: The patient is alert and cooperative at the time of the examination.  Eyes: Pupils are equal, round, and reactive to light. Discs are flat bilaterally.  Neck: The neck is supple, no carotid bruits are noted.  Respiratory: The respiratory examination is clear.  Cardiovascular: The cardiovascular examination reveals a regular rate and rhythm, no obvious murmurs or rubs are noted.  Skin: Extremities are without significant edema.  Neurologic Exam  Mental status: The patient is alert and oriented x 3 at the time of the examination. The patient has apparent  normal recent and remote memory, with an apparently normal attention span and concentration ability.  Cranial nerves: Facial symmetry is present. There is good sensation of the face to pinprick and soft touch bilaterally. The strength of the facial muscles and the muscles to head turning and shoulder shrug are normal bilaterally. Speech is well enunciated, no aphasia or dysarthria is noted. Extraocular movements are full. Visual fields are full. The tongue is midline, and the patient has symmetric elevation of the soft palate. No obvious hearing deficits are noted.  Motor: The motor testing reveals 5 over 5 strength of all 4 extremities. Good symmetric motor tone is noted throughout.  Sensory: Sensory testing is intact to pinprick, soft touch, vibration sensation, and position sense on all 4 extremities. No evidence of extinction is noted.  Coordination: Cerebellar testing reveals good finger-nose-finger and heel-to-shin bilaterally.  Gait and station: Gait is normal. Tandem gait is slightly unsteady. Romberg is negative. No drift is seen.  Reflexes: Deep tendon reflexes are symmetric and normal bilaterally. Toes are downgoing bilaterally.   Assessment/Plan:  1.  Memory  disturbance  The patient has had some mild troubles with memory over the last couple years.  The patient will be started on Namenda, she will be worked up on the dose over the next month.  If she tolerates this, she will call for a maintenance dose prescription of 10 mg twice daily.  The patient will have blood work done today.  She will follow-up in 6 months.  Jill Alexanders MD 09/09/2018 11:44 AM  [facility] [address] [place], [place] [address]  Phone [phone] Fax [phone]

## 2018-09-10 ENCOUNTER — Telehealth: Payer: Self-pay | Admitting: *Deleted

## 2018-09-10 LAB — SEDIMENTATION RATE: SED RATE: 3 mm/h (ref 0–40)

## 2018-09-10 LAB — VITAMIN B12: Vitamin B-12: 601 pg/mL (ref 232–1245)

## 2018-09-10 NOTE — Telephone Encounter (Signed)
-----   Message from Kathrynn Ducking, MD sent at 09/10/2018  8:06 AM EDT -----  The blood work results are unremarkable. Please call the patient.  ----- Message ----- From: Lavone Neri Lab Results In Sent: 09/10/2018   5:39 AM EDT To: Kathrynn Ducking, MD

## 2018-09-10 NOTE — Telephone Encounter (Signed)
Called and spoke with husband about unremarkable labs per Dr. Jannifer Franklin note. He verbalized understanding.

## 2018-09-15 ENCOUNTER — Ambulatory Visit
Admission: RE | Admit: 2018-09-15 | Discharge: 2018-09-15 | Disposition: A | Discharge status: Home / Self Care | Payer: PPO | Source: Ambulatory Visit | Attending: Endocrinology | Admitting: Endocrinology

## 2018-09-15 DIAGNOSIS — M542 Cervicalgia: Secondary | ICD-10-CM

## 2018-09-15 DIAGNOSIS — C73 Malignant neoplasm of thyroid gland: Secondary | ICD-10-CM

## 2018-09-15 DIAGNOSIS — E041 Nontoxic single thyroid nodule: Secondary | ICD-10-CM | POA: Diagnosis not present

## 2018-10-01 ENCOUNTER — Ambulatory Visit: Payer: PPO | Admitting: Podiatry

## 2018-10-01 ENCOUNTER — Telehealth: Payer: Self-pay | Admitting: Neurology

## 2018-10-01 ENCOUNTER — Encounter: Payer: Self-pay | Admitting: Podiatry

## 2018-10-01 DIAGNOSIS — M2010 Hallux valgus (acquired), unspecified foot: Secondary | ICD-10-CM | POA: Diagnosis not present

## 2018-10-01 DIAGNOSIS — L84 Corns and callosities: Secondary | ICD-10-CM | POA: Diagnosis not present

## 2018-10-01 MED ORDER — MEMANTINE HCL 10 MG PO TABS: 10.0000 mg | ORAL_TABLET | Freq: Two times a day (BID) | ORAL | 1 refills | Status: DC

## 2018-10-01 NOTE — Telephone Encounter (Signed)
Pts husband [name] on [place]) requesting a call stating the pt will be out of memantine (NAMENDA) 5 MG tablet next week, stating she is tolerating the medication well

## 2018-10-01 NOTE — Telephone Encounter (Signed)
LMOM for Mr. Zinni to call back.  Since Mrs. Anglada has tolerated the Namenda 5mg  bid,  Dr. Jannifer Franklin will increase that to 10mg  bid. I need to confirm that he would like this rx. sent to [facility]

## 2018-10-01 NOTE — Addendum Note (Signed)
Addended by: France Ravens I on: 10/01/2018 02:23 PM   Modules accepted: Orders

## 2018-10-01 NOTE — Telephone Encounter (Signed)
Pt husband has called back, he says sending to [facility] is fine but he would still like  A call from RN Faith re: concerns about medication.  Husband will be home until 2 leave message if unavailable

## 2018-10-01 NOTE — Telephone Encounter (Signed)
Spoke with Mr. Frances Holland. He sts. his wife has tolerated Namenda without any side effects, is now on 5mg , 2 tabs bid, would like to continue this. New rx. for Namenda 10mg  bid escribed to [facility]./fim

## 2018-10-02 NOTE — Progress Notes (Signed)
Subjective:   Patient ID: Frances Holland, female   DOB: 84 y.o.   MRN: [id number]   HPI Patient presents stating she is having pain in the second toe of her left foot and while it is not as bad she is forming a callus and she is concerned about digital deformity   ROS      Objective:  Physical Exam  Neurovascular status unchanged with patient found to have keratotic lesion between the hallux second digit left with discomfort in the second toe.  The hallux does have a small bone spur formation     Assessment:  Structural changes in the hallux second digit with areas that I was not able to correct with reduced edema from the previous surgery but is still having keratotic lesion formation     Plan:  Explained condition and at this point I have recommended debridement padding and discussed possible exostectomy in the future educating her on this procedure.  Patient's discharge will be seen back as symptoms indicate

## 2018-10-05 ENCOUNTER — Telehealth: Payer: Self-pay | Admitting: Neurology

## 2018-10-05 MED ORDER — MEMANTINE HCL 5 MG PO TABS: ORAL_TABLET | ORAL | 5 refills | Status: DC

## 2018-10-05 NOTE — Telephone Encounter (Signed)
Spoke with Joe at [facility] and cancelled the Namenda 10mg  bid rx/fim

## 2018-10-05 NOTE — Telephone Encounter (Signed)
I called the patient and talk with husband.  The patient seemed to tolerate the 15 mg dosing of Namenda, when she went to the 20 mg she did have one episode of dizziness and some constipation.  We will cut back and use the 5 mg tablets taking 1 in the morning and 2 in the evening.

## 2018-10-05 NOTE — Telephone Encounter (Signed)
Patient's husband [name] (on [place]) calling to discuss reaction to memantine (NAMENDA) 10 MG tablet. Last Friday and Saturday patient was constipated and while shopping on Saturday the patient was dizzy which lasted about 15 minutes.

## 2018-10-05 NOTE — Addendum Note (Signed)
Addended by: Kathrynn Ducking on: 10/05/2018 02:47 PM   Modules accepted: Orders

## 2018-10-05 NOTE — Telephone Encounter (Signed)
Spoke with pt's husband. Pt. did ok on Namenda titration, thru the 5mg  qam/10mg  qhs dose.  Once she increased to 10mg  bid, she had constipation and dizziness.  She stopped Namenda on 10/26. He husband sts. memory has continued to decline, despite Namenda, so not sure if it is beneficial for her to continue it. He is agreeable to restarting 5mg  qam and 10mg  qhs as pt. did well on this dose.  Will check with Dr. Jannifer Franklin to see what he feels is best.Husband also sts. pt's dtr. is being treated for stage 4 ovarian cancer, so feels stress may be affecting her memory also/fim

## 2018-10-06 NOTE — Telephone Encounter (Signed)
Patient's husband calling stating she is stopping Namenda. Today in the recliner she was dizzy and seemed very confused. Her daughter is having surgery today and patient did not realize. Please call and discuss.

## 2018-10-06 NOTE — Telephone Encounter (Signed)
I called the patient. I talked with the husband. She is not able to tolerate the Namenda. They will stop the medication.

## 2018-10-30 ENCOUNTER — Non-Acute Institutional Stay: Payer: PPO

## 2018-10-30 VITALS — BP 138/78 | HR 77 | Temp 98.0°F | Ht 64.0 in | Wt 165.0 lb

## 2018-10-30 DIAGNOSIS — Z0000 Encounter for general adult medical examination without abnormal findings: Secondary | ICD-10-CM

## 2018-10-30 NOTE — Progress Notes (Signed)
Subjective:   Frances Holland is a 84 y.o. female who presents for Medicare Annual (Subsequent) preventive examination at [facility]  Last AWV-10/10/2017    Objective:     Vitals: BP 138/78 (BP Location: Right Arm, Patient Position: Sitting)   Pulse 77   Temp 98 F (36.7 C) (Oral)   Ht 5\' 4"  (1.626 m)   Wt 165 lb (74.8 kg)   SpO2 96%   BMI 28.32 kg/m   Body mass index is 28.32 kg/m.  Advanced Directives 10/30/2018 07/01/2018 06/03/2018 02/04/2018 10/10/2017 07/25/2017 03/25/2017  Does Patient Have a Medical Advance Directive? Yes Yes Yes Yes Yes Yes Yes  Type of Paramedic of [place];Out of facility DNR (pink MOST or yellow form) [place];Living will;Out of facility DNR (pink MOST or yellow form) [place];Living will;Out of facility DNR (pink MOST or yellow form) [place];Living will [place] of facility DNR (pink MOST or yellow form) [place];Living will  Does patient want to make changes to medical advance directive? No - Patient declined No - Patient declined No - Patient declined Yes (MAU/Ambulatory/Procedural Areas - Information given) No - Patient declined No - Patient declined -  Copy of [place] in Chart? Yes - validated most recent copy scanned in chart (See row information) Yes Yes Yes Yes - Yes  Pre-existing out of facility DNR order (yellow form or pink MOST form) Pink MOST form placed in chart (order not valid for inpatient use) Pink MOST form placed in chart (order not valid for inpatient use) Pink MOST form placed in chart (order not valid for inpatient use) - - - -    Tobacco Social History   Tobacco Use  Smoking Status Never Smoker  Smokeless Tobacco Never Used     Counseling given: Not Answered   Clinical Intake:  Pre-visit preparation completed: No  Pain : No/denies pain      Diabetes: No  How often do you need to have someone help you when you read instructions, pamphlets, or other written materials from your doctor or pharmacy?: 1 - Never What is the last grade level you completed in school?: 2 years college  Interpreter Needed?: No  Information entered by :: Tyson Dense, RN  Past Medical History:  Diagnosis Date  . Back pain   . Cancer [facility])    left breaset mastectomy   . Candidiasis of skin and nails   . Cellulitis and abscess of upper arm and forearm   . Heartburn   . Hemorrhoids 08/20/2016  . History of breast cancer 2000   History left mastectomy  . 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 Hypertension   . Insomnia   . Knee pain, bilateral 2014  . Lymphedema    Left arm following mastectomy  . Memory impairment   . Memory loss   . Migraine 05/16/2012  . Migraine without aura, without mention of intractable migraine without mention of status migrainosus    patient denies at preop of 08/08/16   . Other and unspecified hyperlipidemia   . Other bursitis disorders   . Other infective bursitis, left shoulder   . Pain in joint, ankle and foot   . Pain in joint, hand   . Pain in joint, lower leg   . Pain in joint, shoulder region   . Pain in joint, shoulder region 04/23/2016  . Pain in limb   .  Rectal bleeding 08/20/2016  . Senile osteoporosis   . Symptomatic menopausal or female climacteric states   . Syncope and collapse   . Unspecified hypertensive heart disease without heart failure 1995  . Unspecified hypothyroidism   . Unspecified polyarthropathy or polyarthritis, site unspecified   . Unspecified urinary incontinence   . Unspecified visual loss   . Xerophthalmia    Past Surgical History:  Procedure Laterality Date  . BILATERAL OOPHORECTOMY  1981  . BREAST RECONSTRUCTION Left 01/2000  . BREAST REDUCTION SURGERY Right 01/2000  . CATARACT EXTRACTION Right 09/04/2005  . COLONOSCOPY  01/2004  . MASTECTOMY Left 10/1999  .  THYROID LOBECTOMY Left 08/14/2016   Procedure: NEAR TOTAL THYROIDECTOMY;  Surgeon: Johnathan Hausen, MD;  Location: [facility];  Service: General;  Laterality: Left;  . TOTAL ABDOMINAL HYSTERECTOMY W/ BILATERAL SALPINGOOPHORECTOMY  1981  . TOTAL KNEE ARTHROPLASTY Right 2014   Family History  Problem Relation Age of Onset  . Heart disease Mother   . Dementia Mother   . Dementia Father   . Asthma Son    Social History   Socioeconomic History  . Marital status: Married    Spouse name: Not on file  . Number of children: 3  . Years of education: 1  . Highest education level: Not on file  Occupational History  . Occupation: retired Estate agent   Social Needs  . Financial resource strain: Not hard at all  . Food insecurity:    Worry: Never true    Inability: Never true  . Transportation needs:    Medical: No    Non-medical: No  Tobacco Use  . Smoking status: Never Smoker  . Smokeless tobacco: Never Used  Substance and Sexual Activity  . Alcohol use: No  . Drug use: No  . Sexual activity: Not on file  Lifestyle  . Physical activity:    Days per week: 7 days    Minutes per session: 10 min  . Stress: To some extent  Relationships  . Social connections:    Talks on phone: More than three times a week    Gets together: More than three times a week    Attends religious service: More than 4 times per year    Active member of club or organization: No    Attends meetings of clubs or organizations: Never    Relationship status: Married  Other Topics Concern  . Not on file  Social History Narrative   [facility] at [facility] since 07/07/2014   Married -[name]   Never smoked   Right handed     Alcohol none   Caffeine 2 cups of coffee daily   Exercise walks daily, exercise class half hour 2-3 times a week   Living will,  POA   Patient believes that her memory is OK except for recall of names. Her husband is concerned about it. She scored 29/30 on MMSE in Dec 2017. They both  have concerns about the future if he precedes her in death, but he has written down how he keeps accounts and has scheduled an appt with an accountant to do their taxes. They are a 2nd marriage and have his and her families although the "children" get along. They have been married 30 years.    Outpatient Encounter Medications as of 10/30/2018  Medication Sig  . Apoaequorin (PREVAGEN PO) Take 1 tablet by mouth every morning.  Marland Kitchen aspirin EC 81 MG tablet Take 81 mg by mouth daily.  . Calcium  Carb-Cholecalciferol (CALCIUM 600-D PO) Take 1 tablet by mouth daily.  . Cholecalciferol 5000 units capsule Take 5,000 Units by mouth daily.  . Ibuprofen-diphenhydrAMINE Cit (ADVIL PM PO) Take 1 Dose by mouth at bedtime.  Marland Kitchen levothyroxine (SYNTHROID, LEVOTHROID) 100 MCG tablet Take 100 mcg by mouth daily before breakfast.  . losartan-hydrochlorothiazide (HYZAAR) 100-12.5 MG tablet TAKE 1 TABLET BY MOUTH ONCE DAILY IN THE MORNING FOR BLOOD PRESSURE  . Melatonin 3 MG TABS Take 6 mg by mouth at bedtime.   . Multiple Vitamins-Minerals (CENTRUM SILVER PO) Take 1 tablet by mouth daily.   . naproxen sodium (ALEVE) 220 MG tablet Take 1 to 2 tablet at bedtime as needed for back pain  . Omega-3 Fatty Acids (OMEGA-3 CF PO) Take 520 mg by mouth daily.   Vladimir Faster Glycol-Propyl Glycol (SYSTANE) 0.4-0.3 % SOLN Apply to eye. Use two drops of Systane as needed for dry eyes  . Simethicone (GAS RELIEF 125 MAX ST PO) Take by mouth. Take 1-2 tablets as needed after meals  . amLODipine (NORVASC) 2.5 MG tablet TAKE 1 TABLET BY MOUTH ONCE DAILY TO LOWER BLOOD PRESSURE  . memantine (NAMENDA) 5 MG tablet 1 tablet in the morning, 2 in the evening   No facility-administered encounter medications on file as of 10/30/2018.     Activities of Daily Living In your present state of health, do you have any difficulty performing the following activities: 10/30/2018  Hearing? N  Vision? N  Difficulty concentrating or making decisions? Y   Walking or climbing stairs? N  Dressing or bathing? N  Doing errands, shopping? N  Preparing Food and eating ? N  Using the Toilet? N  In the past six months, have you accidently leaked urine? Y  Comment wears pad  Do you have problems with loss of bowel control? N  Managing your Medications? N  Managing your Finances? N  Housekeeping or managing your Housekeeping? N  Some recent data might be hidden    Patient Care Team: Ngetich, Nelda Bucks, NP as PCP - General (Family Medicine) Reynold Bowen, MD as Consulting Physician (Endocrinology)    Assessment:   This is a routine wellness examination for Bee.  Exercise Activities and Dietary recommendations Current Exercise Habits: Home exercise routine, Type of exercise: stretching, Time (Minutes): 10, Frequency (Times/Week): 7, Weekly Exercise (Minutes/Week): 70, Exercise limited by: None identified  Goals    . Maintain LIfestyle     Starting today pt will maintain lifestyle.        Fall Risk Fall Risk  10/30/2018 10/10/2017 05/14/2016 01/23/2016 11/14/2015  Falls in the past year? 0 No No No No  Number falls in past yr: 0 - - - -  Injury with Fall? 0 - - - -   Is the patient's home free of loose throw rugs in walkways, pet beds, electrical cords, etc?   yes      Grab bars in the bathroom? yes      Handrails on the stairs?   yes      Adequate lighting?   yes  Depression Screen PHQ 2/9 Scores 10/30/2018 07/01/2018 10/10/2017 08/13/2017  PHQ - 2 Score 0 0 0 0  PHQ- 9 Score - 6 - 0     Cognitive Function completed within the last year MMSE - Mini Mental State Exam 09/09/2018 06/03/2018 02/04/2018 08/13/2017 08/13/2017  Not completed: - (No Data) (No Data) - -  Orientation to time 5 5 5 5 5   Orientation to Place 4 4  5 3 3   Registration 3 3 3 3 3   Attention/ Calculation 5 5 5 5 5   Recall 0 2 3 3 3   Language- name 2 objects 2 2 2 2 2   Language- repeat 1 1 1 1 1   Language- follow 3 step command 3 3 2 3 3   Language- read & follow  direction 1 1 1 1 1   Write a sentence 1 1 1 1 1   Copy design 1 1 1 1 1   Total score 26 28 29 28 28         Immunization History  Administered Date(s) Administered  . Influenza, High Dose Seasonal PF 09/17/2017  . Influenza,inj,Quad PF,6+ Mos 09/10/2018  . Influenza-Unspecified 08/10/2013, 09/07/2015, 09/13/2016  . Pneumococcal Conjugate-13 06/08/2017  . Pneumococcal Polysaccharide-23 12/09/2002, 10/22/2017  . Tdap 02/21/2014, 10/29/2017  . Zoster Recombinat (Shingrix) 07/18/2017, 01/10/2018    Qualifies for Shingles Vaccine?up to date, completed  Screening Tests Health Maintenance  Topic Date Due  . TETANUS/TDAP  10/30/2027  . INFLUENZA VACCINE  Completed  . DEXA SCAN  Completed  . PNA vac Low Risk Adult  Completed    Cancer Screenings: Lung: Low Dose CT Chest recommended if Age 28-80 years, 30 pack-year currently smoking OR have quit w/in 15years. Patient does not qualify. Breast:  Up to date on Mammogram? Yes   Up to date of Bone Density/Dexa? Yes Colorectal: up to date  Additional Screenings:  Hepatitis C Screening: declined Prevnar due: stated she will get it soon    Plan:    I have personally reviewed and addressed the Medicare Annual Wellness questionnaire and have noted the following in the patient's chart:  A. Medical and social history B. Use of alcohol, tobacco or illicit drugs  C. Current medications and supplements D. Functional ability and status E.  Nutritional status F.  Physical activity G. Advance directives H. List of other physicians I.  Hospitalizations, surgeries, and ER visits in previous 12 months J.  [place] to include hearing, vision, cognitive, depression L. Referrals and appointments - none  In addition, I have reviewed and discussed with patient certain preventive protocols, quality metrics, and best practice recommendations. A written personalized care plan for preventive services as well as general preventive health  recommendations were provided to patient.  See attached scanned questionnaire for additional information.   Signed,   Tyson Dense, RN Nurse Health Advisor  Patient Concerns: none

## 2018-10-30 NOTE — Patient Instructions (Signed)
Frances Holland , Thank you for taking time to come for your Medicare Wellness Visit. I appreciate your ongoing commitment to your health goals. Please review the following plan we discussed and let me know if I can assist you in the future.   Screening recommendations/referrals: Colonoscopy excluded, over age 82 Mammogram excluded, over age 82 Bone Density up to date Recommended yearly ophthalmology/optometry visit for glaucoma screening and checkup Recommended yearly dental visit for hygiene and checkup  Vaccinations: Influenza vaccine up to date Pneumococcal vaccine 84 due Tdap vaccine up to date, due 10/30/2027 Shingles vaccine up to date, completed    Advanced directives: in chart  Conditions/risks identified: none  Next appointment: none upcoming   Preventive Care 82 Years and Older, Female Preventive care refers to lifestyle choices and visits with your health care provider that can promote health and wellness. What does preventive care include?  A yearly physical exam. This is also called an annual well check.  Dental exams once or twice a year.  Routine eye exams. Ask your health care provider how often you should have your eyes checked.  Personal lifestyle choices, including:  Daily care of your teeth and gums.  Regular physical activity.  Eating a healthy diet.  Avoiding tobacco and drug use.  Limiting alcohol use.  Practicing safe sex.  Taking low-dose aspirin every day.  Taking vitamin and mineral supplements as recommended by your health care provider. What happens during an annual well check? The services and screenings done by your health care provider during your annual well check will depend on your age, overall health, lifestyle risk factors, and family history of disease. Counseling  Your health care provider may ask you questions about your:  Alcohol use.  Tobacco use.  Drug use.  Emotional well-being.  Home and relationship  well-being.  Sexual activity.  Eating habits.  History of falls.  Memory and ability to understand (cognition).  Work and work Statistician.  Reproductive health. Screening  You may have the following tests or measurements:  Height, weight, and BMI.  Blood pressure.  Lipid and cholesterol levels. These may be checked every 5 years, or more frequently if you are over 15 years old.  Skin check.  Lung cancer screening. You may have this screening every year starting at age 51 if you have a 30-pack-year history of smoking and currently smoke or have quit within the past 15 years.  Fecal occult blood test (FOBT) of the stool. You may have this test every year starting at age 66.  Flexible sigmoidoscopy or colonoscopy. You may have a sigmoidoscopy every 5 years or a colonoscopy every 10 years starting at age 4.  Hepatitis C blood test.  Hepatitis B blood test.  Sexually transmitted disease (STD) testing.  Diabetes screening. This is done by checking your blood sugar (glucose) after you have not eaten for a while (fasting). You may have this done every 1-3 years.  Bone density scan. This is done to screen for osteoporosis. You may have this done starting at age 82.  Mammogram. This may be done every 1-2 years. Talk to your health care provider about how often you should have regular mammograms. Talk with your health care provider about your test results, treatment options, and if necessary, the need for more tests. Vaccines  Your health care provider may recommend certain vaccines, such as:  Influenza vaccine. This is recommended every year.  Tetanus, diphtheria, and acellular pertussis (Tdap, Td) vaccine. You may need a Td booster  every 10 years.  Zoster vaccine. You may need this after age 22.  Pneumococcal 13-valent conjugate (PCV13) vaccine. One dose is recommended after age 14.  Pneumococcal polysaccharide (PPSV23) vaccine. One dose is recommended after age  75. Talk to your health care provider about which screenings and vaccines you need and how often you need them. This information is not intended to replace advice given to you by your health care provider. Make sure you discuss any questions you have with your health care provider. Document Released: 12/22/2015 Document Revised: 08/14/2016 Document Reviewed: 09/26/2015 Elsevier Interactive Patient Education  2017 [place] Prevention in the Home Falls can cause injuries. They can happen to people of all ages. There are many things you can do to make your home safe and to help prevent falls. What can I do on the outside of my home?  Regularly fix the edges of walkways and driveways and fix any cracks.  Remove anything that might make you trip as you walk through a door, such as a raised step or threshold.  Trim any bushes or trees on the path to your home.  Use bright outdoor lighting.  Clear any walking paths of anything that might make someone trip, such as rocks or tools.  Regularly check to see if handrails are loose or broken. Make sure that both sides of any steps have handrails.  Any raised decks and porches should have guardrails on the edges.  Have any leaves, snow, or ice cleared regularly.  Use sand or salt on walking paths during winter.  Clean up any spills in your garage right away. This includes oil or grease spills. What can I do in the bathroom?  Use night lights.  Install grab bars by the toilet and in the tub and shower. Do not use towel bars as grab bars.  Use non-skid mats or decals in the tub or shower.  If you need to sit down in the shower, use a plastic, non-slip stool.  Keep the floor dry. Clean up any water that spills on the floor as soon as it happens.  Remove soap buildup in the tub or shower regularly.  Attach bath mats securely with double-sided non-slip rug tape.  Do not have throw rugs and other things on the floor that can make  you trip. What can I do in the bedroom?  Use night lights.  Make sure that you have a light by your bed that is easy to reach.  Do not use any sheets or blankets that are too big for your bed. They should not hang down onto the floor.  Have a firm chair that has side arms. You can use this for support while you get dressed.  Do not have throw rugs and other things on the floor that can make you trip. What can I do in the kitchen?  Clean up any spills right away.  Avoid walking on wet floors.  Keep items that you use a lot in easy-to-reach places.  If you need to reach something above you, use a strong step stool that has a grab bar.  Keep electrical cords out of the way.  Do not use floor polish or wax that makes floors slippery. If you must use wax, use non-skid floor wax.  Do not have throw rugs and other things on the floor that can make you trip. What can I do with my stairs?  Do not leave any items on the stairs.  Make  sure that there are handrails on both sides of the stairs and use them. Fix handrails that are broken or loose. Make sure that handrails are as long as the stairways.  Check any carpeting to make sure that it is firmly attached to the stairs. Fix any carpet that is loose or worn.  Avoid having throw rugs at the top or bottom of the stairs. If you do have throw rugs, attach them to the floor with carpet tape.  Make sure that you have a light switch at the top of the stairs and the bottom of the stairs. If you do not have them, ask someone to add them for you. What else can I do to help prevent falls?  Wear shoes that:  Do not have high heels.  Have rubber bottoms.  Are comfortable and fit you well.  Are closed at the toe. Do not wear sandals.  If you use a stepladder:  Make sure that it is fully opened. Do not climb a closed stepladder.  Make sure that both sides of the stepladder are locked into place.  Ask someone to hold it for you, if  possible.  Clearly mark and make sure that you can see:  Any grab bars or handrails.  First and last steps.  Where the edge of each step is.  Use tools that help you move around (mobility aids) if they are needed. These include:  Canes.  Walkers.  Scooters.  Crutches.  Turn on the lights when you go into a dark area. Replace any light bulbs as soon as they burn out.  Set up your furniture so you have a clear path. Avoid moving your furniture around.  If any of your floors are uneven, fix them.  If there are any pets around you, be aware of where they are.  Review your medicines with your doctor. Some medicines can make you feel dizzy. This can increase your chance of falling. Ask your doctor what other things that you can do to help prevent falls. This information is not intended to replace advice given to you by your health care provider. Make sure you discuss any questions you have with your health care provider. Document Released: 09/21/2009 Document Revised: 05/02/2016 Document Reviewed: 12/30/2014 Elsevier Interactive Patient Education  2017 [employer].

## 2018-12-14 ENCOUNTER — Ambulatory Visit (INDEPENDENT_AMBULATORY_CARE_PROVIDER_SITE_OTHER): Payer: PPO

## 2018-12-14 ENCOUNTER — Encounter: Payer: Self-pay | Admitting: Podiatry

## 2018-12-14 ENCOUNTER — Other Ambulatory Visit: Payer: Self-pay | Admitting: *Deleted

## 2018-12-14 ENCOUNTER — Ambulatory Visit: Payer: PPO | Admitting: Podiatry

## 2018-12-14 DIAGNOSIS — M2042 Other hammer toe(s) (acquired), left foot: Secondary | ICD-10-CM | POA: Diagnosis not present

## 2018-12-14 DIAGNOSIS — L84 Corns and callosities: Secondary | ICD-10-CM | POA: Diagnosis not present

## 2018-12-14 MED ORDER — LOSARTAN POTASSIUM-HCTZ 100-12.5 MG PO TABS: ORAL_TABLET | ORAL | 0 refills | Status: DC

## 2018-12-14 NOTE — Telephone Encounter (Signed)
[facility] 

## 2018-12-21 NOTE — Progress Notes (Signed)
Subjective:   Patient ID: Frances Holland, female   DOB: 85 y.o.   MRN: [id number]   HPI Patient states overall not hurting but I am concerned about this lesion that continues to form on my second digit left foot even though it is not like it was before surgery   ROS      Objective:  Physical Exam  Neurovascular status was found to be intact with patient found to have lesion second digit left foot that is in a slightly different position than prior to the initial surgery and is not broken down and has mild to minimal discomfort upon palpation     Assessment:  Lesion that is secondary to pressure but it is much better than it was prior to surgery     Plan:  H&P condition reviewed and at this point conservative treatment is recommended.  I did sterile debridement of the lesion there is no iatrogenic bleeding this helped her problem we will continue to use pads and she will be seen back as needed  X-ray indicates there is a deformity of the second digit left foot with pressure against the big toe but satisfactory removal of bone has occurred

## 2018-12-23 ENCOUNTER — Encounter: Payer: Self-pay | Admitting: Internal Medicine

## 2018-12-23 ENCOUNTER — Non-Acute Institutional Stay: Payer: PPO | Admitting: Internal Medicine

## 2018-12-23 VITALS — BP 140/78 | HR 74 | Temp 97.6°F | Ht 64.0 in | Wt 164.2 lb

## 2018-12-23 DIAGNOSIS — E890 Postprocedural hypothyroidism: Secondary | ICD-10-CM

## 2018-12-23 DIAGNOSIS — I10 Essential (primary) hypertension: Secondary | ICD-10-CM

## 2018-12-23 DIAGNOSIS — E782 Mixed hyperlipidemia: Secondary | ICD-10-CM | POA: Diagnosis not present

## 2018-12-23 DIAGNOSIS — R413 Other amnesia: Secondary | ICD-10-CM

## 2018-12-24 ENCOUNTER — Other Ambulatory Visit: Payer: Self-pay | Admitting: *Deleted

## 2018-12-24 MED ORDER — AMLODIPINE BESYLATE 2.5 MG PO TABS: ORAL_TABLET | ORAL | 1 refills | Status: DC

## 2018-12-27 NOTE — Progress Notes (Signed)
Location:  [place] of Service:  Clinic (12)  Provider:   Code Status:  Goals of Care:  Advanced Directives 12/23/2018  Does Patient Have a Medical Advance Directive? Yes  Type of Advance Directive [place]  Does patient want to make changes to medical advance directive? -  Copy of [place] in Chart? Yes - validated most recent copy scanned in chart (See row information)  Pre-existing out of facility DNR order (yellow form or pink MOST form) Yellow form placed in chart (order not valid for inpatient use)     Chief Complaint  Patient presents with  . Medical Management of Chronic Issues    routine 4 month follow up, discuss memory and left toe     HPI: Patient is a 85 y.o. female seen today for medical management of chronic diseases.   Patient has h/o Hypertension, Hypothyroidism, Insomnia and Cognitive impairment Patient was started on Namenda by Neurology but she discontinued it as she and her husband think that it seem to no thad helped. She also discontinued Aricept before due to side effects. She is now taking Prevagen to see if it will help her Memory. She says she has not noticed any problems with her memory but her husband has seen changes with recent deterioration. In the Exam room also patient keeps getting confused and keep repeating herself. She is also very upset about non healing Callus in her Foot. She is seeing Podiatry for this . She is independent in her ADLS right now. Lives with her husband  Past Medical History:  Diagnosis Date  . Back pain   . Cancer [facility])    left breaset mastectomy   . Candidiasis of skin and nails   . Cellulitis and abscess of upper arm and forearm   . Heartburn   . Hemorrhoids 08/20/2016  . History of breast cancer 2000   History left mastectomy  . 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 Hypertension   . Insomnia   . Knee pain, bilateral 2014  . Lymphedema    Left  arm following mastectomy  . Memory impairment   . Memory loss   . Migraine 05/16/2012  . Migraine without aura, without mention of intractable migraine without mention of status migrainosus    patient denies at preop of 08/08/16   . Other and unspecified hyperlipidemia   . Other bursitis disorders   . Other infective bursitis, left shoulder   . Pain in joint, ankle and foot   . Pain in joint, hand   . Pain in joint, lower leg   . Pain in joint, shoulder region   . Pain in joint, shoulder region 04/23/2016  . Pain in limb   . Rectal bleeding 08/20/2016  . Senile osteoporosis   . Symptomatic menopausal or female climacteric states   . Syncope and collapse   . Unspecified hypertensive heart disease without heart failure 1995  . Unspecified hypothyroidism   . Unspecified polyarthropathy or polyarthritis, site unspecified   . Unspecified urinary incontinence   . Unspecified visual loss   . Xerophthalmia     Past Surgical History:  Procedure Laterality Date  . BILATERAL OOPHORECTOMY  1981  . BREAST RECONSTRUCTION Left 01/2000  . BREAST REDUCTION SURGERY Right 01/2000  . CATARACT EXTRACTION Right 09/04/2005  . COLONOSCOPY  01/2004  . MASTECTOMY Left 10/1999  . THYROID LOBECTOMY Left 08/14/2016   Procedure: NEAR TOTAL THYROIDECTOMY;  Surgeon: Johnathan Hausen, MD;  Location: [facility];  Service: General;  Laterality: Left;  . TOTAL ABDOMINAL HYSTERECTOMY W/ BILATERAL SALPINGOOPHORECTOMY  1981  . TOTAL KNEE ARTHROPLASTY Right 2014    Allergies  Allergen Reactions  . Donepezil Hcl     Body pain and stiffness   . Sulfa Antibiotics   . Sulfamethoxazole Rash    Outpatient Encounter Medications as of 12/23/2018  Medication Sig  . Apoaequorin (PREVAGEN PO) Take 1 tablet by mouth every morning.  Marland Kitchen aspirin EC 81 MG tablet Take 81 mg by mouth daily.  . Calcium Carb-Cholecalciferol (CALCIUM 600-D PO) Take 1 tablet by mouth daily.  . Cholecalciferol 5000 units capsule Take 5,000 Units by mouth  daily.  . Ibuprofen-diphenhydrAMINE Cit (ADVIL PM PO) Take 1 Dose by mouth at bedtime.  Marland Kitchen levothyroxine (SYNTHROID, LEVOTHROID) 100 MCG tablet Take 100 mcg by mouth daily before breakfast.  . losartan-hydrochlorothiazide (HYZAAR) 100-12.5 MG tablet Take one tablet by mouth once daily in the morning for blood pressure  . Melatonin 3 MG TABS Take 6 mg by mouth at bedtime.   . Multiple Vitamins-Minerals (CENTRUM SILVER PO) Take 1 tablet by mouth daily.   . naproxen sodium (ALEVE) 220 MG tablet Take 1 to 2 tablet at bedtime as needed for back pain  . Omega-3 Fatty Acids (OMEGA-3 CF PO) Take 520 mg by mouth daily.   Vladimir Faster Glycol-Propyl Glycol (SYSTANE) 0.4-0.3 % SOLN Apply to eye. Use two drops of Systane as needed for dry eyes  . Simethicone (GAS RELIEF 125 MAX ST PO) Take by mouth. Take 1-2 tablets as needed after meals  . [DISCONTINUED] amLODipine (NORVASC) 2.5 MG tablet TAKE 1 TABLET BY MOUTH ONCE DAILY TO LOWER BLOOD PRESSURE  . [DISCONTINUED] memantine (NAMENDA) 5 MG tablet 1 tablet in the morning, 2 in the evening   No facility-administered encounter medications on file as of 12/23/2018.     Review of Systems:  Review of Systems  Health Maintenance  Topic Date Due  . TETANUS/TDAP  10/30/2027  . INFLUENZA VACCINE  Completed  . DEXA SCAN  Completed  . PNA vac Low Risk Adult  Completed    Physical Exam: Vitals:   12/23/18 1506  BP: 140/78  Pulse: 74  Temp: 97.6 F (36.4 C)  TempSrc: Oral  SpO2: 98%  Weight: 164 lb 3.2 oz (74.5 kg)  Height: 5' 4"  (1.626 m)   Body mass index is 28.18 kg/m. Physical Exam  Labs reviewed: Basic Metabolic Panel: Recent Labs    02/02/18 0000 05/25/18 0805  NA 136 134*  K 4.3 4.5  CL 100 99  CO2 26 26  GLUCOSE 99 85  BUN 17 15  CREATININE 0.75 0.72  CALCIUM 9.6 9.6  TSH 0.58  --    Liver Function Tests: Recent Labs    02/02/18 0000 05/25/18 0805  AST 19 18  ALT 16 16  BILITOT 0.5 0.6  PROT 6.8 6.8   No results for  input(s): LIPASE, AMYLASE in the last 8760 hours. No results for input(s): AMMONIA in the last 8760 hours. CBC: Recent Labs    05/25/18 0805  WBC 5.9  HGB 13.6  HCT 38.6  MCV 86.4  PLT 334   Lipid Panel: Recent Labs    02/02/18 0000 05/25/18 0805  CHOL 184 196  HDL 55 52  LDLCALC 107* 116*  TRIG 120 168*  CHOLHDL 3.3 3.8   No results found for: HGBA1C  Procedures since last visit: Dg Foot 2 Views Left  Result Date: 12/14/2018 Please  see detailed radiograph report in office note.   Assessment/Plan 1. Essential hypertension BP controlled  - CBC with Differential/Platelet; Future - CMP with eGFR([facility]); Future - Lipid Panel; Future - TSH; Future   hypothyroidism Continue Synthroid Repeat TSH  Mixed hyperlipidemia LDL high Not on Any statin  Memory loss Had detail talk to Benefit from taking Namenda in long term. He is going to let me know by next visit to reconsider.    Labs/tests ordered: Next appt:  Follow up in 4 months

## 2018-12-28 ENCOUNTER — Other Ambulatory Visit: Payer: PPO

## 2018-12-28 DIAGNOSIS — I10 Essential (primary) hypertension: Secondary | ICD-10-CM

## 2018-12-31 DIAGNOSIS — I10 Essential (primary) hypertension: Secondary | ICD-10-CM | POA: Diagnosis not present

## 2019-01-01 LAB — COMPLETE METABOLIC PANEL WITH GFR
AG Ratio: 1.6 (calc) (ref 1.0–2.5)
ALBUMIN MSPROF: 4.2 g/dL (ref 3.6–5.1)
ALT: 13 U/L (ref 6–29)
AST: 18 U/L (ref 10–35)
Alkaline phosphatase (APISO): 59 U/L (ref 33–130)
BUN: 16 mg/dL (ref 7–25)
CO2: 26 mmol/L (ref 20–32)
CREATININE: 0.72 mg/dL (ref 0.60–0.88)
Calcium: 9.7 mg/dL (ref 8.6–10.4)
Chloride: 98 mmol/L (ref 98–110)
GFR, EST AFRICAN AMERICAN: 87 mL/min/{1.73_m2} (ref >=60)
GFR, Est Non African American: 75 mL/min/{1.73_m2} (ref >=60)
GLUCOSE: 90 mg/dL (ref 65–99)
Globulin: 2.7 g/dL (calc) (ref 1.9–3.7)
Potassium: 4.7 mmol/L (ref 3.5–5.3)
Sodium: 134 mmol/L — ABNORMAL LOW (ref 135–146)
Total Bilirubin: 0.4 mg/dL (ref 0.2–1.2)
Total Protein: 6.9 g/dL (ref 6.1–8.1)

## 2019-01-01 LAB — LIPID PANEL
CHOL/HDL RATIO: 3.5 (calc) (ref <5.0)
Cholesterol: 209 mg/dL — ABNORMAL HIGH (ref <200)
HDL: 60 mg/dL (ref >50)
LDL Cholesterol (Calc): 123 mg/dL (calc) — ABNORMAL HIGH
NON-HDL CHOLESTEROL (CALC): 149 mg/dL — AB (ref <130)
TRIGLYCERIDES: 144 mg/dL (ref <150)

## 2019-01-01 LAB — TSH: TSH: 3.14 m[IU]/L (ref 0.40–4.50)

## 2019-01-04 DIAGNOSIS — D2262 Melanocytic nevi of left upper limb, including shoulder: Secondary | ICD-10-CM | POA: Diagnosis not present

## 2019-01-04 DIAGNOSIS — D2261 Melanocytic nevi of right upper limb, including shoulder: Secondary | ICD-10-CM | POA: Diagnosis not present

## 2019-01-04 DIAGNOSIS — L720 Epidermal cyst: Secondary | ICD-10-CM | POA: Diagnosis not present

## 2019-01-04 DIAGNOSIS — L821 Other seborrheic keratosis: Secondary | ICD-10-CM | POA: Diagnosis not present

## 2019-01-04 DIAGNOSIS — Z85828 Personal history of other malignant neoplasm of skin: Secondary | ICD-10-CM | POA: Diagnosis not present

## 2019-01-06 ENCOUNTER — Encounter: Payer: PPO | Admitting: Internal Medicine

## 2019-01-06 ENCOUNTER — Encounter: Payer: Self-pay | Admitting: Family Medicine

## 2019-02-17 ENCOUNTER — Other Ambulatory Visit: Payer: Self-pay

## 2019-02-17 ENCOUNTER — Encounter: Payer: Self-pay | Admitting: Internal Medicine

## 2019-02-17 ENCOUNTER — Non-Acute Institutional Stay: Payer: PPO | Admitting: Internal Medicine

## 2019-02-17 VITALS — BP 180/108 | HR 93 | Temp 97.6°F | Ht 64.0 in | Wt 165.4 lb

## 2019-02-17 DIAGNOSIS — E782 Mixed hyperlipidemia: Secondary | ICD-10-CM

## 2019-02-17 DIAGNOSIS — L918 Other hypertrophic disorders of the skin: Secondary | ICD-10-CM

## 2019-02-17 DIAGNOSIS — R4189 Other symptoms and signs involving cognitive functions and awareness: Secondary | ICD-10-CM | POA: Diagnosis not present

## 2019-02-17 DIAGNOSIS — E890 Postprocedural hypothyroidism: Secondary | ICD-10-CM | POA: Diagnosis not present

## 2019-02-17 DIAGNOSIS — I10 Essential (primary) hypertension: Secondary | ICD-10-CM

## 2019-02-17 MED ORDER — ATORVASTATIN CALCIUM 10 MG PO TABS: 10.0000 mg | ORAL_TABLET | Freq: Every day | ORAL | 3 refills | Status: DC

## 2019-02-17 NOTE — Progress Notes (Signed)
Location: [place] of Service:  Clinic (12)  Provider:   Code Status:  Goals of Care:  Advanced Directives 02/17/2019  Does Patient Have a Medical Advance Directive? Yes  Type of Advance Directive [place]  Does patient want to make changes to medical advance directive? No - Patient declined  Copy of [place] in Chart? Yes - validated most recent copy scanned in chart (See row information)  Pre-existing out of facility DNR order (yellow form or pink MOST form) Yellow form placed in chart (order not valid for inpatient use)     Chief Complaint  Patient presents with  . Acute Visit    Skin Lesion on right side of neck    HPI: Patient is a 85 y.o. female seen today for an acute visit for The skin lesion on right side of her Neck.  Patient has h/o Hypertension, Hypothyroidism, Insomnia and Cognitive impairment. Also Has h/o Skin Cancer and Thyroid Cancer  She came with her husband and he noticed this skin lesion few weeks ago. Then she says probably it has been there for many months. It is not painful. Her husband was concern for Skin cancer Her BP was also high today. And she says that she is upset as they are going to 1 room apartment and it is giving her anxiety. They also bought some screening test which are done by mobile services to screen.They wanted to know my opinion on it.  Past Medical History:  Diagnosis Date  . Back pain   . Cancer [facility])    left breaset mastectomy   . Candidiasis of skin and nails   . Cellulitis and abscess of upper arm and forearm   . Heartburn   . Hemorrhoids 08/20/2016  . History of breast cancer 2000   History left mastectomy  . 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 Hypertension   . Insomnia   . Knee pain, bilateral 2014  . Lymphedema    Left arm following mastectomy  . Memory impairment   . Memory loss   . Migraine 05/16/2012  . Migraine without aura, without mention  of intractable migraine without mention of status migrainosus    patient denies at preop of 08/08/16   . Other and unspecified hyperlipidemia   . Other bursitis disorders   . Other infective bursitis, left shoulder   . Pain in joint, ankle and foot   . Pain in joint, hand   . Pain in joint, lower leg   . Pain in joint, shoulder region   . Pain in joint, shoulder region 04/23/2016  . Pain in limb   . Rectal bleeding 08/20/2016  . Senile osteoporosis   . Symptomatic menopausal or female climacteric states   . Syncope and collapse   . Unspecified hypertensive heart disease without heart failure 1995  . Unspecified hypothyroidism   . Unspecified polyarthropathy or polyarthritis, site unspecified   . Unspecified urinary incontinence   . Unspecified visual loss   . Xerophthalmia     Past Surgical History:  Procedure Laterality Date  . BILATERAL OOPHORECTOMY  1981  . BREAST RECONSTRUCTION Left 01/2000  . BREAST REDUCTION SURGERY Right 01/2000  . CATARACT EXTRACTION Right 09/04/2005  . COLONOSCOPY  01/2004  . MASTECTOMY Left 10/1999  . THYROID LOBECTOMY Left 08/14/2016   Procedure: NEAR TOTAL THYROIDECTOMY;  Surgeon: Johnathan Hausen, MD;  Location: [facility];  Service: General;  Laterality: Left;  . TOTAL ABDOMINAL HYSTERECTOMY  W/ BILATERAL SALPINGOOPHORECTOMY  1981  . TOTAL KNEE ARTHROPLASTY Right 2014    Allergies  Allergen Reactions  . Donepezil Hcl     Body pain and stiffness   . Sulfa Antibiotics   . Sulfamethoxazole Rash    Outpatient Encounter Medications as of 02/17/2019  Medication Sig  . amLODipine (NORVASC) 2.5 MG tablet Take one tablet by mouth once daily to lower blood pressure  . Apoaequorin (PREVAGEN PO) Take 1 tablet by mouth every morning.  Marland Kitchen aspirin EC 81 MG tablet Take 81 mg by mouth daily.  . Calcium Carb-Cholecalciferol (CALCIUM 600-D PO) Take 1 tablet by mouth daily.  . Cholecalciferol 5000 units capsule Take 5,000 Units by mouth daily.  .  Ibuprofen-diphenhydrAMINE Cit (ADVIL PM PO) Take 1 Dose by mouth at bedtime.  Marland Kitchen levothyroxine (SYNTHROID, LEVOTHROID) 100 MCG tablet Take 100 mcg by mouth daily before breakfast.  . losartan-hydrochlorothiazide (HYZAAR) 100-12.5 MG tablet Take one tablet by mouth once daily in the morning for blood pressure  . Melatonin 3 MG TABS Take 6 mg by mouth at bedtime.   . Multiple Vitamins-Minerals (CENTRUM SILVER PO) Take 1 tablet by mouth daily.   . Omega-3 Fatty Acids (OMEGA-3 CF PO) Take 520 mg by mouth daily.   Vladimir Faster Glycol-Propyl Glycol (SYSTANE) 0.4-0.3 % SOLN Apply to eye. Use two drops of Systane as needed for dry eyes  . Simethicone (GAS RELIEF 125 MAX ST PO) Take by mouth. Take 1-2 tablets as needed after meals  . [DISCONTINUED] naproxen sodium (ALEVE) 220 MG tablet Take 1 to 2 tablet at bedtime as needed for back pain   No facility-administered encounter medications on file as of 02/17/2019.     Review of Systems:  Review of Systems  Constitutional: Negative.   HENT: Negative.   Respiratory: Negative.   Cardiovascular: Negative.   Gastrointestinal: Negative.   Genitourinary: Negative.   Musculoskeletal: Negative.   Skin: Negative.   Neurological: Negative.   Psychiatric/Behavioral: The patient is nervous/anxious.     Health Maintenance  Topic Date Due  . TETANUS/TDAP  10/30/2027  . INFLUENZA VACCINE  Completed  . DEXA SCAN  Completed  . PNA vac Low Risk Adult  Completed    Physical Exam: Vitals:   02/17/19 1511  BP: (!) 180/108  Pulse: 93  Temp: 97.6 F (36.4 C)  SpO2: 95%  Weight: 165 lb 6.4 oz (75 kg)  Height: 5\' 4"  (1.626 m)   Body mass index is 28.39 kg/m. Physical Exam Vitals signs reviewed.  Constitutional:      Appearance: Normal appearance.  HENT:     Head: Normocephalic.     Nose: Nose normal.     Mouth/Throat:     Mouth: Mucous membranes are moist.     Pharynx: Oropharynx is clear.  Eyes:     Pupils: Pupils are equal, round, and reactive  to light.  Neck:     Musculoskeletal: Neck supple.  Cardiovascular:     Rate and Rhythm: Normal rate and regular rhythm.     Pulses: Normal pulses.     Heart sounds: Normal heart sounds.  Pulmonary:     Effort: Pulmonary effort is normal.     Breath sounds: Normal breath sounds.  Musculoskeletal:        General: No swelling.  Skin:    Comments: Has Skin Tag near her Right Clavicle  Neurological:     General: No focal deficit present.     Mental Status: She is alert.  Psychiatric:  Mood and Affect: Mood is anxious.        Thought Content: Thought content normal.     Labs reviewed: Basic Metabolic Panel: Recent Labs    05/25/18 0805 12/31/18 0800  NA 134* 134*  K 4.5 4.7  CL 99 98  CO2 26 26  GLUCOSE 85 90  BUN 15 16  CREATININE 0.72 0.72  CALCIUM 9.6 9.7  TSH  --  3.14   Liver Function Tests: Recent Labs    05/25/18 0805 12/31/18 0800  AST 18 18  ALT 16 13  BILITOT 0.6 0.4  PROT 6.8 6.9   No results for input(s): LIPASE, AMYLASE in the last 8760 hours. No results for input(s): AMMONIA in the last 8760 hours. CBC: Recent Labs    05/25/18 0805  WBC 5.9  HGB 13.6  HCT 38.6  MCV 86.4  PLT 334   Lipid Panel: Recent Labs    05/25/18 0805 12/31/18 0800  CHOL 196 209*  HDL 52 60  LDLCALC 116* 123*  TRIG 168* 144  CHOLHDL 3.8 3.5   No results found for: HGBA1C  Procedures since last visit: No results found.  Assessment/Plan Essential hypertension Repeat BP was 180/90 She said that she is anxious due to  Move.to new Apartment I have told them to check her BP 3/WEek for few weeks and call pur office if it is more then 150/90  Skin tag - He husband was concerned and wanted to see Dr Wilhemina [facility]  Plan: Ambulatory referral to Dermatology  Mixed hyperlipidemia D/w the patient to reduce her chance to reduce PVD and Vascular disease is to treat her elevated LDL She has agreed to start Lipitor 10 mg    Hypothyroidism Continue  Synthroid TSH normal in 01/20  Cognitive impairment Has appointment with Dr Jannifer Franklin They have discontinued both Aricept and Namenda Will Check MMSE in next Appointment     Labs/tests ordered:  @ORDERS @ Next appt:  04/19/2019 Total time spent in this patient care encounter was 40_ minutes; greater than 50% of the visit spent counseling patient, reviewing records , Labs and coordinating care for problems addressed at this encounter.

## 2019-02-24 ENCOUNTER — Telehealth: Payer: Self-pay | Admitting: *Deleted

## 2019-02-24 NOTE — Telephone Encounter (Signed)
Patient husband, [name] called and stated that patient's blood pressure has been high.  02/19/19  9:50am 143/86 pulse 69 02/20/19  4pm  210/108  AL took 196/101 02/23/19  7:50pm 199/95 8:05pm 190/99 02/24/19  12:30pm 203/103 (Robin,nurse)  (only one pulse taken)  Husband stated that Rebbeca Paul, [facility] Nurse suggested: 1. Taking Melatonin 3mg  at bedtime and if not effective take 5mg  and NOT to take Advil PM instead take Tylenol PM  2.Wants to know if patient is a candidate for a Antidepressant and Therapy due to stressors in life with moving to smaller apartment.   Also Questions: 3. Should patient continue taking Aspirin 81mg   Patient STOPPED taking Lipitor (stated she took it the 12th and 13th) because she felt it was causing HBP  Also STOPPED taking the Naproxen because she thought it affected her sleep.   Please Advise.

## 2019-02-25 DIAGNOSIS — D485 Neoplasm of uncertain behavior of skin: Secondary | ICD-10-CM | POA: Diagnosis not present

## 2019-02-25 DIAGNOSIS — L820 Inflamed seborrheic keratosis: Secondary | ICD-10-CM | POA: Diagnosis not present

## 2019-02-25 DIAGNOSIS — Z85828 Personal history of other malignant neoplasm of skin: Secondary | ICD-10-CM | POA: Diagnosis not present

## 2019-02-26 ENCOUNTER — Telehealth: Payer: Self-pay | Admitting: *Deleted

## 2019-02-26 ENCOUNTER — Ambulatory Visit (INDEPENDENT_AMBULATORY_CARE_PROVIDER_SITE_OTHER): Payer: PPO | Admitting: Family

## 2019-02-26 ENCOUNTER — Encounter: Payer: Self-pay | Admitting: Family

## 2019-02-26 ENCOUNTER — Other Ambulatory Visit: Payer: Self-pay

## 2019-02-26 VITALS — BP 158/90 | HR 68 | Temp 97.4°F | Ht 64.0 in | Wt 162.8 lb

## 2019-02-26 DIAGNOSIS — I10 Essential (primary) hypertension: Secondary | ICD-10-CM

## 2019-02-26 DIAGNOSIS — F321 Major depressive disorder, single episode, moderate: Secondary | ICD-10-CM

## 2019-02-26 DIAGNOSIS — F411 Generalized anxiety disorder: Secondary | ICD-10-CM | POA: Diagnosis not present

## 2019-02-26 MED ORDER — SERTRALINE HCL 25 MG PO TABS: 50.0000 mg | ORAL_TABLET | Freq: Every day | ORAL | 3 refills | Status: DC

## 2019-02-26 MED ORDER — AMLODIPINE BESYLATE 5 MG PO TABS: ORAL_TABLET | ORAL | 3 refills | Status: DC

## 2019-02-26 MED ORDER — QUETIAPINE FUMARATE 25 MG PO TABS: 25.0000 mg | ORAL_TABLET | Freq: Every day | ORAL | 3 refills | Status: DC

## 2019-02-26 NOTE — Patient Instructions (Signed)
1. Increase Amlodipine to 5 mg tablet one by mouth daily 2. Notified provider's office if blood pressure readings are > 160/90 for 3 consecutive times or < 90/60  3. Follow up in 4 weeks for Depression/Anxiety evaluation   Generalized Anxiety Disorder, Adult Generalized anxiety disorder (GAD) is a mental health disorder. People with this condition constantly worry about everyday events. Unlike normal anxiety, worry related to GAD is not triggered by a specific event. These worries also do not fade or get better with time. GAD interferes with life functions, including relationships, work, and school. GAD can vary from mild to severe. People with severe GAD can have intense waves of anxiety with physical symptoms (panic attacks). What are the causes? The exact cause of GAD is not known. What increases the risk? This condition is more likely to develop in:  Women.  People who have a family history of anxiety disorders.  People who are very shy.  People who experience very stressful life events, such as the death of a loved one.  People who have a very stressful family environment. What are the signs or symptoms? People with GAD often worry excessively about many things in their lives, such as their health and family. They may also be overly concerned about:  Doing well at work.  Being on time.  Natural disasters.  Friendships. Physical symptoms of GAD include:  Fatigue.  Muscle tension or having muscle twitches.  Trembling or feeling shaky.  Being easily startled.  Feeling like your heart is pounding or racing.  Feeling out of breath or like you cannot take a deep breath.  Having trouble falling asleep or staying asleep.  Sweating.  Nausea, diarrhea, or irritable bowel syndrome (IBS).  Headaches.  Trouble concentrating or remembering facts.  Restlessness.  Irritability. How is this diagnosed? Your health care provider can diagnose GAD based on your symptoms and  medical history. You will also have a physical exam. The health care provider will ask specific questions about your symptoms, including how severe they are, when they started, and if they come and go. Your health care provider may ask you about your use of alcohol or drugs, including prescription medicines. Your health care provider may refer you to a mental health specialist for further evaluation. Your health care provider will do a thorough examination and may perform additional tests to rule out other possible causes of your symptoms. To be diagnosed with GAD, a person must have anxiety that:  Is out of his or her control.  Affects several different aspects of his or her life, such as work and relationships.  Causes distress that makes him or her unable to take part in normal activities.  Includes at least three physical symptoms of GAD, such as restlessness, fatigue, trouble concentrating, irritability, muscle tension, or sleep problems. Before your health care provider can confirm a diagnosis of GAD, these symptoms must be present more days than they are not, and they must last for six months or longer. How is this treated? The following therapies are usually used to treat GAD:  Medicine. Antidepressant medicine is usually prescribed for long-term daily control. Antianxiety medicines may be added in severe cases, especially when panic attacks occur.  Talk therapy (psychotherapy). Certain types of talk therapy can be helpful in treating GAD by providing support, education, and guidance. Options include: ? Cognitive behavioral therapy (CBT). People learn coping skills and techniques to ease their anxiety. They learn to identify unrealistic or negative thoughts and behaviors and  to replace them with positive ones. ? Acceptance and commitment therapy (ACT). This treatment teaches people how to be mindful as a way to cope with unwanted thoughts and feelings. ? Biofeedback. This process trains  you to manage your body's response (physiological response) through breathing techniques and relaxation methods. You will work with a therapist while machines are used to monitor your physical symptoms.  Stress management techniques. These include yoga, meditation, and exercise. A mental health specialist can help determine which treatment is best for you. Some people see improvement with one type of therapy. However, other people require a combination of therapies. Follow these instructions at home:  Take over-the-counter and prescription medicines only as told by your health care provider.  Try to maintain a normal routine.  Try to anticipate stressful situations and allow extra time to manage them.  Practice any stress management or self-calming techniques as taught by your health care provider.  Do not punish yourself for setbacks or for not making progress.  Try to recognize your accomplishments, even if they are small.  Keep all follow-up visits as told by your health care provider. This is important. Contact a health care provider if:  Your symptoms do not get better.  Your symptoms get worse.  You have signs of depression, such as: ? A persistently sad, cranky, or irritable mood. ? Loss of enjoyment in activities that used to bring you joy. ? Change in weight or eating. ? Changes in sleeping habits. ? Avoiding friends or family members. ? Loss of energy for normal tasks. ? Feelings of guilt or worthlessness. Get help right away if:  You have serious thoughts about hurting yourself or others. If you ever feel like you may hurt yourself or others, or have thoughts about taking your own life, get help right away. You can go to your nearest emergency department or call:  Your local emergency services (911 in the [place]  A suicide crisis helpline, such as the [facility] at 806-315-9697. This is open 24 hours a day. Summary  Generalized anxiety  disorder (GAD) is a mental health disorder that involves worry that is not triggered by a specific event.  People with GAD often worry excessively about many things in their lives, such as their health and family.  GAD may cause physical symptoms such as restlessness, trouble concentrating, sleep problems, frequent sweating, nausea, diarrhea, headaches, and trembling or muscle twitching.  A mental health specialist can help determine which treatment is best for you. Some people see improvement with one type of therapy. However, other people require a combination of therapies. This information is not intended to replace advice given to you by your health care provider. Make sure you discuss any questions you have with your health care provider. Document Released: 03/22/2013 Document Revised: 10/15/2016 Document Reviewed: 10/15/2016 Elsevier Interactive Patient Education  2019 [employer].

## 2019-02-26 NOTE — Telephone Encounter (Signed)
Patient husband, [name] called and stated that patient was seen today. Stated that the pharmacy gave them 3 rx's:  -Amlodipine 5mg  -Sertraline 25mg  -Quetiapine 25mg   Patient husband stated that this is not right and they will not start taking this medication until he speaks with you directly.   I tried to explain to him that your note stated to take Amlodipine and Sertraline but he refuses to listen to me. Stated he will not start her on these medications till he hears back from you.   Please Advise.

## 2019-02-26 NOTE — Telephone Encounter (Signed)
RX : 1.Sertraline 25 mg tablet one by mouth daily at bedtime  2. Amlodipine 5 mg tablet one by mouth daily

## 2019-02-26 NOTE — Telephone Encounter (Signed)
Phone call returned spoke with patient's husband states has read the side effects of sertraline and would not recommend it for her to take for anxiety or depression.

## 2019-02-26 NOTE — Telephone Encounter (Signed)
Spoke with Frances Holland and patient will see [name] today at 1030 for her blood pressure

## 2019-02-26 NOTE — Telephone Encounter (Signed)
He stated his paperwork says something different and he wants to speak with you directly before he will give his wife any medications.

## 2019-02-26 NOTE — Progress Notes (Signed)
Provider: Abram Sax FNP-C  Frances Dad, MD  Patient Care Team: Frances Dad, MD as PCP - General (Internal Medicine) Frances Bowen, MD as Consulting Physician (Endocrinology)  Extended Emergency Contact Information Primary Emergency Contact: [facility] Address: [address], [address]          [place], [place] [address] Frances Holland of [place] Phone: [phone] Mobile Phone: [phone] Relation: Spouse Secondary Emergency Contact: Holland,Frances Address: [address]          [place], [place] [address] Frances Holland of [place] Phone: [phone] Work Phone: [phone] Mobile Phone: [phone] Relation: Son  Goals of care: Advanced Directive information Advanced Directives 02/26/2019  Does Patient Have a Medical Advance Directive? Yes  Type of Advance Directive [place]  Does patient want to make changes to medical advance directive? No - Patient declined  Copy of [place] in Chart? Yes - validated most recent copy scanned in chart (See row information)  Pre-existing out of facility DNR order (yellow form or pink MOST form) -     Chief Complaint  Patient presents with  . Acute Visit    Patient c/o of high blood pressure readings. Last check on wednesday 203/103 by Frances Holland at [facility]. Patient husband states patient has been experiencing high blood pressure for over a week now.   . other    Patient states experience with anxiety from moving and concerns with daughter's health  . Medication Management    Patient would like to discuss taking asa 81 mg, melatonin, lipitor, and advil pm. Was advised by Frances Holland to no longer take advil pm and melatonin.      HPI:  Pt is a 85 y.o. female seen today for an acute visit for evaluation of she is lives  Independent Living apartment at [facility].  Hypertension - blood pressure readings ranging in the 140's/80's-210/108.Blood pressure checking by facility  Nurse.she denies any headache,dizziness,faintness or chest pain.she states has had increased anxiety since had to move from a two bedroom apartment to one bedroom .Inquires whether taking ASA late morning could be making blood pressure go up.   Anxiety/Depression - increased anxiety due to adjustment to one bedroom apartment.Also feeling due to her daughter undergoing chemotherapy due to breast cancer.Anxiety keeping her awake at night.   Hyperlipidemia - states stopped taking her Lipitor thought it was making her blood pressure go up.recommended to keep taking Lipitor.   Past Medical History:  Diagnosis Date  . Back pain   . Cancer [facility])    left breaset mastectomy   . Candidiasis of skin and nails   . Cellulitis and abscess of upper arm and forearm   . Heartburn   . Hemorrhoids 08/20/2016  . History of breast cancer 2000   History left mastectomy  . 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 Hypertension   . Insomnia   . Knee pain, bilateral 2014  . Lymphedema    Left arm following mastectomy  . Memory impairment   . Memory loss   . Migraine 05/16/2012  . Migraine without aura, without mention of intractable migraine without mention of status migrainosus    patient denies at preop of 08/08/16   . Other and unspecified hyperlipidemia   . Other bursitis disorders   . Other infective bursitis, left shoulder   . Pain in joint, ankle and foot   . Pain in joint, hand   . Pain in joint, lower leg   .  Pain in joint, shoulder region   . Pain in joint, shoulder region 04/23/2016  . Pain in limb   . Rectal bleeding 08/20/2016  . Senile osteoporosis   . Symptomatic menopausal or female climacteric states   . Syncope and collapse   . Unspecified hypertensive heart disease without heart failure 1995  . Unspecified hypothyroidism   . Unspecified polyarthropathy or polyarthritis, site unspecified   . Unspecified urinary incontinence   . Unspecified visual loss   . Xerophthalmia    Past  Surgical History:  Procedure Laterality Date  . BILATERAL OOPHORECTOMY  1981  . BREAST RECONSTRUCTION Left 01/2000  . BREAST REDUCTION SURGERY Right 01/2000  . CATARACT EXTRACTION Right 09/04/2005  . COLONOSCOPY  01/2004  . MASTECTOMY Left 10/1999  . THYROID LOBECTOMY Left 08/14/2016   Procedure: NEAR TOTAL THYROIDECTOMY;  Surgeon: Johnathan Hausen, MD;  Location: [facility]S;  Service: General;  Laterality: Left;  . TOTAL ABDOMINAL HYSTERECTOMY W/ BILATERAL SALPINGOOPHORECTOMY  1981  . TOTAL KNEE ARTHROPLASTY Right 2014    Allergies  Allergen Reactions  . Donepezil Hcl     Body pain and stiffness   . Sulfa Antibiotics   . Sulfamethoxazole Rash    Outpatient Encounter Medications as of 02/26/2019  Medication Sig  . amLODipine (NORVASC) 2.5 MG tablet Take one tablet by mouth once daily to lower blood pressure  . Apoaequorin (PREVAGEN PO) Take 1 tablet by mouth every morning.  Marland Kitchen aspirin EC 81 MG tablet Take 81 mg by mouth daily.  . Calcium Carb-Cholecalciferol (CALCIUM 600-D PO) Take 1 tablet by mouth daily.  . Cholecalciferol 5000 units capsule Take 5,000 Units by mouth daily.  . Ibuprofen-diphenhydrAMINE Cit (ADVIL PM PO) Take 1 Dose by mouth at bedtime.  Marland Kitchen levothyroxine (SYNTHROID, LEVOTHROID) 112 MCG tablet Take 112 mcg by mouth daily before breakfast.  . losartan-hydrochlorothiazide (HYZAAR) 100-12.5 MG tablet Take one tablet by mouth once daily in the morning for blood pressure  . Melatonin 3 MG TABS Take 3 mg by mouth at bedtime.   . Multiple Vitamins-Minerals (CENTRUM SILVER PO) Take 1 tablet by mouth daily.   . Omega-3 Fatty Acids (OMEGA-3 CF PO) Take 520 mg by mouth daily.   Vladimir Faster Glycol-Propyl Glycol (SYSTANE) 0.4-0.3 % SOLN Apply to eye. Use two drops of Systane as needed for dry eyes  . Simethicone (GAS RELIEF 125 MAX ST PO) Take by mouth. Take 1-2 tablets as needed after meals  . [DISCONTINUED] atorvastatin (LIPITOR) 10 MG tablet Take 1 tablet (10 mg total) by mouth daily.   . [DISCONTINUED] levothyroxine (SYNTHROID, LEVOTHROID) 100 MCG tablet Take 100 mcg by mouth daily before breakfast.   No facility-administered encounter medications on file as of 02/26/2019.     Review of Systems  Constitutional: Negative for appetite change, chills, fatigue and fever.  HENT: Negative for congestion, postnasal drip, rhinorrhea, sinus pressure, sinus pain, sneezing and sore throat.   Eyes: Positive for visual disturbance. Negative for pain, discharge, redness and itching.       Wears eye glasses   Respiratory: Negative for cough, chest tightness, shortness of breath and wheezing.   Cardiovascular: Negative for chest pain, palpitations and leg swelling.  Gastrointestinal: Negative for abdominal distention, abdominal pain, constipation, diarrhea, nausea and vomiting.  Genitourinary: Negative for difficulty urinating, dysuria, flank pain, frequency and urgency.  Musculoskeletal: Positive for gait problem. Negative for arthralgias.  Skin: Negative for color change, pallor and rash.  Neurological: Negative for dizziness, light-headedness and headaches.  Psychiatric/Behavioral: Negative for agitation, confusion  and suicidal ideas. The patient is nervous/anxious.     Immunization History  Administered Date(s) Administered  . Influenza, High Dose Seasonal PF 09/17/2017  . Influenza,inj,Quad PF,6+ Mos 09/10/2018  . Influenza-Unspecified 08/10/2013, 09/07/2015, 09/13/2016  . Pneumococcal Conjugate-13 06/08/2017, 11/02/2018  . Pneumococcal Polysaccharide-23 12/09/2002, 10/22/2017  . Tdap 02/21/2014, 10/29/2017  . Zoster Recombinat (Shingrix) 07/18/2017, 01/10/2018   Pertinent  Health Maintenance Due  Topic Date Due  . INFLUENZA VACCINE  Completed  . DEXA SCAN  Completed  . PNA vac Low Risk Adult  Completed   Fall Risk  02/26/2019 02/17/2019 12/23/2018 10/30/2018 10/10/2017  Falls in the past year? 0 0 1 0 No  Number falls in past yr: 0 0 0 0 -  Injury with Fall? 0 0 0 0 -     Vitals:   02/26/19 1032  BP: (!) 158/90  Pulse: 68  Temp: (!) 97.4 F (36.3 C)  TempSrc: Oral  SpO2: 98%  Weight: 162 lb 12.8 oz (73.8 kg)  Height: 5\' 4"  (1.626 m)   Body mass index is 27.94 kg/m. Physical Exam Vitals signs reviewed.  Constitutional:      General: She is not in acute distress.    Appearance: She is overweight. She is not ill-appearing.  HENT:     Head: Normocephalic.     Right Ear: Tympanic membrane, ear canal and external ear normal. There is no impacted cerumen.     Left Ear: Tympanic membrane, ear canal and external ear normal. There is no impacted cerumen.     Nose: Nose normal. No congestion or rhinorrhea.     Mouth/Throat:     Mouth: Mucous membranes are moist.     Pharynx: Oropharynx is clear. No oropharyngeal exudate or posterior oropharyngeal erythema.  Eyes:     General: No scleral icterus.       Right eye: No discharge.        Left eye: No discharge.     Conjunctiva/sclera: Conjunctivae normal.     Pupils: Pupils are equal, round, and reactive to light.  Neck:     Musculoskeletal: Normal range of motion. No neck rigidity or muscular tenderness.     Vascular: No carotid bruit.  Cardiovascular:     Rate and Rhythm: Normal rate and regular rhythm.     Pulses: Normal pulses.     Heart sounds: Normal heart sounds. No murmur. No friction rub. No gallop.   Pulmonary:     Effort: Pulmonary effort is normal. No respiratory distress.     Breath sounds: Normal breath sounds. No wheezing, rhonchi or rales.  Chest:     Chest wall: No tenderness.  Abdominal:     General: Bowel sounds are normal. There is no distension.     Palpations: Abdomen is soft. There is no mass.     Tenderness: There is no abdominal tenderness. There is no right CVA tenderness, left CVA tenderness, guarding or rebound.  Musculoskeletal:        General: No swelling or tenderness.     Right lower leg: No edema.     Left lower leg: No edema.     Comments: Unsteady gait    Lymphadenopathy:     Cervical: No cervical adenopathy.  Skin:    General: Skin is warm and dry.     Coloration: Skin is not pale.     Findings: No erythema or rash.  Neurological:     Mental Status: She is alert and oriented to person, place, and time.  Cranial Nerves: No cranial nerve deficit.     Sensory: No sensory deficit.     Motor: No weakness.     Coordination: Coordination normal.     Gait: Gait abnormal.  Psychiatric:        Mood and Affect: Mood is anxious and depressed.        Speech: Speech normal.        Behavior: Behavior normal.        Thought Content: Thought content normal.        Judgment: Judgment normal.    Labs reviewed: Recent Labs    05/25/18 0805 12/31/18 0800  NA 134* 134*  K 4.5 4.7  CL 99 98  CO2 26 26  GLUCOSE 85 90  BUN 15 16  CREATININE 0.72 0.72  CALCIUM 9.6 9.7   Recent Labs    05/25/18 0805 12/31/18 0800  AST 18 18  ALT 16 13  BILITOT 0.6 0.4  PROT 6.8 6.9   Recent Labs    05/25/18 0805  WBC 5.9  HGB 13.6  HCT 38.6  MCV 86.4  PLT 334   Lab Results  Component Value Date   TSH 3.14 12/31/2018   No results found for: HGBA1C Lab Results  Component Value Date   CHOL 209 (H) 12/31/2018   HDL 60 12/31/2018   LDLCALC 123 (H) 12/31/2018   TRIG 144 12/31/2018   CHOLHDL 3.5 12/31/2018    Significant Diagnostic Results in last 30 days:  No results found.  Assessment/Plan  1. Essential hypertension Blood pressure log reviewed readings 140's/80's-210/108. - Increase Amlodipine from 2.5 mg tablet to 5 mg tablet once daily. - Notified provider's office if blood pressure readings are > 160/90 for 3 consecutive times or < 90/60   2. Generalized anxiety disorder Symptoms worst due to moving adjustment and daughter's health condition. - start on sertraline 25 mg tablet one by mouth once daily at bedtime. - Follow up in 4 weeks for evaluation.   3. Current moderate episode of major depressive disorder, unspecified  whether recurrent (HCC)  sertraline 25 mg tablet one by mouth once daily at bedtime as above.will adjust dose if symptoms still persist.  Family/ staff Communication: Reviewed plan of care with patient and Husband.  Labs/tests ordered: None   Follow up : 4 weeks for  Anxiety and depression evaluation.   Frances Hughs, Frances Holland

## 2019-03-01 ENCOUNTER — Telehealth: Payer: Self-pay | Admitting: *Deleted

## 2019-03-01 MED ORDER — BUSPIRONE HCL 7.5 MG PO TABS: 7.5000 mg | ORAL_TABLET | Freq: Two times a day (BID) | ORAL | 1 refills | Status: DC

## 2019-03-01 NOTE — Telephone Encounter (Signed)
1. Did patient increase her Amlodipine from 2.5 to 5 mg tablet daily as directed?  2. May consider taking Sertraline 12.5 mg tablet daily or start on Buspar 7.5 mg tablet one by mouth twice daily for anxiety.

## 2019-03-01 NOTE — Telephone Encounter (Signed)
Patient is taking Amlodipine 5mg . Husband thinks patient has some anxiety due to daughter having to start back on Chemo due to Ovarian Cancer and from moving to a smaller apartment.   Husband is interested in patient taking the Buspar 7.5mg  and want it sent to the pharmacy. Pended Rx.

## 2019-03-01 NOTE — Telephone Encounter (Signed)
Frances Holland, husband called with patient's blood pressure Reading: 02/26/19 10:45am  158/90     8:40pm  164/87 02/27/19   6:45am  150/98  Pulse 71     9:00pm  183/87 02/28/19   6:50am  186/85  Pulse 67 03/01/19   7:45am  173/89  Pulse 66  Husband stated that Sunday morning and this morning patient did her bed exercises right before her blood pressure was taken. Stated that patient DID NOT take the Sertraline or Quinapine.   Husband wants to know if a Less Powerful AntiAnxiety medication can be prescribed. Please Advise.

## 2019-03-01 NOTE — Telephone Encounter (Signed)
Okay.Buspar e-scribed to patient's pharmacy.

## 2019-03-02 NOTE — Telephone Encounter (Signed)
Call and spoke with [name] patient husband and he is aware Rx Buspar was sent to the pharmacy and has picked the prescription up

## 2019-03-03 ENCOUNTER — Telehealth: Payer: Self-pay | Admitting: *Deleted

## 2019-03-03 DIAGNOSIS — E7849 Other hyperlipidemia: Secondary | ICD-10-CM | POA: Diagnosis not present

## 2019-03-03 DIAGNOSIS — F418 Other specified anxiety disorders: Secondary | ICD-10-CM | POA: Diagnosis not present

## 2019-03-03 DIAGNOSIS — C50919 Malignant neoplasm of unspecified site of unspecified female breast: Secondary | ICD-10-CM | POA: Diagnosis not present

## 2019-03-03 DIAGNOSIS — E890 Postprocedural hypothyroidism: Secondary | ICD-10-CM | POA: Diagnosis not present

## 2019-03-03 DIAGNOSIS — C73 Malignant neoplasm of thyroid gland: Secondary | ICD-10-CM | POA: Diagnosis not present

## 2019-03-03 DIAGNOSIS — I10 Essential (primary) hypertension: Secondary | ICD-10-CM | POA: Diagnosis not present

## 2019-03-03 DIAGNOSIS — M810 Age-related osteoporosis without current pathological fracture: Secondary | ICD-10-CM | POA: Diagnosis not present

## 2019-03-03 DIAGNOSIS — G3184 Mild cognitive impairment, so stated: Secondary | ICD-10-CM | POA: Diagnosis not present

## 2019-03-03 NOTE — Telephone Encounter (Signed)
Patient husband notified and agreed.

## 2019-03-03 NOTE — Telephone Encounter (Signed)
1. Blood pressure now within normal range continue with amlodipine 5 mg tablet daily  2. Recommend low carbohydrate,low saturated fat and high vegetable diet.may also consider taking fish oil capsule  if desired.

## 2019-03-03 NOTE — Telephone Encounter (Signed)
Patient husband, Frances Holland called and stated that he was calling just to let Frances Holland and Frances Holland know about patient's appointment this morning with Frances Holland. Stated that she was there for a 6 month follow up for her thyroid. No changes. No labs done.  Also talked about blood pressure and 11:25am BP 126/72 taken by nurse. Alittle while later taken by Frances Holland and it was 128/78.  Also talked about the Lipitor. Patient is convienced that it is causing her not to sleep well at night. Frances Holland told them that that is a rare side effect from Lipitor. Frances Holland and husband has decided NOT to take the Lipitor anymore and stated that Frances Holland agreed.

## 2019-03-08 ENCOUNTER — Other Ambulatory Visit: Payer: Self-pay | Admitting: Family

## 2019-03-17 ENCOUNTER — Encounter: Payer: PPO | Admitting: Internal Medicine

## 2019-03-24 ENCOUNTER — Non-Acute Institutional Stay: Payer: PPO | Admitting: Internal Medicine

## 2019-03-24 ENCOUNTER — Other Ambulatory Visit: Payer: Self-pay

## 2019-03-24 ENCOUNTER — Encounter: Payer: PPO | Admitting: Internal Medicine

## 2019-03-24 ENCOUNTER — Encounter: Payer: Self-pay | Admitting: Internal Medicine

## 2019-03-24 VITALS — BP 158/96 | HR 69 | Temp 97.7°F | Ht 64.0 in | Wt 164.4 lb

## 2019-03-24 DIAGNOSIS — R32 Unspecified urinary incontinence: Secondary | ICD-10-CM

## 2019-03-24 DIAGNOSIS — R4189 Other symptoms and signs involving cognitive functions and awareness: Secondary | ICD-10-CM

## 2019-03-24 DIAGNOSIS — E782 Mixed hyperlipidemia: Secondary | ICD-10-CM | POA: Diagnosis not present

## 2019-03-24 DIAGNOSIS — E890 Postprocedural hypothyroidism: Secondary | ICD-10-CM | POA: Diagnosis not present

## 2019-03-24 DIAGNOSIS — I10 Essential (primary) hypertension: Secondary | ICD-10-CM | POA: Diagnosis not present

## 2019-03-24 MED ORDER — AMLODIPINE BESYLATE 2.5 MG PO TABS: 2.5000 mg | ORAL_TABLET | Freq: Every day | ORAL | 0 refills | Status: DC

## 2019-03-24 MED ORDER — AMLODIPINE BESYLATE 5 MG PO TABS: 5.0000 mg | ORAL_TABLET | Freq: Every day | ORAL | 0 refills | Status: DC

## 2019-03-24 NOTE — Progress Notes (Signed)
Location:  [facility] [phone]) Provider:    Virgie Dad, MD  Patient Care Team: Virgie Dad, MD as PCP - General (Internal Medicine) Reynold Bowen, MD as Consulting Physician (Endocrinology)  Extended Emergency Contact Information Primary Emergency Contact: Ceballos,Gordon Address: [address]          [place], [place] [address] [name] of [place] Phone: [phone] Mobile Phone: [phone] Relation: Spouse Secondary Emergency Contact: [name],[name] Address: [address]          [place], [place] [address] [name] of [place] Phone: [phone] Work Phone: [phone] Mobile Phone: [phone] Relation: Son  Code Status:  Goals of care: Advanced Directive information Advanced Directives 03/24/2019  Does Patient Have a Medical Advance Directive? Yes  Type of Paramedic of [place];Out of facility DNR (pink MOST or yellow form)  Does patient want to make changes to medical advance directive? No - Patient declined  Copy of [place] in Chart? -  Pre-existing out of facility DNR order (yellow form or pink MOST form) Pink MOST form placed in chart (order not valid for inpatient use)     Chief Complaint  Patient presents with   Medical Management of Chronic Issues    1 month follow up, bladder discomfort     HPI:  Pt is a 85 y.o. female seen today for an acute visit for Follow up of her Hypertension  Patient has h/o Hypertension, Hypothyroidism, Insomnia and Cognitive impairment. Also Has h/o Skin Cancer and Thyroid Cancer Patient is here with her husband. She c/o urinary incontinence but said it has been going on for long time and she can wear Pads to help her.  She did have her lesion removed by her dermatology and it was Keratosis. She also was seen in our office for Hypertension and her Norvasc was increase to 5 mg.  She was also started on  Buspar but she is not taking that . She also not taking Lipitor as her husband thinks it hinders her Sleep. They have moved to the new apartment and she is more Calmer now. For her dementia she has discontinued her Aricept and Namenda due to concern for side effects She is independent in her ADLS right now. 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   Hypertension    Insomnia    Knee pain, bilateral 2014   Lymphedema    Left arm following mastectomy   Memory impairment    Memory loss    Migraine 05/16/2012   Migraine without aura, without mention of intractable migraine without mention of status migrainosus    patient denies at preop of 08/08/16    Other and unspecified hyperlipidemia    Other bursitis disorders    Other infective bursitis, left shoulder    Pain in joint, ankle and foot    Pain in joint, hand    Pain in joint, lower leg    Pain in joint, shoulder region    Pain in joint, shoulder region 04/23/2016   Pain  in limb    Rectal bleeding 08/20/2016   Senile osteoporosis    Symptomatic menopausal or female climacteric states    Syncope and collapse    Unspecified hypertensive heart disease without heart failure 1995   Unspecified hypothyroidism    Unspecified polyarthropathy or polyarthritis, site unspecified    Unspecified urinary incontinence    Unspecified visual loss    Xerophthalmia    Past Surgical History:  Procedure Laterality Date   BILATERAL OOPHORECTOMY  1981   BREAST RECONSTRUCTION Left 01/2000   BREAST REDUCTION SURGERY Right 01/2000   CATARACT EXTRACTION Right 09/04/2005   COLONOSCOPY  01/2004   MASTECTOMY  Left 10/1999   THYROID LOBECTOMY Left 08/14/2016   Procedure: NEAR TOTAL THYROIDECTOMY;  Surgeon: Johnathan Hausen, MD;  Location: [facility];  Service: General;  Laterality: Left;   TOTAL ABDOMINAL HYSTERECTOMY W/ BILATERAL SALPINGOOPHORECTOMY  1981   TOTAL KNEE ARTHROPLASTY Right 2014    Allergies  Allergen Reactions   Donepezil Hcl     Body pain and stiffness    Sulfa Antibiotics    Sulfamethoxazole Rash    Outpatient Encounter Medications as of 03/24/2019  Medication Sig   Apoaequorin (PREVAGEN PO) Take 1 tablet by mouth every morning.   aspirin EC 81 MG tablet Take 81 mg by mouth daily.   Calcium Carb-Cholecalciferol (CALCIUM 600-D PO) Take 1 tablet by mouth daily.   Cholecalciferol 5000 units capsule Take 5,000 Units by mouth daily.   levothyroxine (SYNTHROID, LEVOTHROID) 112 MCG tablet Take 112 mcg by mouth daily before breakfast.   losartan-hydrochlorothiazide (HYZAAR) 100-12.5 MG tablet TAKE 1 TABLET BY MOUTH ONCE DAILY IN THE MORNING FOR BLOOD PRESSURE   Melatonin 3 MG TABS Take 3 mg by mouth at bedtime.    Multiple Vitamins-Minerals (CENTRUM SILVER PO) Take 1 tablet by mouth daily.    Omega-3 Fatty Acids (OMEGA-3 CF PO) Take 520 mg by mouth daily.    Polyethyl Glycol-Propyl Glycol (SYSTANE) 0.4-0.3 % SOLN Apply to eye. Use two drops of Systane as needed for dry eyes   Simethicone (GAS RELIEF 125 MAX ST PO) Take by mouth. Take 1-2 tablets as needed after meals   [DISCONTINUED] amLODipine (NORVASC) 5 MG tablet Take one tablet by mouth once daily to lower blood pressure   amLODipine (NORVASC) 2.5 MG tablet Take 1 tablet (2.5 mg total) by mouth daily.   amLODipine (NORVASC) 5 MG tablet Take 1 tablet (5 mg total) by mouth daily.   busPIRone (BUSPAR) 7.5 MG tablet Take 1 tablet (7.5 mg total) by mouth 2 (two) times daily. (Patient not taking: Reported on 03/24/2019)   No facility-administered encounter medications on file as of 03/24/2019.     Review of Systems    Constitutional: Negative.   HENT: Negative.   Respiratory: Negative.   Cardiovascular: Negative.   Gastrointestinal: Negative.   Genitourinary: Positive for frequency and urgency.  Musculoskeletal: Negative.   Skin: Negative.   Neurological: Negative.   Psychiatric/Behavioral: Positive for confusion. The patient is nervous/anxious.     Immunization History  Administered Date(s) Administered   Influenza, High Dose Seasonal PF 09/17/2017   Influenza,inj,Quad PF,6+ Mos 09/10/2018   Influenza-Unspecified 08/10/2013, 09/07/2015, 09/13/2016   Pneumococcal Conjugate-13 06/08/2017, 11/02/2018   Pneumococcal Polysaccharide-23 12/09/2002, 10/22/2017   Tdap 02/21/2014, 10/29/2017   Zoster Recombinat (Shingrix) 07/18/2017, 01/10/2018   Pertinent  Health Maintenance Due  Topic Date Due   INFLUENZA VACCINE  07/10/2019   DEXA SCAN  Completed   PNA vac Low Risk Adult  Completed  Fall Risk  03/24/2019 02/26/2019 02/17/2019 12/23/2018 10/30/2018  Falls in the past year? 0 0 0 1 0  Number falls in past yr: 0 0 0 0 0  Injury with Fall? 0 0 0 0 0   Functional Status Survey:    Vitals:   03/24/19 1503  BP: (!) 158/96 Repeated Manually and it was 164/80  Pulse: 69  Temp: 97.7 F (36.5 C)  SpO2: 98%  Weight: 164 lb 6.4 oz (74.6 kg)  Height: 5\' 4"  (1.626 m)   Body mass index is 28.22 kg/m. Physical Exam Constitutional:      Appearance: Normal appearance.  HENT:     Head: Normocephalic.     Nose: Nose normal.     Mouth/Throat:     Mouth: Mucous membranes are moist.     Pharynx: Oropharynx is clear.  Eyes:     Pupils: Pupils are equal, round, and reactive to light.  Neck:     Musculoskeletal: Neck supple.  Cardiovascular:     Rate and Rhythm: Normal rate and regular rhythm.     Pulses: Normal pulses.  Pulmonary:     Effort: Pulmonary effort is normal.     Breath sounds: Normal breath sounds.  Musculoskeletal:        General: No swelling.  Skin:    General: Skin is  warm and dry.  Neurological:     General: No focal deficit present.     Mental Status: She is alert.  Psychiatric:        Mood and Affect: Mood normal.        Thought Content: Thought content normal.        Judgment: Judgment normal.     Labs reviewed: Recent Labs    05/25/18 0805 12/31/18 0800  NA 134* 134*  K 4.5 4.7  CL 99 98  CO2 26 26  GLUCOSE 85 90  BUN 15 16  CREATININE 0.72 0.72  CALCIUM 9.6 9.7   Recent Labs    05/25/18 0805 12/31/18 0800  AST 18 18  ALT 16 13  BILITOT 0.6 0.4  PROT 6.8 6.9   Recent Labs    05/25/18 0805  WBC 5.9  HGB 13.6  HCT 38.6  MCV 86.4  PLT 334   Lab Results  Component Value Date   TSH 3.14 12/31/2018   No results found for: HGBA1C Lab Results  Component Value Date   CHOL 209 (H) 12/31/2018   HDL 60 12/31/2018   LDLCALC 123 (H) 12/31/2018   TRIG 144 12/31/2018   CHOLHDL 3.5 12/31/2018    Significant Diagnostic Results in last 30 days:  No results found.  Assessment/Plan 1. Essential hypertension It is still high  Will increase her Norvasc to 7.5 mg And Continue to monitor her BP at home She will come and see me in 2 months after her Blood work   Hypothyroidism TSH was normal in 01/20 Continue same dose Mixed hyperlipidemia LDL high Patient and her Husband refused to take statin for now Will talk about it in next visit Urinary Incontinence This seems to be old issue She is not interested in any new treatment Dementia Continue to refuse to take Namnda and Aricept Has follow up with Dr Jannifer Franklin in 1 week Covid Prophlaxis D/W the them to keep taking precautions and continue restriction. They are not [facility] out oif the [facility] and ordering most of their supplies online  Family/ staff Communication:   Labs/tests ordered:  Follow up in 3  months with Labs before that  CMP,CBC, Fasting Lipid, TSH

## 2019-03-29 ENCOUNTER — Telehealth: Payer: Self-pay | Admitting: Neurology

## 2019-03-29 NOTE — Telephone Encounter (Signed)
Pt's husband has consented to a Tele Visit since they are not able to do a Virtual Visit due to the Mic on the laptop not working. Husband also consented to insurance and copay to be billed as such.

## 2019-03-29 NOTE — Telephone Encounter (Signed)
Noted. Pt will be called closer to scheduled appt to complete pre charting.

## 2019-03-31 NOTE — Telephone Encounter (Signed)
I attempted to contact the pt and was connected with her husband, ok per dpr. I reviewed meds, alerrgies and PMH and updated appropriately. Husband wanted it noted their [address] # [place] is [phone].

## 2019-04-01 ENCOUNTER — Ambulatory Visit (INDEPENDENT_AMBULATORY_CARE_PROVIDER_SITE_OTHER): Payer: PPO | Admitting: Neurology

## 2019-04-01 ENCOUNTER — Encounter: Payer: Self-pay | Admitting: Neurology

## 2019-04-01 ENCOUNTER — Other Ambulatory Visit: Payer: Self-pay

## 2019-04-01 DIAGNOSIS — R413 Other amnesia: Secondary | ICD-10-CM | POA: Diagnosis not present

## 2019-04-01 MED ORDER — RIVASTIGMINE TARTRATE 1.5 MG PO CAPS: 1.5000 mg | ORAL_CAPSULE | Freq: Two times a day (BID) | ORAL | 3 refills | Status: DC

## 2019-04-01 NOTE — Progress Notes (Signed)
     Virtual Visit via Telephone Note  I connected with Frances Holland on 04/01/19 at 11:00 AM EDT by telephone and verified that I am speaking with the correct person using two identifiers.   I discussed the limitations, risks, security and privacy concerns of performing an evaluation and management service by telephone and the availability of in person appointments. I also discussed with the patient that there may be a patient responsible charge related to this service. The patient expressed understanding and agreed to proceed.  The [facility] is at [facility] [place], physician in office.   History of Present Illness: Frances Holland is a 85 year old right-handed white female with a history of a progressive memory disturbance.  The patient has not been able to tolerate Aricept or Namenda in the past.  She believes that her memory has not changed much since last seen.  She does not operate a motor vehicle.  She does require some assistance keeping up with medications and appointments.  She lives with her husband who does all the finances and helps keep her organized.  The patient indicates that she is sleeping fairly well.  She has not given up any activities of daily living because of memory since last seen.  She does continue to read books and do crossword puzzles frequently.   Observations/Objective: On the telephone visit, the patient is alert and cooperative at the time examination, she is answering questions appropriately.  She has normal speech pattern, without aphasia or dysarthria.  The [place]-blind evaluation reveals a score of 14/22.  Assessment and Plan: 1.  Progressive memory disturbance  The patient has not done well with tolerating medications for her memory.  We will try a very low-dose Exelon taking 1.5 mg twice daily.  If the patient cannot tolerate this drug, she is to contact me.  She will otherwise follow-up in 6 months.  Follow Up Instructions: 49-month follow-up, may see nurse  practitioner.   I discussed the assessment and treatment plan with the patient. The patient was provided an opportunity to ask questions and all were answered. The patient agreed with the plan and demonstrated an understanding of the instructions.   The patient was advised to call back or seek an in-person evaluation if the symptoms worsen or if the condition fails to improve as anticipated.  I provided 21 minutes of non-face-to-face time during this encounter.   Kathrynn Ducking, MD

## 2019-04-04 ENCOUNTER — Encounter: Payer: Self-pay | Admitting: Internal Medicine

## 2019-04-05 ENCOUNTER — Telehealth: Payer: Self-pay | Admitting: Neurology

## 2019-04-05 DIAGNOSIS — G301 Alzheimer's disease with late onset: Principal | ICD-10-CM

## 2019-04-05 DIAGNOSIS — F0280 Dementia in other diseases classified elsewhere without behavioral disturbance: Secondary | ICD-10-CM

## 2019-04-05 NOTE — Telephone Encounter (Signed)
Pt husband called in and wanted to know if a order can be sent over to [facility] for pt to receive speech therapy due to her condition , he states [facility] has advised him this will help her and they highly recommend it be done   [place]

## 2019-04-05 NOTE — Telephone Encounter (Signed)
I called and talk with the husband.  The patient is at [facility] [place], [facility] care goes to [facility] [place], I will put an order in for speech therapy.

## 2019-04-08 ENCOUNTER — Telehealth: Payer: Self-pay

## 2019-04-08 DIAGNOSIS — R41841 Cognitive communication deficit: Secondary | ICD-10-CM | POA: Diagnosis not present

## 2019-04-08 NOTE — Telephone Encounter (Signed)
Herbie Baltimore Speech Therapist with [facility] [place] called requesting recent office visit note to be faxed to [phone].  Pt is scheduled to have her speech evaluation the afternoon of 04/08/19.

## 2019-04-14 DIAGNOSIS — R41841 Cognitive communication deficit: Secondary | ICD-10-CM | POA: Diagnosis not present

## 2019-04-19 ENCOUNTER — Other Ambulatory Visit: Payer: Self-pay

## 2019-04-28 ENCOUNTER — Encounter: Payer: Self-pay | Admitting: Internal Medicine

## 2019-05-05 DIAGNOSIS — L570 Actinic keratosis: Secondary | ICD-10-CM | POA: Diagnosis not present

## 2019-05-05 DIAGNOSIS — Z85828 Personal history of other malignant neoplasm of skin: Secondary | ICD-10-CM | POA: Diagnosis not present

## 2019-05-05 DIAGNOSIS — L821 Other seborrheic keratosis: Secondary | ICD-10-CM | POA: Diagnosis not present

## 2019-05-12 DIAGNOSIS — R2689 Other abnormalities of gait and mobility: Secondary | ICD-10-CM | POA: Diagnosis not present

## 2019-05-12 DIAGNOSIS — M545 Low back pain: Secondary | ICD-10-CM | POA: Diagnosis not present

## 2019-05-12 DIAGNOSIS — M6281 Muscle weakness (generalized): Secondary | ICD-10-CM | POA: Diagnosis not present

## 2019-05-12 DIAGNOSIS — R2681 Unsteadiness on feet: Secondary | ICD-10-CM | POA: Diagnosis not present

## 2019-05-27 ENCOUNTER — Other Ambulatory Visit: Payer: Self-pay

## 2019-05-27 ENCOUNTER — Other Ambulatory Visit: Payer: PPO

## 2019-05-27 DIAGNOSIS — E890 Postprocedural hypothyroidism: Secondary | ICD-10-CM | POA: Diagnosis not present

## 2019-05-27 DIAGNOSIS — E782 Mixed hyperlipidemia: Secondary | ICD-10-CM | POA: Diagnosis not present

## 2019-05-27 DIAGNOSIS — I10 Essential (primary) hypertension: Secondary | ICD-10-CM

## 2019-05-28 LAB — COMPLETE METABOLIC PANEL WITH GFR
AG Ratio: 1.5 (calc) (ref 1.0–2.5)
ALT: 13 U/L (ref 6–29)
AST: 17 U/L (ref 10–35)
Albumin: 4.1 g/dL (ref 3.6–5.1)
Alkaline phosphatase (APISO): 68 U/L (ref 37–153)
BUN: 16 mg/dL (ref 7–25)
CO2: 26 mmol/L (ref 20–32)
Calcium: 9.8 mg/dL (ref 8.6–10.4)
Chloride: 97 mmol/L — ABNORMAL LOW (ref 98–110)
Creat: 0.76 mg/dL (ref 0.60–0.88)
GFR, Est African American: 81 mL/min/{1.73_m2} (ref >=60)
GFR, Est Non African American: 70 mL/min/{1.73_m2} (ref >=60)
Globulin: 2.8 g/dL (calc) (ref 1.9–3.7)
Glucose, Bld: 90 mg/dL (ref 65–99)
Potassium: 4.6 mmol/L (ref 3.5–5.3)
Sodium: 133 mmol/L — ABNORMAL LOW (ref 135–146)
Total Bilirubin: 0.3 mg/dL (ref 0.2–1.2)
Total Protein: 6.9 g/dL (ref 6.1–8.1)

## 2019-05-28 LAB — CBC WITH DIFFERENTIAL/PLATELET
Absolute Monocytes: 766 cells/uL (ref 200–950)
Basophils Absolute: 59 cells/uL (ref 0–200)
Basophils Relative: 0.9 %
Eosinophils Absolute: 231 cells/uL (ref 15–500)
Eosinophils Relative: 3.5 %
HCT: 39 % (ref 35.0–45.0)
Hemoglobin: 13.2 g/dL (ref 11.7–15.5)
Lymphs Abs: 1630 cells/uL (ref 850–3900)
MCH: 30.4 pg (ref 27.0–33.0)
MCHC: 33.8 g/dL (ref 32.0–36.0)
MCV: 89.9 fL (ref 80.0–100.0)
MPV: 10.2 fL (ref 7.5–12.5)
Monocytes Relative: 11.6 %
Neutro Abs: 3914 cells/uL (ref 1500–7800)
Neutrophils Relative %: 59.3 %
Platelets: 412 10*3/uL — ABNORMAL HIGH (ref 140–400)
RBC: 4.34 10*6/uL (ref 3.80–5.10)
RDW: 12.6 % (ref 11.0–15.0)
Total Lymphocyte: 24.7 %
WBC: 6.6 10*3/uL (ref 3.8–10.8)

## 2019-05-28 LAB — LIPID PANEL
Cholesterol: 205 mg/dL — ABNORMAL HIGH (ref <200)
HDL: 57 mg/dL (ref >=50)
LDL Cholesterol (Calc): 124 mg/dL (calc) — ABNORMAL HIGH
Non-HDL Cholesterol (Calc): 148 mg/dL (calc) — ABNORMAL HIGH (ref <130)
Total CHOL/HDL Ratio: 3.6 (calc) (ref <5.0)
Triglycerides: 129 mg/dL (ref <150)

## 2019-05-28 LAB — TSH: TSH: 0.57 mIU/L (ref 0.40–4.50)

## 2019-06-02 ENCOUNTER — Other Ambulatory Visit: Payer: Self-pay

## 2019-06-02 ENCOUNTER — Non-Acute Institutional Stay: Payer: PPO | Admitting: Internal Medicine

## 2019-06-02 ENCOUNTER — Encounter: Payer: Self-pay | Admitting: Internal Medicine

## 2019-06-02 VITALS — BP 134/80 | HR 82 | Temp 97.9°F | Ht 64.0 in | Wt 165.2 lb

## 2019-06-02 DIAGNOSIS — R4189 Other symptoms and signs involving cognitive functions and awareness: Secondary | ICD-10-CM | POA: Diagnosis not present

## 2019-06-02 DIAGNOSIS — I10 Essential (primary) hypertension: Secondary | ICD-10-CM

## 2019-06-02 DIAGNOSIS — E890 Postprocedural hypothyroidism: Secondary | ICD-10-CM

## 2019-06-02 DIAGNOSIS — N814 Uterovaginal prolapse, unspecified: Secondary | ICD-10-CM | POA: Diagnosis not present

## 2019-06-02 DIAGNOSIS — E782 Mixed hyperlipidemia: Secondary | ICD-10-CM | POA: Diagnosis not present

## 2019-06-02 NOTE — Progress Notes (Signed)
Location:      Place of Service:     Provider:   Code Status:  Goals of Care:  Advanced Directives 03/24/2019  Does Patient Have a Medical Advance Directive? Yes  Type of Paramedic of [place];Out of facility DNR (pink MOST or yellow form)  Does patient want to make changes to medical advance directive? No - Patient declined  Copy of [place] in Chart? -  Pre-existing out of facility DNR order (yellow form or pink MOST form) Pink MOST form placed in chart (order not valid for inpatient use)     Chief Complaint  Patient presents with  . Medical Management of Chronic Issues    4 month follow up with MMSE score 27/30 did not pass clock test     HPI: Patient is a 85 y.o. female seen today for medical management of chronic diseases.   Patient has h/o Hypertension, Hypothyroidism, Insomnia and Cognitive impairment. Also Has h/o Skin Cancer and Thyroid Cancer  Patient here with her husband.   She continues to have cognitive impairment.  Per her husband there is no recent change.  They worked with speech therapy. She has discontinued Aricept and Namenda before because of concerns of side effects.  She also does not want to take statin.  She was started on Exelon patch by Dr. Jannifer Franklin but they discontinued that too Her husband wanted to talk about how he wants to take care of her finacial trust.  He is working with a Chief Executive Officer and  needs a letter from her physician Patient also complained that sometimes when she goes to the bathroom she feels something in her anal area is coming out. She denies any constipation or blood in her stools Patient right now is independent in her ADLs.  Takes her own medications under the supervision of her husband.   Past Medical History:  Diagnosis Date  . Back pain   . Cancer [facility])    left breaset mastectomy   . Candidiasis of skin and nails   . Cellulitis and abscess of upper arm and forearm   . Heartburn   .  Hemorrhoids 08/20/2016  . History of breast cancer 2000   History left mastectomy  . 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 Hypertension   . Insomnia   . Knee pain, bilateral 2014  . Lymphedema    Left arm following mastectomy  . Memory impairment   . Memory loss   . Migraine 05/16/2012  . Migraine without aura, without mention of intractable migraine without mention of status migrainosus    patient denies at preop of 08/08/16   . Other and unspecified hyperlipidemia   . Other bursitis disorders   . Other infective bursitis, left shoulder   . Pain in joint, ankle and foot   . Pain in joint, hand   . Pain in joint, lower leg   . Pain in joint, shoulder region   . Pain in joint, shoulder region 04/23/2016  . Pain in limb   . Rectal bleeding 08/20/2016  . Senile osteoporosis   . Symptomatic menopausal or female climacteric states   . Syncope and collapse   . Unspecified hypertensive heart disease without heart failure 1995  . Unspecified hypothyroidism   . Unspecified polyarthropathy or polyarthritis, site unspecified   . Unspecified urinary incontinence   . Unspecified visual loss   . Xerophthalmia     Past Surgical History:  Procedure Laterality Date  . BILATERAL OOPHORECTOMY  [place] Left 01/2000  . BREAST REDUCTION SURGERY Right 01/2000  . CATARACT EXTRACTION Right 09/04/2005  . COLONOSCOPY  01/2004  . MASTECTOMY Left 10/1999  . THYROID LOBECTOMY Left 08/14/2016   Procedure: NEAR TOTAL THYROIDECTOMY;  Surgeon: Johnathan Hausen, MD;  Location: [facility];  Service: General;  Laterality: Left;  . TOTAL ABDOMINAL HYSTERECTOMY W/ BILATERAL SALPINGOOPHORECTOMY  1981  . TOTAL KNEE ARTHROPLASTY Right 2014    Allergies  Allergen Reactions  . Donepezil Hcl     Body pain and stiffness   . Namenda [Memantine Hcl]      Caused body pain   . Sulfa Antibiotics   . Sulfamethoxazole Rash    Outpatient Encounter Medications as of 06/02/2019  Medication Sig  .  amLODipine (NORVASC) 2.5 MG tablet Take 1 tablet (2.5 mg total) by mouth daily.  Marland Kitchen amLODipine (NORVASC) 5 MG tablet Take 1 tablet (5 mg total) by mouth daily.  Marland Kitchen Apoaequorin (PREVAGEN PO) Take 1 tablet by mouth every morning.  Marland Kitchen aspirin EC 81 MG tablet Take 81 mg by mouth daily.  . Calcium Carb-Cholecalciferol (CALCIUM 600-D PO) Take 1 tablet by mouth daily.  . Cholecalciferol 5000 units capsule Take 5,000 Units by mouth daily.  Marland Kitchen levothyroxine (SYNTHROID, LEVOTHROID) 112 MCG tablet Take 112 mcg by mouth daily before breakfast.  . losartan-hydrochlorothiazide (HYZAAR) 100-12.5 MG tablet TAKE 1 TABLET BY MOUTH ONCE DAILY IN THE MORNING FOR BLOOD PRESSURE  . Melatonin 3 MG TABS Take 3 mg by mouth at bedtime.   . Multiple Vitamins-Minerals (CENTRUM SILVER PO) Take 1 tablet by mouth daily.   . Omega-3 Fatty Acids (OMEGA-3 CF PO) Take 520 mg by mouth daily.   Vladimir Faster Glycol-Propyl Glycol (SYSTANE) 0.4-0.3 % SOLN Place into both eyes. Use two drops of Systane as needed for dry eyes  . rivastigmine (EXELON) 1.5 MG capsule Take 1 capsule (1.5 mg total) by mouth 2 (two) times daily.  . Simethicone (GAS RELIEF 125 MAX ST PO) Take by mouth. Take 1-2 tablets as needed after meals   No facility-administered encounter medications on file as of 06/02/2019.     Review of Systems:  Review of Systems  Constitutional: Negative.   HENT: Negative.   Respiratory: Negative.   Cardiovascular: Negative.   Gastrointestinal: Negative.   Musculoskeletal: Negative.   Skin: Negative.   Neurological: Negative.   Psychiatric/Behavioral: Positive for confusion.    Health Maintenance  Topic Date Due  . INFLUENZA VACCINE  07/10/2019  . TETANUS/TDAP  10/30/2027  . DEXA SCAN  Completed  . PNA vac Low Risk Adult  Completed    Physical Exam: Vitals:   06/02/19 1601  BP: 134/80  Pulse: 82  Temp: 97.9 F (36.6 C)  TempSrc: Oral  SpO2: 96%  Weight: 165 lb 3.2 oz (74.9 kg)  Height: 5\' 4"  (1.626 m)   Body  mass index is 28.36 kg/m. Physical Exam Constitutional: . Well-developed and well-nourished.  HENT:  Head: Normocephalic.  Mouth/Throat: Oropharynx is clear and moist.  Eyes: Pupils are equal, round, and reactive to light.  Neck: Neck supple.  Cardiovascular: Normal rate and normal heart sounds.  No murmur heard. Pulmonary/Chest: Effort normal and breath sounds normal. No respiratory distress. No wheezes. She has no rales.  Abdominal: Soft. Bowel sounds are normal. No distension. There is no tenderness. There is no rebound.  Musculoskeletal: No edema.  Rectal Exam No Rectal Prolapse. Does have External hemorrhoids Also Has Uterine Prolapse  Neurological: No Focal Deficits. Gait was  stable No Cane or Walker  Skin: Skin is warm and dry.  Psychiatric: Normal mood and affect. Behavior is normal. Thought content normal.    Labs reviewed: Basic Metabolic Panel: Recent Labs    12/31/18 0800 05/27/19 0000  NA 134* 133*  K 4.7 4.6  CL 98 97*  CO2 26 26  GLUCOSE 90 90  BUN 16 16  CREATININE 0.72 0.76  CALCIUM 9.7 9.8  TSH 3.14 0.57   Liver Function Tests: Recent Labs    12/31/18 0800 05/27/19 0000  AST 18 17  ALT 13 13  BILITOT 0.4 0.3  PROT 6.9 6.9   No results for input(s): LIPASE, AMYLASE in the last 8760 hours. No results for input(s): AMMONIA in the last 8760 hours. CBC: Recent Labs    05/27/19 0000  WBC 6.6  NEUTROABS 3,914  HGB 13.2  HCT 39.0  MCV 89.9  PLT 412*   Lipid Panel: Recent Labs    12/31/18 0800 05/27/19 0000  CHOL 209* 205*  HDL 60 57  LDLCALC 123* 124*  TRIG 144 129  CHOLHDL 3.5 3.6   No results found for: HGBA1C  Procedures since last visit: No results found.  Assessment/Plan .  Essential hypertension Doing well on Norvasc and Hyzaar Renal Function Stable  Hypothyroidism TSH Normal on 6/20 Mixed hyperlipidemia LDL high again D/W them about try some other Statin beside Lipitor They will think and Let me know next visit  Dementia Stopped her Exelon also Has not tolerated Aricept or Namenda Follows with Neurology Repeat MMSE here was 27/30 Failed her Clock Drawing I had a long discussion with her husband who wants to take over her financial trust. Patient recently was working with speech.  I will discuss with them in do feel comfortable him the letter as suggested by the lawyer He already have the healthcare power of attorney assigned to their Children who   live in town Uterine prolapse This seems to be older problem documented by Dr. Bubba Camp in 2018. Patient got confused before but then she said she was told that she does have uterine prolapse and she had refused to see a gynecologist before.  At this time she was not sure if  she wants to see them.  Go ahead and Make referal   Labs/tests ordered:  * No order type specified * Next appt:  09/27/2019  Total time spent in this patient care encounter was  60_  minutes; greater than 50% of the visit spent counseling patient and staff, reviewing records , Labs and coordinating care for problems addressed at this encounter.

## 2019-06-14 ENCOUNTER — Other Ambulatory Visit: Payer: Self-pay | Admitting: Internal Medicine

## 2019-06-14 ENCOUNTER — Other Ambulatory Visit: Payer: Self-pay | Admitting: *Deleted

## 2019-06-14 DIAGNOSIS — Z1231 Encounter for screening mammogram for malignant neoplasm of breast: Secondary | ICD-10-CM

## 2019-06-24 ENCOUNTER — Ambulatory Visit: Payer: PPO | Admitting: Gynecology

## 2019-07-01 ENCOUNTER — Other Ambulatory Visit: Payer: Self-pay

## 2019-07-02 ENCOUNTER — Encounter: Payer: Self-pay | Admitting: Gynecology

## 2019-07-02 ENCOUNTER — Ambulatory Visit: Payer: PPO | Admitting: Gynecology

## 2019-07-02 VITALS — BP 124/82 | Ht 63.0 in | Wt 163.0 lb

## 2019-07-02 DIAGNOSIS — N8111 Cystocele, midline: Secondary | ICD-10-CM | POA: Diagnosis not present

## 2019-07-02 DIAGNOSIS — N819 Female genital prolapse, unspecified: Secondary | ICD-10-CM

## 2019-07-02 NOTE — Progress Notes (Signed)
    Frances Holland 1929-07-07 [id number]        85 y.o.  G3P3 new patient presents complaining of 6 months or so of a bulge from her vagina.  It is not bothersome to her but she just noticed it.  She wanted to make sure that nothing serious was going on.  She is having no pain.  No urinary symptoms such as incontinence or pain with urination.  Having regular bowel movements without difficulty.  History of abdominal hysterectomy with BSO in the past for heavy periods.  No history of gynecologic issues otherwise.  Past medical history,surgical history, problem list, medications, allergies, family history and social history were all reviewed and documented in the EPIC chart.  Directed ROS with pertinent positives and negatives documented in the history of present illness/assessment and plan.  Exam: [name] assistant Vitals:   07/02/19 1205  BP: 124/82  Weight: 163 lb (73.9 kg)  Height: 5\' 3"  (1.6 m)   General appearance:  Normal Abdomen soft nontender without masses guarding rebound Pelvic external BUS vagina with atrophic changes.  Cystocele/enterocele component with upper vaginal prolapse to several fingerbreadths outside the introital opening with straining.  No evidence of mucosal irritation or ulceration.  No significant rectocele component.  Bimanual exam without masses or tenderness.  Rectal exam is normal  Assessment/Plan:  85 y.o. G3P3 with cystocele/vaginal prolapse.  Not bothersome to the patient other than she just wanted to make sure there was no significant issues.  She is voiding and having bowel movements without difficulty.  No evidence of mucosal irritation from rubbing.  We reviewed the anatomy to include pictures so that she had a clear understanding of what is going on.  We reviewed options to include observation, pessary trial and surgery.  At this point as it is not bothersome to the patient and she is reassured that there is no serious pathology she prefers observation.   She will represent if she changes her mind and wants a pessary trial.  Otherwise she will continue to follow-up with her primary physician for ongoing health care.   Anastasio Auerbach MD, 12:39 PM 07/02/2019

## 2019-07-02 NOTE — Patient Instructions (Signed)
Follow-up if the vaginal bulging becomes an issue and we can try a pessary.

## 2019-07-05 ENCOUNTER — Other Ambulatory Visit: Payer: Self-pay | Admitting: *Deleted

## 2019-07-05 MED ORDER — LEVOTHYROXINE SODIUM 112 MCG PO TABS: 112.0000 ug | ORAL_TABLET | Freq: Every day | ORAL | 1 refills | Status: DC

## 2019-07-05 NOTE — Telephone Encounter (Signed)
Patient husband requested refill be sent to [facility] in [place]. Stated that he was told by a Dr. Parks Ranger it would be a lot cheaper there.

## 2019-07-27 DIAGNOSIS — H35033 Hypertensive retinopathy, bilateral: Secondary | ICD-10-CM | POA: Diagnosis not present

## 2019-07-27 DIAGNOSIS — H524 Presbyopia: Secondary | ICD-10-CM | POA: Diagnosis not present

## 2019-07-29 ENCOUNTER — Ambulatory Visit
Admission: RE | Admit: 2019-07-29 | Discharge: 2019-07-29 | Disposition: A | Discharge status: Home / Self Care | Payer: PPO | Source: Ambulatory Visit | Attending: Internal Medicine | Admitting: Internal Medicine

## 2019-07-29 ENCOUNTER — Other Ambulatory Visit: Payer: Self-pay

## 2019-07-29 DIAGNOSIS — Z1231 Encounter for screening mammogram for malignant neoplasm of breast: Secondary | ICD-10-CM

## 2019-09-01 DIAGNOSIS — C73 Malignant neoplasm of thyroid gland: Secondary | ICD-10-CM | POA: Diagnosis not present

## 2019-09-01 DIAGNOSIS — E890 Postprocedural hypothyroidism: Secondary | ICD-10-CM | POA: Diagnosis not present

## 2019-09-01 DIAGNOSIS — F418 Other specified anxiety disorders: Secondary | ICD-10-CM | POA: Diagnosis not present

## 2019-09-01 DIAGNOSIS — G3184 Mild cognitive impairment, so stated: Secondary | ICD-10-CM | POA: Diagnosis not present

## 2019-09-01 DIAGNOSIS — Z1331 Encounter for screening for depression: Secondary | ICD-10-CM | POA: Diagnosis not present

## 2019-09-01 DIAGNOSIS — E871 Hypo-osmolality and hyponatremia: Secondary | ICD-10-CM | POA: Diagnosis not present

## 2019-09-01 DIAGNOSIS — E785 Hyperlipidemia, unspecified: Secondary | ICD-10-CM | POA: Diagnosis not present

## 2019-09-13 ENCOUNTER — Other Ambulatory Visit: Payer: Self-pay | Admitting: Family

## 2019-09-13 MED ORDER — LOSARTAN POTASSIUM-HCTZ 100-12.5 MG PO TABS: ORAL_TABLET | ORAL | 1 refills | Status: DC

## 2019-09-16 ENCOUNTER — Encounter: Payer: Self-pay | Admitting: Gynecology

## 2019-09-20 ENCOUNTER — Telehealth: Payer: Self-pay | Admitting: *Deleted

## 2019-09-20 NOTE — Telephone Encounter (Signed)
Patient husband called and stated that last Wednesday he dropped off a Certificate of Disability at the [facility] for Dr. Lyndel Safe to fill out and sign. Patient is wanting to know when this will be completed. Please Advise.

## 2019-09-20 NOTE — Telephone Encounter (Signed)
Patient husband notified and agreed.

## 2019-09-20 NOTE — Telephone Encounter (Signed)
Let him know that I have it and will work on it this week. I should have for him when he comes for his Follow up appointment in clinic with me.

## 2019-09-23 ENCOUNTER — Other Ambulatory Visit: Payer: Self-pay

## 2019-09-27 ENCOUNTER — Other Ambulatory Visit: Payer: Self-pay

## 2019-09-28 ENCOUNTER — Other Ambulatory Visit: Payer: Self-pay | Admitting: Internal Medicine

## 2019-09-28 ENCOUNTER — Other Ambulatory Visit: Payer: Self-pay | Admitting: Family

## 2019-09-29 ENCOUNTER — Encounter: Payer: Self-pay | Admitting: Internal Medicine

## 2019-09-30 ENCOUNTER — Other Ambulatory Visit: Payer: Self-pay

## 2019-09-30 ENCOUNTER — Other Ambulatory Visit: Payer: PPO

## 2019-09-30 DIAGNOSIS — E782 Mixed hyperlipidemia: Secondary | ICD-10-CM

## 2019-09-30 DIAGNOSIS — I10 Essential (primary) hypertension: Secondary | ICD-10-CM | POA: Diagnosis not present

## 2019-10-01 LAB — LIPID PANEL
Cholesterol: 209 mg/dL — ABNORMAL HIGH (ref <200)
HDL: 61 mg/dL (ref >=50)
LDL Cholesterol (Calc): 125 mg/dL (calc) — ABNORMAL HIGH
Non-HDL Cholesterol (Calc): 148 mg/dL (calc) — ABNORMAL HIGH (ref <130)
Total CHOL/HDL Ratio: 3.4 (calc) (ref <5.0)
Triglycerides: 121 mg/dL (ref <150)

## 2019-10-01 LAB — COMPLETE METABOLIC PANEL WITH GFR
AG Ratio: 1.4 (calc) (ref 1.0–2.5)
ALT: 15 U/L (ref 6–29)
AST: 18 U/L (ref 10–35)
Albumin: 4.1 g/dL (ref 3.6–5.1)
Alkaline phosphatase (APISO): 67 U/L (ref 37–153)
BUN: 17 mg/dL (ref 7–25)
CO2: 22 mmol/L (ref 20–32)
Calcium: 9.9 mg/dL (ref 8.6–10.4)
Chloride: 97 mmol/L — ABNORMAL LOW (ref 98–110)
Creat: 0.75 mg/dL (ref 0.60–0.88)
GFR, Est African American: 82 mL/min/{1.73_m2} (ref >=60)
GFR, Est Non African American: 71 mL/min/{1.73_m2} (ref >=60)
Globulin: 2.9 g/dL (calc) (ref 1.9–3.7)
Glucose, Bld: 94 mg/dL (ref 65–99)
Potassium: 4.8 mmol/L (ref 3.5–5.3)
Sodium: 133 mmol/L — ABNORMAL LOW (ref 135–146)
Total Bilirubin: 0.5 mg/dL (ref 0.2–1.2)
Total Protein: 7 g/dL (ref 6.1–8.1)

## 2019-10-01 LAB — CBC WITH DIFFERENTIAL/PLATELET
Absolute Monocytes: 660 cells/uL (ref 200–950)
Basophils Absolute: 42 cells/uL (ref 0–200)
Basophils Relative: 0.7 %
Eosinophils Absolute: 198 cells/uL (ref 15–500)
Eosinophils Relative: 3.3 %
HCT: 39.4 % (ref 35.0–45.0)
Hemoglobin: 13.2 g/dL (ref 11.7–15.5)
Lymphs Abs: 1752 cells/uL (ref 850–3900)
MCH: 29.5 pg (ref 27.0–33.0)
MCHC: 33.5 g/dL (ref 32.0–36.0)
MCV: 87.9 fL (ref 80.0–100.0)
MPV: 10.2 fL (ref 7.5–12.5)
Monocytes Relative: 11 %
Neutro Abs: 3348 cells/uL (ref 1500–7800)
Neutrophils Relative %: 55.8 %
Platelets: 401 10*3/uL — ABNORMAL HIGH (ref 140–400)
RBC: 4.48 10*6/uL (ref 3.80–5.10)
RDW: 12.9 % (ref 11.0–15.0)
Total Lymphocyte: 29.2 %
WBC: 6 10*3/uL (ref 3.8–10.8)

## 2019-10-01 LAB — TSH: TSH: 0.61 mIU/L (ref 0.40–4.50)

## 2019-10-03 NOTE — Progress Notes (Signed)
PATIENT: Frances Holland DOB: Apr 06, 1929  REASON FOR VISIT: follow up HISTORY FROM: patient  HISTORY OF PRESENT ILLNESS: Today 10/04/19  Frances Holland is an 85 year old female with history of a progressive memory disturbance.  She has been unable to tolerate Aricept, Namenda, or Exelon.  She presents today for follow-up accompanied by her husband.  They continue to live in independent living at [facility].  She feels that her memory has been stable since last seen.  She does not drive a car.  She is able to perform their housework quite well.  She keeps the house neat, sets the table, and manages the laundry.  In general she sleeps well at night, but she reports it takes her a long time to fall asleep.  She has not had any recent falls.  She does not use any assistive devices for ambulation.  She and her husband walk on average 20 minutes daily.  She enjoys reading and doing crossword puzzles. Her husband manages their finances. Of recent, she has tried to access some of her accounts and has been unable to because she can't answer the initial security questions.  She is taking Prevagen daily.  She completed speech therapy for her memory, but felt it was not helpful.  She has paperwork today for Dr. Jannifer Franklin to sign allowing her husband to manage her financial affairs.  HISTORY 04/01/2019 Dr. Jannifer Franklin: Frances Holland is a 85 year old right-handed white female with a history of a progressive memory disturbance.  The patient has not been able to tolerate Aricept or Namenda in the past.  She believes that her memory has not changed much since last seen.  She does not operate a motor vehicle.  She does require some assistance keeping up with medications and appointments.  She lives with her husband who does all the finances and helps keep her organized.  The patient indicates that she is sleeping fairly well.  She has not given up any activities of daily living because of memory since last seen.  She does continue  to read books and do crossword puzzles frequently.  REVIEW OF SYSTEMS: Out of a complete 14 system review of symptoms, the patient complains only of the following symptoms, and all other reviewed systems are negative.  Memory loss   ALLERGIES: Allergies  Allergen Reactions   Donepezil Hcl     Body pain and stiffness    Namenda [Memantine Hcl]      Caused body pain    Sulfa Antibiotics    Sulfamethoxazole Rash    HOME MEDICATIONS: Outpatient Medications Prior to Visit  Medication Sig Dispense Refill   amLODipine (NORVASC) 2.5 MG tablet TAKE 1 TABLET BY MOUTH ONCE DAILY TO  LOWER  BLOOD  PRESSURE 90 tablet 0   amLODipine (NORVASC) 5 MG tablet TAKE 1 TABLET BY MOUTH ONCE DAILY TO  LOWER  BLODD  PRESSURE 90 tablet 0   aspirin EC 81 MG tablet Take 81 mg by mouth daily.     Calcium Carb-Cholecalciferol (CALCIUM 600-D PO) Take 1 tablet by mouth daily.     Cholecalciferol 5000 units capsule Take 5,000 Units by mouth daily.     hydrochlorothiazide (HYDRODIURIL) 12.5 MG tablet Take 12.5 mg by mouth daily.     levothyroxine (SYNTHROID) 112 MCG tablet Take 1 tablet (112 mcg total) by mouth daily before breakfast. 90 tablet 1   losartan (COZAAR) 100 MG tablet Take 100 mg by mouth daily.     losartan-hydrochlorothiazide (HYZAAR) 100-12.5 MG tablet TAKE  1 TABLET BY MOUTH ONCE DAILY IN THE MORNING FOR BLOOD PRESSURE 90 tablet 1   MELATONIN PO Take 4 mg by mouth at bedtime.     Multiple Vitamins-Minerals (CENTRUM SILVER PO) Take 1 tablet by mouth daily.      Omega-3 Fatty Acids (OMEGA-3 CF PO) Take 520 mg by mouth daily.      Polyethyl Glycol-Propyl Glycol (SYSTANE) 0.4-0.3 % SOLN Place into both eyes. Use two drops of Systane as needed for dry eyes     rivastigmine (EXELON) 1.5 MG capsule Take 1 capsule (1.5 mg total) by mouth 2 (two) times daily. 60 capsule 3   Apoaequorin (PREVAGEN PO) Take 1 tablet by mouth every morning.     Melatonin 3 MG TABS Take 3 mg by mouth at  bedtime.      Simethicone (GAS RELIEF 125 MAX ST PO) Take by mouth. Take 1-2 tablets as needed after meals     No facility-administered medications prior to visit.     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   Hypertension    Insomnia    Knee pain, bilateral 2014   Lymphedema    Left arm following mastectomy   Memory impairment    Memory loss    Migraine 05/16/2012   Migraine without aura, without mention of intractable migraine without mention of status migrainosus    patient denies at preop of 08/08/16    Other and unspecified hyperlipidemia    Other bursitis disorders    Other infective bursitis, left shoulder    Pain in joint, ankle and foot    Pain in joint, hand    Pain in joint, lower leg    Pain in joint, shoulder region    Pain in joint, shoulder region 04/23/2016   Pain in limb    Rectal bleeding 08/20/2016   Senile osteoporosis    Symptomatic menopausal or female climacteric states    Syncope and collapse    Unspecified hypertensive heart disease without heart failure 1995   Unspecified hypothyroidism    Unspecified polyarthropathy or polyarthritis, site unspecified    Unspecified urinary incontinence    Unspecified visual loss    Xerophthalmia     PAST SURGICAL HISTORY: Past Surgical History:  Procedure Laterality Date   ABDOMINAL HYSTERECTOMY     BILATERAL OOPHORECTOMY  1981   BREAST RECONSTRUCTION Left 01/2000   BREAST REDUCTION SURGERY Right 01/2000   CATARACT EXTRACTION Right 09/04/2005   COLONOSCOPY  01/2004   MASTECTOMY Left 10/1999   THYROID LOBECTOMY Left 08/14/2016   Procedure: NEAR TOTAL THYROIDECTOMY;   Surgeon: Johnathan Hausen, MD;  Location: [facility];  Service: General;  Laterality: Left;   TOTAL ABDOMINAL HYSTERECTOMY W/ BILATERAL SALPINGOOPHORECTOMY  1981   TOTAL KNEE ARTHROPLASTY Right 2014    FAMILY HISTORY: Family History  Problem Relation Age of Onset   Heart disease Mother    Dementia Mother    Dementia Father    Asthma Son     SOCIAL HISTORY: Social History   Socioeconomic History   Marital status: Married    Spouse name: Not on file   Number of children:  3   Years of education: 14   Highest education level: Not on file  Occupational History   Occupation: retired Land strain: Not hard at all   Food insecurity    Worry: Never true    Inability: Never true   Transportation needs    Medical: No    Non-medical: No  Tobacco Use   Smoking status: Never Smoker   Smokeless tobacco: Never Used  Substance and Sexual Activity   Alcohol use: No   Drug use: No   Sexual activity: Never    Comment: 1st intercourse 85 yo-Fewer than 5 partners  Lifestyle   Physical activity    Days per week: 7 days    Minutes per session: 10 min   Stress: To some extent  Relationships   Social connections    Talks on phone: More than three times a week    Gets together: More than three times a week    Attends religious service: More than 4 times per year    Active member of club or organization: No    Attends meetings of clubs or organizations: Never    Relationship status: Married   Intimate partner violence    Fear of current or ex partner: No    Emotionally abused: No    Physically abused: No    Forced sexual activity: No  Other Topics Concern   Not on file  Social History Narrative   [facility] at [facility] since 07/07/2014   Married -[name]   Never smoked   Right handed     Alcohol none   Caffeine 2 cups of coffee daily   Exercise walks daily, exercise class half hour 2-3 times a week   Living  will,  POA   Patient believes that her memory is OK except for recall of names. Her husband is concerned about it. She scored 29/30 on MMSE in Dec 2017. They both have concerns about the future if he precedes her in death, but he has written down how he keeps accounts and has scheduled an appt with an accountant to do their taxes. They are a 2nd marriage and have his and her families although the "children" get along. They have been married 30 years.    PHYSICAL EXAM  Vitals:   10/04/19 1121  BP: (!) 147/88  Pulse: 75  Temp: (!) 96.9 F (36.1 C)  TempSrc: Oral  Weight: 166 lb 9.6 oz (75.6 kg)  Height: 5\' 3"  (1.6 m)   Body mass index is 29.51 kg/m.  Generalized: Well developed, in no acute distress  MMSE - Mini Mental State Exam 10/04/2019 06/02/2019 09/09/2018  Not completed: (No Data) - -  Orientation to time 5 3 5   Orientation to Place 2 5 4   Registration 3 3 3   Attention/ Calculation 4 5 5   Recall 1 2 0  Language- name 2 objects 2 2 2   Language- repeat 1 1 1   Language- follow 3 step command 3 3 3   Language- read & follow direction 1 1 1   Write a sentence 1 1 1   Copy design 1 1 1   Total score 24 27 26     Neurological examination  Mentation: Alert oriented to time, place, history taking. Follows all commands speech and language fluent Cranial nerve II-XII: Pupils were equal round reactive to light. Extraocular movements were full, visual field were full on confrontational test. Facial sensation and strength were normal. . Head  turning and shoulder shrug  were normal and symmetric. Motor: The motor testing reveals 5 over 5 strength of all 4 extremities. Good symmetric motor tone is noted throughout.  Sensory: Sensory testing is intact to soft touch on all 4 extremities. No evidence of extinction is noted.  Coordination: Cerebellar testing reveals good finger-nose-finger and heel-to-shin bilaterally.  Gait and station: Gait is normal, good pace, good stride, no assistive device  was used Reflexes: Deep tendon reflexes are symmetric and normal bilaterally.   DIAGNOSTIC DATA (LABS, IMAGING, TESTING) - I reviewed patient records, labs, notes, testing and imaging myself where available.  Lab Results  Component Value Date   WBC 6.0 09/30/2019   HGB 13.2 09/30/2019   HCT 39.4 09/30/2019   MCV 87.9 09/30/2019   PLT 401 (H) 09/30/2019      Component Value Date/Time   NA 133 (L) 09/30/2019 0000   NA 137 09/26/2016   K 4.8 09/30/2019 0000   CL 97 (L) 09/30/2019 0000   CO2 22 09/30/2019 0000   GLUCOSE 94 09/30/2019 0000   BUN 17 09/30/2019 0000   BUN 17 09/26/2016   CREATININE 0.75 09/30/2019 0000   CALCIUM 9.9 09/30/2019 0000   PROT 7.0 09/30/2019 0000   ALBUMIN 4.0 07/23/2017 0814   AST 18 09/30/2019 0000   ALT 15 09/30/2019 0000   ALKPHOS 65 07/23/2017 0814   BILITOT 0.5 09/30/2019 0000   GFRNONAA 71 09/30/2019 0000   GFRAA 82 09/30/2019 0000   Lab Results  Component Value Date   CHOL 209 (H) 09/30/2019   HDL 61 09/30/2019   LDLCALC 125 (H) 09/30/2019   TRIG 121 09/30/2019   CHOLHDL 3.4 09/30/2019   No results found for: HGBA1C Lab Results  Component Value Date   VITAMINB12 601 09/09/2018   Lab Results  Component Value Date   TSH 0.61 09/30/2019    ASSESSMENT AND PLAN 85 y.o. year old female  has a past medical history of Back pain, Cancer ([place]), Candidiasis of skin and nails, Cellulitis and abscess of upper arm and forearm, Heartburn, Hemorrhoids (08/20/2016), History of breast cancer (2000), Hurthle cell adenocarcinoma ([place]) (08/20/2016), Hypertension, Insomnia, Knee pain, bilateral (2014), Lymphedema, Memory impairment, Memory loss, Migraine (05/16/2012), Migraine without aura, without mention of intractable migraine without mention of status migrainosus, Other and unspecified hyperlipidemia, Other bursitis disorders, Other infective bursitis, left shoulder, Pain in joint, ankle and foot, Pain in joint, hand, Pain in joint, lower leg, Pain in  joint, shoulder region, Pain in joint, shoulder region (04/23/2016), Pain in limb, Rectal bleeding (08/20/2016), Senile osteoporosis, Symptomatic menopausal or female climacteric states, Syncope and collapse, Unspecified hypertensive heart disease without heart failure (1995), Unspecified hypothyroidism, Unspecified polyarthropathy or polyarthritis, site unspecified, Unspecified urinary incontinence, Unspecified visual loss, and Xerophthalmia. here with:  1.  Progressive memory disturbance  She has been unable to tolerate Aricept, Namenda, or Exelon due to side effect. We discussed retrying some of these medications, but decided to hold off for now. Her memory score shows a slight decline today 24/30. She remains on Prevagen.  I have encouraged her to continue her daily exercise.  She has presented paperwork for Dr. Jannifer Franklin to sign in regards to allowing her husband to manage her financial affairs. She has been unable to access her accounts because she is unable to answer the initial screening questions.  She will follow-up in 6 months or sooner if needed. She is under the primary care of Dr. Lyndel Safe from [facility].   I spent  15 minutes with the patient. 50% of this time was spent discussing her plan of care.  Butler Denmark, AGNP-C, DNP 10/04/2019, 11:40 AM [facility] [address] [place], [place] [address] [phone]

## 2019-10-04 ENCOUNTER — Encounter: Payer: Self-pay | Admitting: Neurology

## 2019-10-04 ENCOUNTER — Telehealth: Payer: Self-pay | Admitting: Neurology

## 2019-10-04 ENCOUNTER — Other Ambulatory Visit: Payer: Self-pay

## 2019-10-04 ENCOUNTER — Ambulatory Visit: Payer: PPO | Admitting: Neurology

## 2019-10-04 VITALS — BP 147/88 | HR 75 | Temp 96.9°F | Ht 63.0 in | Wt 166.6 lb

## 2019-10-04 DIAGNOSIS — R4189 Other symptoms and signs involving cognitive functions and awareness: Secondary | ICD-10-CM | POA: Diagnosis not present

## 2019-10-04 NOTE — Patient Instructions (Signed)
The memory score is 24/30 today. She has been unable to tolerate Aricept, Namenda, or Exelon. You may continue Prevagen. Return in 6 months.

## 2019-10-04 NOTE — Progress Notes (Signed)
I have read the note, and I agree with the clinical assessment and plan.  Elward Nocera K Quentavious Rittenhouse   

## 2019-10-04 NOTE — Telephone Encounter (Signed)
I had the pleasure of seeing [name] and her husband today.  They have presented me with paperwork requesting your signature.  They are asking the paperwork be signed stating that Frances Holland is no longer able to manage her financial affairs or make her own medical decisions.  Her husband has attached a detailed note explaining the situation.  Apparently, her primary doctor, Dr. Lyndel Safe will be signing the paperwork as well.  Her trust indicates that for her husband to become her successor this paperwork must be signed by 2 doctors.  She and her husband have indicated to me today that whenever [name] wanted to make financial transactions, she is unable to because she is unable to answer the preliminary screening questions for her account. Her memory score today was 24/30. She has indicated her wish is for her husband to handle her affairs.

## 2019-10-04 NOTE — Telephone Encounter (Signed)
The letter requires a [employer], this will delay my signature, I would need to make an appointment to get this done.

## 2019-10-06 ENCOUNTER — Other Ambulatory Visit: Payer: Self-pay

## 2019-10-06 ENCOUNTER — Encounter: Payer: Self-pay | Admitting: Internal Medicine

## 2019-10-06 ENCOUNTER — Non-Acute Institutional Stay: Payer: PPO | Admitting: Internal Medicine

## 2019-10-06 VITALS — BP 152/74 | HR 83 | Temp 98.2°F | Ht 63.0 in | Wt 166.2 lb

## 2019-10-06 DIAGNOSIS — E890 Postprocedural hypothyroidism: Secondary | ICD-10-CM | POA: Diagnosis not present

## 2019-10-06 DIAGNOSIS — R4189 Other symptoms and signs involving cognitive functions and awareness: Secondary | ICD-10-CM | POA: Diagnosis not present

## 2019-10-06 DIAGNOSIS — E785 Hyperlipidemia, unspecified: Secondary | ICD-10-CM

## 2019-10-06 DIAGNOSIS — I10 Essential (primary) hypertension: Secondary | ICD-10-CM

## 2019-10-06 DIAGNOSIS — N8111 Cystocele, midline: Secondary | ICD-10-CM | POA: Diagnosis not present

## 2019-10-07 DIAGNOSIS — N8111 Cystocele, midline: Secondary | ICD-10-CM | POA: Insufficient documentation

## 2019-10-07 NOTE — Progress Notes (Signed)
Location:  [place] of Service:  Clinic (12)  Provider:   Code Status:  Goals of Care:  Advanced Directives 03/24/2019  Does Patient Have a Medical Advance Directive? Yes  Type of Paramedic of [place];Out of facility DNR (pink MOST or yellow form)  Does patient want to make changes to medical advance directive? No - Patient declined  Copy of [place] in Chart? -  Pre-existing out of facility DNR order (yellow form or pink MOST form) Pink MOST form placed in chart (order not valid for inpatient use)     Chief Complaint  Patient presents with  . Medical Management of Chronic Issues    4 month follow up. Patient states her weak bladder is her main complaint.   . Health Maintenance    Patient doen't remember if she has gotten her flu shot or not.     HPI: Patient is a 85 y.o. female seen today for medical management of chronic diseases.    Patient has h/o Hypertension, Hypothyroidism, Insomnia and Cognitive impairment. Also Has h/o Skin Cancer and Thyroid Cancer Cognitive Impairment Continue to have worsening of her symptoms Recently saw Neurology Husband is caregiver Independent in [place]  He is taking over [place] Has worked with Speech.  Walks with no assist  Cystocele Monitor per Gynecology Patient doe [place]/o Symptoms But is comfortable right now Hyperlipidemia Doe snot want Statin  Past Medical History:  Diagnosis Date  . Back pain   . Cancer [facility])    left breaset mastectomy   . Candidiasis of skin and nails   . Cellulitis and abscess of upper arm and forearm   . Heartburn   . Hemorrhoids 08/20/2016  . History of breast cancer 2000   History left mastectomy  . 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 Hypertension   . Insomnia   . Knee pain, bilateral 2014  . Lymphedema    Left arm following mastectomy  . Memory impairment   . Memory loss   . Migraine 05/16/2012  . Migraine without aura,  without mention of intractable migraine without mention of status migrainosus    patient denies at preop of 08/08/16   . Other and unspecified hyperlipidemia   . Other bursitis disorders   . Other infective bursitis, left shoulder   . Pain in joint, ankle and foot   . Pain in joint, hand   . Pain in joint, lower leg   . Pain in joint, shoulder region   . Pain in joint, shoulder region 04/23/2016  . Pain in limb   . Rectal bleeding 08/20/2016  . Senile osteoporosis   . Symptomatic menopausal or female climacteric states   . Syncope and collapse   . Unspecified hypertensive heart disease without heart failure 1995  . Unspecified hypothyroidism   . Unspecified polyarthropathy or polyarthritis, site unspecified   . Unspecified urinary incontinence   . Unspecified visual loss   . Xerophthalmia     Past Surgical History:  Procedure Laterality Date  . ABDOMINAL HYSTERECTOMY    . BILATERAL OOPHORECTOMY  1981  . BREAST RECONSTRUCTION Left 01/2000  . BREAST REDUCTION SURGERY Right 01/2000  . CATARACT EXTRACTION Right 09/04/2005  . COLONOSCOPY  01/2004  . MASTECTOMY Left 10/1999  . THYROID LOBECTOMY Left 08/14/2016   Procedure: NEAR TOTAL THYROIDECTOMY;  Surgeon: Frances Hausen, MD;  Location: [facility];  Service: General;  Laterality: Left;  . TOTAL ABDOMINAL HYSTERECTOMY W/ BILATERAL SALPINGOOPHORECTOMY  [place] ARTHROPLASTY Right 2014    Allergies  Allergen Reactions  . Donepezil Hcl     Body pain and stiffness   . Namenda [Memantine Hcl]      Caused body pain   . Sulfa Antibiotics   . Sulfamethoxazole Rash    Outpatient Encounter Medications as of 10/06/2019  Medication Sig  . amLODipine (NORVASC) 2.5 MG tablet TAKE 1 TABLET BY MOUTH ONCE DAILY TO  LOWER  BLOOD  PRESSURE  . amLODipine (NORVASC) 5 MG tablet TAKE 1 TABLET BY MOUTH ONCE DAILY TO  LOWER  BLODD  PRESSURE  . Apoaequorin (PREVAGEN PO) Take by mouth. Regular strength 1 after breakfast  . aspirin EC 81 MG tablet  Take 81 mg by mouth daily.  . Calcium Carb-Cholecalciferol (CALCIUM 600-D PO) Take 1 tablet by mouth daily.  . Cholecalciferol 5000 units capsule Take 5,000 Units by mouth daily.  . hydrochlorothiazide (HYDRODIURIL) 12.5 MG tablet Take 12.5 mg by mouth daily.  Marland Kitchen levothyroxine (SYNTHROID) 112 MCG tablet Take 1 tablet (112 mcg total) by mouth daily before breakfast.  . losartan (COZAAR) 100 MG tablet Take 100 mg by mouth daily.  Marland Kitchen MELATONIN PO Take 3 mg by mouth at bedtime.   . Multiple Vitamins-Minerals (CENTRUM SILVER PO) Take 1 tablet by mouth daily.   . Omega-3 Fatty Acids (OMEGA-3 CF PO) Take 520 mg by mouth daily.   Vladimir Faster Glycol-Propyl Glycol (SYSTANE) 0.4-0.3 % SOLN Place into both eyes. Use two drops of Systane as needed for dry eyes  . losartan-hydrochlorothiazide (HYZAAR) 100-12.5 MG tablet TAKE 1 TABLET BY MOUTH ONCE DAILY IN THE MORNING FOR BLOOD PRESSURE  . [DISCONTINUED] rivastigmine (EXELON) 1.5 MG capsule Take 1 capsule (1.5 mg total) by mouth 2 (two) times daily.   No facility-administered encounter medications on file as of 10/06/2019.     Review of Systems:  Review of Systems  Review of Systems  Constitutional: Negative for activity change, appetite change, chills, diaphoresis, fatigue and fever.  HENT: Negative for mouth sores, postnasal drip, rhinorrhea, sinus pain and sore throat.   Respiratory: Negative for apnea, cough, chest tightness, shortness of breath and wheezing.   Cardiovascular: Negative for chest pain, palpitations and leg swelling.  Gastrointestinal: Negative for abdominal distention, abdominal pain, constipation, diarrhea, nausea and vomiting.  Genitourinary: Negative for dysuria Positive for Incontinence and Frequency Musculoskeletal: Negative for arthralgias, joint swelling and myalgias.  Skin: Negative for rash.  Neurological: Negative for dizziness, syncope, weakness, light-headedness and numbness.  Psychiatric/Behavioral: Negative for  behavioral problems, confusion and sleep disturbance.     Health Maintenance  Topic Date Due  . INFLUENZA VACCINE  07/10/2019  . TETANUS/TDAP  10/30/2027  . DEXA SCAN  Completed  . PNA vac Low Risk Adult  Completed    Physical Exam: Vitals:   10/06/19 1604  BP: (!) 152/74  Pulse: 83  Temp: 98.2 F (36.8 C)  SpO2: 97%  Weight: 166 lb 3.2 oz (75.4 kg)  Height: 5\' 3"  (1.6 m)   Body mass index is 29.44 kg/m. Physical Exam  Constitutional: Oriented to person, place, and time. Well-developed and well-nourished.  HENT:  Head: Normocephalic.  Mouth/Throat: Oropharynx is clear and moist.  Eyes: Pupils are equal, round, and reactive to light.  Neck: Neck supple.  Cardiovascular: Normal rate and normal heart sounds.  No murmur heard. Pulmonary/Chest: Effort normal and breath sounds normal. No respiratory distress. No wheezes. She has no rales.  Abdominal: Soft. Bowel sounds are normal. No distension.  There is no tenderness. There is no rebound.  Musculoskeletal: No edema.  Lymphadenopathy: none Neurological: Alert and oriented to person, place, and time.  Gait stable without assist Skin: Skin is warm and dry.  Psychiatric: Normal mood and affect. Behavior is normal. Thought content normal.    Labs reviewed: Basic Metabolic Panel: Recent Labs    12/31/18 0800 05/27/19 0000 09/30/19 0000  NA 134* 133* 133*  K 4.7 4.6 4.8  CL 98 97* 97*  CO2 26 26 22   GLUCOSE 90 90 94  BUN 16 16 17   CREATININE 0.72 0.76 0.75  CALCIUM 9.7 9.8 9.9  TSH 3.14 0.57 0.61   Liver Function Tests: Recent Labs    12/31/18 0800 05/27/19 0000 09/30/19 0000  AST 18 17 18   ALT 13 13 15   BILITOT 0.4 0.3 0.5  PROT 6.9 6.9 7.0   No results for input(s): LIPASE, AMYLASE in the last 8760 hours. No results for input(s): AMMONIA in the last 8760 hours. CBC: Recent Labs    05/27/19 0000 09/30/19 0000  WBC 6.6 6.0  NEUTROABS 3,914 3,348  HGB 13.2 13.2  HCT 39.0 39.4  MCV 89.9 87.9  PLT  412* 401*   Lipid Panel: Recent Labs    12/31/18 0800 05/27/19 0000 09/30/19 0000  CHOL 209* 205* 209*  HDL 60 57 61  LDLCALC 123* 124* 125*  TRIG 144 129 121  CHOLHDL 3.5 3.6 3.4   No results found for: HGBA1C  Procedures since last visit: No results found.  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 Will discuss about DEXA in Next visit  Labs/tests ordered:  * No order type specified * Next appt:  03/30/2020   Total time spent in this patient care encounter was  _25  minutes; greater than 50% of the visit spent counseling patient and staff, reviewing records , Labs and coordinating care for problems addressed at this encounter.

## 2019-10-20 ENCOUNTER — Telehealth: Payer: Self-pay | Admitting: Neurology

## 2019-10-20 NOTE — Telephone Encounter (Signed)
Letter was in Frances Holland's office. She is going to mail a certified letter to the pt's home address per the husband's request.

## 2019-10-20 NOTE — Telephone Encounter (Signed)
Pt's husband called stating that he is needing a statement from Dr. Jannifer Franklin regarding the pt's disability but states that the signature needs to be notarized. Husband states that if the Oneta Rack is called arrangements can be made with one of the staff from [place] office to come in and notarized it. He gave the number to the Attorney's office of [phone]

## 2019-10-20 NOTE — Telephone Encounter (Signed)
The letter has been signed and notarized, it should be mailed out to the patient.

## 2019-10-26 ENCOUNTER — Telehealth: Payer: Self-pay | Admitting: *Deleted

## 2019-10-26 NOTE — Telephone Encounter (Signed)
Patient husband called and stated that they received a call regarding patient's medications. Medication Adherence. I went through patient's current medication list and everything is correct and no medications are needed at this time.

## 2019-11-26 ENCOUNTER — Ambulatory Visit: Payer: PPO | Admitting: Podiatry

## 2019-12-22 ENCOUNTER — Telehealth: Payer: Self-pay | Admitting: *Deleted

## 2019-12-22 NOTE — Telephone Encounter (Signed)
Pt's husband calling stating pt is taking 4mg  of melatonin and it's not helping her sleep, she is also complaining of groin pain. Husband is asking for an appt, but your schedule is full, I also offered appt at [facility] but husband declined. Please advise

## 2019-12-22 NOTE — Telephone Encounter (Signed)
Called patient to schedule patient to be seen at [facility] clinic tomorrow per Dr. Lyndel Safe. LMOM to return call.

## 2019-12-27 ENCOUNTER — Telehealth: Payer: Self-pay | Admitting: *Deleted

## 2019-12-27 NOTE — Telephone Encounter (Signed)
It should be ok to take Tylenol 6 hours apart. 9 and 3. Just make sure he knows not to exceed 3gms in total 24 hours. Will discuss more when she comes for her Appointment

## 2019-12-27 NOTE — Telephone Encounter (Signed)
Patient husband notified and agreed. Scheduled an earlier appointment for patient to be seen.

## 2019-12-27 NOTE — Telephone Encounter (Signed)
Patient husband, [name] called and stated that patient has been taking Tylenol and it has helped but stated that she is not to take it more than 10 days.  Stated that she takes 2 tablets at 9pm and 3am. Patient did not take it on Friday at 9pm and had a bad night. They are wanting to know if she can still continue to take it at 9pm and 3am. Please Advise.

## 2019-12-29 ENCOUNTER — Encounter: Payer: Self-pay | Admitting: Internal Medicine

## 2019-12-29 ENCOUNTER — Other Ambulatory Visit: Payer: Self-pay

## 2019-12-29 ENCOUNTER — Non-Acute Institutional Stay: Payer: PPO | Admitting: Internal Medicine

## 2019-12-29 VITALS — BP 134/70 | HR 84 | Temp 98.3°F | Ht 63.0 in | Wt 170.4 lb

## 2019-12-29 DIAGNOSIS — F329 Major depressive disorder, single episode, unspecified: Secondary | ICD-10-CM | POA: Diagnosis not present

## 2019-12-29 DIAGNOSIS — F32A Depression, unspecified: Secondary | ICD-10-CM

## 2019-12-29 DIAGNOSIS — G3184 Mild cognitive impairment, so stated: Secondary | ICD-10-CM | POA: Diagnosis not present

## 2019-12-29 DIAGNOSIS — G8929 Other chronic pain: Secondary | ICD-10-CM

## 2019-12-29 DIAGNOSIS — M545 Low back pain, unspecified: Secondary | ICD-10-CM

## 2019-12-29 DIAGNOSIS — R296 Repeated falls: Secondary | ICD-10-CM | POA: Diagnosis not present

## 2019-12-29 NOTE — Progress Notes (Signed)
Location:  [place] of Service:  Clinic (12)  Provider:   Code Status:  Goals of Care:  Advanced Directives 03/24/2019  Does Patient Have a Medical Advance Directive? Yes  Type of Paramedic of [place];Out of facility DNR (pink MOST or yellow form)  Does patient want to make changes to medical advance directive? No - Patient declined  Copy of [place] in Chart? -  Pre-existing out of facility DNR order (yellow form or pink MOST form) Pink MOST form placed in chart (order not valid for inpatient use)     Chief Complaint  Patient presents with  . Medical Management of Chronic Issues    Patient complains of insomnia due to emotional state.    HPI: Patient is a 86 y.o. female seen today for medical management of chronic diseases.   Patient has h/o Hypertension, Hypothyroidism, Insomnia and Cognitive impairment. Also Has h/o Skin Cancer and Thyroid Cancer  She lives with her husband.  In [place] [facility] She just lost her daughter to cancer in 12/01/2023.. One more family member of her recently passed away.  Patient said that she has been feeling depressed and anxious inside and is unable to express herself. Patient has had 3-4 falls since her daughter died in December 01, 2023.  She had suffered a bruise on her chin and has been having pain in her back.  The pain in the back is chronic but has gotten worse since her fall.  Patient is also having difficulty getting up from the chair.  She started using walker.  She also came in a wheelchair as her husband was worried that she would not be able to walk long distance. She has not been sleeping at night.  But recently started taking Tylenol and is doing better with his sleep. Her appetite is still good.  She has actually gained 5 pounds. Denies any nausea vomiting abdominal pain.  No coughing no chest pain. She did Covid Vaccine 2 weeks ago   Past Medical History:  Diagnosis Date  . Back pain     . Cancer [facility])    left breaset mastectomy   . Candidiasis of skin and nails   . Cellulitis and abscess of upper arm and forearm   . Heartburn   . Hemorrhoids 08/20/2016  . History of breast cancer 2000   History left mastectomy  . 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 Hypertension   . Insomnia   . Knee pain, bilateral 2014  . Lymphedema    Left arm following mastectomy  . Memory impairment   . Memory loss   . Migraine 05/16/2012  . Migraine without aura, without mention of intractable migraine without mention of status migrainosus    patient denies at preop of 08/08/16   . Other and unspecified hyperlipidemia   . Other bursitis disorders   . Other infective bursitis, left shoulder   . Pain in joint, ankle and foot   . Pain in joint, hand   . Pain in joint, lower leg   . Pain in joint, shoulder region   . Pain in joint, shoulder region 04/23/2016  . Pain in limb   . Rectal bleeding 08/20/2016  . Senile osteoporosis   . Symptomatic menopausal or female climacteric states   . Syncope and collapse   . Unspecified hypertensive heart disease without heart failure 1995  . Unspecified hypothyroidism   . Unspecified polyarthropathy or polyarthritis, site unspecified   .  Unspecified urinary incontinence   . Unspecified visual loss   . Xerophthalmia     Past Surgical History:  Procedure Laterality Date  . ABDOMINAL HYSTERECTOMY    . BILATERAL OOPHORECTOMY  1981  . BREAST RECONSTRUCTION Left 01/2000  . BREAST REDUCTION SURGERY Right 01/2000  . CATARACT EXTRACTION Right 09/04/2005  . COLONOSCOPY  01/2004  . MASTECTOMY Left 10/1999  . THYROID LOBECTOMY Left 08/14/2016   Procedure: NEAR TOTAL THYROIDECTOMY;  Surgeon: Johnathan Hausen, MD;  Location: [facility];  Service: General;  Laterality: Left;  . TOTAL ABDOMINAL HYSTERECTOMY W/ BILATERAL SALPINGOOPHORECTOMY  1981  . TOTAL KNEE ARTHROPLASTY Right 2014    Allergies  Allergen Reactions  . Donepezil Hcl     Body pain and  stiffness   . Namenda [Memantine Hcl]      Caused body pain   . Sulfa Antibiotics   . Sulfamethoxazole Rash    Outpatient Encounter Medications as of 12/29/2019  Medication Sig  . amLODipine (NORVASC) 2.5 MG tablet TAKE 1 TABLET BY MOUTH ONCE DAILY TO  LOWER  BLOOD  PRESSURE  . amLODipine (NORVASC) 5 MG tablet TAKE 1 TABLET BY MOUTH ONCE DAILY TO  LOWER  BLODD  PRESSURE  . Apoaequorin (PREVAGEN PO) Take by mouth. Regular strength 1 after breakfast  . aspirin EC 81 MG tablet Take 81 mg by mouth daily.  . Calcium Carb-Cholecalciferol (CALCIUM 600-D PO) Take 1 tablet by mouth daily.  . Cholecalciferol 5000 units capsule Take 5,000 Units by mouth daily.  . hydrochlorothiazide (HYDRODIURIL) 12.5 MG tablet Take 12.5 mg by mouth daily.  Marland Kitchen levothyroxine (SYNTHROID) 112 MCG tablet Take 1 tablet (112 mcg total) by mouth daily before breakfast.  . losartan (COZAAR) 100 MG tablet Take 100 mg by mouth daily.  Marland Kitchen losartan-hydrochlorothiazide (HYZAAR) 100-12.5 MG tablet TAKE 1 TABLET BY MOUTH ONCE DAILY IN THE MORNING FOR BLOOD PRESSURE  . MELATONIN PO Take 4 mg by mouth at bedtime.   . Multiple Vitamins-Minerals (CENTRUM SILVER PO) Take 1 tablet by mouth daily.   . Omega-3 Fatty Acids (OMEGA-3 CF PO) Take 520 mg by mouth daily.   Vladimir Faster Glycol-Propyl Glycol (SYSTANE) 0.4-0.3 % SOLN Place into both eyes. Use two drops of Systane as needed for dry eyes   No facility-administered encounter medications on file as of 12/29/2019.    Review of Systems:  Review of Systems  Constitutional: Positive for fatigue.  HENT: Negative.   Respiratory: Negative.   Cardiovascular: Negative.   Gastrointestinal: Negative.   Genitourinary: Negative.   Musculoskeletal: Positive for gait problem.  Neurological: Positive for weakness.  Psychiatric/Behavioral: Positive for confusion, dysphoric mood and sleep disturbance. The patient is nervous/anxious.   All other systems reviewed and are negative.   Health  Maintenance  Topic Date Due  . TETANUS/TDAP  10/30/2027  . INFLUENZA VACCINE  Completed  . DEXA SCAN  Completed  . PNA vac Low Risk Adult  Completed    Physical Exam: Vitals:   12/29/19 1535  BP: 134/70  Pulse: 84  Temp: 98.3 F (36.8 C)  SpO2: 98%  Weight: 170 lb 6.4 oz (77.3 kg)  Height: 5\' 3"  (1.6 m)   Body mass index is 30.19 kg/m. Physical Exam  Constitutional:  Well-developed and well-nourished.  HENT:  Head: Normocephalic.  Mouth/Throat: Oropharynx is clear and moist.  Eyes: Pupils are equal, round, and reactive to light.  Neck: Neck supple.  Cardiovascular: Normal rate and normal heart sounds.  No murmur heard. Pulmonary/Chest: Effort normal and breath sounds  normal. No respiratory distress. No wheezes. She has no rales.  Abdominal: Soft. Bowel sounds are normal. No distension. There is no tenderness. There is no rebound.  Musculoskeletal: No edema. C/o Low Back  MildTenderness Lymphadenopathy: none Neurological:Needed Help to get up from Exam table. But once up was able to walk without Assist. Skin: Skin is warm and dry.  Psychiatric: Distressed for loss of her daughter but was calm   Labs reviewed: Basic Metabolic Panel: Recent Labs    12/31/18 0800 05/27/19 0000 09/30/19 0000  NA 134* 133* 133*  K 4.7 4.6 4.8  CL 98 97* 97*  CO2 26 26 22   GLUCOSE 90 90 94  BUN 16 16 17   CREATININE 0.72 0.76 0.75  CALCIUM 9.7 9.8 9.9  TSH 3.14 0.57 0.61   Liver Function Tests: Recent Labs    12/31/18 0800 05/27/19 0000 09/30/19 0000  AST 18 17 18   ALT 13 13 15   BILITOT 0.4 0.3 0.5  PROT 6.9 6.9 7.0   No results for input(s): LIPASE, AMYLASE in the last 8760 hours. No results for input(s): AMMONIA in the last 8760 hours. CBC: Recent Labs    05/27/19 0000 09/30/19 0000  WBC 6.6 6.0  NEUTROABS 3,914 3,348  HGB 13.2 13.2  HCT 39.0 39.4  MCV 89.9 87.9  PLT 412* 401*   Lipid Panel: Recent Labs    12/31/18 0800 05/27/19 0000 09/30/19 0000  CHOL  209* 205* 209*  HDL 60 57 61  LDLCALC 123* 124* 125*  TRIG 144 129 121  CHOLHDL 3.5 3.6 3.4   No results found for: HGBA1C  Procedures since last visit: No results found.  Assessment/Plan Recurrent falls CBC, CMP Do not see any acute reason. Will have therapy evaluate her  Chronic bilateral low back pain without sciatica Worsening since her fall Needs Xray will wait till next visit Continue Tylenol pRN  Acute depression due to Loss of her daughter Will wait to start her on any new Med due to her falls She is doing little better right now per her husband  Mild cognitive impairment Seen by Neurology MMSE 24/30 Has not tolerated Exelon, Aricept or Namenda    Labs/tests ordered:  * No order type specified * Next appt:  01/12/2020  Total time spent in this patient care encounter was  _40  minutes; greater than 50% of the visit spent counseling patient and staff, reviewing records , Labs and coordinating care for problems addressed at this encounter.

## 2019-12-30 ENCOUNTER — Other Ambulatory Visit: Payer: Self-pay | Admitting: Family

## 2019-12-30 ENCOUNTER — Other Ambulatory Visit: Payer: Self-pay | Admitting: Internal Medicine

## 2019-12-30 NOTE — Telephone Encounter (Signed)
Please see refill, what dose is pt taking?

## 2019-12-31 ENCOUNTER — Encounter: Payer: Self-pay | Admitting: Internal Medicine

## 2019-12-31 ENCOUNTER — Telehealth: Payer: Self-pay | Admitting: *Deleted

## 2019-12-31 DIAGNOSIS — R296 Repeated falls: Secondary | ICD-10-CM | POA: Diagnosis not present

## 2019-12-31 NOTE — Telephone Encounter (Signed)
Patient husband notified and agreed. Stated that patient has an appointment with Dr. Lyndel Safe on 2/3 and they will wait till then.

## 2019-12-31 NOTE — Telephone Encounter (Signed)
No I would not recommend increasing the Melatonin more then this due to side effects and it does not work at that dose. Can she come and see Manxi on Tues ? I can discuss her with Manxi

## 2019-12-31 NOTE — Telephone Encounter (Signed)
Patient husband, Daria Pastures called and stated that they forgot to ask when they were seen on 1/20 about increasing patient's Melatonin at bedtime. Stated that she is currently taking 4mg  and it is not working, stating that she is having bad nights. Wants to increase to help her rest. Please Advise.

## 2019-12-31 NOTE — Telephone Encounter (Signed)
She should be on Norvasc 7.5 mg QD

## 2020-01-03 ENCOUNTER — Other Ambulatory Visit: Payer: Self-pay

## 2020-01-03 ENCOUNTER — Telehealth: Payer: Self-pay | Admitting: *Deleted

## 2020-01-03 DIAGNOSIS — I10 Essential (primary) hypertension: Secondary | ICD-10-CM

## 2020-01-03 DIAGNOSIS — E890 Postprocedural hypothyroidism: Secondary | ICD-10-CM

## 2020-01-03 DIAGNOSIS — R296 Repeated falls: Secondary | ICD-10-CM

## 2020-01-03 LAB — CBC WITH DIFFERENTIAL/PLATELET
Absolute Monocytes: 636 cells/uL (ref 200–950)
Basophils Absolute: 60 cells/uL (ref 0–200)
Basophils Relative: 1 %
Eosinophils Absolute: 240 cells/uL (ref 15–500)
Eosinophils Relative: 4 %
HCT: 35.7 % (ref 35.0–45.0)
Hemoglobin: 11.8 g/dL (ref 11.7–15.5)
Lymphs Abs: 1758 cells/uL (ref 850–3900)
MCH: 29.8 pg (ref 27.0–33.0)
MCHC: 33.1 g/dL (ref 32.0–36.0)
MCV: 90.2 fL (ref 80.0–100.0)
MPV: 10.1 fL (ref 7.5–12.5)
Monocytes Relative: 10.6 %
Neutro Abs: 3306 cells/uL (ref 1500–7800)
Neutrophils Relative %: 55.1 %
Platelets: 448 10*3/uL — ABNORMAL HIGH (ref 140–400)
RBC: 3.96 10*6/uL (ref 3.80–5.10)
RDW: 12.8 % (ref 11.0–15.0)
Total Lymphocyte: 29.3 %
WBC: 6 10*3/uL (ref 3.8–10.8)

## 2020-01-03 LAB — COMPLETE METABOLIC PANEL WITH GFR
AG Ratio: 1.4 (calc) (ref 1.0–2.5)
ALT: 17 U/L (ref 6–29)
AST: 17 U/L (ref 10–35)
Albumin: 3.9 g/dL (ref 3.6–5.1)
Alkaline phosphatase (APISO): 70 U/L (ref 37–153)
BUN: 18 mg/dL (ref 7–25)
CO2: 26 mmol/L (ref 20–32)
Calcium: 9.4 mg/dL (ref 8.6–10.4)
Chloride: 96 mmol/L — ABNORMAL LOW (ref 98–110)
Creat: 0.71 mg/dL (ref 0.60–0.88)
GFR, Est African American: 88 mL/min/{1.73_m2} (ref >=60)
GFR, Est Non African American: 76 mL/min/{1.73_m2} (ref >=60)
Globulin: 2.8 g/dL (calc) (ref 1.9–3.7)
Glucose, Bld: 92 mg/dL (ref 65–99)
Potassium: 4.7 mmol/L (ref 3.5–5.3)
Sodium: 131 mmol/L — ABNORMAL LOW (ref 135–146)
Total Bilirubin: 0.4 mg/dL (ref 0.2–1.2)
Total Protein: 6.7 g/dL (ref 6.1–8.1)

## 2020-01-03 LAB — TSH: TSH: 8.52 mIU/L — ABNORMAL HIGH (ref 0.40–4.50)

## 2020-01-03 NOTE — Telephone Encounter (Signed)
Patient husband, [name] called and stated that patient is having a lot of pain when getting up from sitting or lying. Wants to know if there is anything stronger than Tylenol she can take or if her Tylenol can be Increased. Patient is currently taking Tylenol Extra Strength 2 at 9pm and 2 at 3am. Please Advise.   (patient has an appointment on 01/12/20 with you at [facility])

## 2020-01-04 DIAGNOSIS — R2689 Other abnormalities of gait and mobility: Secondary | ICD-10-CM | POA: Diagnosis not present

## 2020-01-04 DIAGNOSIS — R296 Repeated falls: Secondary | ICD-10-CM | POA: Diagnosis not present

## 2020-01-04 DIAGNOSIS — R2681 Unsteadiness on feet: Secondary | ICD-10-CM | POA: Diagnosis not present

## 2020-01-04 DIAGNOSIS — M545 Low back pain: Secondary | ICD-10-CM | POA: Diagnosis not present

## 2020-01-04 MED ORDER — MELOXICAM 7.5 MG PO TABS: 7.5000 mg | ORAL_TABLET | Freq: Every day | ORAL | 0 refills | Status: DC

## 2020-01-04 NOTE — Addendum Note (Signed)
Addended by: Rafael Bihari A on: 01/04/2020 10:56 AM   Modules accepted: Orders

## 2020-01-04 NOTE — Telephone Encounter (Signed)
Let him know that her Labs look Good. Thyroid is little Low. Make sure she takes her thyroid Med properly. Also For Pain I Can call in Meloxicam 7.5 Mg QD for 2 weeks to see if it helps her Pain.

## 2020-01-04 NOTE — Telephone Encounter (Signed)
Patient husband notified and agreed.  Rx faxed to pharmacy.

## 2020-01-10 DIAGNOSIS — R296 Repeated falls: Secondary | ICD-10-CM | POA: Diagnosis not present

## 2020-01-10 DIAGNOSIS — M545 Low back pain: Secondary | ICD-10-CM | POA: Diagnosis not present

## 2020-01-10 DIAGNOSIS — R2689 Other abnormalities of gait and mobility: Secondary | ICD-10-CM | POA: Diagnosis not present

## 2020-01-10 DIAGNOSIS — N3946 Mixed incontinence: Secondary | ICD-10-CM | POA: Diagnosis not present

## 2020-01-10 DIAGNOSIS — R2681 Unsteadiness on feet: Secondary | ICD-10-CM | POA: Diagnosis not present

## 2020-01-12 ENCOUNTER — Non-Acute Institutional Stay: Payer: PPO | Admitting: Internal Medicine

## 2020-01-12 ENCOUNTER — Other Ambulatory Visit: Payer: Self-pay

## 2020-01-12 ENCOUNTER — Encounter: Payer: Self-pay | Admitting: Internal Medicine

## 2020-01-12 VITALS — BP 132/70 | HR 82 | Temp 98.1°F | Ht 63.0 in | Wt 173.6 lb

## 2020-01-12 DIAGNOSIS — M545 Low back pain: Secondary | ICD-10-CM

## 2020-01-12 DIAGNOSIS — F32A Depression, unspecified: Secondary | ICD-10-CM

## 2020-01-12 DIAGNOSIS — R32 Unspecified urinary incontinence: Secondary | ICD-10-CM

## 2020-01-12 DIAGNOSIS — F329 Major depressive disorder, single episode, unspecified: Secondary | ICD-10-CM

## 2020-01-12 DIAGNOSIS — G3184 Mild cognitive impairment, so stated: Secondary | ICD-10-CM | POA: Diagnosis not present

## 2020-01-12 DIAGNOSIS — G8929 Other chronic pain: Secondary | ICD-10-CM

## 2020-01-12 DIAGNOSIS — I10 Essential (primary) hypertension: Secondary | ICD-10-CM

## 2020-01-12 DIAGNOSIS — E785 Hyperlipidemia, unspecified: Secondary | ICD-10-CM

## 2020-01-12 DIAGNOSIS — R296 Repeated falls: Secondary | ICD-10-CM | POA: Diagnosis not present

## 2020-01-12 MED ORDER — MELOXICAM 7.5 MG PO TABS: 7.5000 mg | ORAL_TABLET | Freq: Every day | ORAL | 0 refills | Status: AC | PRN

## 2020-01-12 NOTE — Progress Notes (Signed)
Location: [place] of Service:  Clinic (12)  Provider:   Code Status:  Goals of Care:  Advanced Directives 03/24/2019  Does Patient Have a Medical Advance Directive? Yes  Type of Paramedic of [place];Out of facility DNR (pink MOST or yellow form)  Does patient want to make changes to medical advance directive? No - Patient declined  Copy of [place] in Chart? -  Pre-existing out of facility DNR order (yellow form or pink MOST form) Pink MOST form placed in chart (order not valid for inpatient use)     Chief Complaint  Patient presents with  . Medical Management of Chronic Issues    2 week follow up. Sleep is better.    HPI: Patient is a 86 y.o. female seen today for an acute visit for Follow up of her falls  Patient has h/o Hypertension, Hypothyroidism, Insomnia and Cognitive impairment. Also Has h/o Skin Cancer and Thyroid Cancer  She lives with her husband.  In [place] in [facility] She just lost her daughter to cancer in Nov.. One more family member of her recently passed away.Working  Recurent falls Was seen last visit after 2 falls Is working with therapy. Has not had any new falls. 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 Does onto think she needs any Antidepressant Sleeping good with Melatonin  Lower Back pain Much improved with Meloxicam  Cognitive Impairnment Managing good per her husband C/O Urinary Frequency and Urgency    Past Medical History:  Diagnosis Date  . Back pain   . Cancer [facility])    left breaset mastectomy   . Candidiasis of skin and nails   . Cellulitis and abscess of upper arm and forearm   . Heartburn   . Hemorrhoids 08/20/2016  . History of breast cancer 2000   History left mastectomy  . 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 Hypertension   . Insomnia   . Knee pain, bilateral 2014  . Lymphedema    Left arm following  mastectomy  . Memory impairment   . Memory loss   . Migraine 05/16/2012  . Migraine without aura, without mention of intractable migraine without mention of status migrainosus    patient denies at preop of 08/08/16   . Other and unspecified hyperlipidemia   . Other bursitis disorders   . Other infective bursitis, left shoulder   . Pain in joint, ankle and foot   . Pain in joint, hand   . Pain in joint, lower leg   . Pain in joint, shoulder region   . Pain in joint, shoulder region 04/23/2016  . Pain in limb   . Rectal bleeding 08/20/2016  . Senile osteoporosis   . Symptomatic menopausal or female climacteric states   . Syncope and collapse   . Unspecified hypertensive heart disease without heart failure 1995  . Unspecified hypothyroidism   . Unspecified polyarthropathy or polyarthritis, site unspecified   . Unspecified urinary incontinence   . Unspecified visual loss   . Xerophthalmia     Past Surgical History:  Procedure Laterality Date  . ABDOMINAL HYSTERECTOMY    . BILATERAL OOPHORECTOMY  1981  . BREAST RECONSTRUCTION Left 01/2000  . BREAST REDUCTION SURGERY Right 01/2000  . CATARACT EXTRACTION Right 09/04/2005  . COLONOSCOPY  01/2004  . MASTECTOMY Left 10/1999  . THYROID LOBECTOMY Left 08/14/2016   Procedure: NEAR TOTAL THYROIDECTOMY;  Surgeon: Johnathan Hausen,  MD;  Location: [facility];  Service: General;  Laterality: Left;  . TOTAL ABDOMINAL HYSTERECTOMY W/ BILATERAL SALPINGOOPHORECTOMY  1981  . TOTAL KNEE ARTHROPLASTY Right 2014    Allergies  Allergen Reactions  . Donepezil Hcl     Body pain and stiffness   . Namenda [Memantine Hcl]      Caused body pain   . Sulfa Antibiotics   . Sulfamethoxazole Rash    Outpatient Encounter Medications as of 01/12/2020  Medication Sig  . amLODipine (NORVASC) 2.5 MG tablet TAKE 1 TABLET BY MOUTH ONCE DAILY FOR LOWER BLOOD PRESSURE (Patient taking differently: Take 2.5 mg by mouth daily. Take with the 5mg  for a total of 7.5mg )  .  amLODipine (NORVASC) 5 MG tablet TAKE 1 TABLET BY MOUTH ONCE DAILY TO LOWER BLOOD PRESSURE (Patient taking differently: Take 5 mg by mouth daily. Take with the 2.5mg  for a total of 7.5mg )  . Apoaequorin (PREVAGEN PO) Take by mouth. Regular strength 1 after breakfast  . aspirin EC 81 MG tablet Take 81 mg by mouth daily.  . Calcium Carb-Cholecalciferol (CALCIUM 600-D PO) Take 1 tablet by mouth daily.  . Cholecalciferol 5000 units capsule Take 5,000 Units by mouth daily.  . hydrochlorothiazide (HYDRODIURIL) 12.5 MG tablet Take 12.5 mg by mouth daily.  Marland Kitchen levothyroxine (SYNTHROID) 112 MCG tablet Take 1 tablet (112 mcg total) by mouth daily before breakfast.  . losartan (COZAAR) 100 MG tablet Take 100 mg by mouth daily.  Marland Kitchen MELATONIN PO Take 4 mg by mouth at bedtime.   . Multiple Vitamins-Minerals (CENTRUM SILVER PO) Take 1 tablet by mouth daily.   . Omega-3 Fatty Acids (OMEGA-3 CF PO) Take 520 mg by mouth daily.   Vladimir Faster Glycol-Propyl Glycol (SYSTANE) 0.4-0.3 % SOLN Place into both eyes. Use two drops of Systane as needed for dry eyes  . [DISCONTINUED] meloxicam (MOBIC) 7.5 MG tablet Take 1 tablet (7.5 mg total) by mouth daily.  . meloxicam (MOBIC) 7.5 MG tablet Take 1 tablet (7.5 mg total) by mouth daily as needed for up to 15 days for pain. Take it with food as needed for moderate Pain   No facility-administered encounter medications on file as of 01/12/2020.    Review of Systems:  Review of Systems  10 System reviewed. All negative except mentioned in Presenting Complain  Health Maintenance  Topic Date Due  . TETANUS/TDAP  10/30/2027  . INFLUENZA VACCINE  Completed  . DEXA SCAN  Completed  . PNA vac Low Risk Adult  Completed    Physical Exam: Vitals:   01/12/20 1306  BP: 132/70  Pulse: 82  Temp: 98.1 F (36.7 C)  SpO2: 97%  Weight: 173 lb 9.6 oz (78.7 kg)  Height: 5\' 3"  (1.6 m)   Body mass index is 30.75 kg/m. Physical Exam  Constitutional: Well-developed and  well-nourished.  HENT:  Head: Normocephalic.  Mouth/Throat: Oropharynx is clear and moist.  Eyes: Pupils are equal, round, and reactive to light.  Neck: Neck supple.  Cardiovascular: Normal rate and normal heart sounds.  No murmur heard. Pulmonary/Chest: Effort normal and breath sounds normal. No respiratory distress. No wheezes. She has no rales.  Abdominal: Soft. Bowel sounds are normal. No distension. There is no tenderness. There is no rebound.  Musculoskeletal: No edema. Mild Low Back Tender Lymphadenopathy: none Neurological:Able to get up by herself today Walking with walker Skin: Skin is warm and dry.  Psychiatric: Normal mood and affect. Behavior is normal. Thought content normal.    Labs reviewed:  Basic Metabolic Panel: Recent Labs    05/27/19 0000 09/30/19 0000 12/31/19 0901  NA 133* 133* 131*  K 4.6 4.8 4.7  CL 97* 97* 96*  CO2 26 22 26   GLUCOSE 90 94 92  BUN 16 17 18   CREATININE 0.76 0.75 0.71  CALCIUM 9.8 9.9 9.4  TSH 0.57 0.61 8.52*   Liver Function Tests: Recent Labs    05/27/19 0000 09/30/19 0000 12/31/19 0901  AST 17 18 17   ALT 13 15 17   BILITOT 0.3 0.5 0.4  PROT 6.9 7.0 6.7   No results for input(s): LIPASE, AMYLASE in the last 8760 hours. No results for input(s): AMMONIA in the last 8760 hours. CBC: Recent Labs    05/27/19 0000 09/30/19 0000 12/31/19 0901  WBC 6.6 6.0 6.0  NEUTROABS 3,914 3,348 3,306  HGB 13.2 13.2 11.8  HCT 39.0 39.4 35.7  MCV 89.9 87.9 90.2  PLT 412* 401* 448*   Lipid Panel: Recent Labs    05/27/19 0000 09/30/19 0000  CHOL 205* 209*  HDL 57 61  LDLCALC 124* 125*  TRIG 129 121  CHOLHDL 3.6 3.4   No results found for: HGBA1C  Procedures since last visit: No results found.  Assessment/Plan 1. Urinary incontinence, unspecified type  - OT eval and treat; Future For Bladder Training Does have history of Cystocele Symptoms are Chronic Wants to try OT for Bladder training Consider Urology Consult next  visit  Recurrent falls No Falls recently Working with therapy  Chronic bilateral low back pain without sciatica Will use Meloxicam Prn for another 2 weeks  Acute depression due to Loss of her daughter Doing well Does not want Antidepressant right now Continue Melatonin for Insomnia Mild cognitive impairment Seen by Neurology before MMSE 24/30 Has not tolerated Exelon, Aricept or Namenda Essential hypertension ON Cozaar ,Norvasc and HCZ Hyperlipidemia, unspecified hyperlipidemia type Has refused statin Hypothyroidism TSH high Will repeat in 2 months ? Missed doses    Labs/tests ordered:  * No order type specified * Next appt:  03/30/2020

## 2020-02-07 DIAGNOSIS — M545 Low back pain: Secondary | ICD-10-CM | POA: Diagnosis not present

## 2020-02-07 DIAGNOSIS — R296 Repeated falls: Secondary | ICD-10-CM | POA: Diagnosis not present

## 2020-02-07 DIAGNOSIS — N3946 Mixed incontinence: Secondary | ICD-10-CM | POA: Diagnosis not present

## 2020-02-07 DIAGNOSIS — R2681 Unsteadiness on feet: Secondary | ICD-10-CM | POA: Diagnosis not present

## 2020-02-07 DIAGNOSIS — R2689 Other abnormalities of gait and mobility: Secondary | ICD-10-CM | POA: Diagnosis not present

## 2020-02-16 ENCOUNTER — Non-Acute Institutional Stay: Payer: PPO | Admitting: Internal Medicine

## 2020-02-16 ENCOUNTER — Other Ambulatory Visit: Payer: Self-pay

## 2020-02-16 ENCOUNTER — Encounter: Payer: Self-pay | Admitting: Internal Medicine

## 2020-02-16 VITALS — BP 126/72 | HR 77 | Temp 97.9°F | Ht 63.0 in | Wt 168.4 lb

## 2020-02-16 DIAGNOSIS — M79604 Pain in right leg: Secondary | ICD-10-CM | POA: Diagnosis not present

## 2020-02-16 DIAGNOSIS — R635 Abnormal weight gain: Secondary | ICD-10-CM

## 2020-02-16 DIAGNOSIS — F329 Major depressive disorder, single episode, unspecified: Secondary | ICD-10-CM | POA: Diagnosis not present

## 2020-02-16 DIAGNOSIS — I10 Essential (primary) hypertension: Secondary | ICD-10-CM | POA: Diagnosis not present

## 2020-02-16 DIAGNOSIS — F32A Depression, unspecified: Secondary | ICD-10-CM

## 2020-02-16 DIAGNOSIS — R4189 Other symptoms and signs involving cognitive functions and awareness: Secondary | ICD-10-CM

## 2020-02-16 NOTE — Progress Notes (Signed)
Location: [place] of Service:  Clinic (12)  Provider:   Code Status:  Goals of Care:  Advanced Directives 03/24/2019  Does Patient Have a Medical Advance Directive? Yes  Type of Paramedic of [place];Out of facility DNR (pink MOST or yellow form)  Does patient want to make changes to medical advance directive? No - Patient declined  Copy of [place] in Chart? -  Pre-existing out of facility DNR order (yellow form or pink MOST form) Pink MOST form placed in chart (order not valid for inpatient use)     Chief Complaint  Patient presents with  . Acute Visit    At night right waist side down throughout leg.    HPI: Patient is a 86 y.o. female seen today for an acute visit for Pain In her Right Leg  Patient has h/o Hypertension, Hypothyroidism, Insomnia and Cognitive impairment. Also Has h/o Skin Cancer and Thyroid Cancer  Patient did have fall few Months ago and was seen for Lower Back pain. Had therapy.  But now she is c/o Right Leg Pain. Pain is mostly in front of her Right thigh. Also some in Groin area. She states that the pain is not acute and has been there for many weeks.  But she thinks is getting worse Patient has gained some weight recently as per her Husband she has been eating well and not walking just sits in her Apartment. No recent Falls. Is using her walker more frequently and states she is not able to walk for long distances due to being deconditioned. She lost her daughter to cancer in November of last year.  She has been doing well  Past Medical History:  Diagnosis Date  . Back pain   . Cancer [facility])    left breaset mastectomy   . Candidiasis of skin and nails   . Cellulitis and abscess of upper arm and forearm   . Heartburn   . Hemorrhoids 08/20/2016  . History of breast cancer 2000   History left mastectomy  . 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 Hypertension   . Insomnia   .  Knee pain, bilateral 2014  . Lymphedema    Left arm following mastectomy  . Memory impairment   . Memory loss   . Migraine 05/16/2012  . Migraine without aura, without mention of intractable migraine without mention of status migrainosus    patient denies at preop of 08/08/16   . Other and unspecified hyperlipidemia   . Other bursitis disorders   . Other infective bursitis, left shoulder   . Pain in joint, ankle and foot   . Pain in joint, hand   . Pain in joint, lower leg   . Pain in joint, shoulder region   . Pain in joint, shoulder region 04/23/2016  . Pain in limb   . Rectal bleeding 08/20/2016  . Senile osteoporosis   . Symptomatic menopausal or female climacteric states   . Syncope and collapse   . Unspecified hypertensive heart disease without heart failure 1995  . Unspecified hypothyroidism   . Unspecified polyarthropathy or polyarthritis, site unspecified   . Unspecified urinary incontinence   . Unspecified visual loss   . Xerophthalmia     Past Surgical History:  Procedure Laterality Date  . ABDOMINAL HYSTERECTOMY    . BILATERAL OOPHORECTOMY  1981  . BREAST RECONSTRUCTION Left 01/2000  . BREAST REDUCTION SURGERY Right 01/2000  . CATARACT EXTRACTION Right 09/04/2005  .  COLONOSCOPY  01/2004  . MASTECTOMY Left 10/1999  . THYROID LOBECTOMY Left 08/14/2016   Procedure: NEAR TOTAL THYROIDECTOMY;  Surgeon: Johnathan Hausen, MD;  Location: [facility];  Service: General;  Laterality: Left;  . TOTAL ABDOMINAL HYSTERECTOMY W/ BILATERAL SALPINGOOPHORECTOMY  1981  . TOTAL KNEE ARTHROPLASTY Right 2014    Allergies  Allergen Reactions  . Donepezil Hcl     Body pain and stiffness   . Namenda [Memantine Hcl]      Caused body pain   . Sulfa Antibiotics   . Sulfamethoxazole Rash    Outpatient Encounter Medications as of 02/16/2020  Medication Sig  . acetaminophen (TYLENOL) 325 MG tablet Take 650 mg by mouth every 6 (six) hours as needed.  Marland Kitchen amLODipine (NORVASC) 2.5 MG tablet TAKE 1  TABLET BY MOUTH ONCE DAILY FOR LOWER BLOOD PRESSURE (Patient taking differently: Take 2.5 mg by mouth daily. Take with the 5mg  for a total of 7.5mg )  . amLODipine (NORVASC) 5 MG tablet TAKE 1 TABLET BY MOUTH ONCE DAILY TO LOWER BLOOD PRESSURE (Patient taking differently: Take 5 mg by mouth daily. Take with the 2.5mg  for a total of 7.5mg )  . Apoaequorin (PREVAGEN PO) Take by mouth. Regular strength 1 after breakfast  . aspirin EC 81 MG tablet Take 81 mg by mouth daily.  . Calcium Carb-Cholecalciferol (CALCIUM 600-D PO) Take 1 tablet by mouth daily.  . Cholecalciferol 5000 units capsule Take 5,000 Units by mouth daily.  . hydrochlorothiazide (HYDRODIURIL) 12.5 MG tablet Take 12.5 mg by mouth daily.  Marland Kitchen levothyroxine (SYNTHROID) 112 MCG tablet Take 1 tablet (112 mcg total) by mouth daily before breakfast.  . losartan (COZAAR) 100 MG tablet Take 100 mg by mouth daily.  Marland Kitchen MELATONIN PO Take 4 mg by mouth at bedtime.   . Multiple Vitamins-Minerals (CENTRUM SILVER PO) Take 1 tablet by mouth daily.   . Omega-3 Fatty Acids (OMEGA-3 CF PO) Take 520 mg by mouth daily.   Vladimir Faster Glycol-Propyl Glycol (SYSTANE) 0.4-0.3 % SOLN Place into both eyes. Use two drops of Systane as needed for dry eyes   No facility-administered encounter medications on file as of 02/16/2020.    Review of Systems:  Review of Systems  Respiratory: Negative.   Cardiovascular: Negative.   Gastrointestinal: Negative.   Genitourinary: Positive for frequency and urgency.  Musculoskeletal: Positive for arthralgias, gait problem and myalgias.  Skin: Negative.   Psychiatric/Behavioral: Positive for confusion and sleep disturbance.  All other systems reviewed and are negative.   Health Maintenance  Topic Date Due  . TETANUS/TDAP  10/30/2027  . INFLUENZA VACCINE  Completed  . DEXA SCAN  Completed  . PNA vac Low Risk Adult  Completed    Physical Exam: Vitals:   02/16/20 1429  Pulse: 77  Temp: 97.9 F (36.6 C)  SpO2: 95%   Weight: 168 lb 6.4 oz (76.4 kg)  Height: 5\' 3"  (1.6 m)   Body mass index is 29.83 kg/m. Physical Exam  Constitutional:  Well-developed and well-nourished.  HENT:  Head: Normocephalic.  Mouth/Throat: Oropharynx is clear and moist.  Eyes: Pupils are equal, round, and reactive to light.  Neck: Neck supple.  Cardiovascular: Normal rate and normal heart sounds.  No murmur heard. Pulmonary/Chest: Effort normal and breath sounds normal. No respiratory distress. No wheezes. She has no rales.  Abdominal: Soft. Bowel sounds are normal. No distension. There is no tenderness. There is no rebound.  Musculoskeletal: No edema.  Patient needed Help with Gilford Rile to stand  From the exam  table  She was walking she is Limping on the right side but denied any pain.  She did not have any pain on straight leg or extension or any abduction and  Adduction of Hip  Lymphadenopathy: none Neurological: Alert and oriented to person, place, and time.  Skin: Skin is warm and dry.  Psychiatric: Normal mood and affect. Behavior is normal. Thought content normal.    Labs reviewed: Basic Metabolic Panel: Recent Labs    05/27/19 0000 09/30/19 0000 12/31/19 0901  NA 133* 133* 131*  K 4.6 4.8 4.7  CL 97* 97* 96*  CO2 26 22 26   GLUCOSE 90 94 92  BUN 16 17 18   CREATININE 0.76 0.75 0.71  CALCIUM 9.8 9.9 9.4  TSH 0.57 0.61 8.52*   Liver Function Tests: Recent Labs    05/27/19 0000 09/30/19 0000 12/31/19 0901  AST 17 18 17   ALT 13 15 17   BILITOT 0.3 0.5 0.4  PROT 6.9 7.0 6.7   No results for input(s): LIPASE, AMYLASE in the last 8760 hours. No results for input(s): AMMONIA in the last 8760 hours. CBC: Recent Labs    05/27/19 0000 09/30/19 0000 12/31/19 0901  WBC 6.6 6.0 6.0  NEUTROABS 3,914 3,348 3,306  HGB 13.2 13.2 11.8  HCT 39.0 39.4 35.7  MCV 89.9 87.9 90.2  PLT 412* 401* 448*   Lipid Panel: Recent Labs    05/27/19 0000 09/30/19 0000  CHOL 205* 209*  HDL 57 61  LDLCALC 124* 125*   TRIG 129 121  CHOLHDL 3.6 3.4   No results found for: HGBA1C  Procedures since last visit: No results found.  Assessment/Plan Right leg pain ?Muscular or Arthritic Pain ? Sciatic Pain though Denies Back pain now Use Tylenol Prn, Meloxicam PRN Heating Pad and Aspercreme Xray Ordered Ortho Consult if Pain not better  Essential hypertension Controlled on Norvasc and Hyzaar  Acute depression due to Loss of her daughter Doing well Denies any need for any antidepressnats  Cognitive impairment Seen by Neurology MMSE 24/30 Has not tolerated Exelon, Aricept or Namenda Chronic bilateral low back pain without sciatica Per Patient her pain is completly resolved now  Other Issues  Urinary incontinence, unspecified type OT eval and treat; Future For Bladder Training Does have history of Cystocele Symptoms are Chronic Wants to try OT for Bladder training Consider Urology Consult next visit  Recurrent falls No Falls recently Worked With Therapy Hyperlipidemia, unspecified hyperlipidemia type Has refused statin Hypothyroidism TSH high Will repeat in 2 months ? Missed doses  Labs/tests ordered:  * No order type specified * Next appt:  03/30/2020  Total time spent in this patient care encounter was  45_  minutes; greater than 50% of the visit spent counseling patient and staff, reviewing records , Labs and coordinating care for problems addressed at this encounter.

## 2020-02-28 ENCOUNTER — Telehealth: Payer: Self-pay | Admitting: *Deleted

## 2020-02-28 NOTE — Telephone Encounter (Signed)
Patient husband, [name] called and stated that patient was on the commode Saturday morning and passed out. Security and nurse, Shirlean Mylar helped her to bed. After lying there for awhile vital were normal. They decided not to go to the [place] wanted husband to call you and make you aware.

## 2020-02-29 NOTE — Telephone Encounter (Signed)
LMOM for patient to return call.

## 2020-02-29 NOTE — Telephone Encounter (Signed)
Appointment scheduled with Dr. Lyndel Safe 03/08/2020

## 2020-02-29 NOTE — Telephone Encounter (Signed)
She needs to come see me or [place] for follow up in the Clinic.

## 2020-03-01 DIAGNOSIS — E7849 Other hyperlipidemia: Secondary | ICD-10-CM | POA: Diagnosis not present

## 2020-03-01 DIAGNOSIS — F418 Other specified anxiety disorders: Secondary | ICD-10-CM | POA: Diagnosis not present

## 2020-03-01 DIAGNOSIS — I10 Essential (primary) hypertension: Secondary | ICD-10-CM | POA: Diagnosis not present

## 2020-03-01 DIAGNOSIS — G3184 Mild cognitive impairment, so stated: Secondary | ICD-10-CM | POA: Diagnosis not present

## 2020-03-01 DIAGNOSIS — E871 Hypo-osmolality and hyponatremia: Secondary | ICD-10-CM | POA: Diagnosis not present

## 2020-03-01 DIAGNOSIS — Z1331 Encounter for screening for depression: Secondary | ICD-10-CM | POA: Diagnosis not present

## 2020-03-01 DIAGNOSIS — E890 Postprocedural hypothyroidism: Secondary | ICD-10-CM | POA: Diagnosis not present

## 2020-03-01 DIAGNOSIS — M810 Age-related osteoporosis without current pathological fracture: Secondary | ICD-10-CM | POA: Diagnosis not present

## 2020-03-01 DIAGNOSIS — R296 Repeated falls: Secondary | ICD-10-CM | POA: Diagnosis not present

## 2020-03-01 DIAGNOSIS — C73 Malignant neoplasm of thyroid gland: Secondary | ICD-10-CM | POA: Diagnosis not present

## 2020-03-08 ENCOUNTER — Non-Acute Institutional Stay: Payer: PPO | Admitting: Internal Medicine

## 2020-03-08 ENCOUNTER — Encounter: Payer: Self-pay | Admitting: Internal Medicine

## 2020-03-08 ENCOUNTER — Other Ambulatory Visit: Payer: Self-pay

## 2020-03-08 VITALS — Temp 98.4°F | Ht 63.0 in | Wt 165.2 lb

## 2020-03-08 DIAGNOSIS — R55 Syncope and collapse: Secondary | ICD-10-CM | POA: Diagnosis not present

## 2020-03-08 NOTE — Progress Notes (Signed)
Location: [place] of Service:  Clinic (12)  Provider:   Code Status: Goals of Care:  Advanced Directives 03/24/2019  Does Patient Have a Medical Advance Directive? Yes  Type of Paramedic of [place];Out of facility DNR (pink MOST or yellow form)  Does patient want to make changes to medical advance directive? No - Patient declined  Copy of [place] in Chart? -  Pre-existing out of facility DNR order (yellow form or pink MOST form) Pink MOST form placed in chart (order not valid for inpatient use)     Chief Complaint  Patient presents with  . Acute Visit    Follow up from passing out 3/20.    HPI: Patient is a 86 y.o. female seen today for an acute visit for visit for Passing out Few weeks ago Patient has h/o Hypertension, Hypothyroidism, Insomnia and Cognitive impairment. Also Has h/o Skin Cancer and Thyroid Cancer  Patient had an Episode where she passed out in her apartment. She is unable to give history. Due to her dementia it is hard for her to recall all events but per her husband she was on Commode when she called his name and when he got there she started mumbling something and then passed out. He called for help and Nurse came and put her on the bed. She came back and was back to her baseline.With Stable Vitals They refused to go to ED. She denies any chest pain or pressure.  No Diaphoresis. No seizures. No other focal Deficit. No Fever. Since then Patient has not had any episodes and has been at her baseline.   Past Medical History:  Diagnosis Date  . Back pain   . Cancer [facility])    left breaset mastectomy   . Candidiasis of skin and nails   . Cellulitis and abscess of upper arm and forearm   . Heartburn   . Hemorrhoids 08/20/2016  . History of breast cancer 2000   History left mastectomy  . 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 Hypertension   . Insomnia   . Knee pain, bilateral 2014  .  Lymphedema    Left arm following mastectomy  . Memory impairment   . Memory loss   . Migraine 05/16/2012  . Migraine without aura, without mention of intractable migraine without mention of status migrainosus    patient denies at preop of 08/08/16   . Other and unspecified hyperlipidemia   . Other bursitis disorders   . Other infective bursitis, left shoulder   . Pain in joint, ankle and foot   . Pain in joint, hand   . Pain in joint, lower leg   . Pain in joint, shoulder region   . Pain in joint, shoulder region 04/23/2016  . Pain in limb   . Rectal bleeding 08/20/2016  . Senile osteoporosis   . Symptomatic menopausal or female climacteric states   . Syncope and collapse   . Unspecified hypertensive heart disease without heart failure 1995  . Unspecified hypothyroidism   . Unspecified polyarthropathy or polyarthritis, site unspecified   . Unspecified urinary incontinence   . Unspecified visual loss   . Xerophthalmia     Past Surgical History:  Procedure Laterality Date  . ABDOMINAL HYSTERECTOMY    . BILATERAL OOPHORECTOMY  1981  . BREAST RECONSTRUCTION Left 01/2000  . BREAST REDUCTION SURGERY Right 01/2000  . CATARACT EXTRACTION Right 09/04/2005  . COLONOSCOPY  01/2004  . MASTECTOMY  Left 10/1999  . THYROID LOBECTOMY Left 08/14/2016   Procedure: NEAR TOTAL THYROIDECTOMY;  Surgeon: Johnathan Hausen, MD;  Location: [facility];  Service: General;  Laterality: Left;  . TOTAL ABDOMINAL HYSTERECTOMY W/ BILATERAL SALPINGOOPHORECTOMY  1981  . TOTAL KNEE ARTHROPLASTY Right 2014    Allergies  Allergen Reactions  . Donepezil Hcl     Body pain and stiffness   . Namenda [Memantine Hcl]      Caused body pain   . Sulfa Antibiotics   . Sulfamethoxazole Rash    Outpatient Encounter Medications as of 03/08/2020  Medication Sig  . acetaminophen (TYLENOL) 325 MG tablet Take 650 mg by mouth every 6 (six) hours as needed.  Marland Kitchen amLODipine (NORVASC) 2.5 MG tablet TAKE 1 TABLET BY MOUTH ONCE DAILY FOR  LOWER BLOOD PRESSURE (Patient taking differently: Take 2.5 mg by mouth daily. Take with the 5mg  for a total of 7.5mg )  . amLODipine (NORVASC) 5 MG tablet TAKE 1 TABLET BY MOUTH ONCE DAILY TO LOWER BLOOD PRESSURE (Patient taking differently: Take 5 mg by mouth daily. Take with the 2.5mg  for a total of 7.5mg )  . Apoaequorin (PREVAGEN PO) Take by mouth. Regular strength 1 after breakfast  . aspirin EC 81 MG tablet Take 81 mg by mouth daily.  . Calcium Carb-Cholecalciferol (CALCIUM 600-D PO) Take 1 tablet by mouth daily.  . Cholecalciferol 5000 units capsule Take 5,000 Units by mouth daily.  . hydrochlorothiazide (HYDRODIURIL) 12.5 MG tablet Take 12.5 mg by mouth daily.  Marland Kitchen levothyroxine (SYNTHROID) 112 MCG tablet Take 1 tablet (112 mcg total) by mouth daily before breakfast.  . losartan (COZAAR) 100 MG tablet Take 100 mg by mouth daily.  Marland Kitchen MELATONIN PO Take 4 mg by mouth at bedtime.   . Multiple Vitamins-Minerals (CENTRUM SILVER PO) Take 1 tablet by mouth daily.   . Omega-3 Fatty Acids (OMEGA-3 CF PO) Take 520 mg by mouth daily.   Vladimir Faster Glycol-Propyl Glycol (SYSTANE) 0.4-0.3 % SOLN Place into both eyes. Use two drops of Systane as needed for dry eyes   No facility-administered encounter medications on file as of 03/08/2020.    Review of Systems:  Review of Systems  Review of Systems  Constitutional: Negative for activity change, appetite change, chills, diaphoresis, fatigue and fever.  HENT: Negative for mouth sores, postnasal drip, rhinorrhea, sinus pain and sore throat.   Respiratory: Negative for apnea, cough, chest tightness, shortness of breath and wheezing.   Cardiovascular: Negative for chest pain, palpitations and leg swelling.  Gastrointestinal: Negative for abdominal distention, abdominal pain, constipation, diarrhea, nausea and vomiting.  Genitourinary: Negative for dysuria and frequency.  Musculoskeletal: Negative for arthralgias, joint swelling and myalgias.  Skin: Negative  for rash.  Neurological: Negative for dizziness, syncope, weakness, light-headedness and numbness.  Psychiatric/Behavioral: Negative for behavioral problems, confusion and sleep disturbance.     Health Maintenance  Topic Date Due  . TETANUS/TDAP  10/30/2027  . INFLUENZA VACCINE  Completed  . DEXA SCAN  Completed  . PNA vac Low Risk Adult  Completed    Physical Exam: Vitals:   03/08/20 1546  Temp: 98.4 F (36.9 C)  SpO2: 98%  Weight: 165 lb 3.2 oz (74.9 kg)  Height: 5\' 3"  (1.6 m)   Body mass index is 29.26 kg/m. Physical Exam  Constitutional: Oriented to person, place, and time. Well-developed and well-nourished.  HENT:  Head: Normocephalic.  Mouth/Throat: Oropharynx is clear and moist.  Eyes: Pupils are equal, round, and reactive to light.  Neck: Neck supple.  Cardiovascular: Normal rate and normal heart sounds.  No murmur heard. Pulmonary/Chest: Effort normal and breath sounds normal. No respiratory distress. No wheezes. She has no rales.  Abdominal: Soft. Bowel sounds are normal. No distension. There is no tenderness. There is no rebound.  Musculoskeletal: No edema.  Lymphadenopathy: none Neurological: Alert and oriented to person, place, and time.  No Focal deficits. Neuro Check Normal. Was not Orthostatic No Dizziness when she stands up Skin: Skin is warm and dry.  Psychiatric: Normal mood and affect. Behavior is normal. Thought content normal.    Labs reviewed: Basic Metabolic Panel: Recent Labs    05/27/19 0000 09/30/19 0000 12/31/19 0901  NA 133* 133* 131*  K 4.6 4.8 4.7  CL 97* 97* 96*  CO2 26 22 26   GLUCOSE 90 94 92  BUN 16 17 18   CREATININE 0.76 0.75 0.71  CALCIUM 9.8 9.9 9.4  TSH 0.57 0.61 8.52*   Liver Function Tests: Recent Labs    05/27/19 0000 09/30/19 0000 12/31/19 0901  AST 17 18 17   ALT 13 15 17   BILITOT 0.3 0.5 0.4  PROT 6.9 7.0 6.7   No results for input(s): LIPASE, AMYLASE in the last 8760 hours. No results for input(s):  AMMONIA in the last 8760 hours. CBC: Recent Labs    05/27/19 0000 09/30/19 0000 12/31/19 0901  WBC 6.6 6.0 6.0  NEUTROABS 3,914 3,348 3,306  HGB 13.2 13.2 11.8  HCT 39.0 39.4 35.7  MCV 89.9 87.9 90.2  PLT 412* 401* 448*   Lipid Panel: Recent Labs    05/27/19 0000 09/30/19 0000  CHOL 205* 209*  HDL 57 61  LDLCALC 124* 125*  TRIG 129 121  CHOLHDL 3.6 3.4   No results found for: HGBA1C  Procedures since last visit: No results found.  Assessment/Plan Syncope, unspecified syncope type EKG was negative for any Acute changes. Sinus Rhythm Orthostatic Negatives Patient back to her baseline D/W the husband about risk of Cardiac or Neuro reason for her Syncope but most likely it seems Vasovagal. Continue to monitor Will go to ED if any more episodes. Labs few weeks ago were Normal Husband comfortable with Plan      Labs/tests ordered:  * No order type specified * Next appt:  03/30/2020  Total time spent in this patient care encounter was 45 _  minutes; greater than 50% of the visit spent counseling patient and staff, reviewing records , Labs and coordinating care for problems addressed at this encounter.

## 2020-03-09 DIAGNOSIS — Z0289 Encounter for other administrative examinations: Secondary | ICD-10-CM

## 2020-03-13 ENCOUNTER — Telehealth: Payer: Self-pay | Admitting: Neurology

## 2020-03-13 ENCOUNTER — Telehealth: Payer: Self-pay | Admitting: *Deleted

## 2020-03-13 NOTE — Telephone Encounter (Signed)
Pt long care form on Beckley Balint desk.

## 2020-03-13 NOTE — Telephone Encounter (Signed)
Phone rep checked office voicemail's,pt's husband(Koren,Gordon)left voicemail asking for the paperwork for Long Term care for pt to be completed and sent back as soon as possible.  Pt husband is asking for a call to discuss.

## 2020-03-14 ENCOUNTER — Other Ambulatory Visit: Payer: Self-pay | Admitting: Internal Medicine

## 2020-03-14 DIAGNOSIS — N3946 Mixed incontinence: Secondary | ICD-10-CM | POA: Diagnosis not present

## 2020-03-14 DIAGNOSIS — R296 Repeated falls: Secondary | ICD-10-CM | POA: Diagnosis not present

## 2020-03-14 DIAGNOSIS — M6281 Muscle weakness (generalized): Secondary | ICD-10-CM | POA: Diagnosis not present

## 2020-03-14 NOTE — Telephone Encounter (Addendum)
I called pts husband about the form for long term care claim. I ask him about the insurance information that needed to be on the form and he stated to leave it blank. I stated our office policy is any forms are 7 to 10 business days. He wants to have the original form because its 14 pages long. I advise him Hilda Blades from medical records can call him about the process of the form and if it can be email. He verbalized understanding. From done for Dr .Jannifer Franklin portion.

## 2020-03-14 NOTE — Telephone Encounter (Signed)
Medication not on active med list, should she still be taking this?

## 2020-03-14 NOTE — Telephone Encounter (Signed)
Form to Dr. Tobey Grim RN.

## 2020-03-16 ENCOUNTER — Telehealth: Payer: Self-pay | Admitting: *Deleted

## 2020-03-16 NOTE — Telephone Encounter (Signed)
Pt long term form email to Mr Michener

## 2020-03-27 ENCOUNTER — Other Ambulatory Visit: Payer: Self-pay | Admitting: Internal Medicine

## 2020-03-28 NOTE — Telephone Encounter (Signed)
Pended Rx for approval due to [place].

## 2020-03-30 ENCOUNTER — Other Ambulatory Visit: Payer: Self-pay

## 2020-03-30 DIAGNOSIS — E785 Hyperlipidemia, unspecified: Secondary | ICD-10-CM | POA: Diagnosis not present

## 2020-03-30 DIAGNOSIS — R635 Abnormal weight gain: Secondary | ICD-10-CM

## 2020-03-31 LAB — TSH: TSH: 0.84 mIU/L (ref 0.40–4.50)

## 2020-03-31 LAB — T4, FREE: Free T4: 1.9 ng/dL — ABNORMAL HIGH (ref 0.8–1.8)

## 2020-04-04 ENCOUNTER — Ambulatory Visit: Payer: PPO | Admitting: Neurology

## 2020-04-04 ENCOUNTER — Other Ambulatory Visit: Payer: Self-pay

## 2020-04-04 ENCOUNTER — Encounter: Payer: Self-pay | Admitting: Neurology

## 2020-04-04 DIAGNOSIS — F0390 Unspecified dementia without behavioral disturbance: Secondary | ICD-10-CM | POA: Insufficient documentation

## 2020-04-04 NOTE — Progress Notes (Signed)
I have read the note, and I agree with the clinical assessment and plan.  Tabria Steines K Daziah Hesler   

## 2020-04-04 NOTE — Patient Instructions (Signed)
It was great to see you today Memory test is stable today  Continue follow-up with primary doctor  See you as needed

## 2020-04-04 NOTE — Progress Notes (Signed)
PATIENT: Frances Holland DOB: October 31, 1929  REASON FOR VISIT: follow up HISTORY FROM: patient  HISTORY OF PRESENT ILLNESS: Today 04/04/20  Frances Holland is an 86 year old female with history of progressive memory disturbance.  She has been unable to tolerate Aricept, Namenda, Exelon.  She remains on Prevagen. She continues to live in independent living at friend's home.  She does not drive.  Her memory has overall been stable.  She now has [employer] coming 1 day a week for 3 hours, so far so good.  She sleeps well, does not go to sleep till midnight.  She walks 20 minutes 4 times a week with her husband.  She uses a walker, pretty much all the time.  Since November, has had 4 falls.  Her husband manages her medications and affairs.  She does her own ADLs, and keeps the house neat, sets the table, and manages the laundry.  Her daughter passed away from ovarian cancer last year. She presents today for follow-up accompanied by her husband.  HISTORY 10/04/2019 SS: Frances Holland is an 86 year old female with history of a progressive memory disturbance.  She has been unable to tolerate Aricept, Namenda, or Exelon.  She presents today for follow-up accompanied by her husband.  They continue to live in independent living at [facility].  She feels that her memory has been stable since last seen.  She does not drive a car.  She is able to perform their housework quite well.  She keeps the house neat, sets the table, and manages the laundry.  In general she sleeps well at night, but she reports it takes her a long time to fall asleep.  She has not had any recent falls.  She does not use any assistive devices for ambulation.  She and her husband walk on average 20 minutes daily.  She enjoys reading and doing crossword puzzles. Her husband manages their finances. Of recent, she has tried to access some of her accounts and has been unable to because she can't answer the initial security questions.  She is taking  Prevagen daily.  She completed speech therapy for her memory, but felt it was not helpful.  She has paperwork today for Dr. Jannifer Franklin to sign allowing her husband to manage her financial affairs.   REVIEW OF SYSTEMS: Out of a complete 14 system review of symptoms, the patient complains only of the following symptoms, and all other reviewed systems are negative.  Memory loss  ALLERGIES: Allergies  Allergen Reactions  . Donepezil Hcl     Body pain and stiffness   . Namenda [Memantine Hcl]      Caused body pain   . Sulfa Antibiotics   . Sulfamethoxazole Rash    HOME MEDICATIONS: Outpatient Medications Prior to Visit  Medication Sig Dispense Refill  . acetaminophen (TYLENOL) 325 MG tablet Take 650 mg by mouth every 6 (six) hours as needed.    Marland Kitchen amLODipine (NORVASC) 2.5 MG tablet TAKE 1 TABLET BY MOUTH ONCE DAILY TO LOWER BLOOD PRESSURE 90 tablet 1  . amLODipine (NORVASC) 5 MG tablet Take 1 tablet (5 mg total) by mouth daily. Take with the 2.5mg  for a total of 7.5mg  90 tablet 1  . Apoaequorin (PREVAGEN PO) Take by mouth. Regular strength 1 after breakfast    . aspirin EC 81 MG tablet Take 81 mg by mouth daily.    . Calcium Carb-Cholecalciferol (CALCIUM 600-D PO) Take 1 tablet by mouth daily.    . Cholecalciferol 5000 units  capsule Take 5,000 Units by mouth daily.    Marland Kitchen levothyroxine (SYNTHROID) 112 MCG tablet Take 1 tablet (112 mcg total) by mouth daily before breakfast. 90 tablet 1  . losartan-hydrochlorothiazide (HYZAAR) 100-12.5 MG tablet TAKE 1 TABLET BY MOUTH ONCE DAILY IN THE MORNING FOR BLOOD PRESSURE 90 tablet 0  . MELATONIN PO Take 4 mg by mouth at bedtime.     . meloxicam (MOBIC) 7.5 MG tablet Take 7.5 mg by mouth daily.    . Multiple Vitamins-Minerals (CENTRUM SILVER PO) Take 1 tablet by mouth daily.     . Omega-3 Fatty Acids (OMEGA-3 CF PO) Take 520 mg by mouth daily.     Vladimir Faster Glycol-Propyl Glycol (SYSTANE) 0.4-0.3 % SOLN Place into both eyes. Use two drops of Systane as  needed for dry eyes     No facility-administered medications prior to visit.    PAST MEDICAL HISTORY: Past Medical History:  Diagnosis Date  . Back pain   . Cancer [facility])    left breaset mastectomy   . Candidiasis of skin and nails   . Cellulitis and abscess of upper arm and forearm   . Heartburn   . Hemorrhoids 08/20/2016  . History of breast cancer 2000   History left mastectomy  . 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 Hypertension   . Insomnia   . Knee pain, bilateral 2014  . Lymphedema    Left arm following mastectomy  . Memory impairment   . Memory loss   . Migraine 05/16/2012  . Migraine without aura, without mention of intractable migraine without mention of status migrainosus    patient denies at preop of 08/08/16   . Other and unspecified hyperlipidemia   . Other bursitis disorders   . Other infective bursitis, left shoulder   . Pain in joint, ankle and foot   . Pain in joint, hand   . Pain in joint, lower leg   . Pain in joint, shoulder region   . Pain in joint, shoulder region 04/23/2016  . Pain in limb   . Rectal bleeding 08/20/2016  . Senile osteoporosis   . Symptomatic menopausal or female climacteric states   . Syncope and collapse   . Unspecified hypertensive heart disease without heart failure 1995  . Unspecified hypothyroidism   . Unspecified polyarthropathy or polyarthritis, site unspecified   . Unspecified urinary incontinence   . Unspecified visual loss   . Xerophthalmia     PAST SURGICAL HISTORY: Past Surgical History:  Procedure Laterality Date  . ABDOMINAL HYSTERECTOMY    . BILATERAL OOPHORECTOMY  1981  . BREAST RECONSTRUCTION Left 01/2000  . BREAST REDUCTION SURGERY Right 01/2000  . CATARACT EXTRACTION Right 09/04/2005  . COLONOSCOPY  01/2004  . MASTECTOMY Left 10/1999  . THYROID LOBECTOMY Left 08/14/2016   Procedure: NEAR TOTAL THYROIDECTOMY;  Surgeon: Johnathan Hausen, MD;  [facility]: [facility];  Service: General;  Laterality: Left;  .  TOTAL ABDOMINAL HYSTERECTOMY W/ BILATERAL SALPINGOOPHORECTOMY  1981  . TOTAL KNEE ARTHROPLASTY Right 2014    FAMILY HISTORY: Family History  Problem Relation Age of Onset  . Heart disease Mother   . Dementia Mother   . Dementia Father   . Cancer Daughter   . Asthma Son     SOCIAL HISTORY: Social History   Socioeconomic History  . Marital status: Married    Spouse name: Not on file  . Number of children: 3  . Years of education: 47  . Highest education level: Not on file  Occupational History  . Occupation: retired Estate agent   Tobacco Use  . Smoking status: Never Smoker  . Smokeless tobacco: Never Used  Substance and Sexual Activity  . Alcohol use: No  . Drug use: No  . Sexual activity: Never    Comment: 1st intercourse 86 yo-Fewer than 5 partners  Other Topics Concern  . Not on file  Social History Narrative   Lives at [facility] since 07/07/2014   Married -[name]   Never smoked   Right handed     Alcohol none   Caffeine 2 cups of coffee daily   Exercise walks daily, exercise class half hour 2-3 times a week   Living will,  POA   Patient believes that her memory is OK except for recall of names. Her husband is concerned about it. She scored 29/30 on MMSE in Dec 2017. They both have concerns about the future if he precedes her in death, but he has written down how he keeps accounts and has scheduled an appt with an accountant to do their taxes. They are a 2nd marriage and have his and her families although the "children" get along. They have been married 30 years.   Social Determinants of Health   Financial Resource Strain:   . Difficulty of Paying Living Expenses:   Food Insecurity:   . Worried About Charity fundraiser in the Last Year:   . Arboriculturist in the Last Year:   Transportation Needs:   . Film/video editor (Medical):   Marland Kitchen Lack of Transportation (Non-Medical):   Physical Activity:   . Days of Exercise per Week:   . Minutes of  Exercise per Session:   Stress:   . Feeling of Stress :   Social Connections:   . Frequency of Communication with Friends and Family:   . Frequency of Social Gatherings with Friends and Family:   . Attends Religious Services:   . Active Member of Clubs or Organizations:   . Attends Archivist Meetings:   Marland Kitchen Marital Status:   Intimate Partner Violence:   . Fear of Current or Ex-Partner:   . Emotionally Abused:   Marland Kitchen Physically Abused:   . Sexually Abused:    PHYSICAL EXAM  Vitals:   04/04/20 1123  BP: (!) 156/83  Pulse: 71  Weight: 167 lb (75.8 kg)  Height: 5\' 4"  (1.626 m)   Body mass index is 28.67 kg/m.  Generalized: Well developed, in no acute distress  MMSE - Mini Mental State Exam 04/04/2020 10/04/2019 06/02/2019  Not completed: - (No Data) -  Orientation to time 5 5 3   Orientation to Place 3 2 5   Registration 3 3 3   Attention/ Calculation 4 4 5   Recall 0 1 2  Language- name 2 objects 2 2 2   Language- repeat 1 1 1   Language- follow 3 step command 3 3 3   Language- read & follow direction 1 1 1   Write a sentence 1 1 1   Copy design 1 1 1   Total score 24 24 27     Neurological examination  Mentation: Alert oriented to time, place, history is equally provided by the patient and her husband. Follows all commands speech and language fluent Cranial nerve II-XII: Pupils were equal round reactive to light. Extraocular movements were full, visual field were full on confrontational test. Facial sensation and strength were normal.  Head turning and shoulder shrug  were normal and symmetric. Motor: The motor testing reveals 5  over 5 strength of all 4 extremities. Good symmetric motor tone is noted throughout.  Sensory: Sensory testing is intact to soft touch on all 4 extremities. No evidence of extinction is noted.  Coordination: Cerebellar testing reveals good finger-nose-finger and heel-to-shin bilaterally.  Gait and station: Has to push off from seated position to  stand, gait is fairly steady with walker, without is unsteady, limping type gait  Reflexes: Deep tendon reflexes are symmetric and normal bilaterally.   DIAGNOSTIC DATA (LABS, IMAGING, TESTING) - I reviewed patient records, labs, notes, testing and imaging myself where available.  Lab Results  Component Value Date   WBC 6.0 12/31/2019   HGB 11.8 12/31/2019   HCT 35.7 12/31/2019   MCV 90.2 12/31/2019   PLT 448 (H) 12/31/2019      Component Value Date/Time   NA 131 (L) 12/31/2019 0901   NA 137 09/26/2016 0000   K 4.7 12/31/2019 0901   CL 96 (L) 12/31/2019 0901   CO2 26 12/31/2019 0901   GLUCOSE 92 12/31/2019 0901   BUN 18 12/31/2019 0901   BUN 17 09/26/2016 0000   CREATININE 0.71 12/31/2019 0901   CALCIUM 9.4 12/31/2019 0901   PROT 6.7 12/31/2019 0901   ALBUMIN 4.0 07/23/2017 0814   AST 17 12/31/2019 0901   ALT 17 12/31/2019 0901   ALKPHOS 65 07/23/2017 0814   BILITOT 0.4 12/31/2019 0901   GFRNONAA 76 12/31/2019 0901   GFRAA 88 12/31/2019 0901   Lab Results  Component Value Date   CHOL 209 (H) 09/30/2019   HDL 61 09/30/2019   LDLCALC 125 (H) 09/30/2019   TRIG 121 09/30/2019   CHOLHDL 3.4 09/30/2019   No results found for: HGBA1C Lab Results  Component Value Date   VITAMINB12 601 09/09/2018   Lab Results  Component Value Date   TSH 0.84 03/30/2020      ASSESSMENT AND PLAN 86 y.o. year old female  has a past medical history of Back pain, Cancer ([place]), Candidiasis of skin and nails, Cellulitis and abscess of upper arm and forearm, Heartburn, Hemorrhoids (08/20/2016), History of breast cancer (2000), Hurthle cell adenocarcinoma ([place]) (08/20/2016), Hypertension, Insomnia, Knee pain, bilateral (2014), Lymphedema, Memory impairment, Memory loss, Migraine (05/16/2012), Migraine without aura, without mention of intractable migraine without mention of status migrainosus, Other and unspecified hyperlipidemia, Other bursitis disorders, Other infective bursitis, left shoulder,  Pain in joint, ankle and foot, Pain in joint, hand, Pain in joint, lower leg, Pain in joint, shoulder region, Pain in joint, shoulder region (04/23/2016), Pain in limb, Rectal bleeding (08/20/2016), Senile osteoporosis, Symptomatic menopausal or female climacteric states, Syncope and collapse, Unspecified hypertensive heart disease without heart failure (1995), Unspecified hypothyroidism, Unspecified polyarthropathy or polyarthritis, site unspecified, Unspecified urinary incontinence, Unspecified visual loss, and Xerophthalmia. here with:  1.  Progressive memory disturbance  Her memory has remained stable, 24/30.  She has been unable to tolerate Aricept, Namenda, or Exelon due to side effect.  I encouraged her to continue regular exercise.  She will continue follow-up with her primary doctor, follow-up in our office on an as-needed basis.  I spent 20 minutes of face-to-face and non-face-to-face time with patient.  This included previsit chart review, lab review, study review, order entry, electronic health record documentation, patient education.  Butler Denmark, AGNP-C, DNP 04/04/2020, 11:33 AM [facility] [address], [place], [id number] [address] [phone]

## 2020-04-05 ENCOUNTER — Non-Acute Institutional Stay: Payer: PPO | Admitting: Internal Medicine

## 2020-04-05 ENCOUNTER — Encounter: Payer: Self-pay | Admitting: Internal Medicine

## 2020-04-05 VITALS — BP 114/66 | HR 87 | Temp 98.0°F | Ht 64.0 in | Wt 167.0 lb

## 2020-04-05 DIAGNOSIS — I10 Essential (primary) hypertension: Secondary | ICD-10-CM | POA: Diagnosis not present

## 2020-04-05 DIAGNOSIS — G301 Alzheimer's disease with late onset: Secondary | ICD-10-CM

## 2020-04-05 DIAGNOSIS — F411 Generalized anxiety disorder: Secondary | ICD-10-CM | POA: Diagnosis not present

## 2020-04-05 DIAGNOSIS — Z7189 Other specified counseling: Secondary | ICD-10-CM

## 2020-04-05 DIAGNOSIS — M545 Low back pain: Secondary | ICD-10-CM | POA: Diagnosis not present

## 2020-04-05 DIAGNOSIS — F329 Major depressive disorder, single episode, unspecified: Secondary | ICD-10-CM | POA: Diagnosis not present

## 2020-04-05 DIAGNOSIS — E785 Hyperlipidemia, unspecified: Secondary | ICD-10-CM | POA: Diagnosis not present

## 2020-04-05 DIAGNOSIS — R32 Unspecified urinary incontinence: Secondary | ICD-10-CM

## 2020-04-05 DIAGNOSIS — F0280 Dementia in other diseases classified elsewhere without behavioral disturbance: Secondary | ICD-10-CM

## 2020-04-05 DIAGNOSIS — F32A Depression, unspecified: Secondary | ICD-10-CM

## 2020-04-05 DIAGNOSIS — G8929 Other chronic pain: Secondary | ICD-10-CM | POA: Diagnosis not present

## 2020-04-05 DIAGNOSIS — D1801 Hemangioma of skin and subcutaneous tissue: Secondary | ICD-10-CM | POA: Diagnosis not present

## 2020-04-05 NOTE — Progress Notes (Addendum)
Location:  [place] of Service:  Clinic (12)  Provider:   Code Status: Full Code Goals of Care:  Advanced Directives 03/24/2019  Does Patient Have a Medical Advance Directive? Yes  Type of Paramedic of [place];Out of facility DNR (pink MOST or yellow form)  Does patient want to make changes to medical advance directive? No - Patient declined  Copy of [place] in Chart? -  Pre-existing out of facility DNR order (yellow form or pink MOST form) Pink MOST form placed in chart (order not valid for inpatient use)     Chief Complaint  Patient presents with  . Medical Management of Chronic Issues    6 month follow. Patient would like you to take a look at a recent growth on her left leg.     HPI: Patient is a 86 y.o. female seen today for medical management of chronic diseases.    Patient has h/o Hypertension, Hypothyroidism, Insomnia and Cognitive impairment. Also Has h/o Skin Cancer and Thyroid Cancer  Lives with her husband in [place] in her [place] with the [place] in Left leg Noticed few weeks ago No Pain or Injury Mood much better.Since loosing her daughter in Nov of last year Back pain is mOslty resolved No Falls   Past Medical History:  Diagnosis Date  . Back pain   . [facility])    left breaset mastectomy   . Candidiasis of skin and nails   . Cellulitis and abscess of upper arm and forearm   . Heartburn   . Hemorrhoids 08/20/2016  . History of breast cancer 2000   History left mastectomy  . 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 Hypertension   . Insomnia   . Knee pain, bilateral 2014  . Lymphedema    Left arm following mastectomy  . Memory impairment   . Memory loss   . Migraine 05/16/2012  . Migraine without aura, without mention of intractable migraine without mention of status migrainosus    patient denies at preop of 08/08/16   . Other and unspecified hyperlipidemia     . Other bursitis disorders   . Other infective bursitis, left shoulder   . Pain in joint, ankle and foot   . Pain in joint, hand   . Pain in joint, lower leg   . Pain in joint, shoulder region   . Pain in joint, shoulder region 04/23/2016  . Pain in limb   . Rectal bleeding 08/20/2016  . Senile osteoporosis   . Symptomatic menopausal or female climacteric states   . Syncope and collapse   . Unspecified hypertensive heart disease without heart failure 1995  . Unspecified hypothyroidism   . Unspecified polyarthropathy or polyarthritis, site unspecified   . Unspecified urinary incontinence   . Unspecified visual loss   . Xerophthalmia     Past Surgical History:  Procedure Laterality Date  . ABDOMINAL HYSTERECTOMY    . BILATERAL OOPHORECTOMY  1981  . BREAST RECONSTRUCTION Left 01/2000  . BREAST REDUCTION SURGERY Right 01/2000  . CATARACT EXTRACTION Right 09/04/2005  . COLONOSCOPY  01/2004  . MASTECTOMY Left 10/1999  . THYROID LOBECTOMY Left 08/14/2016   Procedure: NEAR TOTAL THYROIDECTOMY;  Surgeon: Johnathan Hausen, MD;  Location: [facility];  Service: General;  Laterality: Left;  . TOTAL ABDOMINAL HYSTERECTOMY W/ BILATERAL SALPINGOOPHORECTOMY  1981  . TOTAL KNEE ARTHROPLASTY Right 2014    Allergies  Allergen Reactions  . Donepezil  Hcl     Body pain and stiffness   . Namenda [Memantine Hcl]      Caused body pain   . Sulfa Antibiotics   . Sulfamethoxazole Rash    Outpatient Encounter Medications as of 04/05/2020  Medication Sig  . acetaminophen (TYLENOL) 325 MG tablet Take 650 mg by mouth every 6 (six) hours as needed.  Marland Kitchen amLODipine (NORVASC) 2.5 MG tablet TAKE 1 TABLET BY MOUTH ONCE DAILY TO LOWER BLOOD PRESSURE  . amLODipine (NORVASC) 5 MG tablet Take 1 tablet (5 mg total) by mouth daily. Take with the 2.5mg  for a total of 7.5mg   . Apoaequorin (PREVAGEN PO) Take by mouth. Regular strength 1 after breakfast  . aspirin EC 81 MG tablet Take 81 mg by mouth daily.  . Calcium  Carb-Cholecalciferol (CALCIUM 600-D PO) Take 1 tablet by mouth daily.  . Cholecalciferol 5000 units capsule Take 5,000 Units by mouth daily.  Marland Kitchen levothyroxine (SYNTHROID) 112 MCG tablet Take 1 tablet (112 mcg total) by mouth daily before breakfast.  . losartan-hydrochlorothiazide (HYZAAR) 100-12.5 MG tablet TAKE 1 TABLET BY MOUTH ONCE DAILY IN THE MORNING FOR BLOOD PRESSURE  . MELATONIN PO Take 4 mg by mouth at bedtime.   . meloxicam (MOBIC) 7.5 MG tablet Take 7.5 mg by mouth daily.  . Multiple Vitamins-Minerals (CENTRUM SILVER PO) Take 1 tablet by mouth daily.   . Omega-3 Fatty Acids (OMEGA-3 CF PO) Take 520 mg by mouth daily.   Vladimir Faster Glycol-Propyl Glycol (SYSTANE) 0.4-0.3 % SOLN Place into both eyes. Use two drops of Systane as needed for dry eyes   No facility-administered encounter medications on file as of 04/05/2020.    Review of Systems:  Review of Systems  Review of Systems  Constitutional: Negative for activity change, appetite change, chills, diaphoresis, fatigue and fever.  HENT: Negative for mouth sores, postnasal drip, rhinorrhea, sinus pain and sore throat.   Respiratory: Negative for apnea, cough, chest tightness, shortness of breath and wheezing.   Cardiovascular: Negative for chest pain, palpitations and leg swelling.  Gastrointestinal: Negative for abdominal distention, abdominal pain, constipation, diarrhea, nausea and vomiting.  Genitourinary: Negative for dysuria and frequency.  Musculoskeletal: Negative for arthralgias, joint swelling and myalgias.  Skin: Negative for rash.  Neurological: Negative for dizziness, syncope, weakness, light-headedness and numbness.  Psychiatric/Behavioral: Negative for behavioral problems, confusion and sleep disturbance.     Health Maintenance  Topic Date Due  . INFLUENZA VACCINE  07/09/2020  . TETANUS/TDAP  10/30/2027  . DEXA SCAN  Completed  . COVID-19 Vaccine  Completed  . PNA vac Low Risk Adult  Completed    Physical  Exam: Vitals:   04/05/20 1435  BP: 114/66  Pulse: 87  Temp: 98 F (36.7 C)  SpO2: 97%  Weight: 167 lb (75.8 kg)  Height: 5\' 4"  (1.626 m)   Body mass index is 28.67 kg/m. Physical Exam  Constitutional: Oriented to person, place, and time. Well-developed and well-nourished.  HENT:  Head: Normocephalic.  Mouth/Throat: Oropharynx is clear and moist.  Eyes: Pupils are equal, round, and reactive to light.  Neck: Neck supple.  Cardiovascular: Normal rate and normal heart sounds.  No murmur heard. Pulmonary/Chest: Effort normal and breath sounds normal. No respiratory distress. No wheezes. She has no rales.  Abdominal: Soft. Bowel sounds are normal. No distension. There is no tenderness. There is no rebound.  Musculoskeletal: No edema.  Lymphadenopathy: none Neurological: Alert and oriented to person, place, and time.  Skin: Skin is warm and dry. Marcelline Mates  Angioma on Left leg Psychiatric: Normal mood and affect. Behavior is normal. Thought content normal.    Labs reviewed: Basic Metabolic Panel: Recent Labs    05/27/19 0000 05/27/19 0000 09/30/19 0000 12/31/19 0901 03/30/20 0828  NA 133*  --  133* 131*  --   K 4.6  --  4.8 4.7  --   CL 97*  --  97* 96*  --   CO2 26  --  22 26  --   GLUCOSE 90  --  94 92  --   BUN 16  --  17 18  --   CREATININE 0.76  --  0.75 0.71  --   CALCIUM 9.8  --  9.9 9.4  --   TSH 0.57   < > 0.61 8.52* 0.84   < > = values in this interval not displayed.   Liver Function Tests: Recent Labs    05/27/19 0000 09/30/19 0000 12/31/19 0901  AST 17 18 17   ALT 13 15 17   BILITOT 0.3 0.5 0.4  PROT 6.9 7.0 6.7   No results for input(s): LIPASE, AMYLASE in the last 8760 hours. No results for input(s): AMMONIA in the last 8760 hours. CBC: Recent Labs    05/27/19 0000 09/30/19 0000 12/31/19 0901  WBC 6.6 6.0 6.0  NEUTROABS 3,914 3,348 3,306  HGB 13.2 13.2 11.8  HCT 39.0 39.4 35.7  MCV 89.9 87.9 90.2  PLT 412* 401* 448*   Lipid Panel: Recent  Labs    05/27/19 0000 09/30/19 0000  CHOL 205* 209*  HDL 57 61  LDLCALC 124* 125*  TRIG 129 121  CHOLHDL 3.6 3.4   No results found for: HGBA1C  Procedures since last visit: No results found.  Assessment/Plan Cherry angioma - Plan: Ambulatory referral to Dermatology  Essential hypertension Stable on Norvasc, Cozaar and HCTZ  Late onset Alzheimer's 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 ([place]) Saw Neurology MMSE 24/30 which is stable Not on Any Meds as she has not toerated Hyperlipidemia, unspecified hyperlipidemia type Did not Tolerate Statins Generalized anxiety disorder Doing better Does not need Any Meds Acute depression due to Loss of her daughter Feeling better Urinary incontinence, unspecified type Worked with OT Seems to be doing well Chronic midline low back pain without sciatica No Pain anymore Hypothyroidism TSH normal now Same dose of Synthroid  ACP (advance care planning) Discussed in detail with Patient and her husband about CPR. She will talk to her son and will let us know next week about the MOST form in Annual Wellness visit   Labs/tests ordered:  * No order type specified * Next appt:  04/11/2020

## 2020-04-06 ENCOUNTER — Telehealth: Payer: Self-pay | Admitting: *Deleted

## 2020-04-06 NOTE — Telephone Encounter (Signed)
1.) Patient husband, Frances Holland called and stated that patient has an appointment yesterday and was recommended she get a Dermatology appointment soon. They have an appointment set up for 04/11/20 at 12:20 with Dr. Wilhemina Bonito.  Patient also had an appointment with [facility] same day at 2:00 that they are requesting to be moved later. Appointment with [facility] moved to 4:15. Patient agreed.   2.) Also, Husband stated that he had dropped of Certificate of Disability back in October of last year. His attorney Beather Arbour) is wanting that Disability statement to be written on our letter head and faxed to his office at Fax: [phone].   Please Advise.

## 2020-04-06 NOTE — Telephone Encounter (Signed)
Virgie Dad, MD     09/20/19 2:35 PM Note   Let him know that I have it and will work on it this week. I should have for him when he comes for his Follow up appointment in clinic with me.        09/20/19 9:53 AM You routed this conversation to Virgie Dad, MD . Charlyne Petrin, CMA . Eilene Ghazi, RMA  Me     09/20/19 9:52 AM Note   Patient husband called and stated that last Wednesday he dropped off a Certificate of Disability at the [facility] for Dr. Lyndel Safe to fill out and sign. Patient is wanting to know when this will be completed. Please Advise.         Message Dated 09/20/2019  Patient needs letter stating Disability on our Letterhead faxed to their attorney.

## 2020-04-06 NOTE — Telephone Encounter (Signed)
I have not seen any Disability form or atleast I don't remember. Can he send it again? As we will need it again to do the letter

## 2020-04-11 ENCOUNTER — Non-Acute Institutional Stay: Payer: PPO | Admitting: Nurse Practitioner

## 2020-04-11 ENCOUNTER — Other Ambulatory Visit: Payer: Self-pay

## 2020-04-11 ENCOUNTER — Encounter: Payer: Self-pay | Admitting: Nurse Practitioner

## 2020-04-11 DIAGNOSIS — Z7189 Other specified counseling: Secondary | ICD-10-CM | POA: Diagnosis not present

## 2020-04-11 DIAGNOSIS — D485 Neoplasm of uncertain behavior of skin: Secondary | ICD-10-CM | POA: Diagnosis not present

## 2020-04-11 DIAGNOSIS — Z0000 Encounter for general adult medical examination without abnormal findings: Secondary | ICD-10-CM | POA: Diagnosis not present

## 2020-04-11 DIAGNOSIS — D1801 Hemangioma of skin and subcutaneous tissue: Secondary | ICD-10-CM | POA: Diagnosis not present

## 2020-04-11 DIAGNOSIS — Z85828 Personal history of other malignant neoplasm of skin: Secondary | ICD-10-CM | POA: Diagnosis not present

## 2020-04-11 NOTE — Patient Instructions (Signed)
Frances Holland , Thank you for taking time to come for your Medicare Wellness Visit. I appreciate your ongoing commitment to your health goals. Please review the following plan we discussed and let me know if I can assist you in the future.   Screening recommendations/referrals: Colonoscopy aged out Mammogram aged out Bone Density delay Recommended yearly ophthalmology/optometry visit for glaucoma screening and checkup Recommended yearly dental visit for hygiene and checkup  Vaccinations: Influenza vaccine up to date Pneumococcal vaccine up to date Tdap vaccine up to date Shingles vaccine up to date  Advanced directives: up to date  Conditions/risks identified: MMSE 24/30 risk for cognitive decline.   Next appointment: 1 year   Preventive Care 84 Years and Older, Female Preventive care refers to lifestyle choices and visits with your health care provider that can promote health and wellness. What does preventive care include?  A yearly physical exam. This is also called an annual well check.  Dental exams once or twice a year.  Routine eye exams. Ask your health care provider how often you should have your eyes checked.  Personal lifestyle choices, including:  Daily care of your teeth and gums.  Regular physical activity.  Eating a healthy diet.  Avoiding tobacco and drug use.  Limiting alcohol use.  Practicing safe sex.  Taking low-dose aspirin every day.  Taking vitamin and mineral supplements as recommended by your health care provider. What happens during an annual well check? The services and screenings done by your health care provider during your annual well check will depend on your age, overall health, lifestyle risk factors, and family history of disease. Counseling  Your health care provider may ask you questions about your:  Alcohol use.  Tobacco use.  Drug use.  Emotional well-being.  Home and relationship well-being.  Sexual  activity.  Eating habits.  History of falls.  Memory and ability to understand (cognition).  Work and work Statistician.  Reproductive health. Screening  You may have the following tests or measurements:  Height, weight, and BMI.  Blood pressure.  Lipid and cholesterol levels. These may be checked every 5 years, or more frequently if you are over 67 years old.  Skin check.  Lung cancer screening. You may have this screening every year starting at age 97 if you have a 30-pack-year history of smoking and currently smoke or have quit within the past 15 years.  Fecal occult blood test (FOBT) of the stool. You may have this test every year starting at age 76.  Flexible sigmoidoscopy or colonoscopy. You may have a sigmoidoscopy every 5 years or a colonoscopy every 10 years starting at age 82.  Hepatitis C blood test.  Hepatitis B blood test.  Sexually transmitted disease (STD) testing.  Diabetes screening. This is done by checking your blood sugar (glucose) after you have not eaten for a while (fasting). You may have this done every 1-3 years.  Bone density scan. This is done to screen for osteoporosis. You may have this done starting at age 44.  Mammogram. This may be done every 1-2 years. Talk to your health care provider about how often you should have regular mammograms. Talk with your health care provider about your test results, treatment options, and if necessary, the need for more tests. Vaccines  Your health care provider may recommend certain vaccines, such as:  Influenza vaccine. This is recommended every year.  Tetanus, diphtheria, and acellular pertussis (Tdap, Td) vaccine. You may need a Td booster every 10 years.  Zoster vaccine. You may need this after age 64.  Pneumococcal 13-valent conjugate (PCV13) vaccine. One dose is recommended after age 59.  Pneumococcal polysaccharide (PPSV23) vaccine. One dose is recommended after age 55. Talk to your health care  provider about which screenings and vaccines you need and how often you need them. This information is not intended to replace advice given to you by your health care provider. Make sure you discuss any questions you have with your health care provider. Document Released: 12/22/2015 Document Revised: 08/14/2016 Document Reviewed: 09/26/2015 Elsevier Interactive Patient Education  2017 [place] Prevention in the Home Falls can cause injuries. They can happen to people of all ages. There are many things you can do to make your home safe and to help prevent falls. What can I do on the outside of my home?  Regularly fix the edges of walkways and driveways and fix any cracks.  Remove anything that might make you trip as you walk through a door, such as a raised step or threshold.  Trim any bushes or trees on the path to your home.  Use bright outdoor lighting.  Clear any walking paths of anything that might make someone trip, such as rocks or tools.  Regularly check to see if handrails are loose or broken. Make sure that both sides of any steps have handrails.  Any raised decks and porches should have guardrails on the edges.  Have any leaves, snow, or ice cleared regularly.  Use sand or salt on walking paths during winter.  Clean up any spills in your garage right away. This includes oil or grease spills. What can I do in the bathroom?  Use night lights.  Install grab bars by the toilet and in the tub and shower. Do not use towel bars as grab bars.  Use non-skid mats or decals in the tub or shower.  If you need to sit down in the shower, use a plastic, non-slip stool.  Keep the floor dry. Clean up any water that spills on the floor as soon as it happens.  Remove soap buildup in the tub or shower regularly.  Attach bath mats securely with double-sided non-slip rug tape.  Do not have throw rugs and other things on the floor that can make you trip. What can I do in  the bedroom?  Use night lights.  Make sure that you have a light by your bed that is easy to reach.  Do not use any sheets or blankets that are too big for your bed. They should not hang down onto the floor.  Have a firm chair that has side arms. You can use this for support while you get dressed.  Do not have throw rugs and other things on the floor that can make you trip. What can I do in the kitchen?  Clean up any spills right away.  Avoid walking on wet floors.  Keep items that you use a lot in easy-to-reach places.  If you need to reach something above you, use a strong step stool that has a grab bar.  Keep electrical cords out of the way.  Do not use floor polish or wax that makes floors slippery. If you must use wax, use non-skid floor wax.  Do not have throw rugs and other things on the floor that can make you trip. What can I do with my stairs?  Do not leave any items on the stairs.  Make sure that there are  handrails on both sides of the stairs and use them. Fix handrails that are broken or loose. Make sure that handrails are as long as the stairways.  Check any carpeting to make sure that it is firmly attached to the stairs. Fix any carpet that is loose or worn.  Avoid having throw rugs at the top or bottom of the stairs. If you do have throw rugs, attach them to the floor with carpet tape.  Make sure that you have a light switch at the top of the stairs and the bottom of the stairs. If you do not have them, ask someone to add them for you. What else can I do to help prevent falls?  Wear shoes that:  Do not have high heels.  Have rubber bottoms.  Are comfortable and fit you well.  Are closed at the toe. Do not wear sandals.  If you use a stepladder:  Make sure that it is fully opened. Do not climb a closed stepladder.  Make sure that both sides of the stepladder are locked into place.  Ask someone to hold it for you, if possible.  Clearly mark and  make sure that you can see:  Any grab bars or handrails.  First and last steps.  Where the edge of each step is.  Use tools that help you move around (mobility aids) if they are needed. These include:  Canes.  Walkers.  Scooters.  Crutches.  Turn on the lights when you go into a dark area. Replace any light bulbs as soon as they burn out.  Set up your furniture so you have a clear path. Avoid moving your furniture around.  If any of your floors are uneven, fix them.  If there are any pets around you, be aware of where they are.  Review your medicines with your doctor. Some medicines can make you feel dizzy. This can increase your chance of falling. Ask your doctor what other things that you can do to help prevent falls. This information is not intended to replace advice given to you by your health care provider. Make sure you discuss any questions you have with your health care provider. Document Released: 09/21/2009 Document Revised: 05/02/2016 Document Reviewed: 12/30/2014 Elsevier Interactive Patient Education  2017 [employer].

## 2020-04-11 NOTE — Progress Notes (Signed)
Subjective:   Frances Holland is a 86 y.o. female who presents for Medicare Annual (Subsequent) preventive examination at clinic [facility]     Objective:     Vitals: Pulse 92   Temp 97.8 F (36.6 C)   SpO2 97%   There is no height or weight on file to calculate BMI.  Advanced Directives 04/11/2020 03/24/2019 02/26/2019 02/17/2019 12/23/2018 10/30/2018 07/01/2018  Does Patient Have a Medical Advance Directive? Yes Yes Yes Yes Yes Yes Yes  Type of Paramedic of [place];Living will [place];Out of facility DNR (pink MOST or yellow form) Healthcare Power of [place] of [place] of [place];Out of facility DNR (pink MOST or yellow form) [place];Living will;Out of facility DNR (pink MOST or yellow form)  Does patient want to make changes to medical advance directive? No - Patient declined No - Patient declined No - Patient declined No - Patient declined - No - Patient declined No - Patient declined  Copy of [place] in Chart? Yes - validated most recent copy scanned in chart (See row information) - Yes - validated most recent copy scanned in chart (See row information) Yes - validated most recent copy scanned in chart (See row information) Yes - validated most recent copy scanned in chart (See row information) Yes - validated most recent copy scanned in chart (See row information) Yes  Pre-existing out of facility DNR order (yellow form or pink MOST form) - Pink MOST form placed in chart (order not valid for inpatient use) - Yellow form placed in chart (order not valid for inpatient use) Yellow form placed in chart (order not valid for inpatient use) Pink MOST form placed in chart (order not valid for inpatient use) Pink MOST form placed in chart (order not valid for inpatient use)    Tobacco Social History   Tobacco Use  Smoking Status Never  Smoker  Smokeless Tobacco Never Used     Counseling given: Not Answered   Clinical Intake:  Pre-visit preparation completed: Yes  Pain : No/denies pain     BMI - recorded: 28.67 Nutritional Status: BMI 25 -29 Overweight Nutritional Risks: None Diabetes: No  How often do you need to have someone help you when you read instructions, pamphlets, or other written materials from your doctor or pharmacy?: 1 - Never  Interpreter Needed?: No  Information entered by :: Lorenza Winkleman Bretta Bang NP  Past Medical History:  Diagnosis Date  . Back pain   . Cancer [facility])    left breaset mastectomy   . Candidiasis of skin and nails   . Cellulitis and abscess of upper arm and forearm   . Heartburn   . Hemorrhoids 08/20/2016  . History of breast cancer 2000   History left mastectomy  . 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 Hypertension   . Insomnia   . Knee pain, bilateral 2014  . Lymphedema    Left arm following mastectomy  . Memory impairment   . Memory loss   . Migraine 05/16/2012  . Migraine without aura, without mention of intractable migraine without mention of status migrainosus    patient denies at preop of 08/08/16   . Other and unspecified hyperlipidemia   . Other bursitis disorders   . Other infective bursitis, left shoulder   . Pain in joint, ankle and foot   . Pain in joint, hand   . Pain  in joint, lower leg   . Pain in joint, shoulder region   . Pain in joint, shoulder region 04/23/2016  . Pain in limb   . Rectal bleeding 08/20/2016  . Senile osteoporosis   . Symptomatic menopausal or female climacteric states   . Syncope and collapse   . Unspecified hypertensive heart disease without heart failure 1995  . Unspecified hypothyroidism   . Unspecified polyarthropathy or polyarthritis, site unspecified   . Unspecified urinary incontinence   . Unspecified visual loss   . Xerophthalmia    Past Surgical History:  Procedure Laterality Date  . ABDOMINAL HYSTERECTOMY    .  BILATERAL OOPHORECTOMY  1981  . BREAST RECONSTRUCTION Left 01/2000  . BREAST REDUCTION SURGERY Right 01/2000  . CATARACT EXTRACTION Right 09/04/2005  . COLONOSCOPY  01/2004  . MASTECTOMY Left 10/1999  . THYROID LOBECTOMY Left 08/14/2016   Procedure: NEAR TOTAL THYROIDECTOMY;  Surgeon: Johnathan Hausen, MD;  Location: [facility];  Service: General;  Laterality: Left;  . TOTAL ABDOMINAL HYSTERECTOMY W/ BILATERAL SALPINGOOPHORECTOMY  1981  . TOTAL KNEE ARTHROPLASTY Right 2014   Family History  Problem Relation Age of Onset  . Heart disease Mother   . Dementia Mother   . Dementia Father   . Cancer Daughter   . Asthma Son    Social History   Socioeconomic History  . Marital status: Married    Spouse name: Not on file  . Number of children: 3  . Years of education: 6  . Highest education level: Not on file  Occupational History  . Occupation: retired Estate agent   Tobacco Use  . Smoking status: Never Smoker  . Smokeless tobacco: Never Used  Substance and Sexual Activity  . Alcohol use: No  . Drug use: No  . Sexual activity: Never    Comment: 1st intercourse 86 yo-Fewer than 5 partners  Other Topics Concern  . Not on file  Social History Narrative   [facility] since 07/07/2014   Married -[name]   Never smoked   Right handed     Alcohol none   Caffeine 2 cups of coffee daily   Exercise walks daily, exercise class half hour 2-3 times a week   Living will,  POA   Patient believes that her memory is OK except for recall of names. Her husband is concerned about it. She scored 29/30 on MMSE in Dec 2017. They both have concerns about the future if he precedes her in death, but he has written down how he keeps accounts and has scheduled an appt with an accountant to do their taxes. They are a 2nd marriage and have his and her families although the "children" get along. They have been married 30 years.   Social Determinants of Health   Financial Resource Strain:   .  Difficulty of Paying Living Expenses:   Food Insecurity:   . Worried About Charity fundraiser in the Last Year:   . Arboriculturist in the Last Year:   Transportation Needs:   . Film/video editor (Medical):   Marland Kitchen Lack of Transportation (Non-Medical):   Physical Activity:   . Days of Exercise per Week:   . Minutes of Exercise per Session:   Stress:   . Feeling of Stress :   Social Connections:   . Frequency of Communication with Friends and Family:   . Frequency of Social Gatherings with Friends and Family:   . Attends Religious Services:   .  Active Member of Clubs or Organizations:   . Attends Archivist Meetings:   Marland Kitchen Marital Status:     Outpatient Encounter Medications as of 04/11/2020  Medication Sig  . acetaminophen (TYLENOL) 325 MG tablet Take 650 mg by mouth every 6 (six) hours as needed.  Marland Kitchen amLODipine (NORVASC) 2.5 MG tablet TAKE 1 TABLET BY MOUTH ONCE DAILY TO LOWER BLOOD PRESSURE  . amLODipine (NORVASC) 5 MG tablet Take 1 tablet (5 mg total) by mouth daily. Take with the 2.5mg  for a total of 7.5mg   . Apoaequorin (PREVAGEN PO) Take by mouth. Regular strength 1 after breakfast  . aspirin EC 81 MG tablet Take 81 mg by mouth daily.  . Calcium Carb-Cholecalciferol (CALCIUM 600-D PO) Take 1 tablet by mouth daily.  . Cholecalciferol 5000 units capsule Take 5,000 Units by mouth daily.  Marland Kitchen levothyroxine (SYNTHROID) 112 MCG tablet Take 1 tablet (112 mcg total) by mouth daily before breakfast.  . losartan-hydrochlorothiazide (HYZAAR) 100-12.5 MG tablet TAKE 1 TABLET BY MOUTH ONCE DAILY IN THE MORNING FOR BLOOD PRESSURE  . MELATONIN PO Take 4 mg by mouth at bedtime.   . meloxicam (MOBIC) 7.5 MG tablet Take 7.5 mg by mouth daily.  . Multiple Vitamins-Minerals (CENTRUM SILVER PO) Take 1 tablet by mouth daily.   . Omega-3 Fatty Acids (OMEGA-3 CF PO) Take 520 mg by mouth daily.   Vladimir Faster Glycol-Propyl Glycol (SYSTANE) 0.4-0.3 % SOLN Place into both eyes. Use two drops of  Systane as needed for dry eyes   No facility-administered encounter medications on file as of 04/11/2020.    Activities of Daily Living In your present state of health, do you have any difficulty performing the following activities: 04/11/2020  Hearing? N  Vision? N  Difficulty concentrating or making decisions? N  Walking or climbing stairs? N  Comment walker  Dressing or bathing? N  Doing errands, shopping? N  Preparing Food and eating ? N  Using the Toilet? N  In the past six months, have you accidently leaked urine? N  Do you have problems with loss of bowel control? N  Managing your Medications? N  Comment with assistance of her husband  Managing your Finances? N  Comment with assistance of her husband  Housekeeping or managing your Housekeeping? N  Some recent data might be hidden    Patient Care Team: Virgie Dad, MD as PCP - General (Internal Medicine) Reynold Bowen, MD as Consulting Physician (Endocrinology)    Assessment:   This is a routine wellness examination for [place].  Exercise Activities and Dietary recommendations Current Exercise Habits: Home exercise routine, Type of exercise: walking;stretching, Time (Minutes): 15, Frequency (Times/Week): 5, Weekly Exercise (Minutes/Week): 75, Intensity: Mild  Goals    . Increase physical activity    . Maintain LIfestyle     Starting today pt will maintain lifestyle.        Fall Risk Fall Risk  04/11/2020 04/05/2020 02/16/2020 01/12/2020 12/29/2019  Falls in the past year? 0 0 0 0 1  Number falls in past yr: 0 0 0 0 1  Injury with Fall? - - - - 0   Is the patient's home free of loose throw rugs in walkways, pet beds, electrical cords, etc?   yes      Grab bars in the bathroom? yes      Handrails on the stairs?   yes      Adequate lighting?   yes  Timed Get Up and Go performed: NA  Depression Screen PHQ 2/9 Scores 04/11/2020 10/30/2018 07/01/2018 10/10/2017  PHQ - 2 Score 0 0 0 0  PHQ- 9 Score 5 - 6 -      Cognitive Function MMSE - Mini Mental State Exam 04/11/2020 04/04/2020 10/04/2019 06/02/2019 09/09/2018  Not completed: - - (No Data) - -  Orientation to time 3 5 5 3 5   Orientation to Place 4 3 2 5 4   Registration 3 3 3 3 3   Attention/ Calculation 3 4 4 5 5   Recall 2 0 1 2 0  Language- name 2 objects 2 2 2 2 2   Language- repeat 1 1 1 1 1   Language- follow 3 step command 3 3 3 3 3   Language- read & follow direction 1 1 1 1 1   Write a sentence 1 1 1 1 1   Copy design 1 1 1 1 1   Total score 24 24 24 27 26         Immunization History  Administered Date(s) Administered  . Influenza, High Dose Seasonal PF 09/17/2017, 09/22/2019  . Influenza,inj,Quad PF,6+ Mos 09/10/2018  . Influenza-Unspecified 08/10/2013, 09/07/2015, 09/13/2016  . Moderna SARS-COVID-2 Vaccination 12/13/2019, 01/10/2020  . Pneumococcal Conjugate-13 06/08/2017, 11/02/2018  . Pneumococcal Polysaccharide-23 12/09/2002, 10/22/2017  . Tdap 02/21/2014, 10/29/2017  . Zoster Recombinat (Shingrix) 07/18/2017, 01/10/2018    Qualifies for Shingles Vaccine?up to date  Screening Tests Health Maintenance  Topic Date Due  . INFLUENZA VACCINE  07/09/2020  . TETANUS/TDAP  10/30/2027  . DEXA SCAN  Completed  . COVID-19 Vaccine  Completed  . PNA vac Low Risk Adult  Completed    Cancer Screenings: Lung: Low Dose CT Chest recommended if Age 107-80 years, 30 pack-year currently smoking OR have quit w/in 15years. Patient doesn't qualify. Breast:  Up to date on Mammogram? Aged out Up to date of Bone Density/Dexa?   Colorectal: aged out  Additional Screenings: NA Hepatitis C Screening:      Plan:   up to date  I have personally reviewed and noted the following in the patient's chart:   . Medical and social history . Use of alcohol, tobacco or illicit drugs  . Current medications and supplements . Functional ability and status . Nutritional status . Physical activity . Advanced directives . List of other physicians .  Hospitalizations, surgeries, and ER visits in previous 12 months . Vitals . Screenings to include cognitive, depression, and falls . Referrals and appointments  In addition, I have reviewed and discussed with patient certain preventive protocols, quality metrics, and best practice recommendations. A written personalized care plan for preventive services as well as general preventive health recommendations were provided to patient.     Chanelle Hodsdon X Haillie Radu, NP  04/12/2020   Counseling regarding advanced care planning and goals of care The patient and the patient's husband both present, goals of care discussed, questions were answered to my best ability, time spend 25 minutes. DNR and MOST forms signed, copied, and filled. The patient has decided to be DNR when the patient has no pulse and is not breathing. The patient desires being transferred to hospital if indicated with limited additional interventions and avoid intensive care, antibiotics if indicated, IV fluids and feeding tube for a defined trial period.

## 2020-04-12 ENCOUNTER — Encounter: Payer: Self-pay | Admitting: Nurse Practitioner

## 2020-04-12 DIAGNOSIS — Z7189 Other specified counseling: Secondary | ICD-10-CM | POA: Insufficient documentation

## 2020-04-12 NOTE — Assessment & Plan Note (Signed)
The patient and the patient's husband both present, goals of care discussed, questions were answered to my best ability, time spend 25 minutes. DNR and MOST forms signed, copied, and filled. The patient has decided to be DNR when the patient has no pulse and is not breathing. The patient desires being transferred to hospital if indicated with limited additional interventions and avoid intensive care, antibiotics if indicated, IV fluids and feeding tube for a defined trial period.

## 2020-04-18 NOTE — Telephone Encounter (Signed)
Letter is done and has been sent to his attorney

## 2020-05-31 ENCOUNTER — Encounter: Payer: Self-pay | Admitting: Internal Medicine

## 2020-05-31 ENCOUNTER — Non-Acute Institutional Stay: Payer: PPO | Admitting: Internal Medicine

## 2020-05-31 ENCOUNTER — Other Ambulatory Visit: Payer: Self-pay

## 2020-05-31 VITALS — BP 138/78 | HR 81 | Temp 97.5°F | Ht 64.0 in | Wt 168.6 lb

## 2020-05-31 DIAGNOSIS — E785 Hyperlipidemia, unspecified: Secondary | ICD-10-CM

## 2020-05-31 DIAGNOSIS — I10 Essential (primary) hypertension: Secondary | ICD-10-CM

## 2020-05-31 DIAGNOSIS — N3946 Mixed incontinence: Secondary | ICD-10-CM | POA: Diagnosis not present

## 2020-05-31 DIAGNOSIS — G301 Alzheimer's disease with late onset: Secondary | ICD-10-CM

## 2020-05-31 DIAGNOSIS — F411 Generalized anxiety disorder: Secondary | ICD-10-CM | POA: Diagnosis not present

## 2020-05-31 DIAGNOSIS — F0280 Dementia in other diseases classified elsewhere without behavioral disturbance: Secondary | ICD-10-CM

## 2020-05-31 MED ORDER — LOSARTAN POTASSIUM 100 MG PO TABS: 100.0000 mg | ORAL_TABLET | Freq: Every day | ORAL | 3 refills | Status: DC

## 2020-05-31 MED ORDER — MIRABEGRON ER 25 MG PO TB24: 25.0000 mg | ORAL_TABLET | Freq: Every day | ORAL | 2 refills | Status: DC

## 2020-05-31 NOTE — Patient Instructions (Signed)
Cut back tylenol to one at night. Check blood pressure three times a week and call if 160/90 or higher.

## 2020-06-01 NOTE — Progress Notes (Signed)
Location: [facility] Service:  Clinic (12)  Provider:   Code Status:  Goals of Care:  Advanced Directives 04/11/2020  Does Patient Have a Medical Advance Directive? Yes  Type of Paramedic of [place];Living will  Does patient want to make changes to medical advance directive? No - Patient declined  Copy of [place] in Chart? Yes - validated most recent copy scanned in chart (See row information)  Pre-existing out of facility DNR order (yellow form or pink MOST form) -     Chief Complaint  Patient presents with  . Acute Visit    Patient complains of frequent urination, no burning.     HPI: Patient is a 86 y.o. female seen today for an acute visit for to discuss frequency and urgency. Patient has h/o Hypertension, Hypothyroidism, Insomnia and Cognitive impairment. Also Has h/o Skin Cancer and Thyroid Cancer Patient lives with her husband in [place] and is independent in her ADLs.  Walks with her walker. Patient has history of cystocele has seen Dr. Phineas Real before.  He offered to use pessary but patient refused at that time.  Patient also has cognitive impairment and is difficult to get detailed history from her she keeps repeating herself.  Per husband she has been complaining to him that she needs to go to the bathroom every hour.  She is using depends and per him goes through the whole packet in 2 days.  Patient states that she does not have this issue when she is outside with her friends then participating in the activities.   It seems like this happens more when she is in her apartment.  She sometimes gets up 4-5 times at night and changes her depends.  Denies any dysuria fever abdominal pain.  It looks like this has been going on for a while. She did work with OT and want to do Kegel exercises.  It helped initially but is not helping anymore. Past Medical History:  Diagnosis Date  . Back pain   . Cancer [facility])    left  breaset mastectomy   . Candidiasis of skin and nails   . Cellulitis and abscess of upper arm and forearm   . Heartburn   . Hemorrhoids 08/20/2016  . History of breast cancer 2000   History left mastectomy  . 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 Hypertension   . Insomnia   . Knee pain, bilateral 2014  . Lymphedema    Left arm following mastectomy  . Memory impairment   . Memory loss   . Migraine 05/16/2012  . Migraine without aura, without mention of intractable migraine without mention of status migrainosus    patient denies at preop of 08/08/16   . Other and unspecified hyperlipidemia   . Other bursitis disorders   . Other infective bursitis, left shoulder   . Pain in joint, ankle and foot   . Pain in joint, hand   . Pain in joint, lower leg   . Pain in joint, shoulder region   . Pain in joint, shoulder region 04/23/2016  . Pain in limb   . Rectal bleeding 08/20/2016  . Senile osteoporosis   . Symptomatic menopausal or female climacteric states   . Syncope and collapse   . Unspecified hypertensive heart disease without heart failure 1995  . Unspecified hypothyroidism   . Unspecified polyarthropathy or polyarthritis, site unspecified   . Unspecified urinary incontinence   . Unspecified visual  loss   . Xerophthalmia     Past Surgical History:  Procedure Laterality Date  . ABDOMINAL HYSTERECTOMY    . BILATERAL OOPHORECTOMY  1981  . BREAST RECONSTRUCTION Left 01/2000  . BREAST REDUCTION SURGERY Right 01/2000  . CATARACT EXTRACTION Right 09/04/2005  . COLONOSCOPY  01/2004  . MASTECTOMY Left 10/1999  . THYROID LOBECTOMY Left 08/14/2016   Procedure: NEAR TOTAL THYROIDECTOMY;  Surgeon: Johnathan Hausen, MD;  Location: [facility];  Service: General;  Laterality: Left;  . TOTAL ABDOMINAL HYSTERECTOMY W/ BILATERAL SALPINGOOPHORECTOMY  1981  . TOTAL KNEE ARTHROPLASTY Right 2014    Allergies  Allergen Reactions  . Donepezil Hcl     Body pain and stiffness   . Namenda [Memantine  Hcl]      Caused body pain   . Sulfa Antibiotics   . Sulfamethoxazole Rash    Outpatient Encounter Medications as of 05/31/2020  Medication Sig  . acetaminophen (TYLENOL) 325 MG tablet Take 650 mg by mouth every 6 (six) hours as needed.  Marland Kitchen amLODipine (NORVASC) 2.5 MG tablet TAKE 1 TABLET BY MOUTH ONCE DAILY TO LOWER BLOOD PRESSURE  . amLODipine (NORVASC) 5 MG tablet Take 1 tablet (5 mg total) by mouth daily. Take with the 2.5mg  for a total of 7.5mg   . Apoaequorin (PREVAGEN PO) Take by mouth. Regular strength 1 after breakfast  . aspirin EC 81 MG tablet Take 81 mg by mouth daily.  . Calcium Carb-Cholecalciferol (CALCIUM 600-D PO) Take 1 tablet by mouth daily.  . Cholecalciferol 5000 units capsule Take 5,000 Units by mouth daily.  Marland Kitchen levothyroxine (SYNTHROID) 112 MCG tablet Take 1 tablet (112 mcg total) by mouth daily before breakfast.  . losartan-hydrochlorothiazide (HYZAAR) 100-12.5 MG tablet TAKE 1 TABLET BY MOUTH ONCE DAILY IN THE MORNING FOR BLOOD PRESSURE  . MELATONIN PO Take 4 mg by mouth at bedtime.   . meloxicam (MOBIC) 7.5 MG tablet Take 7.5 mg by mouth daily.  . Multiple Vitamins-Minerals (CENTRUM SILVER PO) Take 1 tablet by mouth daily.   . Omega-3 Fatty Acids (OMEGA-3 CF PO) Take 520 mg by mouth daily.   Vladimir Faster Glycol-Propyl Glycol (SYSTANE) 0.4-0.3 % SOLN Place into both eyes. Use two drops of Systane as needed for dry eyes  . trolamine salicylate (ASPERCREME) 10 % cream Apply 1 application topically as needed for muscle pain.  Marland Kitchen losartan (COZAAR) 100 MG tablet Take 1 tablet (100 mg total) by mouth daily.  . mirabegron ER (MYRBETRIQ) 25 MG TB24 tablet Take 1 tablet (25 mg total) by mouth daily.   No facility-administered encounter medications on file as of 05/31/2020.    Review of Systems:  Review of Systems  Constitutional: Negative.   HENT: Negative.   Respiratory: Negative.   Cardiovascular: Positive for leg swelling.  Gastrointestinal: Negative.   Genitourinary:  Positive for frequency and urgency. Negative for dysuria and pelvic pain.  Musculoskeletal: Negative.   Skin: Negative.   Neurological: Negative.   Psychiatric/Behavioral: Positive for confusion.    Health Maintenance  Topic Date Due  . INFLUENZA VACCINE  07/09/2020  . TETANUS/TDAP  10/30/2027  . DEXA SCAN  Completed  . COVID-19 Vaccine  Completed  . PNA vac Low Risk Adult  Completed    Physical Exam: Vitals:   05/31/20 1447  BP: 138/78  Pulse: 81  Temp: (!) 97.5 F (36.4 C)  SpO2: 97%  Weight: 168 lb 9.6 oz (76.5 kg)  Height: 5\' 4"  (1.626 m)   Body mass index is 28.94 kg/m. Physical Exam  Vitals reviewed.  Constitutional:      Appearance: Normal appearance.  HENT:     Head: Normocephalic.     Nose: Nose normal.     Mouth/Throat:     Mouth: Mucous membranes are moist.     Pharynx: Oropharynx is clear.  Eyes:     Pupils: Pupils are equal, round, and reactive to light.  Cardiovascular:     Rate and Rhythm: Normal rate and regular rhythm.     Pulses: Normal pulses.  Pulmonary:     Effort: Pulmonary effort is normal.     Breath sounds: Normal breath sounds.  Abdominal:     General: Abdomen is flat.     Palpations: Abdomen is soft.  Musculoskeletal:     Cervical back: Neck supple.     Comments: Mild edema Bilateral Left More then Right  Skin:    General: Skin is warm and dry.  Neurological:     General: No focal deficit present.     Mental Status: She is alert.  Psychiatric:        Mood and Affect: Mood normal.        Thought Content: Thought content normal.     Labs reviewed: Basic Metabolic Panel: Recent Labs    09/30/19 0000 12/31/19 0901 03/30/20 0828  NA 133* 131*  --   K 4.8 4.7  --   CL 97* 96*  --   CO2 22 26  --   GLUCOSE 94 92  --   BUN 17 18  --   CREATININE 0.75 0.71  --   CALCIUM 9.9 9.4  --   TSH 0.61 8.52* 0.84   Liver Function Tests: Recent Labs    09/30/19 0000 12/31/19 0901  AST 18 17  ALT 15 17  BILITOT 0.5 0.4    PROT 7.0 6.7   No results for input(s): LIPASE, AMYLASE in the last 8760 hours. No results for input(s): AMMONIA in the last 8760 hours. CBC: Recent Labs    09/30/19 0000 12/31/19 0901  WBC 6.0 6.0  NEUTROABS 3,348 3,306  HGB 13.2 11.8  HCT 39.4 35.7  MCV 87.9 90.2  PLT 401* 448*   Lipid Panel: Recent Labs    09/30/19 0000  CHOL 209*  HDL 61  LDLCALC 125*  TRIG 121  CHOLHDL 3.4   No results found for: HGBA1C  Procedures since last visit: No results found.  Assessment/Plan .Urinary Frequency Seems Mixed with both Stress and Overactive Bladder Does have h/o Cystocele Husband also think that it can be related to her Cognition She sometimes is getting up 4-5 times at night to change her Depends.  Does not do it when she is outside Was working with the Therapy for [place]. But it seems to not has helped Plan Cut back on HCTZ and just do Losartan  For now Also will try Myrbetriq and see if it helps Possible Urology Consult if no improvement They Have seen Dr Phineas Real before He did document Cystocele and Vaginal Prolapse.Can reconsider Pessary if she changes her mind Hypertension Since we are cutting back on her HCTZ Husband will check her BP 2-3 /Week Will call our office if SBP more then 160/90 Also will watch for worsening Swelling in her Legs  Right hip Pain Does not have any pain when I ask her today But she keeps asking for Tylenol at night  Husband wants to make sure 2 tylenol is ok to take Suggested to take 1 tylenol and see if it  works Can keep taking 2 if she needs it  Other issues  Late onset Alzheimer's 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 ([place]) Saw Neurology MMSE 24/30 which is stable Not on Any Meds as she has not toerated Hyperlipidemia, unspecified hyperlipidemia type Did not Tolerate Statins Generalized anxiety disorder No Meds Acute depression due to Loss of her daughter  Chronic midline low back pain without sciatica No Pain  anymore Hypothyroidism TSH normal now   Labs/tests ordered:  * No order type specified * Next appt:  07/27/2020  Total time spent in this patient care encounter was  45_  minutes; greater than 50% of the visit spent counseling patient and staff, reviewing records , Labs and coordinating care for problems addressed at this encounter.

## 2020-06-02 ENCOUNTER — Telehealth: Payer: Self-pay | Admitting: Neurology

## 2020-06-02 NOTE — Telephone Encounter (Signed)
Pt's husband called wanting to inform provider that some forms have been sent in to be filled out regarding the pt's Cognitive Impairment and he states this has to be filled out and turned back in by Friday July 2nd. Please advise.

## 2020-06-07 NOTE — Telephone Encounter (Signed)
Pt's husband called again wanting to know the update on this form and also would like to know if there is a charge so that it can be taken care of. Husband would also like a copy mailed out to him as well. Please advise.

## 2020-06-07 NOTE — Telephone Encounter (Signed)
Form found by medical. records.

## 2020-06-07 NOTE — Telephone Encounter (Signed)
Pt's husband has called 43 back to inform that the insurance company did receive the fax.  Pt mentioned the  $50.00 fee for the completion of the needed forms.  Husband is asking to be called back by [facility]

## 2020-06-07 NOTE — Telephone Encounter (Signed)
I called husband.   We have not received any p/w for her.  Last visit with SS/NP it shows f/u pcp or as needed basis.  I did not have anything to offier with NP or Dr. Jannifer Franklin. At this time.  He will contact [facility] about additional information and fax to [place].

## 2020-06-08 NOTE — Telephone Encounter (Signed)
done

## 2020-06-08 NOTE — Telephone Encounter (Addendum)
Completed and signed.  To [place].  Relayed to husband of pt and he would like a email copy.  Forward to [place].

## 2020-06-13 ENCOUNTER — Telehealth: Payer: Self-pay

## 2020-06-13 NOTE — Telephone Encounter (Signed)
Pt's husband is requesting a call from [place]. He said he spoke to [place] last week and want to speak to only her.

## 2020-06-14 NOTE — Telephone Encounter (Signed)
This patient has a Mini-Mental status examination of 24/30.  She requires assistance from her husband to manage her affairs, and for this reason meets the criteria for dementia, likely senile dementia of Alzheimer's type.

## 2020-06-14 NOTE — Telephone Encounter (Signed)
Cognitive questionaire was done last week. Diagnosis for this pt dementia/cognitive impairment would this be when we saw pt initially? 09-09-2018 (they are asking for date of diagnosis).

## 2020-06-14 NOTE — Telephone Encounter (Signed)
I called husband and he wanted emailed to him at [email or web address].  I relayed that addended the #2 question as did not place answer on that initially.

## 2020-06-14 NOTE — Telephone Encounter (Signed)
Called and no answer.  Will try later.

## 2020-06-15 NOTE — Telephone Encounter (Signed)
I emailed to husband of pt at the email listed below.  Original sent to [facility] in [place].

## 2020-06-19 ENCOUNTER — Other Ambulatory Visit: Payer: Self-pay | Admitting: Internal Medicine

## 2020-06-19 DIAGNOSIS — Z1231 Encounter for screening mammogram for malignant neoplasm of breast: Secondary | ICD-10-CM

## 2020-06-28 ENCOUNTER — Other Ambulatory Visit: Payer: Self-pay | Admitting: Internal Medicine

## 2020-06-28 NOTE — Telephone Encounter (Signed)
Received refill Request from pharmacy. In patient's chart it states to take daily.  Pended Rx and sent to [facility] for approval.

## 2020-07-18 ENCOUNTER — Ambulatory Visit: Payer: PPO | Admitting: Family

## 2020-07-25 ENCOUNTER — Other Ambulatory Visit: Payer: Self-pay | Admitting: Internal Medicine

## 2020-07-25 DIAGNOSIS — I10 Essential (primary) hypertension: Secondary | ICD-10-CM

## 2020-07-25 DIAGNOSIS — E785 Hyperlipidemia, unspecified: Secondary | ICD-10-CM

## 2020-07-25 DIAGNOSIS — E890 Postprocedural hypothyroidism: Secondary | ICD-10-CM

## 2020-07-26 DIAGNOSIS — I10 Essential (primary) hypertension: Secondary | ICD-10-CM | POA: Diagnosis not present

## 2020-07-26 DIAGNOSIS — E890 Postprocedural hypothyroidism: Secondary | ICD-10-CM | POA: Diagnosis not present

## 2020-07-26 DIAGNOSIS — E785 Hyperlipidemia, unspecified: Secondary | ICD-10-CM | POA: Diagnosis not present

## 2020-07-27 ENCOUNTER — Other Ambulatory Visit: Payer: Self-pay

## 2020-07-27 DIAGNOSIS — E785 Hyperlipidemia, unspecified: Secondary | ICD-10-CM

## 2020-07-27 DIAGNOSIS — I10 Essential (primary) hypertension: Secondary | ICD-10-CM

## 2020-07-27 DIAGNOSIS — E890 Postprocedural hypothyroidism: Secondary | ICD-10-CM

## 2020-07-27 LAB — COMPLETE METABOLIC PANEL WITH GFR
AG Ratio: 1.4 (calc) (ref 1.0–2.5)
ALT: 15 U/L (ref 6–29)
AST: 16 U/L (ref 10–35)
Albumin: 4 g/dL (ref 3.6–5.1)
Alkaline phosphatase (APISO): 73 U/L (ref 37–153)
BUN: 17 mg/dL (ref 7–25)
CO2: 24 mmol/L (ref 20–32)
Calcium: 9.6 mg/dL (ref 8.6–10.4)
Chloride: 100 mmol/L (ref 98–110)
Creat: 0.7 mg/dL (ref 0.60–0.88)
GFR, Est African American: 89 mL/min/{1.73_m2} (ref >=60)
GFR, Est Non African American: 77 mL/min/{1.73_m2} (ref >=60)
Globulin: 2.9 g/dL (calc) (ref 1.9–3.7)
Glucose, Bld: 87 mg/dL (ref 65–99)
Potassium: 4.6 mmol/L (ref 3.5–5.3)
Sodium: 134 mmol/L — ABNORMAL LOW (ref 135–146)
Total Bilirubin: 0.5 mg/dL (ref 0.2–1.2)
Total Protein: 6.9 g/dL (ref 6.1–8.1)

## 2020-07-27 LAB — CBC WITH DIFFERENTIAL/PLATELET
Absolute Monocytes: 590 cells/uL (ref 200–950)
Basophils Absolute: 50 cells/uL (ref 0–200)
Basophils Relative: 1 %
Eosinophils Absolute: 220 cells/uL (ref 15–500)
Eosinophils Relative: 4.4 %
HCT: 40 % (ref 35.0–45.0)
Hemoglobin: 13.4 g/dL (ref 11.7–15.5)
Lymphs Abs: 1715 cells/uL (ref 850–3900)
MCH: 30.6 pg (ref 27.0–33.0)
MCHC: 33.5 g/dL (ref 32.0–36.0)
MCV: 91.3 fL (ref 80.0–100.0)
MPV: 10.7 fL (ref 7.5–12.5)
Monocytes Relative: 11.8 %
Neutro Abs: 2425 cells/uL (ref 1500–7800)
Neutrophils Relative %: 48.5 %
Platelets: 345 10*3/uL (ref 140–400)
RBC: 4.38 10*6/uL (ref 3.80–5.10)
RDW: 12.9 % (ref 11.0–15.0)
Total Lymphocyte: 34.3 %
WBC: 5 10*3/uL (ref 3.8–10.8)

## 2020-07-27 LAB — LIPID PANEL
Cholesterol: 190 mg/dL (ref <200)
HDL: 56 mg/dL (ref >=50)
LDL Cholesterol (Calc): 109 mg/dL (calc) — ABNORMAL HIGH
Non-HDL Cholesterol (Calc): 134 mg/dL (calc) — ABNORMAL HIGH (ref <130)
Total CHOL/HDL Ratio: 3.4 (calc) (ref <5.0)
Triglycerides: 139 mg/dL (ref <150)

## 2020-07-27 LAB — TSH: TSH: 0.61 mIU/L (ref 0.40–4.50)

## 2020-07-31 ENCOUNTER — Ambulatory Visit: Payer: PPO

## 2020-08-01 ENCOUNTER — Other Ambulatory Visit: Payer: Self-pay

## 2020-08-01 ENCOUNTER — Ambulatory Visit
Admission: RE | Admit: 2020-08-01 | Discharge: 2020-08-01 | Disposition: A | Discharge status: Home / Self Care | Payer: PPO | Source: Ambulatory Visit | Attending: Internal Medicine | Admitting: Internal Medicine

## 2020-08-01 DIAGNOSIS — Z1231 Encounter for screening mammogram for malignant neoplasm of breast: Secondary | ICD-10-CM

## 2020-08-02 ENCOUNTER — Encounter: Payer: Self-pay | Admitting: Internal Medicine

## 2020-08-02 ENCOUNTER — Non-Acute Institutional Stay: Payer: PPO | Admitting: Internal Medicine

## 2020-08-02 ENCOUNTER — Other Ambulatory Visit: Payer: Self-pay

## 2020-08-02 VITALS — BP 144/76 | HR 81 | Temp 97.9°F | Ht 64.0 in | Wt 169.0 lb

## 2020-08-02 DIAGNOSIS — M545 Low back pain: Secondary | ICD-10-CM

## 2020-08-02 DIAGNOSIS — N3946 Mixed incontinence: Secondary | ICD-10-CM

## 2020-08-02 DIAGNOSIS — F411 Generalized anxiety disorder: Secondary | ICD-10-CM

## 2020-08-02 DIAGNOSIS — G301 Alzheimer's disease with late onset: Secondary | ICD-10-CM

## 2020-08-02 DIAGNOSIS — I10 Essential (primary) hypertension: Secondary | ICD-10-CM | POA: Diagnosis not present

## 2020-08-02 DIAGNOSIS — G8929 Other chronic pain: Secondary | ICD-10-CM

## 2020-08-02 DIAGNOSIS — E785 Hyperlipidemia, unspecified: Secondary | ICD-10-CM | POA: Diagnosis not present

## 2020-08-02 DIAGNOSIS — F0280 Dementia in other diseases classified elsewhere without behavioral disturbance: Secondary | ICD-10-CM | POA: Diagnosis not present

## 2020-08-02 MED ORDER — LOSARTAN POTASSIUM-HCTZ 100-12.5 MG PO TABS
1.0000 | ORAL_TABLET | Freq: Every day | ORAL | 3 refills | Status: DC
Start: 2020-08-02 — End: 2020-08-15

## 2020-08-02 NOTE — Patient Instructions (Signed)
Will restart Losartan with HCTZ for BP Continue Myrbetriq for now Call Dr Phineas Real office for Appointment

## 2020-08-02 NOTE — Progress Notes (Addendum)
Location: [place] of Service:  Clinic (12)  Provider:   Code Status:  Goals of Care:  Advanced Directives 04/11/2020  Does Patient Have a Medical Advance Directive? Yes  Type of Paramedic of [place];Living will  Does patient want to make changes to medical advance directive? No - Patient declined  Copy of [place] in Chart? Yes - validated most recent copy scanned in chart (See row information)  Pre-existing out of facility DNR order (yellow form or pink MOST form) -     Chief Complaint  Patient presents with  . Medical Management of Chronic Issues    HPI: Patient is a 86 y.o. female seen today for an Routine visit for  Patient has h/o Hypertension, Hypothyroidism, Insomnia and Cognitive impairment. Also Has h/o Skin Cancer and Thyroid Cancer Patient lives with her husband in [place] and is independent in her ADLs.  Walks with her walker.  Urinary incontinence Mixed both stress urge incontinence Seen Dr. Phineas Real for cystocele.  They want to know if they can go back to him and consider pessary. On Myrbetriq . Some improvement Also had changed the Hyzaar to just plain losartan. Dementia Continues to be independent in her ADLs.  But needs a lot of support from her husband.  For her medications and reminders Depression Due to loss of her daughter She says she sometimes feels sad but mostly stays in good spirits   Past Medical History:  Diagnosis Date  . Back pain   . [facility])    left breaset mastectomy   . Candidiasis of skin and nails   . Cellulitis and abscess of upper arm and forearm   . Heartburn   . Hemorrhoids 08/20/2016  . History of breast cancer 2000   History left mastectomy  . 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 Hypertension   . Insomnia   . Knee pain, bilateral 2014  . Lymphedema    Left arm following mastectomy  . Memory impairment   . Memory loss   . Migraine 05/16/2012  .  Migraine without aura, without mention of intractable migraine without mention of status migrainosus    patient denies at preop of 08/08/16   . Other and unspecified hyperlipidemia   . Other bursitis disorders   . Other infective bursitis, left shoulder   . Pain in joint, ankle and foot   . Pain in joint, hand   . Pain in joint, lower leg   . Pain in joint, shoulder region   . Pain in joint, shoulder region 04/23/2016  . Pain in limb   . Rectal bleeding 08/20/2016  . Senile osteoporosis   . Symptomatic menopausal or female climacteric states   . Syncope and collapse   . Unspecified hypertensive heart disease without heart failure 1995  . Unspecified hypothyroidism   . Unspecified polyarthropathy or polyarthritis, site unspecified   . Unspecified urinary incontinence   . Unspecified visual loss   . Xerophthalmia     Past Surgical History:  Procedure Laterality Date  . ABDOMINAL HYSTERECTOMY    . BILATERAL OOPHORECTOMY  1981  . BREAST RECONSTRUCTION Left 01/2000  . BREAST REDUCTION SURGERY Right 01/2000  . CATARACT EXTRACTION Right 09/04/2005  . COLONOSCOPY  01/2004  . MASTECTOMY Left 10/1999  . THYROID LOBECTOMY Left 08/14/2016   Procedure: NEAR TOTAL THYROIDECTOMY;  Surgeon: Johnathan Hausen, MD;  Location: [facility];  Service: General;  Laterality: Left;  . TOTAL ABDOMINAL HYSTERECTOMY  W/ BILATERAL SALPINGOOPHORECTOMY  1981  . TOTAL KNEE ARTHROPLASTY Right 2014    Allergies  Allergen Reactions  . Donepezil Hcl     Body pain and stiffness   . Namenda [Memantine Hcl]      Caused body pain   . Sulfa Antibiotics   . Sulfamethoxazole Rash    Outpatient Encounter Medications as of 08/02/2020  Medication Sig  . acetaminophen (TYLENOL) 325 MG tablet Take 650 mg by mouth every 6 (six) hours as needed.  Marland Kitchen amLODipine (NORVASC) 2.5 MG tablet TAKE 1 TABLET BY MOUTH ONCE DAILY TO LOWER BLOOD PRESSURE  . amLODipine (NORVASC) 5 MG tablet Take 1 tablet (5 mg total) by mouth daily. Take with the  2.5mg  for a total of 7.5mg   . Apoaequorin (PREVAGEN PO) Take by mouth. Regular strength 1 after breakfast  . aspirin EC 81 MG tablet Take 81 mg by mouth daily.  . Calcium Carb-Cholecalciferol (CALCIUM 600-D PO) Take 1 tablet by mouth daily.  . Cholecalciferol 5000 units capsule Take 5,000 Units by mouth daily.  Marland Kitchen levothyroxine (SYNTHROID) 112 MCG tablet Take 1 tablet (112 mcg total) by mouth daily before breakfast.  . MELATONIN PO Take 4 mg by mouth at bedtime.   . meloxicam (MOBIC) 7.5 MG tablet TAKE 1 TABLET BY MOUTH ONCE DAILY AS NEEDED FOR UP TO 15 DAYS FOR PAIN. TAKE WITH FOOD AS NEEDED FOR MODERATE PAIN  . mirabegron ER (MYRBETRIQ) 25 MG TB24 tablet Take 1 tablet (25 mg total) by mouth daily.  . Multiple Vitamins-Minerals (CENTRUM SILVER PO) Take 1 tablet by mouth daily.   . Omega-3 Fatty Acids (OMEGA-3 CF PO) Take 520 mg by mouth daily.   Vladimir Faster Glycol-Propyl Glycol (SYSTANE) 0.4-0.3 % SOLN Place into both eyes. Use two drops of Systane as needed for dry eyes  . trolamine salicylate (ASPERCREME) 10 % cream Apply 1 application topically as needed for muscle pain.  . [DISCONTINUED] losartan (COZAAR) 100 MG tablet Take 1 tablet (100 mg total) by mouth daily.  . [DISCONTINUED] losartan-hydrochlorothiazide (HYZAAR) 100-12.5 MG tablet TAKE 1 TABLET BY MOUTH ONCE DAILY IN THE MORNING FOR BLOOD PRESSURE  . losartan-hydrochlorothiazide (HYZAAR) 100-12.5 MG tablet Take 1 tablet by mouth daily.   No facility-administered encounter medications on file as of 08/02/2020.    Review of Systems:  Review of Systems  Constitutional: Negative.   HENT: Negative.   Respiratory: Negative.   Cardiovascular: Positive for leg swelling.  Gastrointestinal: Negative.   Genitourinary: Positive for frequency and urgency.  Musculoskeletal: Positive for gait problem.  Neurological: Negative for dizziness.  Psychiatric/Behavioral: Positive for confusion.  All other systems reviewed and are  negative.   Health Maintenance  Topic Date Due  . INFLUENZA VACCINE  07/09/2020  . TETANUS/TDAP  10/30/2027  . DEXA SCAN  Completed  . COVID-19 Vaccine  Completed  . PNA vac Low Risk Adult  Completed    Physical Exam: Vitals:   08/02/20 1438  BP: (!) 144/76  Pulse: 81  Temp: 97.9 F (36.6 C)  SpO2: 97%  Weight: 169 lb (76.7 kg)  Height: 5\' 4"  (1.626 m)   Body mass index is 29.01 kg/m. Physical Exam  Constitutional:  Well-developed and well-nourished.  HENT:  Head: Normocephalic.  Mouth/Throat: Oropharynx is clear and moist.  Eyes: Pupils are equal, round, and reactive to light.  Neck: Neck supple.  Cardiovascular: Normal rate and normal heart sounds.  No murmur heard. Pulmonary/Chest: Effort normal and breath sounds normal. No respiratory distress. No wheezes. She has  no rales.  Abdominal: Soft. Bowel sounds are normal. No distension. There is no tenderness. There is no rebound.  Musculoskeletal:Mild edema Bilateral Lymphadenopathy: none Neurological: No Focal Deficits Skin: Skin is warm and dry.  Psychiatric: Normal mood and affect. Behavior is normal. Thought content normal.    Labs reviewed: Basic Metabolic Panel: Recent Labs    09/30/19 0000 09/30/19 0000 12/31/19 0901 03/30/20 0828 07/26/20 0852  NA 133*  --  131*  --  134*  K 4.8  --  4.7  --  4.6  CL 97*  --  96*  --  100  CO2 22  --  26  --  24  GLUCOSE 94  --  92  --  87  BUN 17  --  18  --  17  CREATININE 0.75  --  0.71  --  0.70  CALCIUM 9.9  --  9.4  --  9.6  TSH 0.61   < > 8.52* 0.84 0.61   < > = values in this interval not displayed.   Liver Function Tests: Recent Labs    09/30/19 0000 12/31/19 0901 07/26/20 0852  AST 18 17 16   ALT 15 17 15   BILITOT 0.5 0.4 0.5  PROT 7.0 6.7 6.9   No results for input(s): LIPASE, AMYLASE in the last 8760 hours. No results for input(s): AMMONIA in the last 8760 hours. CBC: Recent Labs    09/30/19 0000 12/31/19 0901 07/26/20 0852  WBC 6.0  6.0 5.0  NEUTROABS 3,348 3,306 2,425  HGB 13.2 11.8 13.4  HCT 39.4 35.7 40.0  MCV 87.9 90.2 91.3  PLT 401* 448* 345   Lipid Panel: Recent Labs    09/30/19 0000 07/26/20 0852  CHOL 209* 190  HDL 61 56  LDLCALC 125* 109*  TRIG 121 139  CHOLHDL 3.4 3.4   No results found for: HGBA1C  Procedures since last visit: MM 3D SCREEN BREAST UNI RIGHT  Result Date: 08/01/2020 CLINICAL DATA:  Screening. EXAM: DIGITAL SCREENING UNILATERAL RIGHT MAMMOGRAM WITH CAD AND TOMO COMPARISON:  Previous exam(s). ACR Breast Density Category a: The breast tissue is almost entirely fatty. FINDINGS: The patient has had a left mastectomy. There are no findings suspicious for malignancy. Images were processed with CAD. IMPRESSION: No mammographic evidence of malignancy. A result letter of this screening mammogram will be mailed directly to the patient. RECOMMENDATION: Screening mammogram in one year.  (Code:SM-R-68M) BI-RADS CATEGORY  1: Negative. Electronically Signed   By: Ammie Ferrier M.D.   On: 08/01/2020 16:09    Assessment/Plan Essential hypertension Restart Hyzaar again due to Mild increase in BP and LE edema  Mixed stress and urge urinary incontinence Continue Myrbetriq Refer to Dr Phineas Real Late onset Alzheimer's 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 ([place]) Sees Neurology MMSE 24/30 Not on Any meds Not tolerated Hyperlipidemia, unspecified hyperlipidemia type LDL improved Did not tolerate Statins Generalized anxiety disorder Not on any Meds Chronic midline low back pain without sciatica Tylenol PRN Hypothyroidism TSH normal now Mild Hyponatremia Continue to monitor  Labs/tests ordered:  * No order type specified * Next appt:  11/01/2020

## 2020-08-07 ENCOUNTER — Other Ambulatory Visit: Payer: Self-pay

## 2020-08-07 ENCOUNTER — Encounter: Payer: Self-pay | Admitting: Obstetrics and Gynecology

## 2020-08-07 ENCOUNTER — Ambulatory Visit: Payer: PPO | Admitting: Obstetrics and Gynecology

## 2020-08-07 VITALS — BP 124/80

## 2020-08-07 DIAGNOSIS — N8111 Cystocele, midline: Secondary | ICD-10-CM | POA: Diagnosis not present

## 2020-08-07 DIAGNOSIS — N819 Female genital prolapse, unspecified: Secondary | ICD-10-CM | POA: Diagnosis not present

## 2020-08-07 NOTE — Progress Notes (Signed)
Frances Holland Apr 27, 1929 [id number]  SUBJECTIVE:  86 y.o. G3P3 female presents for evaluation of a cystocele.  She was previously seen in this office regarding this issue last year.  She decided on not doing anything for management at that time.  Her husband is with her today and he feels that her urinary function has been getting worse.  She is having more urinary leakage.  She feels the prolapse has further descended, but she indicates this does not bother her because she is able to reduce it herself.   Current Outpatient Medications  Medication Sig Dispense Refill  . acetaminophen (TYLENOL) 325 MG tablet Take 650 mg by mouth every 6 (six) hours as needed.    Marland Kitchen amLODipine (NORVASC) 2.5 MG tablet TAKE 1 TABLET BY MOUTH ONCE DAILY TO LOWER BLOOD PRESSURE 90 tablet 1  . amLODipine (NORVASC) 5 MG tablet Take 1 tablet (5 mg total) by mouth daily. Take with the 2.5mg  for a total of 7.5mg  90 tablet 1  . Apoaequorin (PREVAGEN PO) Take by mouth. Regular strength 1 after breakfast    . aspirin EC 81 MG tablet Take 81 mg by mouth daily.    . Calcium Carb-Cholecalciferol (CALCIUM 600-D PO) Take 1 tablet by mouth daily.    . Cholecalciferol 5000 units capsule Take 5,000 Units by mouth daily.    Marland Kitchen levothyroxine (SYNTHROID) 112 MCG tablet Take 1 tablet (112 mcg total) by mouth daily before breakfast. 90 tablet 1  . losartan-hydrochlorothiazide (HYZAAR) 100-12.5 MG tablet Take 1 tablet by mouth daily. 90 tablet 3  . MELATONIN PO Take 4 mg by mouth at bedtime.     . meloxicam (MOBIC) 7.5 MG tablet TAKE 1 TABLET BY MOUTH ONCE DAILY AS NEEDED FOR UP TO 15 DAYS FOR PAIN. TAKE WITH FOOD AS NEEDED FOR MODERATE PAIN 15 tablet 0  . mirabegron ER (MYRBETRIQ) 25 MG TB24 tablet Take 1 tablet (25 mg total) by mouth daily. 30 tablet 2  . Multiple Vitamins-Minerals (CENTRUM SILVER PO) Take 1 tablet by mouth daily.     . Omega-3 Fatty Acids (OMEGA-3 CF PO) Take 520 mg by mouth daily.     Vladimir Faster Glycol-Propyl Glycol  (SYSTANE) 0.4-0.3 % SOLN Place into both eyes. Use two drops of Systane as needed for dry eyes    . trolamine salicylate (ASPERCREME) 10 % cream Apply 1 application topically as needed for muscle pain.     No current facility-administered medications for this visit.   Allergies: Donepezil hcl, Namenda [memantine hcl], Sulfa antibiotics, and Sulfamethoxazole  No LMP recorded. Patient has had a hysterectomy.  Past medical history,surgical history, problem list, medications, allergies, family history and social history were all reviewed and documented as reviewed in the EPIC chart.  Directed ROS with pertinent positives and negatives documented in the history of present illness/assessment and plan.   OBJECTIVE:  BP 124/80  The patient appears well, alert, oriented x 3, in no distress.  PELVIC EXAM: Upon inspection of the perineum and vulva dorsal supine position, there is noted to be complete procidentia involving a cystocele and enterocele/vaginal cuff prolapse with no appreciable rectocele.  The prolapse is easily reducible.  Chaperone: [name] present during the examination  ASSESSMENT:  86 y.o. 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  I offered to perform a pessary fitting today she declined citing that she  thinks she is managing okay on her own and she feels comfortable with her voiding function and bowel movements.  We discussed that complete procidentia of the bladder can lead to possible emergency in the way of inability to empty the bladder if the prolapse were to suddenly not be able to be reduced at home and that can result in the need for emergent care and possible damage to the bladder and kidneys due to over distention of the bladder.  Certainly if she changes her mind we are here and we can perform a pessary fitting at that  time.  [name] MD 08/07/20

## 2020-08-08 DIAGNOSIS — H35033 Hypertensive retinopathy, bilateral: Secondary | ICD-10-CM | POA: Diagnosis not present

## 2020-08-10 ENCOUNTER — Emergency Department (HOSPITAL_COMMUNITY)
Admission: EM | Admit: 2020-08-10 | Discharge: 2020-08-10 | Disposition: A | Discharge status: Home / Self Care | Payer: PPO | Attending: Emergency Medicine | Admitting: Emergency Medicine

## 2020-08-10 ENCOUNTER — Encounter (HOSPITAL_COMMUNITY): Payer: Self-pay | Admitting: Emergency Medicine

## 2020-08-10 DIAGNOSIS — Z7982 Long term (current) use of aspirin: Secondary | ICD-10-CM | POA: Diagnosis not present

## 2020-08-10 DIAGNOSIS — Z96651 Presence of right artificial knee joint: Secondary | ICD-10-CM | POA: Diagnosis not present

## 2020-08-10 DIAGNOSIS — Z853 Personal history of malignant neoplasm of breast: Secondary | ICD-10-CM | POA: Insufficient documentation

## 2020-08-10 DIAGNOSIS — N814 Uterovaginal prolapse, unspecified: Secondary | ICD-10-CM

## 2020-08-10 DIAGNOSIS — R319 Hematuria, unspecified: Secondary | ICD-10-CM | POA: Diagnosis not present

## 2020-08-10 DIAGNOSIS — Z79899 Other long term (current) drug therapy: Secondary | ICD-10-CM | POA: Diagnosis not present

## 2020-08-10 DIAGNOSIS — N8110 Cystocele, unspecified: Secondary | ICD-10-CM | POA: Diagnosis not present

## 2020-08-10 DIAGNOSIS — F0390 Unspecified dementia without behavioral disturbance: Secondary | ICD-10-CM | POA: Insufficient documentation

## 2020-08-10 DIAGNOSIS — E039 Hypothyroidism, unspecified: Secondary | ICD-10-CM | POA: Diagnosis not present

## 2020-08-10 DIAGNOSIS — R58 Hemorrhage, not elsewhere classified: Secondary | ICD-10-CM | POA: Diagnosis not present

## 2020-08-10 DIAGNOSIS — I10 Essential (primary) hypertension: Secondary | ICD-10-CM | POA: Insufficient documentation

## 2020-08-10 DIAGNOSIS — Z79890 Hormone replacement therapy (postmenopausal): Secondary | ICD-10-CM | POA: Insufficient documentation

## 2020-08-10 LAB — BASIC METABOLIC PANEL
Anion gap: 11 (ref 5–15)
BUN: 18 mg/dL (ref 8–23)
CO2: 24 mmol/L (ref 22–32)
Calcium: 9.7 mg/dL (ref 8.9–10.3)
Chloride: 96 mmol/L — ABNORMAL LOW (ref 98–111)
Creatinine, Ser: 0.67 mg/dL (ref 0.44–1.00)
GFR calc Af Amer: >60 mL/min (ref >60)
GFR calc non Af Amer: >60 mL/min (ref >60)
Glucose, Bld: 103 mg/dL — ABNORMAL HIGH (ref 70–99)
Potassium: 4.2 mmol/L (ref 3.5–5.1)
Sodium: 131 mmol/L — ABNORMAL LOW (ref 135–145)

## 2020-08-10 LAB — CBC WITH DIFFERENTIAL/PLATELET
Abs Immature Granulocytes: 0.04 10*3/uL (ref 0.00–0.07)
Basophils Absolute: 0.1 10*3/uL (ref 0.0–0.1)
Basophils Relative: 0 %
Eosinophils Absolute: 0.1 10*3/uL (ref 0.0–0.5)
Eosinophils Relative: 1 %
HCT: 39.5 % (ref 36.0–46.0)
Hemoglobin: 13.7 g/dL (ref 12.0–15.0)
Immature Granulocytes: 0 %
Lymphocytes Relative: 10 %
Lymphs Abs: 1.2 10*3/uL (ref 0.7–4.0)
MCH: 31.1 pg (ref 26.0–34.0)
MCHC: 34.7 g/dL (ref 30.0–36.0)
MCV: 89.8 fL (ref 80.0–100.0)
Monocytes Absolute: 0.9 10*3/uL (ref 0.1–1.0)
Monocytes Relative: 8 %
Neutro Abs: 9.4 10*3/uL — ABNORMAL HIGH (ref 1.7–7.7)
Neutrophils Relative %: 81 %
Platelets: 311 10*3/uL (ref 150–400)
RBC: 4.4 MIL/uL (ref 3.87–5.11)
RDW: 12.5 % (ref 11.5–15.5)
WBC: 11.7 10*3/uL — ABNORMAL HIGH (ref 4.0–10.5)
nRBC: 0 % (ref 0.0–0.2)

## 2020-08-10 LAB — URINALYSIS, ROUTINE W REFLEX MICROSCOPIC

## 2020-08-10 LAB — PROTIME-INR
INR: 1 (ref 0.8–1.2)
Prothrombin Time: 12.7 seconds (ref 11.4–15.2)

## 2020-08-10 LAB — URINALYSIS, MICROSCOPIC (REFLEX): RBC / HPF: >50 RBC/hpf (ref 0–5)

## 2020-08-10 MED ORDER — ACETAMINOPHEN 500 MG PO TABS
1000.0000 mg | ORAL_TABLET | Freq: Once | ORAL | Status: AC
  Administered 2020-08-10: 1000 mg via ORAL
  Filled 2020-08-10: qty 2

## 2020-08-10 NOTE — ED Notes (Signed)
Pt placed on purewick at 50 mmHg. 

## 2020-08-10 NOTE — ED Notes (Signed)
Notified lab to send up results on BMP.

## 2020-08-10 NOTE — ED Provider Notes (Signed)
[facility] DEPT Provider Note   CSN: [id number] Arrival date & time: 08/10/20  1334     History No chief complaint on file.   Frances Holland is a 86 y.o. female.  The history is provided by the patient and medical records. No language interpreter was used.     86 year old female with remote hx of breast CA s/p mastectomy, dementia, prolapse of the bladder, brought here via EMS from [facility] for evaluation of hematuria.  This morning when patient went to use the bathroom she noticed blood on her adult briefs.  She notified it to her husband who noticed 3 spots of blood on her adult diaper that she wears on a regular basis.  Patient also mention that she has recurrent bladder prolapse that she normally able to apply pressure to reduce it but this time she was unable to reduce it.  She also endorsed mild urinary urgency without burning urination.  She endorsed mild lower abdominal pain.  She does not claim any fever chills no nausea or vomiting no back pain or flank pain no change in bowel movement no chest pain or shortness of breath.  She was seen by her gynecologist several days ago for her recurrent bladder prolapse and was recommended to wear a pessary but at that time patient was not interested.  She denies any unintentional weight changes, night sweats, or fever.  She endorsed some mild weight gain from change in her diet.  Patient has had prior hysterectomy.    Past Medical History:  Diagnosis Date  . Back pain   . Cancer [facility])    left breaset mastectomy   . Candidiasis of skin and nails   . Cellulitis and abscess of upper arm and forearm   . Heartburn   . Hemorrhoids 08/20/2016  . History of breast cancer 2000   History left mastectomy  . 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 Hypertension   . Insomnia   . Knee pain, bilateral 2014  . Lymphedema    Left arm following mastectomy  . Memory impairment   . Memory loss   . Migraine  05/16/2012  . Migraine without aura, without mention of intractable migraine without mention of status migrainosus    patient denies at preop of 08/08/16   . Other and unspecified hyperlipidemia   . Other bursitis disorders   . Other infective bursitis, left shoulder   . Pain in joint, ankle and foot   . Pain in joint, hand   . Pain in joint, lower leg   . Pain in joint, shoulder region   . Pain in joint, shoulder region 04/23/2016  . Pain in limb   . Rectal bleeding 08/20/2016  . Senile osteoporosis   . Symptomatic menopausal or female climacteric states   . Syncope and collapse   . Unspecified hypertensive heart disease without heart failure 1995  . Unspecified hypothyroidism   . Unspecified polyarthropathy or polyarthritis, site unspecified   . Unspecified urinary incontinence   . Unspecified visual loss   . Xerophthalmia     Patient Active Problem List   Diagnosis Date Noted  . Counseling regarding advanced care planning and goals of care 04/12/2020  . Dementia without behavioral disturbance ([place]) 04/04/2020  . Acute depression due to Loss of her daughter 12/29/2019  . Recurrent falls 12/29/2019  . Prolapse of vaginal wall with midline cystocele 10/07/2019  . Cognitive impairment 02/17/2019  . Prolapse of uterus 08/13/2017  . Grief  at loss of child 08/13/2017  . Chronic bilateral low back pain without sciatica 08/13/2017  . Skin cancer 11/26/2016  . Cyst of skin 10/01/2016  . 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 Rectal bleeding 08/20/2016  . Constipation 08/20/2016  . Hemorrhoids 08/20/2016  . 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 Thyroid nodule 05/14/2016  . Pain in joint, shoulder region 04/23/2016  . History of breast cancer   . Knee pain, bilateral   . Hypertension   . Heartburn   . Hyperlipidemia   . Senile osteoporosis   . Hypothyroidism   . Urinary incontinence   . Insomnia   . Xerophthalmia   . Gonalgia 09/13/2014  . Migraine 05/16/2012      Past Surgical History:  Procedure Laterality Date  . ABDOMINAL HYSTERECTOMY    . BILATERAL OOPHORECTOMY  1981  . BREAST RECONSTRUCTION Left 01/2000  . BREAST REDUCTION SURGERY Right 01/2000  . CATARACT EXTRACTION Right 09/04/2005  . COLONOSCOPY  01/2004  . MASTECTOMY Left 10/1999  . THYROID LOBECTOMY Left 08/14/2016   Procedure: NEAR TOTAL THYROIDECTOMY;  Surgeon: Johnathan Hausen, MD;  Location: [facility] ORS;  Service: General;  Laterality: Left;  . TOTAL ABDOMINAL HYSTERECTOMY W/ BILATERAL SALPINGOOPHORECTOMY  1981  . TOTAL KNEE ARTHROPLASTY Right 2014     OB History    Gravida  3   Para  3   Term      Preterm      AB      Living  3     SAB      TAB      Ectopic      Multiple      Live Births              Family History  Problem Relation Age of Onset  . Heart disease Mother   . Dementia Mother   . Dementia Father   . Cancer Daughter   . Asthma Son     Social History   Tobacco Use  . Smoking status: Never Smoker  . Smokeless tobacco: Never Used  Vaping Use  . Vaping Use: Never used  Substance Use Topics  . Alcohol use: No  . Drug use: No    Home Medications Prior to Admission medications   Medication Sig Start Date End Date Taking? Authorizing Provider  acetaminophen (TYLENOL) 325 MG tablet Take 650 mg by mouth every 6 (six) hours as needed.    [provider]  amLODipine (NORVASC) 2.5 MG tablet TAKE 1 TABLET BY MOUTH ONCE DAILY TO LOWER BLOOD PRESSURE 03/28/20   Virgie Dad, MD  amLODipine (NORVASC) 5 MG tablet Take 1 tablet (5 mg total) by mouth daily. Take with the 2.5mg  for a total of 7.5mg  03/28/20   Virgie Dad, MD  Apoaequorin (PREVAGEN PO) Take by mouth. Regular strength 1 after breakfast    [provider]  aspirin EC 81 MG tablet Take 81 mg by mouth daily.    [provider]  Calcium Carb-Cholecalciferol (CALCIUM 600-D PO) Take 1 tablet by mouth daily.    [provider]  Cholecalciferol 5000 units  capsule Take 5,000 Units by mouth daily.    [provider]  levothyroxine (SYNTHROID) 112 MCG tablet Take 1 tablet (112 mcg total) by mouth daily before breakfast. 07/05/19   Virgie Dad, MD  losartan-hydrochlorothiazide (HYZAAR) 100-12.5 MG tablet Take 1 tablet by mouth daily. 08/02/20   Virgie Dad, MD  MELATONIN PO Take 4 mg by mouth  at bedtime.     [provider]  meloxicam (MOBIC) 7.5 MG tablet TAKE 1 TABLET BY MOUTH ONCE DAILY AS NEEDED FOR UP TO 15 DAYS FOR PAIN. TAKE WITH FOOD AS NEEDED FOR MODERATE PAIN 06/28/20   Mast, Man X, NP  mirabegron ER (MYRBETRIQ) 25 MG TB24 tablet Take 1 tablet (25 mg total) by mouth daily. 05/31/20   Virgie Dad, MD  Multiple Vitamins-Minerals (CENTRUM SILVER PO) Take 1 tablet by mouth daily.     [provider]  Omega-3 Fatty Acids (OMEGA-3 CF PO) Take 520 mg by mouth daily.     [provider]  Polyethyl Glycol-Propyl Glycol (SYSTANE) 0.4-0.3 % SOLN Place into both eyes. Use two drops of Systane as needed for dry eyes    [provider]  trolamine salicylate (ASPERCREME) 10 % cream Apply 1 application topically as needed for muscle pain.    [provider]    Allergies    Donepezil hcl, Namenda [memantine hcl], Sulfa antibiotics, and Sulfamethoxazole  Review of Systems   Review of Systems  All other systems reviewed and are negative.   Physical Exam Updated Vital Signs BP (!) 161/75 (BP Location: Right Arm)   Pulse 96   Temp 97.8 F (36.6 C) (Oral)   Resp 20   Ht 5\' 4"  (1.626 m)   Wt 76.7 kg   SpO2 98%   BMI 29.01 kg/m   Physical Exam Vitals and nursing note reviewed.  Constitutional:      General: She is not in acute distress.    Appearance: She is well-developed.  HENT:     Head: Atraumatic.  Eyes:     Conjunctiva/sclera: Conjunctivae normal.  Cardiovascular:     Rate and Rhythm: Normal rate and regular rhythm.     Pulses: Normal pulses.     Heart sounds: Normal heart  sounds.  Pulmonary:     Breath sounds: Normal breath sounds.  Abdominal:     Palpations: Abdomen is soft.     Tenderness: There is no abdominal tenderness.  Genitourinary:    Comments: Lovena Le, PA-S, was available for chaperone.  Mild external examination of the GU, patient has an obvious bladder prolapse, with dry appearing skin, and small amount of blood noted on her adult briefs without any obvious active bleeding.  Bladder prolapse easily reducible with direct pressure. Musculoskeletal:     Cervical back: Neck supple.  Skin:    Findings: No rash.  Neurological:     Mental Status: She is alert.     ED Results / Procedures / Treatments   Labs (all labs ordered are listed, but only abnormal results are displayed) Labs Reviewed  CBC WITH DIFFERENTIAL/PLATELET - Abnormal; Notable for the following components:      Result Value   WBC 11.7 (*)    Neutro Abs 9.4 (*)    All other components within normal limits  URINALYSIS, ROUTINE W REFLEX MICROSCOPIC - Abnormal; Notable for the following components:   Color, Urine RED (*)    APPearance TURBID (*)    Glucose, UA   (*)    Value: TEST NOT REPORTED DUE TO COLOR INTERFERENCE OF URINE PIGMENT   Hgb urine dipstick   (*)    Value: TEST NOT REPORTED DUE TO COLOR INTERFERENCE OF URINE PIGMENT   Bilirubin Urine   (*)    Value: TEST NOT REPORTED DUE TO COLOR INTERFERENCE OF URINE PIGMENT   Ketones, ur   (*)    Value: TEST NOT  REPORTED DUE TO COLOR INTERFERENCE OF URINE PIGMENT   Protein, ur   (*)    Value: TEST NOT REPORTED DUE TO COLOR INTERFERENCE OF URINE PIGMENT   Nitrite   (*)    Value: TEST NOT REPORTED DUE TO COLOR INTERFERENCE OF URINE PIGMENT   Leukocytes,Ua   (*)    Value: TEST NOT REPORTED DUE TO COLOR INTERFERENCE OF URINE PIGMENT   All other components within normal limits  URINALYSIS, MICROSCOPIC (REFLEX) - Abnormal; Notable for the following components:   Bacteria, UA RARE (*)    All other components within normal limits    PROTIME-INR  BASIC METABOLIC PANEL    EKG None  Radiology No results found.  Procedures Procedures (including critical care time)  Medications Ordered in ED Medications  acetaminophen (TYLENOL) tablet 1,000 mg (has no administration in time range)    ED Course  I have reviewed the triage vital signs and the nursing notes.  Pertinent labs & imaging results that were available during my care of the patient were reviewed by me and considered in my medical decision making (see chart for details).    MDM Rules/Calculators/A&P                          BP (!) 148/95 (BP Location: Right Arm)   Pulse 90   Temp 97.8 F (36.6 C) (Oral)   Resp 18   Ht 5\' 4"  (1.626 m)   Wt 76.7 kg   SpO2 98%   BMI 29.01 kg/m   Final Clinical Impression(s) / ED Diagnoses Final diagnoses:  Cystocele with prolapse    Rx / DC Orders ED Discharge Orders    None     2:42 PM Patient with history of bladder prolapse here with blood in her adult diaper that was noted this morning.  She was unable to reduce her prolapse after urinating.  She did endorse some mild urinary urgency without any significant pain or burning sensation when urinating.  Initial UA obtained is obscured by blood.  There is 11-20 WBC however there is also 11-20 squamous epithelial cells.  I have low suspicion for UTI causing her symptoms.  I suspect this is likely due to an irritated bladder prolapse.  She has fairly benign abdominal exam.  She is overall well-appearing.  I felt patient would benefit from having a pessary as a form of treatment of her prolapse.  Will check basic labs.  4:27 PM Labs are reassuring.  Patient requested for some pain medication for her chronic right knee pain.  We will give some Tylenol.  Otherwise encourage patient to follow-up with her gynecologist to try pessary.  Patient is in agreement.  She understands to return if her symptoms worsen.  She has a benign abdominal exam.  Care discussed with Dr.  Vanita Panda.   Domenic Moras, PA-C 08/10/20 1634    Carmin Muskrat, MD 08/16/20 [phone]

## 2020-08-10 NOTE — ED Triage Notes (Addendum)
Arrives via EMS from [facility], C/C  Around 12:20 her husband noticed some blood on her depends, had 3 briefs that had some blood on them. Increased urge to urinate, provided sample and it appears to be blood. No complaints of pain.

## 2020-08-10 NOTE — Discharge Instructions (Signed)
The blood that you noticed is likely due to irritation from your bladder prolapse.  Please follow-up with your specialist for further evaluation and likely utilization of the pessary to aid with this problem.  Return to the ER if you have any concern.

## 2020-08-11 ENCOUNTER — Telehealth: Payer: Self-pay

## 2020-08-11 NOTE — Telephone Encounter (Signed)
Patients husband aware and verbalized understanding  

## 2020-08-11 NOTE — Telephone Encounter (Signed)
Patients husband called and left message on voicemail to question the time frame of 2 scheduled vaccine appointments.  Frances Holland is scheduled for 1.) Flu vaccine on 08/29/2020 2.) 3rd covid vaccine on 09/08/2020  Patient can only get injections in right arm due to lymphoedema in left arm.  Frances Holland would like to know if Dr.Gupta thinks it is ok to get vaccines in such a close proximity or if  she should wait for one of them.

## 2020-08-11 NOTE — Telephone Encounter (Signed)
Yes that should be fine to take it in same arm 10 days apart

## 2020-08-15 ENCOUNTER — Ambulatory Visit: Payer: PPO | Admitting: Obstetrics and Gynecology

## 2020-08-15 ENCOUNTER — Other Ambulatory Visit: Payer: Self-pay | Admitting: Nurse Practitioner

## 2020-08-15 ENCOUNTER — Other Ambulatory Visit: Payer: Self-pay

## 2020-08-15 ENCOUNTER — Encounter: Payer: Self-pay | Admitting: Obstetrics and Gynecology

## 2020-08-15 VITALS — BP 130/80

## 2020-08-15 DIAGNOSIS — Z4689 Encounter for fitting and adjustment of other specified devices: Secondary | ICD-10-CM

## 2020-08-15 DIAGNOSIS — N813 Complete uterovaginal prolapse: Secondary | ICD-10-CM

## 2020-08-15 NOTE — Progress Notes (Signed)
Frances Holland March 15, 1929 [id number]  SUBJECTIVE:  86 y.o. G3P3 female presents for pessary placement.  She was initially seen about a week ago on 08/07/2020 and had complete pelvic procidentia and we discussed recommendation for a pessary at that time but she decided against it.  We discussed the possibility for urinary retention with her advanced stage of prolapse which is an emergent issue.  A few days later 08/10/2020 she ended up having gone to the emergency room due to some hematuria and vaginal bleeding.  Urinalysis was performed which indicated gross hematuria and not much just to be derived from the findings.  Gynecology follow-up was recommended.  She presents today with her husband.  Current Outpatient Medications  Medication Sig Dispense Refill  . acetaminophen (TYLENOL) 500 MG tablet Take 1,000 mg by mouth at bedtime.    Marland Kitchen amLODipine (NORVASC) 2.5 MG tablet TAKE 1 TABLET BY MOUTH ONCE DAILY TO LOWER BLOOD PRESSURE (Patient taking differently: Take 2.5 mg by mouth daily. Take with 5 mg tablet=7.5 mg) 90 tablet 1  . amLODipine (NORVASC) 5 MG tablet Take 1 tablet (5 mg total) by mouth daily. Take with the 2.5mg  for a total of 7.5mg  90 tablet 1  . Apoaequorin (PREVAGEN PO) Take 1 tablet by mouth daily. Regular strength 1 after breakfast     . aspirin EC 81 MG tablet Take 81 mg by mouth daily.    . Calcium Carb-Cholecalciferol (CALCIUM 600-D PO) Take 1 tablet by mouth daily.    . Cholecalciferol 5000 units capsule Take 5,000 Units by mouth daily.    Marland Kitchen levothyroxine (SYNTHROID) 112 MCG tablet Take 1 tablet (112 mcg total) by mouth daily before breakfast. 90 tablet 1  . losartan-hydrochlorothiazide (HYZAAR) 100-12.5 MG tablet TAKE 1 TABLET BY MOUTH ONCE DAILY IN THE MORNING FOR BLOOD PRESSURE 90 tablet 0  . MELATONIN PO Take 4 mg by mouth at bedtime.     . meloxicam (MOBIC) 7.5 MG tablet TAKE 1 TABLET BY MOUTH ONCE DAILY AS NEEDED FOR UP TO 15 DAYS FOR PAIN. TAKE WITH FOOD AS NEEDED FOR MODERATE  PAIN (Patient taking differently: Take 7.5 mg by mouth daily as needed for pain. ) 15 tablet 0  . mirabegron ER (MYRBETRIQ) 25 MG TB24 tablet Take 1 tablet (25 mg total) by mouth daily. 30 tablet 2  . Multiple Vitamins-Minerals (CENTRUM SILVER PO) Take 1 tablet by mouth daily.     . Omega-3 Fatty Acids (OMEGA-3 CF PO) Take 520 mg by mouth daily.     Vladimir Faster Glycol-Propyl Glycol (SYSTANE) 0.4-0.3 % SOLN Place 2 drops into both eyes daily as needed (dry eyes).     . trolamine salicylate (ASPERCREME) 10 % cream Apply 1 application topically as needed for muscle pain.     No current facility-administered medications for this visit.   Allergies: Donepezil hcl, Namenda [memantine hcl], Sulfa antibiotics, and Sulfamethoxazole  No LMP recorded. Patient has had a hysterectomy.  Past medical history,surgical history, problem list, medications, allergies, family history and social history were all reviewed and documented as reviewed in the EPIC chart.  ROS: Review of systems negative and pertinent positives are as outlined in the HPI.  OBJECTIVE:  BP 130/80 (BP Location: Right Arm, Patient Position: Sitting, Cuff Size: Normal)  The patient appears well, alert, oriented x 3, in no distress. PELVIC EXAM: PELVIC EXAM: Upon inspection of the perineum and vulva dorsal supine position, there is noted to be complete procidentia involving a cystocele and enterocele/vaginal cuff prolapse with no  appreciable rectocele.  The prolapse is easily reducible.  A size 2-1/4 Gellhorn pessary was inserted without difficulty.  The patient was allowed to get up and ambulate a little around the exam room and she returned to the dorsal supine position for exam and the pessary was noted to have remained in place but it felt loose with ability to fit a finger breadth or more between the vaginal sidewalls and the pessary.  The pessary was removed without difficulty. A size 2-1/2 Gellhorn pessary was inserted without  difficulty.  Following same sequence, the patient was allowed to get up and then return to the exam table.  She complained of no discomfort, pressure, vaginal pain and felt the prolapse was held up well in the vagina.  Reexamination indicated that the pessary was a good fit and maintained its position better than the smaller one.  She again tolerated the removal very well.  Chaperone: KimAlexis Bonham present during the examination  ASSESSMENT:  86 y.o. G3P3 here for pessary fitting for complete pelvic procidentia  PLAN:  We will order a size 2-1/2 Gellhorn pessary and have her return when we have the device for her.  We discussed maintenance of the pessary with frequent office visits every 3 to 4 months, potential drawbacks and side effects including vaginal bleeding, inadvertent kinking of the bladder and problems with emptying bladder secondary to urinary retention which can damage to bladder/GU system, and the expectation for vaginal discharge and different odor due to the presence of a foreign body in the vagina.  The patient and husband acknowledge this information and the patient will return for placement of her own personal pessary when it arrives.  All questions were answered by the end of the visit.   [name] MD 08/15/20

## 2020-08-16 ENCOUNTER — Telehealth: Payer: Self-pay | Admitting: *Deleted

## 2020-08-16 NOTE — Telephone Encounter (Signed)
[name], Husband called and stated that he just wanted to let you know that patient has agreed to the Pessary.  Stated that she has been fitted and it has been ordered. Once received they will insert.  Stated that they just wanted to let you know. FYI

## 2020-08-21 ENCOUNTER — Encounter: Payer: Self-pay | Admitting: Gynecology

## 2020-08-30 ENCOUNTER — Encounter: Payer: Self-pay | Admitting: Obstetrics and Gynecology

## 2020-08-30 ENCOUNTER — Other Ambulatory Visit: Payer: Self-pay

## 2020-08-30 ENCOUNTER — Ambulatory Visit: Payer: PPO | Admitting: Obstetrics and Gynecology

## 2020-08-30 VITALS — BP 124/80

## 2020-08-30 DIAGNOSIS — N8111 Cystocele, midline: Secondary | ICD-10-CM

## 2020-08-30 DIAGNOSIS — N819 Female genital prolapse, unspecified: Secondary | ICD-10-CM

## 2020-08-30 DIAGNOSIS — Z4689 Encounter for fitting and adjustment of other specified devices: Secondary | ICD-10-CM

## 2020-08-30 NOTE — Progress Notes (Signed)
   Jon Kasparek [birth year] [id number]  SUBJECTIVE:  86 y.o. G3P3 female with complete pelvic procidentia who presents today for a new pessary insertion as we fit her for a size 2-1/2 Gellhorn pessary after the last examination.   Past medical history,surgical history, problem list, medications, allergies, family history and social historywere all reviewed and documented as reviewed in the EPIC chart.  Physical Exam  BP 120/70  General: Pleasant female, no acute distress, alert and oriented PELVIC EXAM: External vulva BUS, vaginal cavity with atrophic changes.   Complete pelvic organ procidentia present.  The Gellhorn pessary (2-1/2" size) is placed without difficulty.  Aurora Mask (DNP student) present during the examination.  Assessment 86 year old G3 P3 with complete procidentia who presents for pessary placement  Plan Follow-up with [place] in 3 to 4 months for pessary maintenance.  Prior to this, if she is experiencing any pelvic pain, uncomfortable pressure, inability to empty her bladder, bleeding, or other problems, she should notify us and be evaluated.  [name] MD 08/30/20

## 2020-09-02 ENCOUNTER — Telehealth: Payer: Self-pay | Admitting: Obstetrics and Gynecology

## 2020-09-02 NOTE — Telephone Encounter (Signed)
The patient's husband called yesterday evening stating that his wife's pessary had fallen out and her vagina was out of her body. On review of her records she just had a pessary placed earlier this week for procidentia. She has been previously reducing her prolapse and voiding okay. Her husband stated she was voiding without difficulty. I advised him to call the office on Monday to get her in for a new pessary fitting. I discussed that if she was unable to void well she would need to go to the ER.   I called back today to check on Frances Holland. Her husband states that she is doing fine, voiding well. They will F/U with Dr Delilah Shan on Monday.

## 2020-09-04 ENCOUNTER — Other Ambulatory Visit: Payer: Self-pay

## 2020-09-04 ENCOUNTER — Encounter: Payer: Self-pay | Admitting: Obstetrics and Gynecology

## 2020-09-04 ENCOUNTER — Ambulatory Visit: Payer: PPO | Admitting: Obstetrics and Gynecology

## 2020-09-04 ENCOUNTER — Encounter: Payer: Self-pay | Admitting: Gynecology

## 2020-09-04 VITALS — BP 124/80

## 2020-09-04 DIAGNOSIS — N813 Complete uterovaginal prolapse: Secondary | ICD-10-CM | POA: Diagnosis not present

## 2020-09-04 DIAGNOSIS — Z4689 Encounter for fitting and adjustment of other specified devices: Secondary | ICD-10-CM

## 2020-09-04 NOTE — Telephone Encounter (Signed)
Thanks, Sharee Pimple. She is on the schedule for today.

## 2020-09-04 NOTE — Progress Notes (Signed)
   Frances Holland [birth year] [id number]  SUBJECTIVE:  86 y.o. G3P3 female with complete pelvic procidentia who presents today for a new pessary fitting as over the weekend her size 2-1/2 Gellhorn pessary did fall out with the bowel movement.  She otherwise felt improvement in her pelvic organ prolapse symptoms until the pessary was expelled.  Past medical history,surgical history, problem list, medications, allergies, family history and social historywere all reviewed and documented as reviewed in the EPIC chart.  Physical Exam BP 124/80    General: Pleasant female, no acute distress, alert and oriented PELVIC EXAM: External vulva BUS, vaginal cavity with atrophic changes.   No erosions on cervix or vaginal tissues.  Complete pelvic organ procidentia present.  A Gellhorn pessary (3" size) is placed without difficulty. The patient is allowed to get up and walk around the room and return to the sitting position.  Reexamination indicates the pessary is still in a normal location and there is no palpable excessive pressure on any of the surrounding vaginal mucosa.  The pessary was removed without difficulty.  [name] present during the examination.  Assessment 86 year old G3 P3 with complete procidentia who presents for pessary fitting  Plan We will order the size 3 inch Gellhorn pessary and have her return to place this when it arrives.   [name] MD 09/04/20

## 2020-09-06 ENCOUNTER — Non-Acute Institutional Stay: Payer: PPO | Admitting: Internal Medicine

## 2020-09-06 ENCOUNTER — Other Ambulatory Visit: Payer: Self-pay

## 2020-09-06 ENCOUNTER — Encounter: Payer: Self-pay | Admitting: Internal Medicine

## 2020-09-06 VITALS — BP 132/70 | HR 75 | Temp 97.5°F | Ht 64.0 in | Wt 169.8 lb

## 2020-09-06 DIAGNOSIS — F0280 Dementia in other diseases classified elsewhere without behavioral disturbance: Secondary | ICD-10-CM | POA: Diagnosis not present

## 2020-09-06 DIAGNOSIS — N3946 Mixed incontinence: Secondary | ICD-10-CM | POA: Diagnosis not present

## 2020-09-06 DIAGNOSIS — Z66 Do not resuscitate: Secondary | ICD-10-CM

## 2020-09-06 DIAGNOSIS — F411 Generalized anxiety disorder: Secondary | ICD-10-CM

## 2020-09-06 DIAGNOSIS — E039 Hypothyroidism, unspecified: Secondary | ICD-10-CM

## 2020-09-06 DIAGNOSIS — E785 Hyperlipidemia, unspecified: Secondary | ICD-10-CM

## 2020-09-06 DIAGNOSIS — G301 Alzheimer's disease with late onset: Secondary | ICD-10-CM | POA: Diagnosis not present

## 2020-09-06 DIAGNOSIS — E871 Hypo-osmolality and hyponatremia: Secondary | ICD-10-CM | POA: Diagnosis not present

## 2020-09-06 DIAGNOSIS — I10 Essential (primary) hypertension: Secondary | ICD-10-CM

## 2020-09-06 MED ORDER — MIRABEGRON ER 25 MG PO TB24
25.0000 mg | ORAL_TABLET | Freq: Every day | ORAL | 1 refills | Status: DC
Start: 2020-09-06 — End: 2020-11-13

## 2020-09-06 NOTE — Progress Notes (Signed)
Location:  [place] of Service:  Clinic (12)  Provider:   Code Status: DNR Goals of Care:  Advanced Directives 08/10/2020  Does Patient Have a Medical Advance Directive? No  Type of Advance Directive -  Does patient want to make changes to medical advance directive? -  Copy of [place] in Chart? -  Pre-existing out of facility DNR order (yellow form or pink MOST form) -     Chief Complaint  Patient presents with  . Acute Visit    Patient returns to the clinic for DNR and MOST form discussion.    HPI: Patient is a 86 y.o. female seen today for medical management of chronic diseases.    Patient has h/o Hypertension, Hypothyroidism, Insomnia and Cognitive impairment. Also Has h/o Skin Cancer and Thyroid Cancer Patient lives with her husband in [place] and is independent in her ADLs. Walks with her walker.  Urinary incontinence mixed Patient is doing better with myrbetriq Also was seen by gynecology for her cystocele.  Has agreed to do pessary is working with them to get the right size Dementia Doing well staying stable.  Stays independent in her ADLs.  Husband helps with other stuff Right hip pain and low back pain Takes Tylenol as needed No new symptoms Depression  And recent loss of her daughter.  But managing well Past Medical History:  Diagnosis Date  . Back pain   . Cancer [facility])    left breaset mastectomy   . Candidiasis of skin and nails   . Cellulitis and abscess of upper arm and forearm   . Heartburn   . Hemorrhoids 08/20/2016  . History of breast cancer 2000   History left mastectomy  . 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 Hypertension   . Insomnia   . Knee pain, bilateral 2014  . Lymphedema    Left arm following mastectomy  . Memory impairment   . Memory loss   . Migraine 05/16/2012  . Migraine without aura, without mention of intractable migraine without mention of status migrainosus    patient denies at  preop of 08/08/16   . Other and unspecified hyperlipidemia   . Other bursitis disorders   . Other infective bursitis, left shoulder   . Pain in joint, ankle and foot   . Pain in joint, hand   . Pain in joint, lower leg   . Pain in joint, shoulder region   . Pain in joint, shoulder region 04/23/2016  . Pain in limb   . Rectal bleeding 08/20/2016  . Senile osteoporosis   . Symptomatic menopausal or female climacteric states   . Syncope and collapse   . Unspecified hypertensive heart disease without heart failure 1995  . Unspecified hypothyroidism   . Unspecified polyarthropathy or polyarthritis, site unspecified   . Unspecified urinary incontinence   . Unspecified visual loss   . Xerophthalmia     Past Surgical History:  Procedure Laterality Date  . ABDOMINAL HYSTERECTOMY    . BILATERAL OOPHORECTOMY  1981  . BREAST RECONSTRUCTION Left 01/2000  . BREAST REDUCTION SURGERY Right 01/2000  . CATARACT EXTRACTION Right 09/04/2005  . COLONOSCOPY  01/2004  . MASTECTOMY Left 10/1999  . THYROID LOBECTOMY Left 08/14/2016   Procedure: NEAR TOTAL THYROIDECTOMY;  Surgeon: Johnathan Hausen, MD;  Location: [facility];  Service: General;  Laterality: Left;  . TOTAL ABDOMINAL HYSTERECTOMY W/ BILATERAL SALPINGOOPHORECTOMY  1981  . TOTAL KNEE ARTHROPLASTY Right 2014  Allergies  Allergen Reactions  . Donepezil Hcl     Body pain and stiffness   . Namenda [Memantine Hcl]      Caused body pain   . Sulfa Antibiotics   . Sulfamethoxazole Rash    Outpatient Encounter Medications as of 09/06/2020  Medication Sig  . acetaminophen (TYLENOL) 500 MG tablet Take 1,000 mg by mouth at bedtime.  Marland Kitchen amLODipine (NORVASC) 2.5 MG tablet TAKE 1 TABLET BY MOUTH ONCE DAILY TO LOWER BLOOD PRESSURE (Patient taking differently: Take 2.5 mg by mouth daily. Take with 5 mg tablet=7.5 mg)  . amLODipine (NORVASC) 5 MG tablet Take 1 tablet (5 mg total) by mouth daily. Take with the 2.5mg  for a total of 7.5mg   . Apoaequorin (PREVAGEN  PO) Take 1 tablet by mouth daily. Regular strength 1 after breakfast   . aspirin EC 81 MG tablet Take 81 mg by mouth daily.  . Calcium Carb-Cholecalciferol (CALCIUM 600-D PO) Take 1 tablet by mouth daily.  . Cholecalciferol 5000 units capsule Take 5,000 Units by mouth daily.  Marland Kitchen levothyroxine (SYNTHROID) 112 MCG tablet Take 1 tablet (112 mcg total) by mouth daily before breakfast.  . losartan-hydrochlorothiazide (HYZAAR) 100-12.5 MG tablet TAKE 1 TABLET BY MOUTH ONCE DAILY IN THE MORNING FOR BLOOD PRESSURE  . MELATONIN PO Take 4 mg by mouth at bedtime.   . meloxicam (MOBIC) 7.5 MG tablet TAKE 1 TABLET BY MOUTH ONCE DAILY AS NEEDED FOR UP TO 15 DAYS FOR PAIN. TAKE WITH FOOD AS NEEDED FOR MODERATE PAIN (Patient taking differently: Take 7.5 mg by mouth daily as needed for pain. )  . mirabegron ER (MYRBETRIQ) 25 MG TB24 tablet Take 1 tablet (25 mg total) by mouth daily.  . Multiple Vitamins-Minerals (CENTRUM SILVER PO) Take 1 tablet by mouth daily.   . Omega-3 Fatty Acids (OMEGA-3 CF PO) Take 520 mg by mouth daily.   Vladimir Faster Glycol-Propyl Glycol (SYSTANE) 0.4-0.3 % SOLN Place 2 drops into both eyes daily as needed (dry eyes).   . trolamine salicylate (ASPERCREME) 10 % cream Apply 1 application topically as needed for muscle pain.  . [DISCONTINUED] mirabegron ER (MYRBETRIQ) 25 MG TB24 tablet Take 1 tablet (25 mg total) by mouth daily.   No facility-administered encounter medications on file as of 09/06/2020.    Review of Systems:  Review of Systems  Review of Systems  Constitutional: Negative for activity change, appetite change, chills, diaphoresis, fatigue and fever.  HENT: Negative for mouth sores, postnasal drip, rhinorrhea, sinus pain and sore throat.   Respiratory: Negative for apnea, cough, chest tightness, shortness of breath and wheezing.   Cardiovascular: Negative for chest pain, palpitations and leg swelling.  Gastrointestinal: Negative for abdominal distention, abdominal pain,  constipation, diarrhea, nausea and vomiting.  Genitourinary: Negative for dysuria and frequency.  Musculoskeletal: Negative for arthralgias, joint swelling and myalgias.  Skin: Negative for rash.  Neurological: Negative for dizziness, syncope, weakness, light-headedness and numbness.  Psychiatric/Behavioral: Negative for behavioral problems, confusion and sleep disturbance.     Health Maintenance  Topic Date Due  . INFLUENZA VACCINE  07/09/2020  . TETANUS/TDAP  10/30/2027  . DEXA SCAN  Completed  . COVID-19 Vaccine  Completed  . PNA vac Low Risk Adult  Completed    Physical Exam: Vitals:   09/06/20 1407  BP: 132/70  Pulse: 75  Temp: (!) 97.5 F (36.4 C)  SpO2: 97%  Weight: 169 lb 12.8 oz (77 kg)  Height: 5\' 4"  (1.626 m)   Body mass index is 29.15  kg/m. Physical Exam  Constitutional: . Well-developed and well-nourished.  HENT:  Head: Normocephalic.  Mouth/Throat: Oropharynx is clear and moist.  Ears TM normal in both ears Eyes: Pupils are equal, round, and reactive to light.  Neck: Neck supple.  Cardiovascular: Normal rate and normal heart sounds.  No murmur heard. Pulmonary/Chest: Effort normal and breath sounds normal. No respiratory distress. No wheezes. She has no rales.  Abdominal: Soft. Bowel sounds are normal. No distension. There is no tenderness. There is no rebound.  Musculoskeletal:  Mild edema Bilateral Left More then Right  Lymphadenopathy: none Neurological: Little proximal Muscle weakness Does well with Walker  Skin: Skin is warm and dry.  Psychiatric: Normal mood and affect. Behavior is normal. Thought content normal.    Labs reviewed: Basic Metabolic Panel: Recent Labs    12/31/19 0901 03/30/20 0828 07/26/20 0852 08/10/20 1430  NA 131*  --  134* 131*  K 4.7  --  4.6 4.2  CL 96*  --  100 96*  CO2 26  --  24 24  GLUCOSE 92  --  87 103*  BUN 18  --  17 18  CREATININE 0.71  --  0.70 0.67  CALCIUM 9.4  --  9.6 9.7  TSH 8.52* 0.84 0.61  --     Liver Function Tests: Recent Labs    09/30/19 0000 12/31/19 0901 07/26/20 0852  AST 18 17 16   ALT 15 17 15   BILITOT 0.5 0.4 0.5  PROT 7.0 6.7 6.9   No results for input(s): LIPASE, AMYLASE in the last 8760 hours. No results for input(s): AMMONIA in the last 8760 hours. CBC: Recent Labs    12/31/19 0901 07/26/20 0852 08/10/20 1430  WBC 6.0 5.0 11.7*  NEUTROABS 3,306 2,425 9.4*  HGB 11.8 13.4 13.7  HCT 35.7 40.0 39.5  MCV 90.2 91.3 89.8  PLT 448* 345 311   Lipid Panel: Recent Labs    09/30/19 0000 07/26/20 0852  CHOL 209* 190  HDL 61 56  LDLCALC 125* 109*  TRIG 121 139  CHOLHDL 3.4 3.4   No results found for: HGBA1C  Procedures since last visit: No results found.  Assessment/Plan Essential hypertension Doing well on Hyzaar and Norvasc Mixed stress and urge urinary incontinence Continue Myrbetriq and pessary placement by gynecology Late onset Alzheimer's 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 ([place]) Sees neurology too Has not tolerated any medications takes prevagen Last MMSE was 24/30  Hyperlipidemia, unspecified hyperlipidemia type Did not tolerate statins LDL is in good levels Generalized anxiety disorder No meds right now Hyponatremia Sodium low but stable Hypothyroidism TSh Normal Levels  DNR (do not resuscitate) D/W the patient about DNR And MOST form filled with her opting for Tube feeding for limited time if required.   Labs/tests ordered:  * No order type specified * Next appt:  03/01/2021

## 2020-09-08 ENCOUNTER — Ambulatory Visit (INDEPENDENT_AMBULATORY_CARE_PROVIDER_SITE_OTHER): Payer: PPO | Admitting: Obstetrics and Gynecology

## 2020-09-08 ENCOUNTER — Other Ambulatory Visit: Payer: Self-pay

## 2020-09-08 ENCOUNTER — Encounter: Payer: Self-pay | Admitting: Obstetrics and Gynecology

## 2020-09-08 VITALS — BP 130/74

## 2020-09-08 DIAGNOSIS — Z4689 Encounter for fitting and adjustment of other specified devices: Secondary | ICD-10-CM

## 2020-09-08 DIAGNOSIS — N8111 Cystocele, midline: Secondary | ICD-10-CM

## 2020-09-08 DIAGNOSIS — N819 Female genital prolapse, unspecified: Secondary | ICD-10-CM | POA: Diagnosis not present

## 2020-09-08 NOTE — Progress Notes (Signed)
   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  Previously, she had presented following the weekend after a 2-1/2 inch Gellhorn pessary was inserted and fell during a a bowel movement.  Past medical history,surgical history, problem list, medications, allergies, family history and social historywere all reviewed and documented as reviewed in the EPIC chart.  Physical Exam  BP 130/74   General: Pleasant female, no acute distress, alert and oriented PELVIC EXAM: External vulva BUS, vaginal cavity with atrophic changes.   Complete pelvic organ procidentia present.  The Gellhorn pessary (3" size) is placed without difficulty.  [name] present during the examination.  Assessment 86 year old G3 P3 with complete procidentia who presents for pessary placement, pessary trial #2  Plan Follow-up with [place] in 3 to 4 months for pessary maintenance.  Prior to this, if she is experiencing any pelvic pain, uncomfortable pressure, inability to empty her bladder, bleeding, or other problems, she should notify us and be evaluated.   [name] MD 09/08/20

## 2020-09-11 ENCOUNTER — Telehealth: Payer: Self-pay

## 2020-09-11 ENCOUNTER — Ambulatory Visit (INDEPENDENT_AMBULATORY_CARE_PROVIDER_SITE_OTHER): Payer: PPO | Admitting: Obstetrics and Gynecology

## 2020-09-11 ENCOUNTER — Encounter: Payer: Self-pay | Admitting: Obstetrics and Gynecology

## 2020-09-11 ENCOUNTER — Other Ambulatory Visit: Payer: Self-pay

## 2020-09-11 VITALS — BP 120/78

## 2020-09-11 DIAGNOSIS — N8111 Cystocele, midline: Secondary | ICD-10-CM | POA: Diagnosis not present

## 2020-09-11 DIAGNOSIS — N819 Female genital prolapse, unspecified: Secondary | ICD-10-CM

## 2020-09-11 DIAGNOSIS — N393 Stress incontinence (female) (male): Secondary | ICD-10-CM | POA: Diagnosis not present

## 2020-09-11 DIAGNOSIS — T839XXA Unspecified complication of genitourinary prosthetic device, implant and graft, initial encounter: Secondary | ICD-10-CM

## 2020-09-11 NOTE — Telephone Encounter (Signed)
Husband called stating patient had new pessary inserted on Friday and by the time they got home was leaking fluid. Continue to leak a steady flow of urine all weekend. He stated they had a "major problem".  Rosemarie Ax will call him and schedule visit to recheck pessary.

## 2020-09-11 NOTE — Progress Notes (Signed)
   Frances Holland 1929/06/07 [id number]  SUBJECTIVE:  86 y.o. 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  The pessary has stayed in place; the smaller one she had previously did fall out.  She does not have any pelvic pain, discomfort, or discharge.  No vaginal bleeding.  No pain with urination.  She noticed the evening of the pessary placement that she started to experience a fairly steady stream of clear urine.  She does not routinely do Kegel exercises but is familiar with them from previous recommendations.  She does indicate that she feels much better with the pessary in place as her pelvic organ prolapse is not bothering her as much.   Allergies: Donepezil hcl, Namenda [memantine hcl], Sulfa antibiotics, and Sulfamethoxazole  No LMP recorded. Patient has had a hysterectomy.  Past medical history,surgical history, problem list, medications, allergies, family history and social history were all reviewed and documented as reviewed in the EPIC chart.  ROS: Pertinent positives and negatives as reviewed above.   OBJECTIVE:  BP 120/78  The patient appears well, alert, oriented, in no distress. PELVIC EXAM: Pessary appears to be in correct location   ASSESSMENT:  86 y.o. G3P3 here with occult stress urinary incontinence revealed by pessary use  PLAN:  I used some anatomic diagrams to review the anatomy of the pelvic area and the cause for the stress urinary incontinence now that her bladder has been lifted and in the process has unkinked the urethra.  I propose this as the mechanism for why she is having worsened stress urinary incontinence.  She also has a component of urge incontinence being treated with Myrbetriq by her primary doctor.  I recommend that she practice Kegel exercises regularly at least 3-4 times per day and try to perform regular timed voids to keep her bladder is empty as  possible.  If she is not seeing any relief from this and is continuing to have high levels of urinary leakage, I suggested that she seek evaluation with urology to provide further management recommendations for stress urinary incontinence.     [name] MD 09/11/20

## 2020-09-15 DIAGNOSIS — C50919 Malignant neoplasm of unspecified site of unspecified female breast: Secondary | ICD-10-CM | POA: Diagnosis not present

## 2020-09-15 DIAGNOSIS — E890 Postprocedural hypothyroidism: Secondary | ICD-10-CM | POA: Diagnosis not present

## 2020-09-15 DIAGNOSIS — I10 Essential (primary) hypertension: Secondary | ICD-10-CM | POA: Diagnosis not present

## 2020-09-15 DIAGNOSIS — F418 Other specified anxiety disorders: Secondary | ICD-10-CM | POA: Diagnosis not present

## 2020-09-15 DIAGNOSIS — E871 Hypo-osmolality and hyponatremia: Secondary | ICD-10-CM | POA: Diagnosis not present

## 2020-09-15 DIAGNOSIS — E785 Hyperlipidemia, unspecified: Secondary | ICD-10-CM | POA: Diagnosis not present

## 2020-09-15 DIAGNOSIS — M810 Age-related osteoporosis without current pathological fracture: Secondary | ICD-10-CM | POA: Diagnosis not present

## 2020-09-15 DIAGNOSIS — G3184 Mild cognitive impairment, so stated: Secondary | ICD-10-CM | POA: Diagnosis not present

## 2020-09-15 DIAGNOSIS — R296 Repeated falls: Secondary | ICD-10-CM | POA: Diagnosis not present

## 2020-09-25 ENCOUNTER — Other Ambulatory Visit: Payer: Self-pay | Admitting: Internal Medicine

## 2020-09-27 ENCOUNTER — Telehealth: Payer: Self-pay | Admitting: *Deleted

## 2020-09-27 NOTE — Telephone Encounter (Signed)
Patient husband, [name], called and stated that he has been reading in the newpaper that you shouldn't be taking Aspirin unless you have had a history of heart attack or stroke. Stated that patient is taking Aspirin and he wonders if she should be.  Please Advise.

## 2020-09-28 NOTE — Telephone Encounter (Signed)
Patient notified and agreed. Medication list updated.  

## 2020-09-28 NOTE — Telephone Encounter (Signed)
She can discontinue it.

## 2020-09-28 NOTE — Addendum Note (Signed)
Addended by: Rafael Bihari A on: 09/28/2020 03:52 PM   Modules accepted: Orders

## 2020-11-01 ENCOUNTER — Encounter: Payer: Self-pay | Admitting: Internal Medicine

## 2020-11-08 DIAGNOSIS — R351 Nocturia: Secondary | ICD-10-CM | POA: Diagnosis not present

## 2020-11-08 DIAGNOSIS — N3946 Mixed incontinence: Secondary | ICD-10-CM | POA: Diagnosis not present

## 2020-11-08 DIAGNOSIS — R350 Frequency of micturition: Secondary | ICD-10-CM | POA: Diagnosis not present

## 2020-11-10 ENCOUNTER — Telehealth: Payer: Self-pay | Admitting: *Deleted

## 2020-11-10 NOTE — Telephone Encounter (Signed)
Patient husband, Frances Holland called stating that she is taking Myrbetriq for her bladder. On 10/1 had pessary placed by Dr. Melanee Spry and stated that if that didn't help she would need to see Urologist. [facility] on Wednesday and they stated that the Myrebetriq probably was not helping.  Patient is wanting to know your opinion about continuing or Stopping the medication.   Please Advise.

## 2020-11-13 NOTE — Telephone Encounter (Signed)
Patient husband notified and agreed.

## 2020-11-13 NOTE — Telephone Encounter (Signed)
They can stop it right now. Will see how her symptoms do after stopping.

## 2020-11-13 NOTE — Addendum Note (Signed)
Addended by: Rafael Bihari A on: 11/13/2020 10:55 AM   Modules accepted: Orders

## 2020-12-04 ENCOUNTER — Ambulatory Visit: Payer: PPO | Admitting: Obstetrics and Gynecology

## 2020-12-05 ENCOUNTER — Encounter: Payer: Self-pay | Admitting: Obstetrics and Gynecology

## 2020-12-05 ENCOUNTER — Other Ambulatory Visit: Payer: Self-pay

## 2020-12-05 ENCOUNTER — Ambulatory Visit: Payer: PPO | Admitting: Obstetrics and Gynecology

## 2020-12-05 VITALS — BP 126/80

## 2020-12-05 DIAGNOSIS — N8111 Cystocele, midline: Secondary | ICD-10-CM | POA: Diagnosis not present

## 2020-12-05 DIAGNOSIS — N819 Female genital prolapse, unspecified: Secondary | ICD-10-CM

## 2020-12-05 DIAGNOSIS — T839XXD Unspecified complication of genitourinary prosthetic device, implant and graft, subsequent encounter: Secondary | ICD-10-CM | POA: Diagnosis not present

## 2020-12-05 NOTE — Progress Notes (Signed)
Frances Holland April 07, 1929 [id number]  SUBJECTIVE:  86 y.o. G3P3 female presents for pessary maintenance.  She has a 3 inch Gellhorn pessary in place which was placed 09/11/2020.  She failed a trial of the 2-1/2 inch Gellhorn as it fell out during a bowel movement.  With the larger pessary in place she has not had any pain or vaginal discomfort, and the device has stayed in place, but she is noticing more urinary incontinence since having the pessary in place and does have to wear absorbent underwear for this reason.  Denies any vaginal bleeding or discharge.   Current Outpatient Medications  Medication Sig Dispense Refill  . acetaminophen (TYLENOL) 500 MG tablet Take 1,000 mg by mouth at bedtime.    Marland Kitchen amLODipine (NORVASC) 2.5 MG tablet TAKE 1 TABLET BY MOUTH ONCE DAILY TO LOWER BLOOD PRESSURE 90 tablet 1  . amLODipine (NORVASC) 5 MG tablet TAKE 1 TABLET BY MOUTH ONCE DAILY. TAKE WITH THE 2.5 MG FOR TOTAL OF 7.5MG  90 tablet 1  . Apoaequorin (PREVAGEN PO) Take 1 tablet by mouth daily. Regular strength 1 after breakfast    . Calcium Carb-Cholecalciferol (CALCIUM 600-D PO) Take 1 tablet by mouth daily.    . Cholecalciferol 5000 units capsule Take 5,000 Units by mouth daily.    Marland Kitchen levothyroxine (SYNTHROID) 112 MCG tablet Take 1 tablet (112 mcg total) by mouth daily before breakfast. 90 tablet 1  . losartan-hydrochlorothiazide (HYZAAR) 100-12.5 MG tablet TAKE 1 TABLET BY MOUTH ONCE DAILY IN THE MORNING FOR BLOOD PRESSURE 90 tablet 0  . MELATONIN PO Take 4 mg by mouth at bedtime.     . meloxicam (MOBIC) 7.5 MG tablet TAKE 1 TABLET BY MOUTH ONCE DAILY AS NEEDED FOR UP TO 15 DAYS FOR PAIN. TAKE WITH FOOD AS NEEDED FOR MODERATE PAIN (Patient taking differently: Take 7.5 mg by mouth daily as needed for pain.) 15 tablet 0  . Multiple Vitamins-Minerals (CENTRUM SILVER PO) Take 1 tablet by mouth daily.     . Omega-3 Fatty Acids (OMEGA-3 CF PO) Take 520 mg by mouth daily.     Bertram Gala Glycol-Propyl Glycol  0.4-0.3 % SOLN Place 2 drops into both eyes daily as needed (dry eyes).     . trolamine salicylate (ASPERCREME) 10 % cream Apply 1 application topically as needed for muscle pain.     No current facility-administered medications for this visit.   Allergies: Donepezil hcl, Namenda [memantine hcl], Sulfa antibiotics, and Sulfamethoxazole  No LMP recorded. Patient has had a hysterectomy.  Past medical history,surgical history, problem list, medications, allergies, family history and social history were all reviewed and documented as reviewed in the EPIC chart.  ROS: Pertinent positives and negatives as reviewed in HPI    OBJECTIVE:  BP 126/80 (BP Location: Right Arm, Patient Position: Sitting, Cuff Size: Normal)  The patient appears well, alert, oriented x 3, in no distress.  PELVIC EXAM: After pessary removal, VULVA: normal appearing vulva with atrophic change, no masses, tenderness or lesions, VAGINA: normal appearing vagina with atrophic change, normal color and discharge, in line of erosion over the epithelium overlying the cystocele, CERVIX: Surgically absent  Pessary removal procedure note The Gellhorn knob is grasped with a ring forceps and traction did not result in easy removal.  The pessary had to be disimpacted from the surrounding cystocele tissue which was creating suction along the pessary preventing it from being removed.  Once the seal was broken the pessary was not able to be removed by applying stronger  traction to the knob.  The patient understandably had discomfort with this procedure but overall did well.  The pessary was cleansed and then placed in the bag for the patient to take home, the pessary was not replaced today.  Chaperone: Bari Mantis Bonham present during the examination  ASSESSMENT:  86 y.o. G3P3 here for pessary maintenance  PLAN:  The pessary is not replaced today.  The pessary caused some minor vaginal erosions which need time to heal.  Also this pessary  might be slightly large for her as it was considerably difficult to remove, although it was not causing her any discomfort while she was wearing it.  I recommended monitoring her symptoms and seeing how much the cystocele and prolapse bothers her and if it is, then I would recommend referral to urogynecology for further management and trial of other pessaries with more fine-tuning to find the one that would best fit her.  She is in agreement with this plan.   Frances Majors MD 12/05/20

## 2020-12-06 ENCOUNTER — Ambulatory Visit: Payer: PPO | Admitting: Obstetrics and Gynecology

## 2020-12-14 DIAGNOSIS — N3946 Mixed incontinence: Secondary | ICD-10-CM | POA: Diagnosis not present

## 2020-12-21 DIAGNOSIS — R350 Frequency of micturition: Secondary | ICD-10-CM | POA: Diagnosis not present

## 2020-12-21 DIAGNOSIS — N3946 Mixed incontinence: Secondary | ICD-10-CM | POA: Diagnosis not present

## 2020-12-28 ENCOUNTER — Other Ambulatory Visit: Payer: Self-pay | Admitting: *Deleted

## 2020-12-28 MED ORDER — AMLODIPINE BESYLATE 2.5 MG PO TABS: ORAL_TABLET | ORAL | 1 refills | Status: DC

## 2020-12-28 NOTE — Telephone Encounter (Signed)
Pharmacy requested refill.  °Pended Rx and sent to Dr. Gupta for approval due to HIGH ALERT Warning.  °

## 2021-01-24 ENCOUNTER — Telehealth: Payer: Self-pay | Admitting: Internal Medicine

## 2021-01-24 NOTE — Telephone Encounter (Signed)
Called to confirm [facility] appt, husband cancelled and said he does not want his wife called again for this. KM

## 2021-01-25 ENCOUNTER — Encounter: Payer: PPO | Admitting: Nurse Practitioner

## 2021-01-25 DIAGNOSIS — R350 Frequency of micturition: Secondary | ICD-10-CM | POA: Diagnosis not present

## 2021-01-25 DIAGNOSIS — N3946 Mixed incontinence: Secondary | ICD-10-CM | POA: Diagnosis not present

## 2021-02-13 DIAGNOSIS — C73 Malignant neoplasm of thyroid gland: Secondary | ICD-10-CM | POA: Diagnosis not present

## 2021-02-13 DIAGNOSIS — E871 Hypo-osmolality and hyponatremia: Secondary | ICD-10-CM | POA: Diagnosis not present

## 2021-02-13 DIAGNOSIS — I10 Essential (primary) hypertension: Secondary | ICD-10-CM | POA: Diagnosis not present

## 2021-02-13 DIAGNOSIS — G3184 Mild cognitive impairment, so stated: Secondary | ICD-10-CM | POA: Diagnosis not present

## 2021-02-13 DIAGNOSIS — F418 Other specified anxiety disorders: Secondary | ICD-10-CM | POA: Diagnosis not present

## 2021-02-13 DIAGNOSIS — E785 Hyperlipidemia, unspecified: Secondary | ICD-10-CM | POA: Diagnosis not present

## 2021-02-13 DIAGNOSIS — Z853 Personal history of malignant neoplasm of breast: Secondary | ICD-10-CM | POA: Diagnosis not present

## 2021-02-13 DIAGNOSIS — M810 Age-related osteoporosis without current pathological fracture: Secondary | ICD-10-CM | POA: Diagnosis not present

## 2021-02-13 DIAGNOSIS — Z85850 Personal history of malignant neoplasm of thyroid: Secondary | ICD-10-CM | POA: Diagnosis not present

## 2021-02-13 DIAGNOSIS — E890 Postprocedural hypothyroidism: Secondary | ICD-10-CM | POA: Diagnosis not present

## 2021-02-13 DIAGNOSIS — R296 Repeated falls: Secondary | ICD-10-CM | POA: Diagnosis not present

## 2021-02-28 DIAGNOSIS — I10 Essential (primary) hypertension: Secondary | ICD-10-CM | POA: Diagnosis not present

## 2021-02-28 DIAGNOSIS — E039 Hypothyroidism, unspecified: Secondary | ICD-10-CM | POA: Diagnosis not present

## 2021-03-01 ENCOUNTER — Other Ambulatory Visit: Payer: Self-pay

## 2021-03-01 DIAGNOSIS — I10 Essential (primary) hypertension: Secondary | ICD-10-CM

## 2021-03-01 DIAGNOSIS — E039 Hypothyroidism, unspecified: Secondary | ICD-10-CM

## 2021-03-01 LAB — CBC WITH DIFFERENTIAL/PLATELET
Absolute Monocytes: 733 cells/uL (ref 200–950)
Basophils Absolute: 40 cells/uL (ref 0–200)
Basophils Relative: 0.6 %
Eosinophils Absolute: 178 cells/uL (ref 15–500)
Eosinophils Relative: 2.7 %
HCT: 39.1 % (ref 35.0–45.0)
Hemoglobin: 13 g/dL (ref 11.7–15.5)
Lymphs Abs: 1742 cells/uL (ref 850–3900)
MCH: 30.3 pg (ref 27.0–33.0)
MCHC: 33.2 g/dL (ref 32.0–36.0)
MCV: 91.1 fL (ref 80.0–100.0)
MPV: 10.4 fL (ref 7.5–12.5)
Monocytes Relative: 11.1 %
Neutro Abs: 3907 cells/uL (ref 1500–7800)
Neutrophils Relative %: 59.2 %
Platelets: 367 10*3/uL (ref 140–400)
RBC: 4.29 10*6/uL (ref 3.80–5.10)
RDW: 12.9 % (ref 11.0–15.0)
Total Lymphocyte: 26.4 %
WBC: 6.6 10*3/uL (ref 3.8–10.8)

## 2021-03-01 LAB — COMPLETE METABOLIC PANEL WITH GFR
AG Ratio: 1.4 (calc) (ref 1.0–2.5)
ALT: 12 U/L (ref 6–29)
AST: 15 U/L (ref 10–35)
Albumin: 3.9 g/dL (ref 3.6–5.1)
Alkaline phosphatase (APISO): 70 U/L (ref 37–153)
BUN: 15 mg/dL (ref 7–25)
CO2: 23 mmol/L (ref 20–32)
Calcium: 9.5 mg/dL (ref 8.6–10.4)
Chloride: 98 mmol/L (ref 98–110)
Creat: 0.7 mg/dL (ref 0.60–0.88)
GFR, Est African American: 89 mL/min/{1.73_m2} (ref >=60)
GFR, Est Non African American: 77 mL/min/{1.73_m2} (ref >=60)
Globulin: 2.7 g/dL (calc) (ref 1.9–3.7)
Glucose, Bld: 89 mg/dL (ref 65–99)
Potassium: 4.4 mmol/L (ref 3.5–5.3)
Sodium: 133 mmol/L — ABNORMAL LOW (ref 135–146)
Total Bilirubin: 0.3 mg/dL (ref 0.2–1.2)
Total Protein: 6.6 g/dL (ref 6.1–8.1)

## 2021-03-01 LAB — TSH: TSH: 2.3 mIU/L (ref 0.40–4.50)

## 2021-03-07 ENCOUNTER — Other Ambulatory Visit: Payer: Self-pay

## 2021-03-07 ENCOUNTER — Non-Acute Institutional Stay: Payer: PPO | Admitting: Internal Medicine

## 2021-03-07 ENCOUNTER — Encounter: Payer: Self-pay | Admitting: Internal Medicine

## 2021-03-07 VITALS — BP 134/72 | HR 84 | Temp 95.5°F | Ht 64.0 in | Wt 170.4 lb

## 2021-03-07 DIAGNOSIS — N3946 Mixed incontinence: Secondary | ICD-10-CM

## 2021-03-07 DIAGNOSIS — M8580 Other specified disorders of bone density and structure, unspecified site: Secondary | ICD-10-CM

## 2021-03-07 DIAGNOSIS — F0280 Dementia in other diseases classified elsewhere without behavioral disturbance: Secondary | ICD-10-CM

## 2021-03-07 DIAGNOSIS — E039 Hypothyroidism, unspecified: Secondary | ICD-10-CM | POA: Diagnosis not present

## 2021-03-07 DIAGNOSIS — E785 Hyperlipidemia, unspecified: Secondary | ICD-10-CM | POA: Diagnosis not present

## 2021-03-07 DIAGNOSIS — R2681 Unsteadiness on feet: Secondary | ICD-10-CM | POA: Diagnosis not present

## 2021-03-07 DIAGNOSIS — I10 Essential (primary) hypertension: Secondary | ICD-10-CM | POA: Diagnosis not present

## 2021-03-07 DIAGNOSIS — G301 Alzheimer's disease with late onset: Secondary | ICD-10-CM

## 2021-03-08 NOTE — Progress Notes (Signed)
Location:  [place] of Service:  Clinic (12)  Provider:   Code Status: DNR Goals of Care:  Advanced Directives 08/10/2020  Does Patient Have a Medical Advance Directive? No  Type of Advance Directive -  Does patient want to make changes to medical advance directive? -  Copy of [place] in Chart? -  Pre-existing out of facility DNR order (yellow form or pink MOST form) -     Chief Complaint  Patient presents with  . Medical Management of Chronic Issues    Patient returns to the clinic for follow up.     HPI: Patient is a 87 y.o. female seen today for medical management of chronic diseases.    Patient has h/o Hypertension, Hypothyroidism, Insomnia and Cognitive impairment. Also Has h/o Skin Cancer and Thyroid Cancer Patient lives with her husband in [place] and is independent in her ADLs. Walks with her walker. Urinary incontinence mixed She is now taking oxybutynin.  Is seeing Dr. Wendy Poet from urology.  Has failed pessary.  Per husband symptoms are slightly better. Cognitive impairment Staying stable.  Staying independent in her ADLs.  Husband does help with her medications.  Patient has failed Namenda and Aricept.  She follows with neurology Unstable gait Husband has noticed that patient takes more time to get up and has become slow with her walker.  She has not had any recent falls.  Do not have a caregiver in their apartment They had lost their daughter last year.  The patient is doing better.  With no signs of depression  Past Medical History:  Diagnosis Date  . Back pain   . Cancer [facility])    left breaset mastectomy   . Candidiasis of skin and nails   . Cellulitis and abscess of upper arm and forearm   . Heartburn   . Hemorrhoids 08/20/2016  . History of breast cancer 2000   History left mastectomy  . 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 Hypertension   . Insomnia   . Knee pain, bilateral 2014  . Lymphedema    Left  arm following mastectomy  . Memory impairment   . Memory loss   . Migraine 05/16/2012  . Migraine without aura, without mention of intractable migraine without mention of status migrainosus    patient denies at preop of 08/08/16   . Other and unspecified hyperlipidemia   . Other bursitis disorders   . Other infective bursitis, left shoulder   . Pain in joint, ankle and foot   . Pain in joint, hand   . Pain in joint, lower leg   . Pain in joint, shoulder region   . Pain in joint, shoulder region 04/23/2016  . Pain in limb   . Rectal bleeding 08/20/2016  . Senile osteoporosis   . Symptomatic menopausal or female climacteric states   . Syncope and collapse   . Unspecified hypertensive heart disease without heart failure 1995  . Unspecified hypothyroidism   . Unspecified polyarthropathy or polyarthritis, site unspecified   . Unspecified urinary incontinence   . Unspecified visual loss   . Xerophthalmia     Past Surgical History:  Procedure Laterality Date  . ABDOMINAL HYSTERECTOMY    . BILATERAL OOPHORECTOMY  1981  . BREAST RECONSTRUCTION Left 01/2000  . BREAST REDUCTION SURGERY Right 01/2000  . CATARACT EXTRACTION Right 09/04/2005  . COLONOSCOPY  01/2004  . MASTECTOMY Left 10/1999  . THYROID LOBECTOMY Left 08/14/2016   Procedure: NEAR  TOTAL THYROIDECTOMY;  Surgeon: Johnathan Hausen, MD;  Location: [facility];  Service: General;  Laterality: Left;  . TOTAL ABDOMINAL HYSTERECTOMY W/ BILATERAL SALPINGOOPHORECTOMY  1981  . TOTAL KNEE ARTHROPLASTY Right 2014    Allergies  Allergen Reactions  . Donepezil Hcl     Body pain and stiffness   . Namenda [Memantine Hcl]      Caused body pain   . Sulfa Antibiotics   . Sulfamethoxazole Rash    Outpatient Encounter Medications as of 03/07/2021  Medication Sig  . amLODipine (NORVASC) 2.5 MG tablet Take one tablet by mouth once daily to lower blood pressure  . amLODipine (NORVASC) 5 MG tablet TAKE 1 TABLET BY MOUTH ONCE DAILY. TAKE WITH THE 2.5 MG  FOR TOTAL OF 7.5MG   . Apoaequorin (PREVAGEN PO) Take 1 tablet by mouth daily. Regular strength 1 after breakfast  . Calcium Carb-Cholecalciferol (CALCIUM 600-D PO) Take 1 tablet by mouth daily.  . Cholecalciferol 5000 units capsule Take 5,000 Units by mouth daily.  Marland Kitchen levothyroxine (SYNTHROID) 112 MCG tablet Take 1 tablet (112 mcg total) by mouth daily before breakfast.  . losartan-hydrochlorothiazide (HYZAAR) 100-12.5 MG tablet TAKE 1 TABLET BY MOUTH ONCE DAILY IN THE MORNING FOR BLOOD PRESSURE  . Multiple Vitamins-Minerals (CENTRUM SILVER PO) Take 1 tablet by mouth daily.   . Omega-3 Fatty Acids (OMEGA-3 CF PO) Take 520 mg by mouth daily.   Marland Kitchen oxybutynin (DITROPAN XL) 15 MG 24 hr tablet Take 15 mg by mouth daily.  Vladimir Faster Glycol-Propyl Glycol 0.4-0.3 % SOLN Place 2 drops into both eyes daily as needed (dry eyes).   . [DISCONTINUED] acetaminophen (TYLENOL) 500 MG tablet Take 1,000 mg by mouth at bedtime.  . [DISCONTINUED] MELATONIN PO Take 4 mg by mouth at bedtime.   . [DISCONTINUED] meloxicam (MOBIC) 7.5 MG tablet TAKE 1 TABLET BY MOUTH ONCE DAILY AS NEEDED FOR UP TO 15 DAYS FOR PAIN. TAKE WITH FOOD AS NEEDED FOR MODERATE PAIN (Patient taking differently: Take 7.5 mg by mouth daily as needed for pain.)  . [DISCONTINUED] trolamine salicylate (ASPERCREME) 10 % cream Apply 1 application topically as needed for muscle pain.   No facility-administered encounter medications on file as of 03/07/2021.    Review of Systems:  Review of Systems  Review of Systems  Constitutional: Negative for activity change, appetite change, chills, diaphoresis, fatigue and fever.  HENT: Negative for mouth sores, postnasal drip, rhinorrhea, sinus pain and sore throat.   Respiratory: Negative for apnea, cough, chest tightness, shortness of breath and wheezing.   Cardiovascular: Negative for chest pain, palpitations and leg swelling.  Gastrointestinal: Negative for abdominal distention, abdominal pain, constipation,  diarrhea, nausea and vomiting.  Genitourinary: Negative for dysuria and frequency.  Musculoskeletal: Negative for arthralgias, joint swelling and myalgias.  Skin: Negative for rash.  Neurological: Negative for dizziness, syncope, weakness, light-headedness and numbness.  Psychiatric/Behavioral: Negative for behavioral problems, confusion and sleep disturbance.     Health Maintenance  Topic Date Due  . COVID-19 Vaccine (3 - Moderna risk 4-dose series) 02/07/2020  . TETANUS/TDAP  10/30/2027  . INFLUENZA VACCINE  Completed  . DEXA SCAN  Completed  . PNA vac Low Risk Adult  Completed  . HPV VACCINES  Aged Out    Physical Exam: Vitals:   03/07/21 1303  BP: 134/72  Pulse: 84  Temp: (!) 95.5 F (35.3 C)  SpO2: 99%  Weight: 170 lb 6.4 oz (77.3 kg)  Height: 5\' 4"  (1.626 m)   Body mass index is 29.25 kg/m.  Physical Exam Constitutional:  Well-developed and well-nourished.  HENT:  Head: Normocephalic. Ears TM normal  Mouth/Throat: Oropharynx is clear and moist.  Eyes: Pupils are equal, round, and reactive to light.  Neck: Neck supple.  Cardiovascular: Normal rate and normal heart sounds.  No murmur heard. Pulmonary/Chest: Effort normal and breath sounds normal. No respiratory distress. No wheezes. She has no rales.  Abdominal: Soft. Bowel sounds are normal. No distension. There is no tenderness. There is no rebound.  Musculoskeletal: No edema.  Lymphadenopathy: none Neurological: Mild Proximal Muscle weakness Walks slowly with her walker Skin: Skin is warm and dry.  Psychiatric: Normal mood and affect. Behavior is normal. Thought content normal.   Labs reviewed: Basic Metabolic Panel: Recent Labs    03/30/20 0828 07/26/20 0852 08/10/20 1430 02/28/21 0849  NA  --  134* 131* 133*  K  --  4.6 4.2 4.4  CL  --  100 96* 98  CO2  --  24 24 23   GLUCOSE  --  87 103* 89  BUN  --  17 18 15   CREATININE  --  0.70 0.67 0.70  CALCIUM  --  9.6 9.7 9.5  TSH 0.84 0.61  --  2.30    Liver Function Tests: Recent Labs    07/26/20 0852 02/28/21 0849  AST 16 15  ALT 15 12  BILITOT 0.5 0.3  PROT 6.9 6.6   No results for input(s): LIPASE, AMYLASE in the last 8760 hours. No results for input(s): AMMONIA in the last 8760 hours. CBC: Recent Labs    07/26/20 0852 08/10/20 1430 02/28/21 0849  WBC 5.0 11.7* 6.6  NEUTROABS 2,425 9.4* 3,907  HGB 13.4 13.7 13.0  HCT 40.0 39.5 39.1  MCV 91.3 89.8 91.1  PLT 345 311 367   Lipid Panel: Recent Labs    07/26/20 0852  CHOL 190  HDL 56  LDLCALC 109*  TRIG 139  CHOLHDL 3.4   No results found for: HGBA1C  Procedures since last visit: No results found.  Assessment/Plan 1. Essential hypertension Stable on Norvasc and Hyzaar  - CBC with Differential/Platelet; Future - COMPLETE METABOLIC PANEL WITH GFR; Future  2. Mixed stress and urge urinary incontinence Working with Urology ON OXybutinin  3. Late onset Alzheimer's 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 ([place]) Has failed Aricept and Namenda Follows with Neurology  4. Hyperlipidemia, unspecified hyperlipidemia type Did not tolerate Statins  5. Hypothyroidism, unspecified type TSH normal Level  6. Unstable gait  - Ambulatory referral to Physical Therapy  7. Osteopenia, unspecified location  - DG Bone Density; Future  8.Hyponatremia Sodium low but stable  Labs/tests ordered:  * No order type specified * Next appt:  08/30/2021

## 2021-03-15 DIAGNOSIS — R2689 Other abnormalities of gait and mobility: Secondary | ICD-10-CM | POA: Diagnosis not present

## 2021-03-15 DIAGNOSIS — M6281 Muscle weakness (generalized): Secondary | ICD-10-CM | POA: Diagnosis not present

## 2021-03-15 DIAGNOSIS — R350 Frequency of micturition: Secondary | ICD-10-CM | POA: Diagnosis not present

## 2021-03-15 DIAGNOSIS — R2681 Unsteadiness on feet: Secondary | ICD-10-CM | POA: Diagnosis not present

## 2021-03-15 DIAGNOSIS — N3946 Mixed incontinence: Secondary | ICD-10-CM | POA: Diagnosis not present

## 2021-03-28 ENCOUNTER — Other Ambulatory Visit: Payer: Self-pay | Admitting: Internal Medicine

## 2021-03-28 DIAGNOSIS — M8580 Other specified disorders of bone density and structure, unspecified site: Secondary | ICD-10-CM

## 2021-03-29 ENCOUNTER — Other Ambulatory Visit: Payer: Self-pay | Admitting: Internal Medicine

## 2021-03-29 DIAGNOSIS — M8580 Other specified disorders of bone density and structure, unspecified site: Secondary | ICD-10-CM

## 2021-03-29 DIAGNOSIS — Z1231 Encounter for screening mammogram for malignant neoplasm of breast: Secondary | ICD-10-CM

## 2021-04-03 ENCOUNTER — Other Ambulatory Visit: Payer: Self-pay | Admitting: Internal Medicine

## 2021-04-10 DIAGNOSIS — M6281 Muscle weakness (generalized): Secondary | ICD-10-CM | POA: Diagnosis not present

## 2021-04-10 DIAGNOSIS — R2681 Unsteadiness on feet: Secondary | ICD-10-CM | POA: Diagnosis not present

## 2021-04-10 DIAGNOSIS — R2689 Other abnormalities of gait and mobility: Secondary | ICD-10-CM | POA: Diagnosis not present

## 2021-05-22 ENCOUNTER — Other Ambulatory Visit: Payer: Self-pay | Admitting: Internal Medicine

## 2021-05-24 ENCOUNTER — Other Ambulatory Visit: Payer: Self-pay | Admitting: Internal Medicine

## 2021-06-30 ENCOUNTER — Other Ambulatory Visit: Payer: Self-pay | Admitting: Nurse Practitioner

## 2021-08-09 ENCOUNTER — Telehealth: Payer: Self-pay | Admitting: *Deleted

## 2021-08-09 NOTE — Telephone Encounter (Signed)
Patient husband, [name], called and stated that he is reading the 36 Hour Day Book and it strongly recommends evaluation by Neurologist.  Patient husband is requesting a referral to be placed for a Neurologist.   Please Advise.

## 2021-08-09 NOTE — Telephone Encounter (Signed)
Husband notified and agreed.

## 2021-08-09 NOTE — Telephone Encounter (Signed)
She has seen Dr Jannifer Franklin before. She is still active patient with them. He can call their Office and make follow up appointment. She does not need new referral.

## 2021-08-14 NOTE — Telephone Encounter (Signed)
Patient husband called and stated that they called the Neurologist office and they stated that patient needs a referral because he is wanting to see Dr. Maureen Chatters.  Patient is wanting to see Dr. Maureen Chatters instead of Dr. Jannifer Franklin.  Would like a referral placed to be evaluated by Dr. Maureen Chatters.  Please Advise.

## 2021-08-15 ENCOUNTER — Other Ambulatory Visit: Payer: Self-pay | Admitting: Internal Medicine

## 2021-08-15 DIAGNOSIS — G301 Alzheimer's disease with late onset: Secondary | ICD-10-CM

## 2021-08-15 DIAGNOSIS — F0280 Dementia in other diseases classified elsewhere without behavioral disturbance: Secondary | ICD-10-CM

## 2021-08-15 NOTE — Telephone Encounter (Signed)
Virgie Dad, MD  You 6 minutes ago (9:47 AM)   Madaline Brilliant I will place the Referal  Thanks

## 2021-08-28 ENCOUNTER — Other Ambulatory Visit: Payer: Self-pay | Admitting: Internal Medicine

## 2021-08-29 ENCOUNTER — Other Ambulatory Visit: Payer: Self-pay | Admitting: Internal Medicine

## 2021-08-30 ENCOUNTER — Other Ambulatory Visit: Payer: Self-pay

## 2021-08-30 DIAGNOSIS — I10 Essential (primary) hypertension: Secondary | ICD-10-CM

## 2021-08-31 LAB — CBC WITH DIFFERENTIAL/PLATELET
Absolute Monocytes: 719 cells/uL (ref 200–950)
Basophils Absolute: 50 cells/uL (ref 0–200)
Basophils Relative: 0.8 %
Eosinophils Absolute: 211 cells/uL (ref 15–500)
Eosinophils Relative: 3.4 %
HCT: 39.1 % (ref 35.0–45.0)
Hemoglobin: 13.2 g/dL (ref 11.7–15.5)
Lymphs Abs: 1662 cells/uL (ref 850–3900)
MCH: 30.2 pg (ref 27.0–33.0)
MCHC: 33.8 g/dL (ref 32.0–36.0)
MCV: 89.5 fL (ref 80.0–100.0)
MPV: 10.1 fL (ref 7.5–12.5)
Monocytes Relative: 11.6 %
Neutro Abs: 3559 cells/uL (ref 1500–7800)
Neutrophils Relative %: 57.4 %
Platelets: 389 10*3/uL (ref 140–400)
RBC: 4.37 10*6/uL (ref 3.80–5.10)
RDW: 13.1 % (ref 11.0–15.0)
Total Lymphocyte: 26.8 %
WBC: 6.2 10*3/uL (ref 3.8–10.8)

## 2021-08-31 LAB — COMPLETE METABOLIC PANEL WITH GFR
AG Ratio: 1.4 (calc) (ref 1.0–2.5)
ALT: 14 U/L (ref 6–29)
AST: 16 U/L (ref 10–35)
Albumin: 4 g/dL (ref 3.6–5.1)
Alkaline phosphatase (APISO): 67 U/L (ref 37–153)
BUN: 14 mg/dL (ref 7–25)
CO2: 26 mmol/L (ref 20–32)
Calcium: 9.8 mg/dL (ref 8.6–10.4)
Chloride: 99 mmol/L (ref 98–110)
Creat: 0.69 mg/dL (ref 0.60–0.95)
Globulin: 2.8 g/dL (calc) (ref 1.9–3.7)
Glucose, Bld: 95 mg/dL (ref 65–99)
Potassium: 4.2 mmol/L (ref 3.5–5.3)
Sodium: 134 mmol/L — ABNORMAL LOW (ref 135–146)
Total Bilirubin: 0.4 mg/dL (ref 0.2–1.2)
Total Protein: 6.8 g/dL (ref 6.1–8.1)
eGFR: 82 mL/min/{1.73_m2} (ref >=60)

## 2021-09-05 ENCOUNTER — Other Ambulatory Visit: Payer: Self-pay

## 2021-09-05 ENCOUNTER — Non-Acute Institutional Stay: Payer: PPO | Admitting: Internal Medicine

## 2021-09-05 ENCOUNTER — Encounter: Payer: Self-pay | Admitting: Internal Medicine

## 2021-09-05 VITALS — BP 138/90 | HR 103 | Temp 97.4°F | Resp 20 | Wt 167.0 lb

## 2021-09-05 DIAGNOSIS — R2681 Unsteadiness on feet: Secondary | ICD-10-CM

## 2021-09-05 DIAGNOSIS — M545 Low back pain, unspecified: Secondary | ICD-10-CM

## 2021-09-05 DIAGNOSIS — E890 Postprocedural hypothyroidism: Secondary | ICD-10-CM

## 2021-09-05 DIAGNOSIS — F0280 Dementia in other diseases classified elsewhere without behavioral disturbance: Secondary | ICD-10-CM

## 2021-09-05 DIAGNOSIS — E039 Hypothyroidism, unspecified: Secondary | ICD-10-CM

## 2021-09-05 DIAGNOSIS — G301 Alzheimer's disease with late onset: Secondary | ICD-10-CM | POA: Diagnosis not present

## 2021-09-05 DIAGNOSIS — I10 Essential (primary) hypertension: Secondary | ICD-10-CM

## 2021-09-05 DIAGNOSIS — N3946 Mixed incontinence: Secondary | ICD-10-CM | POA: Diagnosis not present

## 2021-09-05 DIAGNOSIS — E785 Hyperlipidemia, unspecified: Secondary | ICD-10-CM | POA: Diagnosis not present

## 2021-09-05 DIAGNOSIS — F411 Generalized anxiety disorder: Secondary | ICD-10-CM | POA: Diagnosis not present

## 2021-09-05 DIAGNOSIS — E871 Hypo-osmolality and hyponatremia: Secondary | ICD-10-CM

## 2021-09-05 DIAGNOSIS — G8929 Other chronic pain: Secondary | ICD-10-CM

## 2021-09-05 DIAGNOSIS — M8580 Other specified disorders of bone density and structure, unspecified site: Secondary | ICD-10-CM | POA: Diagnosis not present

## 2021-09-05 NOTE — Progress Notes (Signed)
Location:  [place] of Service:  Clinic (12)  Provider:   Code Status: DNR Goals of Care:  Advanced Directives 09/05/2021  Does Patient Have a Medical Advance Directive? Yes  Type of Paramedic of [place];Living will  Does patient want to make changes to medical advance directive? No - Patient declined  Copy of [place] in Chart? Yes - validated most recent copy scanned in chart (See row information)  Pre-existing out of facility DNR order (yellow form or pink MOST form) Pink MOST form placed in chart (order not valid for inpatient use)     Chief Complaint  Patient presents with   Medical Management of Chronic Issues    Patient returns to the clinic for 6 month follow up.    Quality Metric Gaps    Flu shot    HPI: Patient is a 87 y.o. female seen today for medical management of chronic diseases.    Patient has h/o Hypertension, Hypothyroidism, Insomnia and Cognitive impairment. Also Has h/o Skin Cancer and Thyroid Cancer Patient lives with her husband in [place] and is independent in her ADLs.  Walks with her walker.  Urinary incontinence mixed She is now taking oxybutynin.  Is seeing Dr. Wendy Poet from urology.  Has failed pessary.     Cognitive impairment Staying stable.  Staying independent in her ADLs.  Husband does help with her medications.  Patient has failed Namenda and Aricept.   NO change per husband  [place] planning to move to [place] right now  Unstable gait Husband has noticed that patient takes more time to get up and has become slow with her walker.   Did get therapy but continues to be very sedentary  Insomnia Does not sleep before midnight but does get 6-7 hours of sleep No Snoring or Daytime Sleepiness or Fatigue  She has not had any recent falls.  Walks with her walker They had lost their daughter last year.  The patient is doing better.  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   Hypertension    Insomnia    Knee pain, bilateral 2014   Lymphedema    Left arm following mastectomy   Memory impairment    Memory loss    Migraine 05/16/2012   Migraine without aura, without mention of intractable migraine without mention of status migrainosus    patient denies at preop of 08/08/16    Other and unspecified hyperlipidemia    Other bursitis disorders    Other infective bursitis, left shoulder    Pain in joint, ankle and foot    Pain in joint, hand    Pain in joint, lower leg    Pain in joint, shoulder region    Pain in joint, shoulder region 04/23/2016   Pain in limb    Rectal bleeding 08/20/2016   Senile osteoporosis    Symptomatic menopausal or female climacteric states    Syncope and collapse    Unspecified hypertensive heart disease without heart failure 1995   Unspecified hypothyroidism    Unspecified polyarthropathy or polyarthritis,  site unspecified    Unspecified urinary incontinence    Unspecified visual loss    Xerophthalmia     Past Surgical History:  Procedure Laterality Date   ABDOMINAL HYSTERECTOMY     BILATERAL OOPHORECTOMY  1981   BREAST RECONSTRUCTION Left 01/2000   BREAST REDUCTION SURGERY Right 01/2000   CATARACT EXTRACTION Right 09/04/2005   COLONOSCOPY  01/2004   MASTECTOMY Left 10/1999   THYROID LOBECTOMY Left 08/14/2016   Procedure: NEAR TOTAL THYROIDECTOMY;  Surgeon: Johnathan Hausen, MD;  Location: [facility];  Service: General;  Laterality: Left;   TOTAL ABDOMINAL HYSTERECTOMY W/ BILATERAL SALPINGOOPHORECTOMY  1981   TOTAL KNEE ARTHROPLASTY Right 2014    Allergies  Allergen Reactions   Donepezil Hcl     Body pain and stiffness    Namenda  [Memantine Hcl]      Caused body pain    Sulfa Antibiotics    Sulfamethoxazole Rash    Outpatient Encounter Medications as of 09/05/2021  Medication Sig   amLODipine (NORVASC) 2.5 MG tablet Take one tablet by mouth once daily to lower blood pressure   amLODipine (NORVASC) 5 MG tablet TAKE 1 TABLET BY MOUTH ONCE DAILY. TAKE WITH THE 2.5 MG FOR TOTAL OF 7.5 MG   Apoaequorin (PREVAGEN PO) Take 1 tablet by mouth daily. Regular strength 1 after breakfast   Calcium Carb-Cholecalciferol (CALCIUM 600-D PO) Take 1 tablet by mouth daily.   Cholecalciferol 5000 units capsule Take 5,000 Units by mouth daily.   levothyroxine (SYNTHROID) 112 MCG tablet Take 1 tablet (112 mcg total) by mouth daily before breakfast.   losartan-hydrochlorothiazide (HYZAAR) 100-12.5 MG tablet Take 1 tablet by mouth once daily   Multiple Vitamins-Minerals (CENTRUM SILVER PO) Take 1 tablet by mouth daily.    Omega-3 Fatty Acids (OMEGA-3 CF PO) Take 520 mg by mouth daily.    oxybutynin (DITROPAN XL) 15 MG 24 hr tablet Take 15 mg by mouth daily.   Polyethyl Glycol-Propyl Glycol 0.4-0.3 % SOLN Place 2 drops into both eyes daily as needed (dry eyes).    No facility-administered encounter medications on file as of 09/05/2021.    Review of Systems:  Review of Systems Review of Systems  Constitutional: Negative for activity change, appetite change, chills, diaphoresis, fatigue and fever.  HENT: Negative for mouth sores, postnasal drip, rhinorrhea, sinus pain and sore throat.   Respiratory: Negative for apnea, cough, chest tightness, shortness of breath and wheezing.   Cardiovascular: Negative for chest pain, palpitations and leg swelling.  Gastrointestinal: Negative for abdominal distention, abdominal pain, constipation, diarrhea, nausea and vomiting.  Genitourinary: Negative for dysuria and frequency.  Musculoskeletal: Negative for arthralgias, joint swelling and myalgias.  Skin: Negative for rash.  Neurological: Negative for  dizziness, syncope, weakness, light-headedness and numbness.  Psychiatric/Behavioral: Negative for behavioral problems, confusion and sleep disturbance.    Health Maintenance  Topic Date Due   INFLUENZA VACCINE  07/09/2021   TETANUS/TDAP  10/30/2027   DEXA SCAN  Completed   COVID-19 Vaccine  Completed   Zoster Vaccines- Shingrix  Completed   HPV VACCINES  Aged Out    Physical Exam: Vitals:   09/05/21 1224  BP: 138/90  Pulse: (!) 103  Resp: 20  Temp: (!) 97.4 F (36.3 C)  Weight: 167 lb (75.8 kg)   Body mass index is 28.67 kg/m. Physical Exam Constitutional: Oriented to person, place, and time. Well-developed and well-nourished.  HENT:  Head: Normocephalic.  Mouth/Throat: Oropharynx is clear and moist.  Eyes: Pupils are equal, round, and reactive  to light.  Neck: Neck supple.  Cardiovascular: Normal rate and normal heart sounds.  No murmur heard. Pulmonary/Chest: Effort normal and breath sounds normal. No respiratory distress. No wheezes. She has no rales.  Abdominal: Soft. Bowel sounds are normal. No distension. There is no tenderness. There is no rebound.  Musculoskeletal: No edema.  Lymphadenopathy: none Neurological: Alert and oriented to person, place, and time.  Skin: Skin is warm and dry.  Psychiatric: Normal mood and affect. Behavior is normal. Thought content normal.   MMSE - Mini Mental State Exam 09/05/2021 04/11/2020 04/04/2020  Not completed: - - -  Orientation to time 3 3 5   Orientation to Place 4 4 3   Registration 3 3 3   Attention/ Calculation 1 3 4   Recall 3 2 0  Language- name 2 objects 2 2 2   Language- repeat 1 1 1   Language- follow 3 step command 3 3 3   Language- read & follow direction 1 1 1   Write a sentence 1 1 1   Copy design 1 1 1   Total score 23 24 24     Labs reviewed: Basic Metabolic Panel: Recent Labs    02/28/21 0849 08/30/21 0805  NA 133* 134*  K 4.4 4.2  CL 98 99  CO2 23 26  GLUCOSE 89 95  BUN 15 14  CREATININE 0.70 0.69   CALCIUM 9.5 9.8  TSH 2.30  --    Liver Function Tests: Recent Labs    02/28/21 0849 08/30/21 0805  AST 15 16  ALT 12 14  BILITOT 0.3 0.4  PROT 6.6 6.8   No results for input(s): LIPASE, AMYLASE in the last 8760 hours. No results for input(s): AMMONIA in the last 8760 hours. CBC: Recent Labs    02/28/21 0849 08/30/21 0805  WBC 6.6 6.2  NEUTROABS 3,907 3,559  HGB 13.0 13.2  HCT 39.1 39.1  MCV 91.1 89.5  PLT 367 389   Lipid Panel: No results for input(s): CHOL, HDL, LDLCALC, TRIG, CHOLHDL, LDLDIRECT in the last 8760 hours. No results found for: HGBA1C  Procedures since last visit: No results found.  Assessment/Plan Late onset Alzheimer's 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 ([place]) Passed her Clock Drawing MMSE has not changed in past year Off all her Meds Staying independent in her ADLS Husband main caregiver . No Behavior issues Has appointment with Neurology Essential hypertension BP stable on Norvasc and Hyzaar Mixed stress and urge urinary incontinence On Oxybutynin Failed Pessary Follow with Urology Hyperlipidemia,  Did not tolerate statins Hypothyroidism, unspecified type TSH normal in 3/22 Unstable gait Walks with her walker Osteopenia, unspecified location Repeat DEXA pending Generalized anxiety disorder Stable Chronic midline low back pain without sciatica Tylenol pRN  Hyponatremia Sodium is stable   Labs/tests ordered:  * No order type specified * Next appt:  Visit date not found

## 2021-09-14 DIAGNOSIS — R350 Frequency of micturition: Secondary | ICD-10-CM | POA: Diagnosis not present

## 2021-09-14 DIAGNOSIS — N3946 Mixed incontinence: Secondary | ICD-10-CM | POA: Diagnosis not present

## 2021-09-17 ENCOUNTER — Encounter: Payer: Self-pay | Admitting: Orthopedic Surgery

## 2021-09-17 ENCOUNTER — Ambulatory Visit (INDEPENDENT_AMBULATORY_CARE_PROVIDER_SITE_OTHER): Payer: PPO | Admitting: Orthopedic Surgery

## 2021-09-17 ENCOUNTER — Other Ambulatory Visit: Payer: Self-pay

## 2021-09-17 VITALS — BP 140/80 | HR 77 | Temp 97.5°F | Ht 64.0 in | Wt 164.6 lb

## 2021-09-17 DIAGNOSIS — N8110 Cystocele, unspecified: Secondary | ICD-10-CM | POA: Diagnosis not present

## 2021-09-17 DIAGNOSIS — K625 Hemorrhage of anus and rectum: Secondary | ICD-10-CM | POA: Diagnosis not present

## 2021-09-17 DIAGNOSIS — K649 Unspecified hemorrhoids: Secondary | ICD-10-CM | POA: Diagnosis not present

## 2021-09-17 LAB — CBC WITH DIFFERENTIAL/PLATELET
Absolute Monocytes: 1020 cells/uL — ABNORMAL HIGH (ref 200–950)
Basophils Absolute: 51 cells/uL (ref 0–200)
Basophils Relative: 0.5 %
Eosinophils Absolute: 122 cells/uL (ref 15–500)
Eosinophils Relative: 1.2 %
HCT: 37.9 % (ref 35.0–45.0)
Hemoglobin: 13 g/dL (ref 11.7–15.5)
Lymphs Abs: 1601 cells/uL (ref 850–3900)
MCH: 30.5 pg (ref 27.0–33.0)
MCHC: 34.3 g/dL (ref 32.0–36.0)
MCV: 89 fL (ref 80.0–100.0)
MPV: 10.2 fL (ref 7.5–12.5)
Monocytes Relative: 10 %
Neutro Abs: 7405 cells/uL (ref 1500–7800)
Neutrophils Relative %: 72.6 %
Platelets: 391 10*3/uL (ref 140–400)
RBC: 4.26 10*6/uL (ref 3.80–5.10)
RDW: 13 % (ref 11.0–15.0)
Total Lymphocyte: 15.7 %
WBC: 10.2 10*3/uL (ref 3.8–10.8)

## 2021-09-17 MED ORDER — SENNA-DOCUSATE SODIUM 8.6-50 MG PO TABS: 1.0000 | ORAL_TABLET | Freq: Every day | ORAL | 5 refills | Status: DC

## 2021-09-17 NOTE — Patient Instructions (Addendum)
Please start stool softener to prevent hard stools.   Please drink more water.   Referral to gynecology made. They will call you to schedule.

## 2021-09-17 NOTE — Progress Notes (Signed)
Careteam: Patient Care Team: Virgie Dad, MD as PCP - General (Internal Medicine) Reynold Bowen, MD as Consulting Physician (Endocrinology)  Seen by: Windell Moulding, AGNP-C  PLACE OF SERVICE:  [place]  Advanced Directive information    Allergies  Allergen Reactions   Donepezil Hcl     Body pain and stiffness    Namenda [Memantine Hcl]      Caused body pain    Sulfa Antibiotics    Sulfamethoxazole Rash    Chief Complaint  Patient presents with   Acute Visit    Patient complains of bright red blood. Patient has has bleeding since this morning, thins it is coming from vaginal area. No abdominal pain. His morning there bright red blood with some signs of clotting.     HPI: Patient is a 87 y.o. female seen today for acute visit due to rectal bleeding.   Husband present for encounter.   She complains of bright red blood on her depend brief this morning. No other incidents of rectal bleeding in past month. Denies rectal pain, pain with defecation or abdominal pain. She describes stool as  hard. Does not take stool softener or drink fluids well. History of internal and external hemorrhoids in the past. She is a poor historian due to dementia, husband helped with answering questions.   She was seen by gynecologist in the past for pessary. Not followed by gynecology at this time. Reports frequent urination, remains on oxybutynin.   Review of Systems:  Review of Systems  Constitutional:  Negative for chills, fever, malaise/fatigue and weight loss.  Respiratory:  Negative for cough, shortness of breath and wheezing.   Cardiovascular:  Negative for chest pain and leg swelling.  Gastrointestinal:  Positive for blood in stool and constipation. Negative for abdominal pain, diarrhea, heartburn, nausea and vomiting.       Hemorrhoids  Genitourinary:  Positive for frequency. Negative for dysuria and hematuria.  Psychiatric/Behavioral:  Positive for memory loss. Negative for  depression. The patient is not nervous/anxious.    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   Hypertension    Insomnia    Knee pain, bilateral 2014   Lymphedema    Left arm following mastectomy   Memory impairment    Memory loss    Migraine 05/16/2012   Migraine without aura, without mention of intractable migraine without mention of status migrainosus    patient denies at preop of 08/08/16    Other and unspecified hyperlipidemia    Other bursitis disorders    Other infective bursitis, left shoulder    Pain in joint, ankle and foot    Pain in joint, hand    Pain in joint, lower leg    Pain in joint, shoulder region    Pain in joint, shoulder region 04/23/2016   Pain in limb    Rectal bleeding 08/20/2016   Senile osteoporosis    Symptomatic menopausal or female climacteric states    Syncope and collapse    Unspecified hypertensive heart disease without heart failure 1995   Unspecified hypothyroidism    Unspecified polyarthropathy or polyarthritis, site unspecified  Unspecified urinary incontinence    Unspecified visual loss    Xerophthalmia    Past Surgical History:  Procedure Laterality Date   ABDOMINAL HYSTERECTOMY     BILATERAL OOPHORECTOMY  1981   BREAST RECONSTRUCTION Left 01/2000   BREAST REDUCTION SURGERY Right 01/2000   CATARACT EXTRACTION Right 09/04/2005   COLONOSCOPY  01/2004   MASTECTOMY Left 10/1999   THYROID LOBECTOMY Left 08/14/2016   Procedure: NEAR TOTAL THYROIDECTOMY;  Surgeon: Johnathan Hausen, MD;  Location: [facility];  Service: General;  Laterality: Left;   TOTAL ABDOMINAL HYSTERECTOMY W/ BILATERAL SALPINGOOPHORECTOMY  1981   TOTAL KNEE ARTHROPLASTY Right 2014   Social History:    reports that she has never smoked. She has never used smokeless tobacco. She reports that she does not drink alcohol and does not use drugs.  Family History  Problem Relation Age of Onset   Heart disease Mother    Dementia Mother    Dementia Father    Cancer Daughter    Asthma Son     Medications: Patient's Medications  New Prescriptions   No medications on file  Previous Medications   AMLODIPINE (NORVASC) 2.5 MG TABLET    Take one tablet by mouth once daily to lower blood pressure   AMLODIPINE (NORVASC) 5 MG TABLET    TAKE 1 TABLET BY MOUTH ONCE DAILY. TAKE WITH THE 2.5 MG FOR TOTAL OF 7.5 MG   APOAEQUORIN (PREVAGEN PO)    Take 1 tablet by mouth daily. Regular strength 1 after breakfast   CALCIUM CARB-CHOLECALCIFEROL (CALCIUM 600-D PO)    Take 1 tablet by mouth daily.   CHOLECALCIFEROL 5000 UNITS CAPSULE    Take 5,000 Units by mouth daily.   LEVOTHYROXINE (SYNTHROID) 112 MCG TABLET    Take 1 tablet (112 mcg total) by mouth daily before breakfast.   LOSARTAN-HYDROCHLOROTHIAZIDE (HYZAAR) 100-12.5 MG TABLET    Take 1 tablet by mouth once daily   MULTIPLE VITAMINS-MINERALS (CENTRUM SILVER PO)    Take 1 tablet by mouth daily.    OMEGA-3 FATTY ACIDS (OMEGA-3 CF PO)    Take 520 mg by mouth daily.    OXYBUTYNIN (DITROPAN XL) 15 MG 24 HR TABLET    Take 15 mg by mouth daily.   POLYETHYL GLYCOL-PROPYL GLYCOL 0.4-0.3 % SOLN    Place 2 drops into both eyes daily as needed (dry eyes).   Modified Medications   No medications on file  Discontinued Medications   No medications on file    Physical Exam:  Vitals:   09/17/21 1543  BP: 140/80  Pulse: 77  Temp: (!) 97.5 F (36.4 C)  SpO2: 97%  Weight: 164 lb 9.6 oz (74.7 kg)  Height: 5\' 4"  (1.626 m)   Body mass index is 28.25 kg/m. Wt Readings from Last 3 Encounters:  09/17/21 164 lb 9.6 oz (74.7 kg)  09/05/21 167 lb (75.8 kg)  03/07/21 170 lb 6.4 oz (77.3 kg)    Physical Exam Vitals reviewed. Exam conducted with a chaperone present.   Constitutional:      General: She is not in acute distress. HENT:     Head: Normocephalic.  Eyes:     General:        Right eye: No discharge.        Left eye: No discharge.  Cardiovascular:     Rate and Rhythm: Normal rate and regular rhythm.     Pulses: Normal pulses.     Heart sounds: Normal heart sounds. No murmur heard. Pulmonary:  Effort: Pulmonary effort is normal. No respiratory distress.     Breath sounds: Normal breath sounds. No wheezing.  Abdominal:     General: Bowel sounds are normal. There is no distension.     Palpations: Abdomen is soft.     Tenderness: There is no abdominal tenderness.  Genitourinary:    Comments: Rectal exam with multiple internal (2-3 palpated) and external (circling anus) hemorrhoids,vary in size, non tender to touch, dried blood present along anus. Large mass protruding from vagina, tissue red, no skin breakdown, non tender to touch.  Skin:    General: Skin is warm and dry.     Capillary Refill: Capillary refill takes less than 2 seconds.  Neurological:     General: No focal deficit present.     Mental Status: She is alert. Mental status is at baseline.     Motor: Weakness present.     Gait: Gait abnormal.     Comments: walker  Psychiatric:        Mood and Affect: Mood normal.        Behavior: Behavior normal.        Cognition and Memory: Memory is impaired.    Labs reviewed: Basic Metabolic Panel: Recent Labs    02/28/21 0849 08/30/21 0805  NA 133* 134*  K 4.4 4.2  CL 98 99  CO2 23 26  GLUCOSE 89 95  BUN 15 14  CREATININE 0.70 0.69  CALCIUM 9.5 9.8  TSH 2.30  --    Liver Function Tests: Recent Labs    02/28/21 0849 08/30/21 0805  AST 15 16  ALT 12 14  BILITOT 0.3 0.4  PROT 6.6 6.8   No results for input(s): LIPASE, AMYLASE in the last 8760 hours. No results for input(s): AMMONIA in the last 8760 hours. CBC: Recent Labs    02/28/21 0849 08/30/21 0805  WBC 6.6 6.2  NEUTROABS 3,907 3,559  HGB 13.0 13.2   HCT 39.1 39.1  MCV 91.1 89.5  PLT 367 389   Lipid Panel: No results for input(s): CHOL, HDL, LDLCALC, TRIG, CHOLHDL, LDLDIRECT in the last 8760 hours. TSH: Recent Labs    02/28/21 0849  TSH 2.30   A1C: No results found for: HGBA1C   Assessment/Plan 1. Rectal bleeding - suspect from hemorrhoids - dried blood observed around anus, non tender - admits to hard stools - sennosides-docusate sodium (SENOKOT-S) 8.6-50 MG tablet; Take 1 tablet by mouth daily.  Dispense: 30 tablet; Refill: 5 - CBC with Differential/Platelet  2. Hemorrhoids, unspecified hemorrhoid type - multiple external hemorrhoids observed, circling anus - 2-3 internal hemorrhoids palpated - educated about staying hydrated with water  - sennosides-docusate sodium (SENOKOT-S) 8.6-50 MG tablet; Take 1 tablet by mouth daily.  Dispense: 30 tablet; Refill: 5  3. Vaginal prolapse - hx of vaginal vault prolapse in past - large mass protruding from vagina, tissue red, no skin breakdown - failed pessary due to discomfort - does not have gynecologist at this time- requesting referral - Ambulatory referral to Gynecology  Total time: 28 minutes. Greater than 50% of total time spent doing patient education regarding symptom and medication management regarding hemorrhoids and abnormal vaginal exam.    Next appt: none Zaliah Wissner Anibal Henderson  [facility] [phone]

## 2021-09-18 ENCOUNTER — Telehealth: Payer: Self-pay

## 2021-09-18 NOTE — Telephone Encounter (Signed)
[facility] senior care referring for Vaginal prolapse.

## 2021-09-21 ENCOUNTER — Other Ambulatory Visit: Payer: Self-pay

## 2021-09-21 ENCOUNTER — Telehealth: Payer: Self-pay | Admitting: *Deleted

## 2021-09-21 ENCOUNTER — Ambulatory Visit
Admission: RE | Admit: 2021-09-21 | Discharge: 2021-09-21 | Disposition: A | Discharge status: Home / Self Care | Payer: PPO | Source: Ambulatory Visit | Attending: Internal Medicine | Admitting: Internal Medicine

## 2021-09-21 DIAGNOSIS — M85852 Other specified disorders of bone density and structure, left thigh: Secondary | ICD-10-CM | POA: Diagnosis not present

## 2021-09-21 DIAGNOSIS — Z780 Asymptomatic menopausal state: Secondary | ICD-10-CM | POA: Diagnosis not present

## 2021-09-21 DIAGNOSIS — Z1231 Encounter for screening mammogram for malignant neoplasm of breast: Secondary | ICD-10-CM

## 2021-09-21 DIAGNOSIS — M8580 Other specified disorders of bone density and structure, unspecified site: Secondary | ICD-10-CM

## 2021-09-21 HISTORY — DX: Malignant neoplasm of unspecified site of unspecified female breast: C50.919

## 2021-09-21 IMAGING — MG DIGITAL SCREENING UNILAT RIGHT W/ TOMO W/ CAD
4 series · 4 of 12 positions shown · non-contrast
Comparison: Previous exam(s).

ACR Breast Density Category a: The breast tissue is almost entirely
fatty.

CLINICAL DATA: Screening.

EXAM:
DIGITAL SCREENING UNILATERAL RIGHT MAMMOGRAM WITH CAD AND TOMO

[R MLO synth-2D]
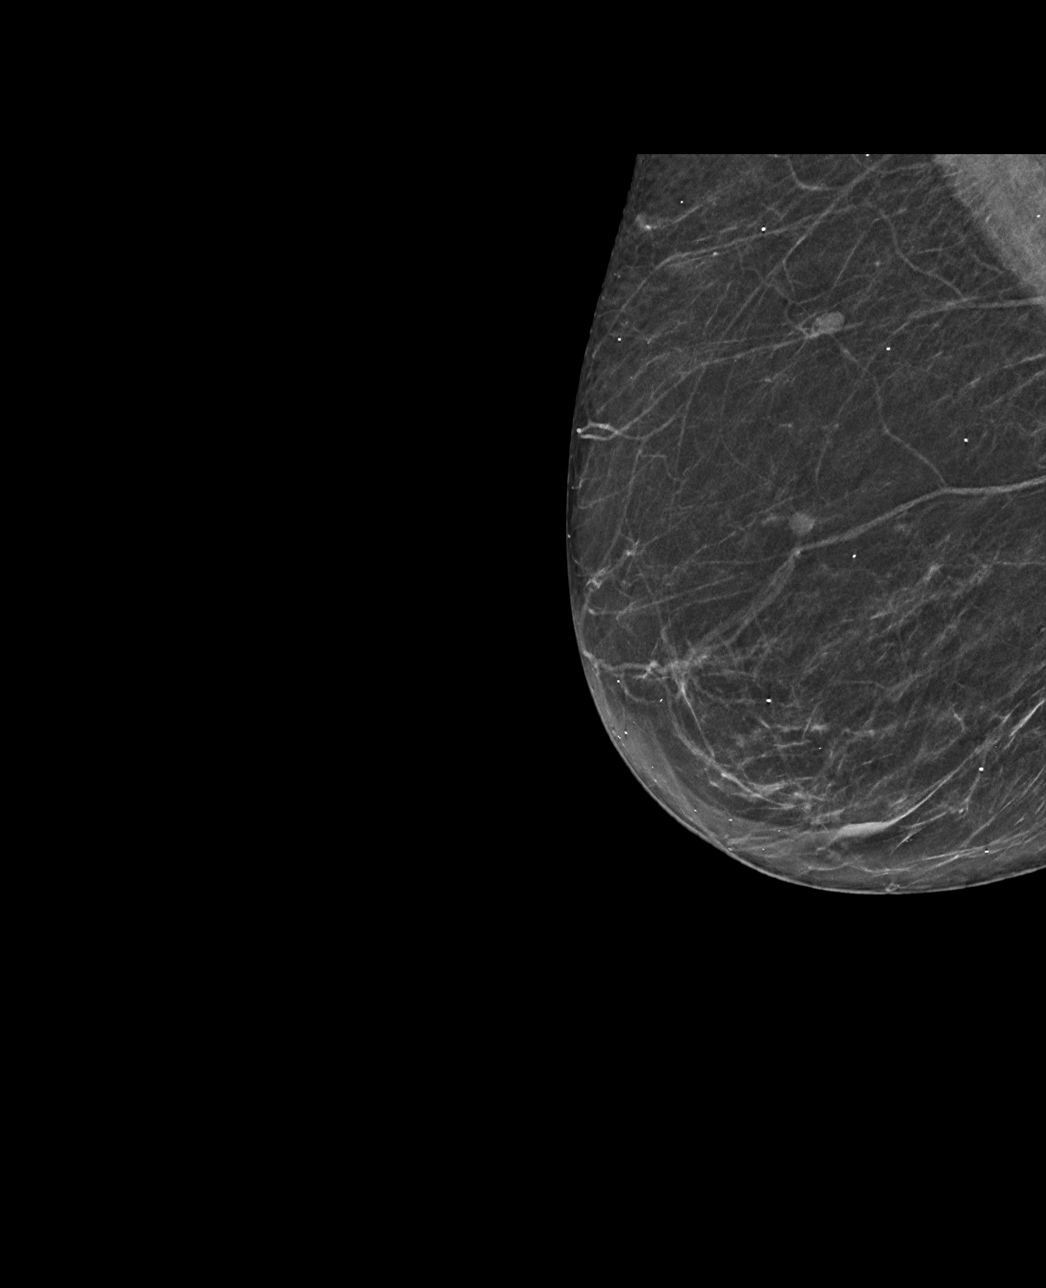

[R CC synth-2D]
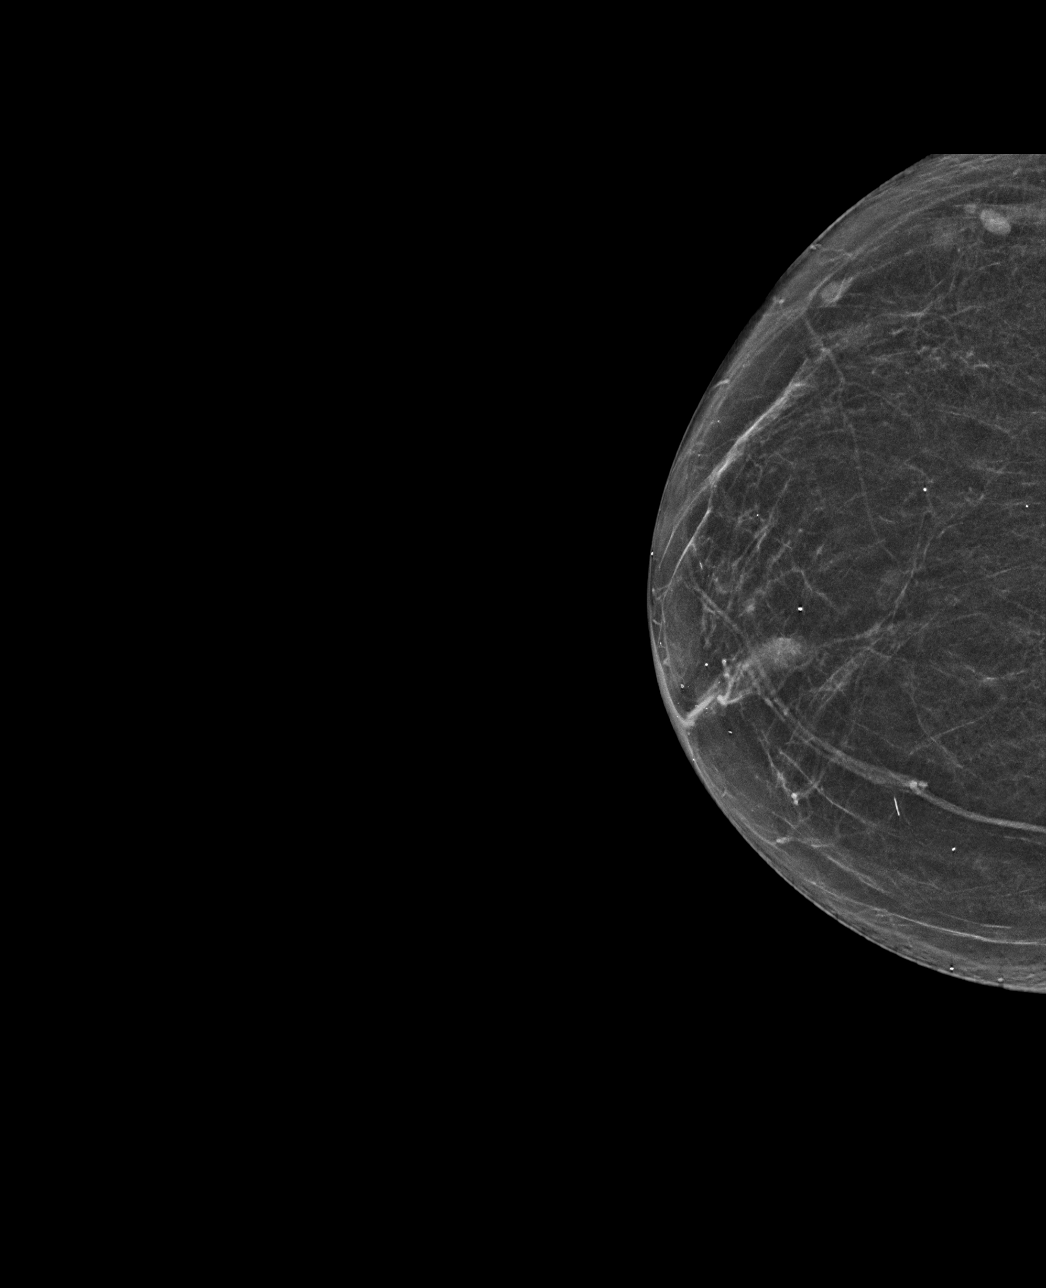

[R MLO tomo · tomo slice 29/57.0]
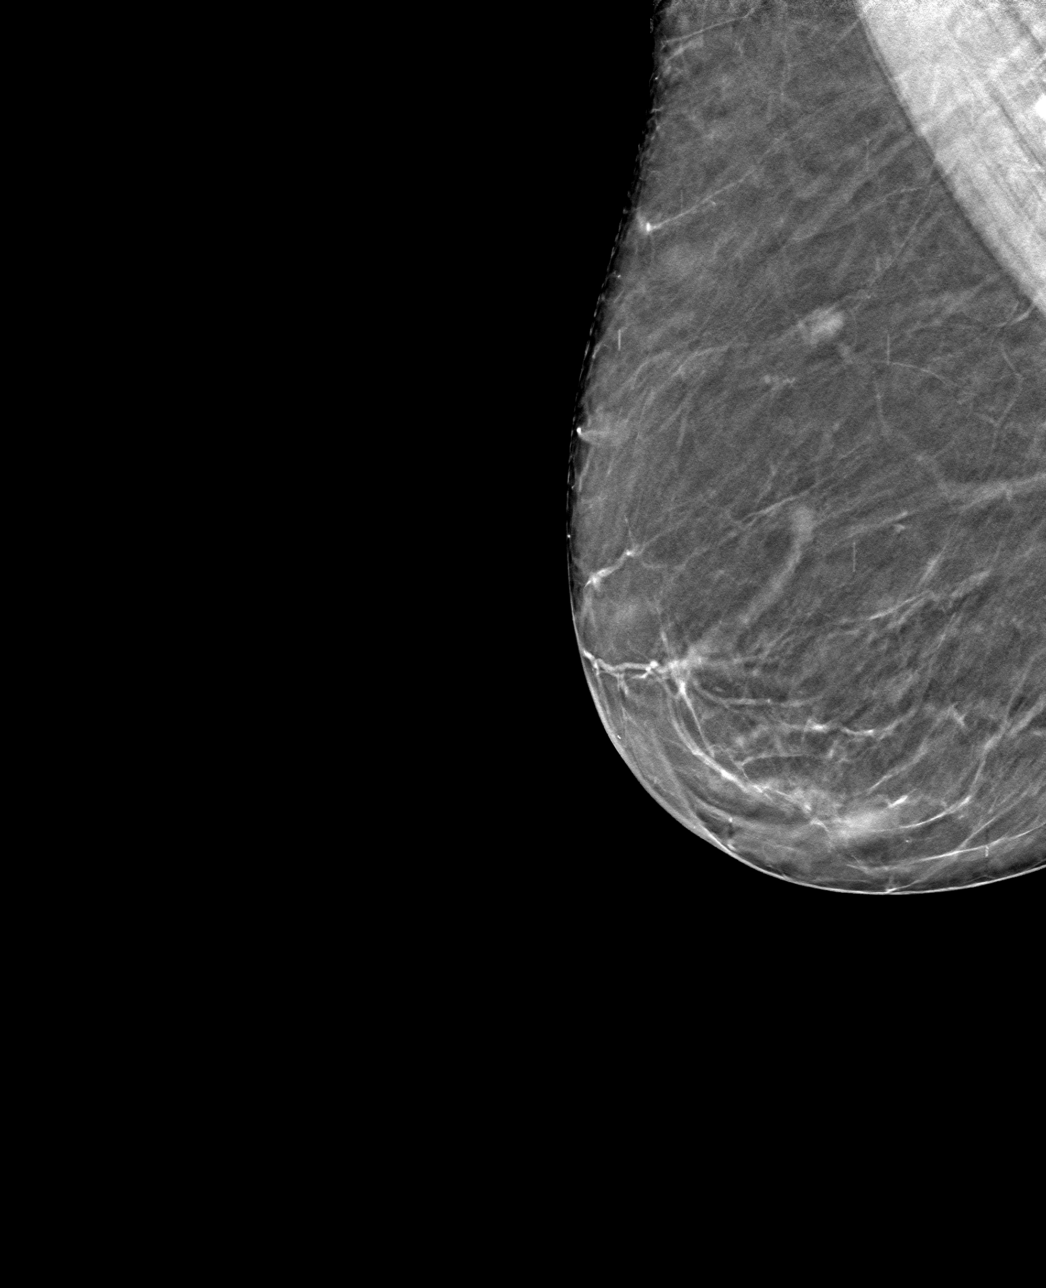

[R CC tomo · tomo slice 27/53.0]
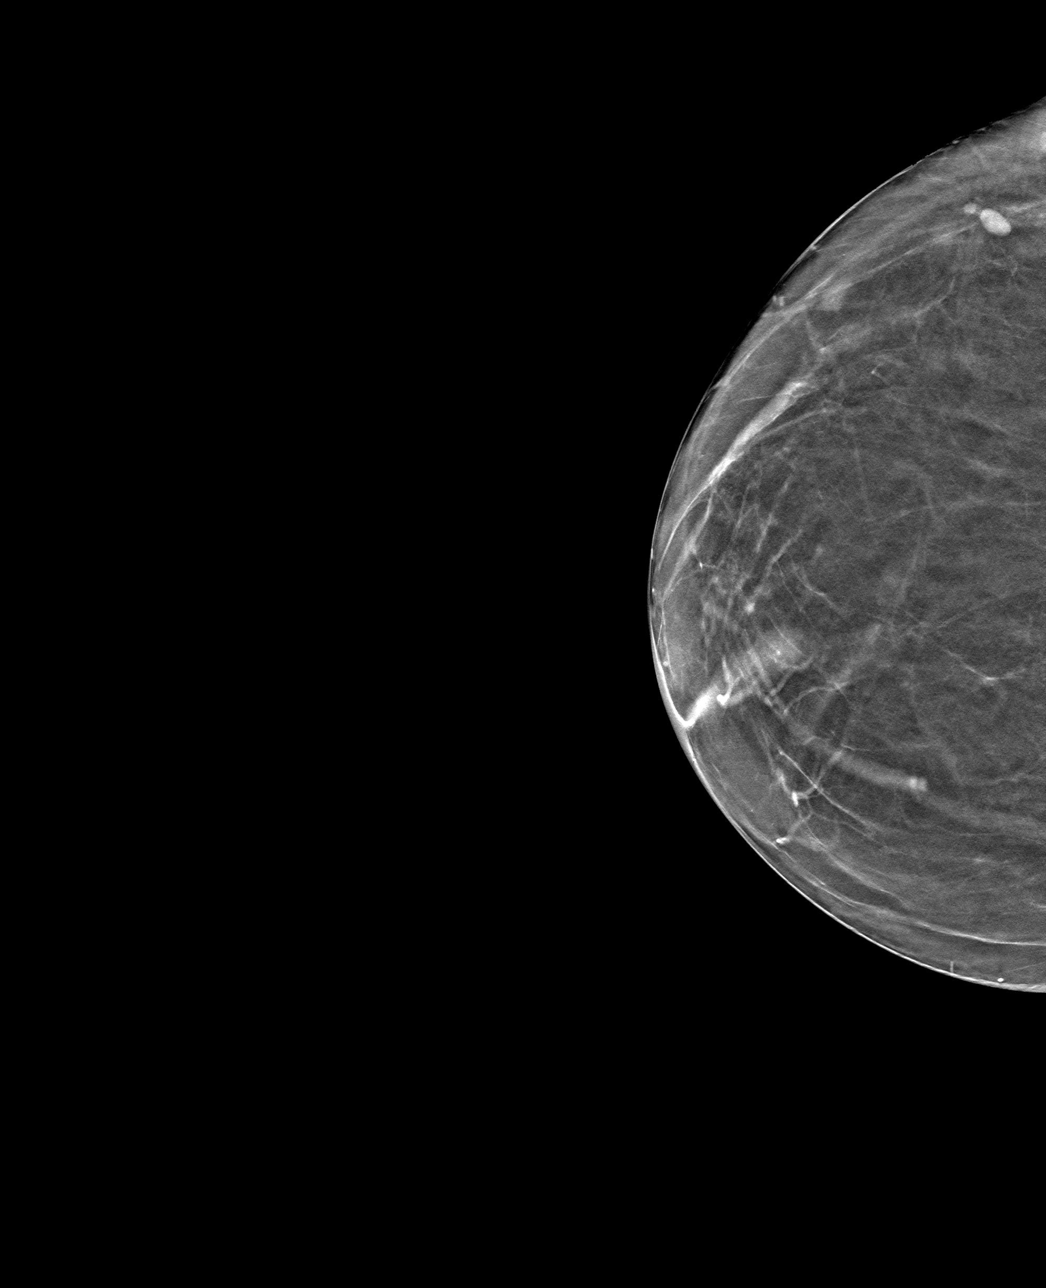

[4 of 12 positions shown; findings below may reference images not displayed]

FINDINGS: The patient has had a left mastectomy. There are no findings
suspicious for malignancy. Images were processed with CAD.
IMPRESSION: No mammographic evidence of malignancy. A result letter of this
screening mammogram will be mailed directly to the patient.

RECOMMENDATION:
Screening mammogram in one year.  (Code:2B-K-UEB)

BI-RADS CATEGORY  1: Negative.

## 2021-09-21 NOTE — Telephone Encounter (Signed)
Patient husband requested to leave voicemail if they did not answer.  LMOM with Amy's response.

## 2021-09-21 NOTE — Telephone Encounter (Signed)
[name], husband, called and left message on Clinical intake stating that patient was seen on Monday and prescribed Senokot 8.6. Stated that she is taking it at [employer].   Stated that she has changed her depends: 10/11- 9 times/day 10/12- 9 times/day 10/13- 9 times/day 10/14- 10 times/day  Stated that she has an appointment with [facility] on 10/09/2021  They are wondering if she still needs to take the medication or what your advise is.   Please Advise.

## 2021-09-21 NOTE — Telephone Encounter (Signed)
Patient husband called back and stated that they do not care too much for Dr. Delilah Shan and he has moved away.   Stated that a referral was made to [facility] by our office for November 1. Stated that they are ok going there or [facility].     General 09/17/2021  5:04 PM Foye Spurling referral -  Note   Referral to [facility] [phone]) [phone]/LM

## 2021-09-21 NOTE — Telephone Encounter (Signed)
Recommend trying to take senokot every other day or as needed. She had seen [facility] in past, Dr. Delilah Shan. I made referral, so they could schedule her. I would recommend she continue to this provider.

## 2021-09-21 NOTE — Telephone Encounter (Signed)
Patient husband notified and agreed.

## 2021-09-21 NOTE — Telephone Encounter (Signed)
I was not aware of this information. I am fine with new referral to [facility]. My apologies.

## 2021-09-25 ENCOUNTER — Other Ambulatory Visit: Payer: Self-pay | Admitting: Internal Medicine

## 2021-10-09 ENCOUNTER — Encounter: Payer: PPO | Admitting: Obstetrics & Gynecology

## 2021-10-09 NOTE — Telephone Encounter (Signed)
Patient Husband, Frances Holland called and stated that patient had an appointment with [facility] this morning but they had to reschedule the appointment due to patient having a bad night.   Rescheduled appointment to 10/29/2021 at 2:50pm   FYI

## 2021-10-09 NOTE — Telephone Encounter (Signed)
Thank you  for update

## 2021-10-29 ENCOUNTER — Ambulatory Visit (INDEPENDENT_AMBULATORY_CARE_PROVIDER_SITE_OTHER): Payer: PPO | Admitting: Obstetrics & Gynecology

## 2021-10-29 ENCOUNTER — Other Ambulatory Visit: Payer: Self-pay

## 2021-10-29 ENCOUNTER — Encounter: Payer: Self-pay | Admitting: Obstetrics & Gynecology

## 2021-10-29 VITALS — BP 120/80 | Ht 64.0 in | Wt 165.0 lb

## 2021-10-29 DIAGNOSIS — N8111 Cystocele, midline: Secondary | ICD-10-CM

## 2021-10-29 NOTE — Patient Instructions (Signed)
How to Use a Vaginal Pessary A vaginal pessary is a removable device that is placed into your vagina to support pelvic organs that droop. These organs include your uterus, bladder, and rectum. When your pelvic organs drop down into your vagina, it causes a condition called pelvic organ prolapse (POP). A pessary may be an alternative to surgery for women with POP. It may help women who leak urine when they strain or exercise (stress incontinence). This is a symptom of POP. A vaginal pessary may also be a temporary treatment for stress incontinence during pregnancy. There are several types of pessaries. All types are usually made of silicone. You can insert and remove some on your own. Other types must be inserted and removed by your health care provider at office visits. The reason you are using a pessary and the severity of your condition will determine which one is best for you. It is also important to find the right size. A pessary that is too small may fall out. A pessary that is too large may cause pain or discomfort. Your health care provider will do a physical exam to find the correct size and fit for your pessary. It may take several appointments to find the best fit for you. If you can be fit with the type of pessary that you can insert, remove, and clean yourself, your health care provider will teach you how to use your pessary at home. You may have checkups every few months. If you have the type of pessary that needs to be inserted and removed by your health care provider, you will have appointments every few months to have the pessary removed, cleaned, and replaced. What are the risks? When properly fitted and cared for, risks of using a vaginal pessary can be small. However, there can be problems that may include: Vaginal discharge. Vaginal bleeding. A bad smell coming from your vagina. Scraping of the skin inside your vagina. How to use your pessary Follow your health care provider's  instructions for using a pessary. These instructions may vary, depending on the type of pessary you have. To insert a pessary: Wash your hands with soap and water for at least 20 seconds. Squeeze or fold the pessary in half and lubricate the tip with a water-based lubricant. Insert the pessary into your vagina. It will unfold and provide support. To remove the pessary, gently tug it out of your vagina. You can remove the pessary every night or after several days. You can also remove it to have sex. How to care for your pessary If you have a pessary that you can remove: Clean your pessary with soap and water. Rinse well. Dry it completely before inserting it back into your vagina. Follow these instructions at home: Take over-the-counter and prescription medicines only as told by your health care provider. Your health care provider may prescribe an estrogen cream to moisten your vagina. Keep all follow-up visits. This is important. Contact a health care provider if: You feel any pain or discomfort when your pessary is in place. You continue to have stress incontinence. You have trouble keeping your pessary from falling out. You have an unusual vaginal discharge that is blood-tinged or smells bad. Summary A vaginal pessary is a removable device that is placed into your vagina to support pelvic organs that droop. This condition is called pelvic organ prolapse (POP). There are several types of pessaries. Some you can insert and remove on your own. Others must be inserted and removed  by your health care provider. The best type for you depends on the reason you are using a pessary and the severity of your condition. It is also important to find the right size. If you can use the type that you insert and remove on your own, your health care provider will teach you how to use it and schedule checkups every few months. If you have the type that needs to be inserted and removed by your health care  provider, you will have regular appointments to have your pessary removed, cleaned, and replaced. This information is not intended to replace advice given to you by your health care provider. Make sure you discuss any questions you have with your health care provider. Document Revised: 05/25/2020 Document Reviewed: 05/25/2020 Elsevier Patient Education  [place].

## 2021-10-29 NOTE — Progress Notes (Signed)
Cystocele/Rectocele Patient is a 87 yo WF who complains of symptoms related to vaginal prolapse and cystocele. Problem started  last year (or prior to that, but first time seen and treated was 2021) . Symptoms include: prolapse of tissue with straining and urinary incontinence: moderate. She wears depends (changes several times daily, more so at night).  No pain or bleeding.  Tried gellhorn pessary last year, 2 and 1/2' expulsion, 3' painful on exchange but worked well w [place]. Due to pain w removal did not put back in.  That was 11/2020.  She has done without this year but has the incontinence mostly that bothers her.  Tried oxybutynin w urologist, no help.  PMHx: She  has a past medical history of Back pain, Breast cancer ([place]), Cancer ([place]), Candidiasis of skin and nails, Cellulitis and abscess of upper arm and forearm, Heartburn, Hemorrhoids (08/20/2016), History of breast cancer (2000), Hurthle cell adenocarcinoma ([place]) (08/20/2016), Hypertension, Insomnia, Knee pain, bilateral (2014), Lymphedema, Memory impairment, Memory loss, Migraine (05/16/2012), Migraine without aura, without mention of intractable migraine without mention of status migrainosus, Other and unspecified hyperlipidemia, Other bursitis disorders, Other infective bursitis, left shoulder, Pain in joint, ankle and foot, Pain in joint, hand, Pain in joint, lower leg, Pain in joint, shoulder region, Pain in joint, shoulder region (04/23/2016), Pain in limb, Rectal bleeding (08/20/2016), Senile osteoporosis, Symptomatic menopausal or female climacteric states, Syncope and collapse, Unspecified hypertensive heart disease without heart failure (1995), Unspecified hypothyroidism, Unspecified polyarthropathy or polyarthritis, site unspecified, Unspecified urinary incontinence, Unspecified visual loss, and Xerophthalmia. Also,  has a past surgical history that includes Total abdominal hysterectomy w/ bilateral salpingoophorectomy (1981); Bilateral  oophorectomy (1981); Breast reduction surgery (Right, 01/2000); Breast reconstruction (Left, 01/2000); Colonoscopy (01/2004); Total knee arthroplasty (Right, 2014); Cataract extraction (Right, 09/04/2005); Thyroid lobectomy (Left, 08/14/2016); Abdominal hysterectomy; and Mastectomy (Left, 10/1999)., family history includes Asthma in her son; Cancer in her daughter; Dementia in her father and mother; Heart disease in her mother.,  reports that she has never smoked. She has never used smokeless tobacco. She reports that she does not drink alcohol and does not use drugs.  She has a current medication list which includes the following prescription(s): amlodipine, amlodipine, apoaequorin, calcium carb-cholecalciferol, cholecalciferol, levothyroxine, losartan-hydrochlorothiazide, multiple vitamins-minerals, omega-3 fatty acids, oxybutynin, polyethyl glycol-propyl glycol, and sennosides-docusate sodium. Also, is allergic to donepezil hcl, namenda [memantine hcl], sulfa antibiotics, and sulfamethoxazole.  Review of Systems  All other systems reviewed and are negative.  Objective: BP 120/80   Ht 5\' 4"  (1.626 m)   Wt 165 lb (74.8 kg)   BMI 28.32 kg/m  Physical Exam Constitutional:      General: She is not in acute distress.    Appearance: She is well-developed.  Genitourinary:     Bladder and urethral meatus normal.     Genitourinary Comments: Cuff intact/ no lesions  Absent uterus and cervix     Vaginal cuff intact.    No vaginal erythema or bleeding.     Anterior and apical vaginal prolapse present.    Moderate vaginal atrophy present.    Vaginal exam comments: Complete vaginal prolapse on initial exam Able to be reduced.      Right Adnexa: not tender and no mass present.    Left Adnexa: not tender and no mass present.    Cervix is absent.     Uterus is absent.     Pelvic exam was performed with patient in the lithotomy position.  HENT:     Head: Normocephalic and  atraumatic.     Nose: Nose  normal.  Abdominal:     General: There is no distension.     Palpations: Abdomen is soft.     Tenderness: There is no abdominal tenderness.  Musculoskeletal:        General: Normal range of motion.  Neurological:     Mental Status: She is alert and oriented to person, place, and time.     Cranial Nerves: No cranial nerve deficit.  Skin:    General: Skin is warm and dry.  Psychiatric:        Attention and Perception: Attention normal.        Mood and Affect: Mood normal.        Speech: Speech normal.        Behavior: Behavior normal.        Cognition and Memory: Cognition normal.        Judgment: Judgment normal.   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

## 2021-11-14 ENCOUNTER — Other Ambulatory Visit: Payer: Self-pay

## 2021-11-14 ENCOUNTER — Ambulatory Visit: Payer: PPO | Admitting: Neurology

## 2021-11-14 ENCOUNTER — Encounter: Payer: Self-pay | Admitting: Neurology

## 2021-11-14 VITALS — BP 105/60 | HR 91 | Ht 64.0 in | Wt 169.0 lb

## 2021-11-14 DIAGNOSIS — F02818 Dementia in other diseases classified elsewhere, unspecified severity, with other behavioral disturbance: Secondary | ICD-10-CM

## 2021-11-14 DIAGNOSIS — G301 Alzheimer's disease with late onset: Secondary | ICD-10-CM | POA: Diagnosis not present

## 2021-11-14 MED ORDER — RIVASTIGMINE 4.6 MG/24HR TD PT24
4.6000 mg | MEDICATED_PATCH | Freq: Every day | TRANSDERMAL | 5 refills | Status: DC
Start: 2021-11-14 — End: 2022-02-17

## 2021-11-14 NOTE — Patient Instructions (Signed)
Rivastigmine skin patches What is this medication? RIVASTIGMINE (ri va STIG meen) is used to treat mild to moderate dementia caused by Parkinson's disease and mild to severe Alzheimer's disease. This medicine may be used for other purposes; ask your health care provider or pharmacist if you have questions. COMMON BRAND NAME(S): Exelon Patch What should I tell my care team before I take this medication? They need to know if you have any of these conditions: application site reaction during previous use of rivastigmine patch heart disease kidney disease liver disease lung or breathing disease, like asthma seizures slow, irregular heartbeat stomach or intestine disease, ulcers, or stomach bleeding trouble passing urine an unusual or allergic reaction to rivastigmine, other medicines, foods, dyes, or preservatives pregnant or trying to get pregnant breast-feeding How should I use this medication? This medicine is for external use only. Follow the directions on the prescription label. Always remove the old patch before you apply a new one. Apply to skin right after removing the protective liner. Do not cut or trim the patch. Apply to an area of the upper arm, chest, or back that is clean, dry and hairless. Avoid injured, irritated, oily, or calloused areas or where the patch will be rubbed by tight clothing or a waistband. Do not place over an area where lotion, cream, or powder was recently used. Press firmly in place until the edges stick well. To prevent skin irritiation, do not apply to the same place more than once every 14 days. Change your patch at the same time each day. Do not use more often than directed. Do not stop using except on your doctor's advice. Contact your pediatrician or health care professional regarding the use of this medicine in children. Special care may be needed. Overdosage: If you think you have taken too much of this medicine contact a poison control center or  emergency room at once. NOTE: This medicine is only for you. Do not share this medicine with others. What if I miss a dose? If you miss a dose, apply a new patch immediately. Then, apply the next patch at the usual time the next day after removing the previous patch. Do not apply 2 patches to make up for the missed one. If treatment is missed for 3 or more days, contact your healthcare provider for further instructions. What may interact with this medication? antihistamines for allergy, cough, and cold atropine certain medicines for bladder problems like oxybutynin, tolterodine certain medicines for Parkinson's disease like benztropine, trihexyphenidyl certain medicines for stomach problems like dicyclomine, hyoscyamine glycopyrrolate ipratropium certain medicines for travel sickness like scopolamine medicines that relax your muscles for surgery other medicines for Alzheimer's disease This list may not describe all possible interactions. Give your health care provider a list of all the medicines, herbs, non-prescription drugs, or dietary supplements you use. Also tell them if you smoke, drink alcohol, or use illegal drugs. Some items may interact with your medicine. What should I watch for while using this medication? Visit your doctor or health care professional for regular checks on your progress. Check with your doctor or health care professional if your symptoms do not start to get better or if they get worse. You may get drowsy or dizzy. Do not drive, use machinery, or do anything that needs mental alertness until you know how this drug affects you. Do not stand or sit up quickly, especially if you are an older patient. This reduces the risk of dizzy or fainting spells. Avoid saunas and  prolonged exposure to sunlight. You may bathe, swim, shower, or participate in any of your normal activities while wearing this patch. If the patch falls off, apply a new patch for the rest of the day, then  replace the patch the next day at the usual time. If you are going to have a magnetic resonance imaging (MRI) procedure, tell your MRI technician if you have this patch on your body. It must be removed before a MRI. What side effects may I notice from receiving this medication? Side effects that you should report to your doctor or health care professional as soon as possible: allergic reactions like skin rash, itching or hives, swelling of the face, lips, or tongue application site reaction (such as skin redness, blisters, or swelling under or around the patch site) changes in vision or balance dizziness feeling faint or lightheaded, falls increase in frequency of passing urine or incontinence nervousness, agitation, or increased confusion redness, blistering, peeling or loosening of the skin, including inside the mouth severe diarrhea slow heartbeat or palpitations stomach pain sweating uncontrollable movements vomiting weight loss Side effects that usually do not require medical attention (report to your doctor or health care professional if they continue or are bothersome): headache indigestion or heartburn loss of appetite mild diarrhea, especially when starting treatment nausea This list may not describe all possible side effects. Call your doctor for medical advice about side effects. You may report side effects to FDA at 1-800-FDA-1088. Where should I keep my medication? Keep out of the reach of children. Store at room temperature between 15 and 30 degrees C (59 and 86 degrees F). Store in original pouch until just prior to use. Throw away any unused medicine after the expiration date. Dispose of used patches properly. Since used patches may still contain active medicine, fold the patch in half so that it sticks to itself prior to disposal. NOTE: This sheet is a summary. It may not cover all possible information. If you have questions about this medicine, talk to your doctor,  pharmacist, or health care provider.  2022 Elsevier/Gold Standard (2015-07-21 00:00:00)

## 2021-11-14 NOTE — Progress Notes (Signed)
[facility]  Provider:  Dr Edna Rede Referring Provider: Virgie Dad, MD Primary Care Physician:  Virgie Dad, MD  Chief Complaint  Patient presents with   New Patient (Initial Visit)    RM 10. Internal referral for late onset alzheimers. No falls, ambulates w/ rolling walker.     HPI:  Frances Holland is a 87 y.o. female and seen here upon referral from Dr. Lyndel Safe for a Consultation/ Evaluation of Dementia; patient has been followed by Dr. Jannifer Franklin. Frances Holland is a new patient to me and she introduced herself today here in the presence of her husband.  They provided me with a list of her immunizations, surgeries and current medication.  As to surgeries the patient had a hysterectomy in 1981 mastectomy of the left breast in December 2000 at [facility] in [place] a breast reconstruction at the same place in the same months.  She had colonoscopies in February 2004 in [place] she had cataract surgery at [facility] in May 2007 and in March of the same year.  A right knee replacement total was performed in July 2014, she had a partial thyroid removal and [facility] in [place] in September 2017.  An amputation of the second toe of the left foot February 2019.  She has used a pessary since October 2021 to help with urinary incontinence and it was recently removed and replaced by Dr. Barnett Applebaum of [facility] in [place].  Will be in October 2022 she had her last CBC and differential this was a normal result.  Medical assistant today performed a [place] cognitive assessment with the patient in which she only scored 11 out of 30 points.    In the past she only was tested by Mini-Mental status examination.  I recommend  to resume the Mini-Mental status examination.  The main deficits are clearly in the very short-term memory range but also in the executive function. Her husband asked to be referred?  The patient was unable to tolerate  the dementia po medications ( but her husband not noted any side effects) and was referred by Dr Jannifer Franklin back to PCP. Dr Lyndel Safe.  The husband has indicated that the patient did resist taking any medications. What is the reason for her returning to [place]?    04/04/20  Frances Holland is an 87 year old female with history of progressive memory disturbance.  She has been unable to tolerate Aricept, Namenda, Exelon.  She remains on Prevagen. She continues [facility].  She does not drive.  Her memory has overall been stable.  She now has [employer] coming 1 day a week for 3 hours, so far so good.  She sleeps well, does not go to sleep till midnight.  She walks 20 minutes 4 times a week with her husband.  She uses a walker, pretty much all the time.  Since November, has had 4 falls.  Her husband manages her medications and affairs.  She does her own ADLs, and keeps the house neat, sets the table, and manages the laundry.  Her daughter passed away from ovarian cancer last year. She presents today for follow-up accompanied by her husband.  HISTORY 10/04/2019 SS: Frances Holland is an 87 year old female with history of a progressive memory disturbance.  She has been unable to tolerate Aricept, Namenda, or Exelon.  She presents today for follow-up accompanied by her husband.  They continue to live in independent living [facility].  She feels that her memory has been stable since last seen.  She does not drive a car.  She is able to perform their housework quite well.  She keeps the house neat, sets the table, and manages the laundry.  In general she sleeps well at night, but she reports it takes her a long time to fall asleep.  She has not had any recent falls.  She does not use any assistive devices for ambulation.  She and her husband walk on average 20 minutes daily.  She enjoys reading and doing crossword puzzles. Her husband manages their finances. Of recent, she has tried to  access some of her accounts and has been unable to because she can't answer the initial security questions.  She is taking Prevagen daily.  She completed speech therapy for her memory, but felt it was not helpful.  She has paperwork today for Dr. Jannifer Franklin to sign allowing her husband to manage her financial affairs.    Review of Systems: Out of a complete 14 system review, the patient complains of only the following symptoms, and all other reviewed systems are negative;   cognitive dysfunction, frustration , and reluctance to take medication.   Social History   Socioeconomic History   Marital status: Married    Spouse name: Not on file   Number of children: 3   Years of education: 14   Highest education level: Not on file  Occupational History   Occupation: retired Estate agent   Tobacco Use   Smoking status: Never   Smokeless tobacco: Never  Vaping Use   Vaping Use: Never used  Substance and Sexual Activity   Alcohol use: No   Drug use: No   Sexual activity: Never    Comment: 1st intercourse 87 yo-Fewer than 5 partners  Other Topics Concern   Not on file  Social History Narrative   [facility] since 07/07/2014   Married -[name]   Never smoked   Right handed     Alcohol none   Caffeine 2 cups of coffee daily   Exercise walks daily, exercise class half hour 2-3 times a week   Living will,  POA   Patient believes that her memory is OK except for recall of names. Her husband is concerned about it. She scored 29/30 on MMSE in Dec 2017. They both have concerns about the future if he precedes her in death, but he has written down how he keeps accounts and has scheduled an appt with an accountant to do their taxes. They are a 2nd marriage and have his and her families although the "children" get along. They have been married 30 years.   Social Determinants of Health   Financial Resource Strain: Not on file  Food Insecurity: Not on file  Transportation Needs: Not  on file  Physical Activity: Not on file  Stress: Not on file  Social Connections: Not on file  Intimate Partner Violence: Not on file    Family History  Problem Relation Age of Onset   Heart disease Mother    Dementia Mother    Dementia Father    Cancer Daughter    Asthma Son     Past Medical History:  Diagnosis Date   Back pain    Breast cancer ([place])    Cancer ([place])    left breaset mastectomy    Candidiasis of skin and nails    Cellulitis and abscess of upper arm and forearm  Heartburn    Hemorrhoids 08/20/2016   History of breast cancer 2000   History left mastectomy   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   Hypertension    Insomnia    Knee pain, bilateral 2014   Lymphedema    Left arm following mastectomy   Memory impairment    Memory loss    Migraine 05/16/2012   Migraine without aura, without mention of intractable migraine without mention of status migrainosus    patient denies at preop of 08/08/16    Other and unspecified hyperlipidemia    Other bursitis disorders    Other infective bursitis, left shoulder    Pain in joint, ankle and foot    Pain in joint, hand    Pain in joint, lower leg    Pain in joint, shoulder region    Pain in joint, shoulder region 04/23/2016   Pain in limb    Rectal bleeding 08/20/2016   Senile osteoporosis    Symptomatic menopausal or female climacteric states    Syncope and collapse    Unspecified hypertensive heart disease without heart failure 1995   Unspecified hypothyroidism    Unspecified polyarthropathy or polyarthritis, site unspecified    Unspecified urinary incontinence    Unspecified visual loss    Xerophthalmia     Past Surgical History:  Procedure Laterality Date   ABDOMINAL HYSTERECTOMY     BILATERAL OOPHORECTOMY  1981   BREAST RECONSTRUCTION Left 01/2000   BREAST REDUCTION SURGERY Right 01/2000   CATARACT EXTRACTION Right 09/04/2005   COLONOSCOPY  01/2004   MASTECTOMY Left 10/1999   THYROID  LOBECTOMY Left 08/14/2016   Procedure: NEAR TOTAL THYROIDECTOMY;  Surgeon: Johnathan Hausen, MD;  Location: [facility];  Service: General;  Laterality: Left;   TOTAL ABDOMINAL HYSTERECTOMY W/ BILATERAL SALPINGOOPHORECTOMY  1981   TOTAL KNEE ARTHROPLASTY Right 2014    Current Outpatient Medications  Medication Sig Dispense Refill   amLODipine (NORVASC) 2.5 MG tablet TAKE 1 TABLET BY MOUTH ONCE DAILY TO LOWER BLOOD PRESSURE 90 tablet 0   amLODipine (NORVASC) 5 MG tablet TAKE 1 TABLET BY MOUTH ONCE DAILY TAKE WITH THE 2.5 MG FOR A TOTAL OF 7.5 MG. 90 tablet 0   Apoaequorin (PREVAGEN PO) Take 1 tablet by mouth daily. Regular strength 1 after breakfast     Calcium Carb-Cholecalciferol (CALCIUM 600-D PO) Take 1 tablet by mouth daily.     Cholecalciferol 5000 units capsule Take 5,000 Units by mouth daily.     levothyroxine (SYNTHROID) 112 MCG tablet Take 1 tablet (112 mcg total) by mouth daily before breakfast. 90 tablet 1   losartan-hydrochlorothiazide (HYZAAR) 100-12.5 MG tablet Take 1 tablet by mouth once daily 90 tablet 1   Multiple Vitamins-Minerals (CENTRUM SILVER PO) Take 1 tablet by mouth daily.      Omega-3 Fatty Acids (OMEGA-3 CF PO) Take 520 mg by mouth daily.      oxybutynin (DITROPAN XL) 15 MG 24 hr tablet Take 15 mg by mouth daily.     Polyethyl Glycol-Propyl Glycol 0.4-0.3 % SOLN Place 2 drops into both eyes daily as needed (dry eyes).      sennosides-docusate sodium (SENOKOT-S) 8.6-50 MG tablet Take 1 tablet by mouth daily. 30 tablet 5   No current facility-administered medications for this visit.    Allergies as of 11/14/2021 - Review Complete 11/14/2021  Allergen Reaction Noted   Donepezil hcl  07/01/2018   Namenda [memantine hcl]  03/31/2019   Sulfa antibiotics  08/01/2014   Sulfamethoxazole Rash 11/14/2015  Vitals: BP 105/60 (BP Location: Right Arm, Patient Position: Sitting, Cuff Size: Normal)   Pulse 91   Ht 5\' 4"  (1.626 m)   Wt 169 lb (76.7 kg)   SpO2 99%   BMI 29.01  kg/m  Last Weight:  Wt Readings from Last 1 Encounters:  11/14/21 169 lb (76.7 kg)   Last Height:   Ht Readings from Last 1 Encounters:  11/14/21 5\' 4"  (1.626 m)   Last BMI: @LASTBMI  Physical exam:  General: The patient is awake, alert and appears not in acute distress.  The patient is well groomed. Head: Normocephalic, atraumatic.  Neck is supple.    Cardiovascular:  Regular rate and palpable peripheral pulse:  Respiratory: clear to auscultation, Skin:  Without evidence of edema, or rash  Neurologic exam : The patient is awake and alert, oriented to place and time.  Memory  impaired at Moderate DEMENTIA level- I am not offering any news here. There is a normal attention span & concentration ability.  Speech is fluent without  dysarthria, dysphonia .  Mood and affect are defiant.  Cranial nerves: Pupils are equal and briskly reactive to light. Funduscopic exam deferred. Left sided lower eye lids.  Extraocular movements  in vertical and horizontal planes intact and without nystagmus.  Visual fields by finger perimetry are intact. Hearing to finger rub intact.  Facial sensation intact to fine touch. Facial motor strength is symmetric and tongue and uvula move midline.  Motor exam:   Normal tone and normal muscle bulk and symmetric normal strength in all extremities. Grip Strength was excellent.  Proximal strength of shoulder muscles and hip flexors was normal .  Sensory:  Fine touch and vibration were tested . Proprioception was tested in the upper extremities only and was normal.  Coordination: Rapid alternating movements in the fingers/hands were normal.  Finger-to-nose maneuver was tested and showed no evidence of ataxia, dysmetria or tremor.  Gait and station: Patient walked without assistive device:  Core Strength within normal limits.   Deep tendon reflexes: 1 plus.    In the past she only was tested by Mini-Mental status examination.  I recommend  to resume the  Mini-Mental status examination.  The main deficits are clearly in the very short-term memory range but also in the executive function. Her husband asked to be referred?  The patient was unable to tolerate the dementia po medications ( but her husband not noted any side effects) and was referred by Dr Jannifer Franklin back to PCP. Dr Lyndel Safe.  The husband has indicated that the patient did resist taking any medications. What is the reason for her returning to [place]?  I offered her a patch excelon to try if she had GI symptoms.   This is a consultation, the patient had been referred back to PCP;    Assessment: Total time for face to face interview and examination, for review of  images and laboratory testing, neurophysiology testing and pre-existing records, including out-of -network , was 45 minutes. Assessment is as follows here:   1)  ALZHEIMERS TYPE DEMENTIA < MODERATE -SEVERITY with late age onset.  2) visio impairment 82 DITROPAN not helpful in dementia, as anticholinergic.   Plan:  Treatment plan and additional workup planned after today includes:   1) exelon lowest dose patch available, q 24 hours.   2) can be provided by PCP,    Larey Seat, MD

## 2021-12-25 ENCOUNTER — Telehealth: Payer: Self-pay | Admitting: Neurology

## 2021-12-25 NOTE — Telephone Encounter (Signed)
At 11:38 pt's husband left a vm stating the 1st time they picked up the rivastigmine (EXELON) 4.6 mg/24hr they paid $15.00 .  On 12-20-21 when they went to pick up the patches again they paid $100.00 Husband called [employer] to find out why such a difference in cost, he was told to have the Doctor to call [phone] and to request an exception(which is not based on income) he said based on that request they should then be able to allow pt to resume getting the patches for $15.00, please call.

## 2021-12-25 NOTE — Telephone Encounter (Signed)
Tried submitting PA on CMM. Key: [id number]. Received the following response: "Patient not eligible (does not have coverage with the PBM/payer"  Called [phone]. This was to [facility]. Was able to start case with this. Key: [id number]. Submitted for review and waiting on determination from [place] [employer].

## 2021-12-26 NOTE — Telephone Encounter (Addendum)
Received fax from [facility] advantage that PA request cancelled, stating pharmacy got paid claim on 12/20/21. I will try re-submitting as a tier exception instead to see if this will help lower cost.   I was unable to initiate on CMM. I will try to call them to see if tier exception option. I called them at [phone]. Spoke with rep. All PA reps busy at time of call. Requested they fax tier exception form to [place] at [phone] to complete. He will forward to PA team. They will then reach out to our office.

## 2021-12-26 NOTE — Telephone Encounter (Addendum)
Called and spoke w/ husband to let him know auth submitted and pending review w/ insurance right now. Once we receive determination back, we will f/u with him. He verbalized understanding.

## 2021-12-31 NOTE — Telephone Encounter (Addendum)
Called [phone] because we have not received requested form to complete tier exception via fax.  He states medication does not need PA, they have received paid claim. I reiterated that we want to do tier exception. His response: it is already a tier 4. Advised we would like to do tier exception in order to lower cost of drug and get to lower tier. He placed me on hold. He states he is coordinating with correct team on this and put me back on hold.  He asked that I go to [email or web address]. Had me print medicare prescription drug coverage determination form. Asked that in subject line, mention tier exception request. In process of completing this request.

## 2022-01-01 ENCOUNTER — Other Ambulatory Visit: Payer: Self-pay | Admitting: Internal Medicine

## 2022-01-01 NOTE — Telephone Encounter (Signed)
I faxed completed/signed tier exception form to [employer] at [phone]. Received fax confirmation. Marked urgent. Waiting on determination.

## 2022-01-01 NOTE — Telephone Encounter (Signed)
Patient has request refill on medications "Amlodipine 2.5mg , and 5mg . Patient medications have High Risk Warnings. Medication pend and sent to Windell Moulding, NP.

## 2022-01-08 DIAGNOSIS — E559 Vitamin D deficiency, unspecified: Secondary | ICD-10-CM | POA: Diagnosis not present

## 2022-01-08 DIAGNOSIS — M1990 Unspecified osteoarthritis, unspecified site: Secondary | ICD-10-CM | POA: Diagnosis not present

## 2022-01-08 DIAGNOSIS — E785 Hyperlipidemia, unspecified: Secondary | ICD-10-CM | POA: Diagnosis not present

## 2022-01-08 DIAGNOSIS — K589 Irritable bowel syndrome without diarrhea: Secondary | ICD-10-CM | POA: Diagnosis not present

## 2022-01-08 DIAGNOSIS — E039 Hypothyroidism, unspecified: Secondary | ICD-10-CM | POA: Diagnosis not present

## 2022-01-08 DIAGNOSIS — C73 Malignant neoplasm of thyroid gland: Secondary | ICD-10-CM | POA: Diagnosis not present

## 2022-01-08 DIAGNOSIS — F0390 Unspecified dementia without behavioral disturbance: Secondary | ICD-10-CM | POA: Diagnosis not present

## 2022-01-08 DIAGNOSIS — I10 Essential (primary) hypertension: Secondary | ICD-10-CM | POA: Diagnosis not present

## 2022-01-08 DIAGNOSIS — F411 Generalized anxiety disorder: Secondary | ICD-10-CM | POA: Diagnosis not present

## 2022-01-08 DIAGNOSIS — G43909 Migraine, unspecified, not intractable, without status migrainosus: Secondary | ICD-10-CM | POA: Diagnosis not present

## 2022-01-08 DIAGNOSIS — F321 Major depressive disorder, single episode, moderate: Secondary | ICD-10-CM | POA: Diagnosis not present

## 2022-01-08 DIAGNOSIS — E663 Overweight: Secondary | ICD-10-CM | POA: Diagnosis not present

## 2022-01-08 NOTE — Telephone Encounter (Addendum)
Called [employer] at [phone]. Tier exception approved 01/01/22-12/08/22. Went from tier 4 to tier 2. He will fax approval to [phone].  Called husband and informed him of approval. He also received notice of approval. Nothing further needed.

## 2022-01-28 ENCOUNTER — Telehealth: Payer: Self-pay | Admitting: *Deleted

## 2022-01-28 ENCOUNTER — Telehealth: Payer: Self-pay | Admitting: Neurology

## 2022-01-28 NOTE — Telephone Encounter (Signed)
I will be there on Thurs and write the order.

## 2022-01-28 NOTE — Telephone Encounter (Signed)
Pt's husband requesting refill for rivastigmine (EXELON) 4.6 mg/24hr. Pharmacy [facility], [place].

## 2022-01-28 NOTE — Telephone Encounter (Signed)
Called the husband back to advise that this was sent to the pharmacy in dec with 5 refills to the pharmacy he is requested. He verbalized understanding. He states that they are moving to a new assisted living facility that will mean they will get meds through their pharmacy. At this time he doesn't know they name of the pharmacy they use but will reach back out to give that information.  [facility] into.

## 2022-01-28 NOTE — Telephone Encounter (Signed)
Patient's husband [name], called and stated that [facility] are moving into [facility] on Wednesday and patient needs an order given to [facility] so patient can use a bed board on her bed to help her get up better. Stated that she has been using it and it works well.   Needs order given to [facility].  Please Advise.

## 2022-01-28 NOTE — Telephone Encounter (Signed)
The husband also mentioned that in the last week the patient has developed redness at the site where the patch has been placed. Pt's husband did confirm he changes the site daily and it wasn't until last night that she had some itching associated with it. Pt's husband verbalized understanding and will continue to monitor.

## 2022-02-04 DIAGNOSIS — R2681 Unsteadiness on feet: Secondary | ICD-10-CM | POA: Diagnosis not present

## 2022-02-04 DIAGNOSIS — U071 COVID-19: Secondary | ICD-10-CM | POA: Diagnosis not present

## 2022-02-04 DIAGNOSIS — M79651 Pain in right thigh: Secondary | ICD-10-CM | POA: Diagnosis not present

## 2022-02-04 DIAGNOSIS — M6281 Muscle weakness (generalized): Secondary | ICD-10-CM | POA: Diagnosis not present

## 2022-02-04 DIAGNOSIS — Z9181 History of falling: Secondary | ICD-10-CM | POA: Diagnosis not present

## 2022-02-12 DIAGNOSIS — U071 COVID-19: Secondary | ICD-10-CM | POA: Diagnosis not present

## 2022-02-12 DIAGNOSIS — R2681 Unsteadiness on feet: Secondary | ICD-10-CM | POA: Diagnosis not present

## 2022-02-12 DIAGNOSIS — Z9181 History of falling: Secondary | ICD-10-CM | POA: Diagnosis not present

## 2022-02-12 DIAGNOSIS — M6281 Muscle weakness (generalized): Secondary | ICD-10-CM | POA: Diagnosis not present

## 2022-02-12 DIAGNOSIS — M79651 Pain in right thigh: Secondary | ICD-10-CM | POA: Diagnosis not present

## 2022-02-12 NOTE — Telephone Encounter (Signed)
Pt's husband called;  area were patch located having some redness, area is raised. Want to know if she needs to stop the patch. Would like a call from the nurse. ?

## 2022-02-12 NOTE — Telephone Encounter (Signed)
Attempted to call the number on file and it went straight to the same message. I also tried the mobile number listed on file which did ring but also said the same message.  ? ?

## 2022-02-12 NOTE — Telephone Encounter (Signed)
I have attempted to call the number listed multiple times an it states "call can not be completed at this time" ?I will continue to keep trying to reach out.  ? ?*I discussed with Dr Brett Fairy and due to the patient has tried and failed so many oral medications the patch is the better option for her.  ?As as the redness where the patch goes is clearing up once removed and as long as not causing discomfort to the patient she would recommend to continue using the patch. If pt complains that she is unable to tolerate or seems to be uncomfortable or the redness is not clearing up then we need to discuss stopping.  ?

## 2022-02-13 ENCOUNTER — Telehealth: Payer: Self-pay

## 2022-02-13 NOTE — Telephone Encounter (Signed)
Discussed response with Mr.Gordon, who verbalized understanding.  ? ?All future appointments were canceled  ?

## 2022-02-13 NOTE — Telephone Encounter (Signed)
[phone] is the only working number available for both the pt and the husband. I advised I will remove the other numbers on file to prevent further delay in responding to their call.  ? ?He was appreciative for the call back. The husband states that the redness at the site is lasting as long as a week. It now has a raised area lasting for 3 days and counting. I asked the husband if these irritation area are bothering the patient and he states that it is causing itching and they are having to try and treat by using a cream. I did confirm that the pt has tried and failed the 3 oral medications. He states she does continue to take prevagen and will continue to do this. I asked if they were opposed to not being on any other medication. Informed that we have tried all the medication that typically is used and available. He states that every time she tries something she has some kind of reaction. I advised I will bring this to Dr Dohmeier's attention and in the meantime she can stop taking the patch. Advised that if she has any additional suggestions I will reach out to let them know otherwise they should continue with the prevagen and monitor how she is off of the patch. Advised if symptoms arise or anything concerning he can contact us. Pt verbalized understanding. He asked this be emailed to him at [email or web address].  ?

## 2022-02-13 NOTE — Telephone Encounter (Signed)
Exelon patch causes skin irritation, rash and itching.  ?The patient had failed the 3 oral medications used in treatment of dementia. We a will d/c the skin patch .  ?Patient's husband has purchased Prevagen OTC and she tolerates this supplement.  ?I have no additional medications to offer at this point.  ? ?

## 2022-02-13 NOTE — Telephone Encounter (Signed)
1.) Incoming call received from patients husband stating he needs clarity on the process of patient being further evaluated for her memory. Patient recently moved to [place] ? ?Patient seen Dr.Dohmeier for this concern in December. Please advise ? ?(This concern may need to be addressed via the [facility] nurse) ? ?Side note: Patient still has appointments pending, should this be canceled due to patients move to assisted living.  ? ?2.) Patient is asking if he should file a long-term care claim. Please advise  ?

## 2022-02-13 NOTE — Telephone Encounter (Signed)
Tell him I will come to his room around 10.30 tomorrow to talk about all this.  ?And yes we need to cancel her Appointments ?

## 2022-02-14 ENCOUNTER — Non-Acute Institutional Stay: Payer: PPO | Admitting: Internal Medicine

## 2022-02-14 DIAGNOSIS — F0280 Dementia in other diseases classified elsewhere without behavioral disturbance: Secondary | ICD-10-CM | POA: Diagnosis not present

## 2022-02-14 DIAGNOSIS — E785 Hyperlipidemia, unspecified: Secondary | ICD-10-CM | POA: Diagnosis not present

## 2022-02-14 DIAGNOSIS — N3946 Mixed incontinence: Secondary | ICD-10-CM

## 2022-02-14 DIAGNOSIS — E039 Hypothyroidism, unspecified: Secondary | ICD-10-CM

## 2022-02-14 DIAGNOSIS — R21 Rash and other nonspecific skin eruption: Secondary | ICD-10-CM | POA: Diagnosis not present

## 2022-02-14 DIAGNOSIS — I10 Essential (primary) hypertension: Secondary | ICD-10-CM

## 2022-02-14 DIAGNOSIS — G301 Alzheimer's disease with late onset: Secondary | ICD-10-CM | POA: Diagnosis not present

## 2022-02-15 ENCOUNTER — Encounter: Payer: Self-pay | Admitting: Internal Medicine

## 2022-02-15 DIAGNOSIS — M6281 Muscle weakness (generalized): Secondary | ICD-10-CM | POA: Diagnosis not present

## 2022-02-15 DIAGNOSIS — M79651 Pain in right thigh: Secondary | ICD-10-CM | POA: Diagnosis not present

## 2022-02-15 DIAGNOSIS — U071 COVID-19: Secondary | ICD-10-CM | POA: Diagnosis not present

## 2022-02-15 DIAGNOSIS — Z9181 History of falling: Secondary | ICD-10-CM | POA: Diagnosis not present

## 2022-02-15 DIAGNOSIS — R2681 Unsteadiness on feet: Secondary | ICD-10-CM | POA: Diagnosis not present

## 2022-02-15 NOTE — Progress Notes (Signed)
Location:   [facility] Room Number: 24 Place of Service:  ALF [phone]) Provider:  Veleta Miners MD   Virgie Dad, MD  Patient Care Team: Virgie Dad, MD as PCP - General (Internal Medicine) Reynold Bowen, MD as Consulting Physician (Endocrinology)  Extended Emergency Contact Information Primary Emergency Contact: Tomlinson,Gordon Address: [address]          [place], [place] [address] [name] of [place] Phone: [phone] Mobile Phone: [phone] Relation: Spouse Secondary Emergency Contact: [name],[name] Address: [address]          [place], [place] [address] [name] of [place] Phone: [phone] Work Phone: [phone] Mobile Phone: [phone] Relation: Son  Code Status:  DNR Managed Care Goals of care: Advanced Directive information Advanced Directives 02/15/2022  Does Patient Have a Medical Advance Directive? Yes  Type of Paramedic of [place];Living will  Does patient want to make changes to medical advance directive? No - Patient declined  Copy of [name] in Chart? Yes - validated most recent copy scanned in chart (See row information)  Pre-existing out of facility DNR order (yellow form or pink MOST form) -     Chief Complaint  Patient presents with   Medical Management of Chronic Issues    HPI:  Pt is a 88 y.o. female seen today for medical management of chronic diseases.    Patient has h/o Hypertension, Hypothyroidism, Insomnia and Cognitive impairment. Also Has h/o Skin Cancer and Thyroid Cancer Patient lives with her husband in [place] and is independent in her ADLs.  Walks with her walker.   Recent Admission AL  Urinary incontinence mixed Is on Ditropan has followed with urology has failed pessary. Now has moved to assisted living to get some help with her changing the dressing Cognitive impairment Is has failed Namenda and Aricept was  started on Exelon patch by neurology.  Patient developed rash and itching in her back.  They have decided to stop the patch for now. Needs help with her medication supervision and her dressing and shower   She has not had any recent falls.  Walks with her walker They had lost their daughter last year.  The patient is doing better.  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   Hypertension    Insomnia    Knee pain, bilateral 2014   Lymphedema    Left arm following mastectomy   Memory impairment    Memory loss    Migraine 05/16/2012   Migraine without aura, without mention of intractable migraine without mention of status migrainosus    patient denies at preop of 08/08/16    Other and unspecified hyperlipidemia    Other bursitis disorders    Other infective bursitis, left shoulder    Pain in joint, ankle and foot    Pain in joint, hand    Pain in joint, lower leg    Pain in joint, shoulder region    Pain in joint, shoulder region 04/23/2016   Pain in limb  Rectal bleeding 08/20/2016   Senile osteoporosis    Symptomatic menopausal or female climacteric states    Syncope and collapse    Unspecified hypertensive heart disease without heart failure 1995   Unspecified hypothyroidism    Unspecified polyarthropathy or polyarthritis, site unspecified    Unspecified urinary incontinence    Unspecified visual loss    Xerophthalmia    Past Surgical History:  Procedure Laterality Date   ABDOMINAL HYSTERECTOMY     BILATERAL OOPHORECTOMY  1981   BREAST RECONSTRUCTION Left 01/2000   BREAST REDUCTION SURGERY Right 01/2000   CATARACT EXTRACTION Right  09/04/2005   COLONOSCOPY  01/2004   MASTECTOMY Left 10/1999   THYROID LOBECTOMY Left 08/14/2016   Procedure: NEAR TOTAL THYROIDECTOMY;  Surgeon: Johnathan Hausen, MD;  Location: [facility];  Service: General;  Laterality: Left;   TOTAL ABDOMINAL HYSTERECTOMY W/ BILATERAL SALPINGOOPHORECTOMY  1981   TOTAL KNEE ARTHROPLASTY Right 2014    Allergies  Allergen Reactions   Donepezil Hcl     Body pain and stiffness    Namenda [Memantine Hcl]      Caused body pain    Sulfa Antibiotics    Sulfamethoxazole Rash    Allergies as of 02/14/2022       Reactions   Donepezil Hcl    Body pain and stiffness   Namenda [memantine Hcl]     Caused body pain    Sulfa Antibiotics    Sulfamethoxazole Rash        Medication List        Accurate as of February 14, 2022 11:59 PM. If you have any questions, ask your nurse or doctor.          STOP taking these medications    CALCIUM 600-D PO       TAKE these medications    amLODipine 2.5 MG tablet Commonly known as: NORVASC TAKE 1 TABLET BY MOUTH ONCE DAILY TO LOWER BLOOD PRESSURE   amLODipine 5 MG tablet Commonly known as: NORVASC TAKE 1 TABLET BY MOUTH ONCE DAILY TAKE WITH THE 2.'5MG'$  FOR A TOTAL OF 7.'5MG'$ S   CENTRUM SILVER PO Take 1 tablet by mouth daily.   Cholecalciferol 125 MCG (5000 UT) capsule Take 5,000 Units by mouth daily.   levothyroxine 112 MCG tablet Commonly known as: SYNTHROID Take 1 tablet (112 mcg total) by mouth daily before breakfast.   losartan-hydrochlorothiazide 100-12.5 MG tablet Commonly known as: HYZAAR Take 1 tablet by mouth once daily   OMEGA-3 CF PO Take 520 mg by mouth daily.   oxybutynin 15 MG 24 hr tablet Commonly known as: DITROPAN XL Take 15 mg by mouth daily.   Polyethyl Glycol-Propyl Glycol 0.4-0.3 % Soln Place 2 drops into both eyes daily as needed (dry eyes).   PREVAGEN PO Take 1 tablet by mouth daily. Regular strength 1 after breakfast   rivastigmine 4.6 mg/24hr Commonly known as:  EXELON Place 1 patch (4.6 mg total) onto the skin daily.   sennosides-docusate sodium 8.6-50 MG tablet Commonly known as: SENOKOT-S Take 1 tablet by mouth daily.   triamcinolone 0.025 % cream Commonly known as: KENALOG Apply 1 application. topically 2 (two) times daily.   zinc oxide 20 % ointment Apply 1 application. topically as needed for irritation.        Review of Systems  Constitutional:  Negative for activity change and appetite change.  HENT: Negative.    Respiratory:  Negative for cough and shortness of breath.   Cardiovascular:  Negative for leg swelling.  Gastrointestinal:  Negative for constipation.  Genitourinary:  Positive for frequency and urgency.  Musculoskeletal:  Positive for gait problem. Negative for arthralgias and myalgias.  Skin:  Positive for rash.  Neurological:  Negative for dizziness and weakness.  Psychiatric/Behavioral:  Positive for confusion. Negative for dysphoric mood and sleep disturbance.    Immunization History  Administered Date(s) Administered   Influenza, High Dose Seasonal PF 09/17/2017, 09/10/2018, 09/22/2019, 09/13/2021   Influenza, Quadrivalent, Recombinant, Inj, Pf 08/29/2020   Influenza,inj,Quad PF,6+ Mos 09/10/2018   Influenza-Unspecified 08/10/2013, 09/07/2015, 09/13/2016   Moderna SARS-COV2 Booster Vaccination 05/16/2021   Moderna Sars-Covid-2 Vaccination 12/13/2019, 01/10/2020, 10/23/2020   PFIZER(Purple Top)SARS-COV-2 Vaccination 08/29/2021   Pneumococcal Conjugate-13 06/08/2017, 11/02/2018   Pneumococcal Polysaccharide-23 12/09/2002, 06/08/2017, 10/22/2017, 11/02/2018   Pneumococcal-Unspecified 06/08/2017, 11/02/2018   Tdap 02/21/2014, 10/29/2017   Zoster Recombinat (Shingrix) 07/18/2017, 01/10/2018   Zoster, Live 07/18/2017, 01/10/2018   Pertinent  Health Maintenance Due  Topic Date Due   INFLUENZA VACCINE  Completed   DEXA SCAN  Completed   Fall Risk 08/10/2020 09/06/2020 09/06/2020 03/07/2021 09/05/2021  Falls in the  past year? - 0 0 0 0  Was there an injury with Fall? - - - - -  Fall Risk Category Calculator - - - - -  Fall Risk Category - - - - -  Patient Fall Risk Level Low fall risk - - - Low fall risk  Fall risk Follow up - - - - Falls evaluation completed   Functional Status Survey:    Vitals:   02/15/22 0948  BP: (!) 152/89  Pulse: 76  Resp: 16  Temp: 97.6 F (36.4 C)  SpO2: 94%  Weight: 163 lb (73.9 kg)  Height: '5\' 4"'$  (1.626 m)   Body mass index is 27.98 kg/m. Physical Exam Vitals reviewed.  Constitutional:      Appearance: Normal appearance.  HENT:     Head: Normocephalic.     Nose: Nose normal.     Mouth/Throat:     Mouth: Mucous membranes are moist.     Pharynx: Oropharynx is clear.  Eyes:     Pupils: Pupils are equal, round, and reactive to light.  Cardiovascular:     Rate and Rhythm: Normal rate and regular rhythm.     Pulses: Normal pulses.     Heart sounds: Normal heart sounds. No murmur heard. Pulmonary:     Effort: Pulmonary effort is normal.     Breath sounds: Normal breath sounds.  Abdominal:     General: Abdomen is flat. Bowel sounds are normal.     Palpations: Abdomen is soft.  Musculoskeletal:        General: No swelling.     Cervical back: Neck supple.  Skin:    General: Skin is warm.     Comments: has circular Macular rash in her Back where there was Exelon Patch  Neurological:     General: No focal deficit present.     Mental Status: She is alert.  Psychiatric:        Mood and Affect: Mood normal.        Thought Content: Thought content normal.    Labs reviewed: Recent Labs    02/28/21 0849 08/30/21 0805  NA 133* 134*  K 4.4 4.2  CL 98 99  CO2 23 26  GLUCOSE 89 95  BUN 15 14  CREATININE 0.70 0.69  CALCIUM 9.5 9.8   Recent Labs    02/28/21 0849 08/30/21 0805  AST 15 16  ALT 12 14  BILITOT  0.3 0.4  PROT 6.6 6.8   Recent Labs    02/28/21 0849 08/30/21 0805 09/17/21 1615  WBC 6.6 6.2 10.2  NEUTROABS 3,907 3,559 7,405   HGB 13.0 13.2 13.0  HCT 39.1 39.1 37.9  MCV 91.1 89.5 89.0  PLT 367 389 391   Lab Results  Component Value Date   TSH 2.30 02/28/2021   No results found for: HGBA1C Lab Results  Component Value Date   CHOL 190 07/26/2020   HDL 56 07/26/2020   LDLCALC 109 (H) 07/26/2020   TRIG 139 07/26/2020   CHOLHDL 3.4 07/26/2020    Significant Diagnostic Results in last 30 days:  No results found.  Assessment/Plan 1. Late onset Alzheimer's 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 ([place]) Failed Aricept and Namenda due to Side effects Now developed rash due to Exelon Patch Plan to stay in [place] for supervision with Meds and Showers  2. Essential hypertension On Norvasc and Hyzaar  3. Mixed stress and urge urinary incontinence Doing well with Ditropan  4. Hyperlipidemia, unspecified hyperlipidemia type Did not tolerate statins  5. Hypothyroidism, unspecified type Repeat TSH  6. Rash Triamcinolone Cream till resolve 7 Osteopenia DEXA done in 10/22 8Hyponatremia Sodium is stable  Family/ staff Communication:   Labs/tests ordered:  CBC,CMP,TSH

## 2022-02-18 ENCOUNTER — Encounter: Payer: Self-pay | Admitting: Orthopedic Surgery

## 2022-02-18 ENCOUNTER — Non-Acute Institutional Stay: Payer: PPO | Admitting: Orthopedic Surgery

## 2022-02-18 DIAGNOSIS — U071 COVID-19: Secondary | ICD-10-CM | POA: Diagnosis not present

## 2022-02-18 DIAGNOSIS — M6281 Muscle weakness (generalized): Secondary | ICD-10-CM | POA: Diagnosis not present

## 2022-02-18 DIAGNOSIS — Z9181 History of falling: Secondary | ICD-10-CM | POA: Diagnosis not present

## 2022-02-18 DIAGNOSIS — M79651 Pain in right thigh: Secondary | ICD-10-CM | POA: Diagnosis not present

## 2022-02-18 DIAGNOSIS — Z0000 Encounter for general adult medical examination without abnormal findings: Secondary | ICD-10-CM

## 2022-02-18 DIAGNOSIS — R2681 Unsteadiness on feet: Secondary | ICD-10-CM | POA: Diagnosis not present

## 2022-02-18 NOTE — Patient Instructions (Signed)
?  Ms. Hinson , ?Thank you for taking time to come for your Medicare Wellness Visit. I appreciate your ongoing commitment to your health goals. Please review the following plan we discussed and let me know if I can assist you in the future.  ? ?These are the goals we discussed: ? Goals   ? ?  Increase physical activity   ?  Maintain LIfestyle   ?  Starting today pt will maintain lifestyle. ? ?  ?  Maintain Mobility and Function   ?  Evidence-based guidance:  ?Acknowledge and validate impact of pain, loss of strength and potential disfigurement (hand osteoarthritis) on mental health and daily life, such as social isolation, anxiety, depression, impaired sexual relationship and   ?injury from falls.  ?Anticipate referral to physical or occupational therapy for assessment, therapeutic exercise and recommendation for adaptive equipment or assistive devices; encourage participation.  ?Assess impact on ability to perform activities of daily living, as well as engage in sports and leisure events or requirements of work or school.  ?Provide anticipatory guidance and reassurance about the benefit of exercise to maintain function; acknowledge and normalize fear that exercise may worsen symptoms.  ?Encourage regular exercise, at least 10 minutes at a time for 45 minutes per week; consider yoga, water exercise and proprioceptive exercises; encourage use of wearable activity tracker to increase motivation and adherence.  ?Encourage maintenance or resumption of daily activities, including employment, as pain allows and with minimal exposure to trauma.  ?Assist patient to advocate for adaptations to the work environment.  ?Consider level of pain and function, gender, age, lifestyle, patient preference, quality of life, readiness and ???capacity to benefit??? when recommending patients for orthopaedic surgery consultation.  ?Explore strategies, such as changes to medication regimen or activity that enables patient to anticipate and  manage flare-ups that increase deconditioning and disability.  ?Explore patient preferences; encourage exposure to a broader range of activities that have been avoided for fear of experiencing pain.  ?Identify barriers to participation in therapy or exercise, such as pain with activity, anticipated or imagined pain.  ?Monitor postoperative joint replacement or any preexisting joint replacement for ongoing pain and loss of function; provide social support and encouragement throughout recovery.   ?Notes:  ?  ? ?  ?  ?This is a list of the screening recommended for you and due dates:  ?Health Maintenance  ?Topic Date Due  ? COVID-19 Vaccine (5 - Booster for Moderna series) 10/29/2022*  ? Tetanus Vaccine  10/30/2027  ? Pneumonia Vaccine  Completed  ? Flu Shot  Completed  ? DEXA scan (bone density measurement)  Completed  ? Zoster (Shingles) Vaccine  Completed  ? HPV Vaccine  Aged Out  ?*Topic was postponed. The date shown is not the original due date.  ? ? ?

## 2022-02-18 NOTE — Progress Notes (Signed)
This encounter was created in error - please disregard.

## 2022-02-18 NOTE — Progress Notes (Signed)
Subjective:   Frances Holland is a 88 y.o. female who presents for Medicare Annual (Subsequent) preventive examination.  Review of Systems     Cardiac Risk Factors include: advanced age (>25mn, >>36women);sedentary lifestyle;hypertension     Objective:    Today's Vitals   02/18/22 1328  BP: (!) 152/89  Pulse: 76  Resp: 16  Temp: (!) 97.3 F (36.3 C)  SpO2: 95%  Weight: 163 lb (73.9 kg)  Height: '5\' 4"'$  (1.626 m)   Body mass index is 27.98 kg/m.  Advanced Directives 02/18/2022 02/15/2022 09/05/2021 08/10/2020 04/11/2020 03/24/2019 02/26/2019  Does Patient Have a Medical Advance Directive? Yes Yes Yes No Yes Yes Yes  Type of AParamedicof ACumberlandLiving will HMasuryLiving will H[place]invilleLiving will - H[place]avenworthLiving will HM[place]ughOut of facility DNR (pink MOST or yellow form) Healthcare Power of Attorney  Does patient want to make changes to medical advance directive? No - Patient declined No - Patient declined No - Patient declined - No - Patient declined No - Patient declined No - Patient declined  Copy of H[place]in Chart? Yes - validated most recent copy scanned in chart (See row information) Yes - validated most recent copy scanned in chart (See row information) Yes - validated most recent copy scanned in chart (See row information) - Yes - validated most recent copy scanned in chart (See row information) - Yes - validated most recent copy scanned in chart (See row information)  Pre-existing out of facility DNR order (yellow form or pink MOST form) - - Pink MOST form placed in chart (order not valid for inpatient use) - - Pink MOST form placed in chart (order not valid for inpatient use) -    Current Medications (verified) Outpatient Encounter Medications as of 02/18/2022  Medication Sig   Apoaequorin (PREVAGEN PO) Take 1 tablet by mouth daily. Regular strength  1 after breakfast   Cholecalciferol 5000 units capsule Take 5,000 Units by mouth daily.   losartan-hydrochlorothiazide (HYZAAR) 100-12.5 MG tablet Take 1 tablet by mouth once daily   Multiple Vitamins-Minerals (CENTRUM SILVER PO) Take 1 tablet by mouth daily.    Omega-3 Fatty Acids (OMEGA-3 CF PO) Take 520 mg by mouth daily.    oxybutynin (DITROPAN XL) 15 MG 24 hr tablet Take 15 mg by mouth daily.   sennosides-docusate sodium (SENOKOT-S) 8.6-50 MG tablet Take 1 tablet by mouth daily.   triamcinolone (KENALOG) 0.025 % cream Apply 1 application. topically 2 (two) times daily.   zinc oxide 20 % ointment Apply 1 application. topically as needed for irritation.   amLODipine (NORVASC) 2.5 MG tablet TAKE 1 TABLET BY MOUTH ONCE DAILY TO LOWER BLOOD PRESSURE   amLODipine (NORVASC) 5 MG tablet TAKE 1 TABLET BY MOUTH ONCE DAILY TAKE WITH THE 2.'5MG'$  FOR A TOTAL OF 7.'5MG'$ S   levothyroxine (SYNTHROID) 112 MCG tablet Take 1 tablet (112 mcg total) by mouth daily before breakfast.   Polyethyl Glycol-Propyl Glycol 0.4-0.3 % SOLN Place 2 drops into both eyes daily as needed (dry eyes).    No facility-administered encounter medications on file as of 02/18/2022.    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   Hypertension    Insomnia    Knee pain, bilateral 2014   Lymphedema    Left arm following mastectomy   Memory impairment    Memory loss    Migraine 05/16/2012   Migraine without aura, without mention of intractable migraine without mention of status migrainosus    patient denies at preop  of 08/08/16    Other and unspecified hyperlipidemia    Other bursitis disorders    Other infective bursitis, left shoulder    Pain in joint, ankle and foot    Pain in joint, hand    Pain in joint, lower leg    Pain in joint, shoulder region    Pain in joint, shoulder region 04/23/2016   Pain in limb    Rectal bleeding 08/20/2016   Senile osteoporosis    Symptomatic menopausal or female climacteric states    Syncope and collapse    Unspecified hypertensive heart disease without heart failure 1995   Unspecified hypothyroidism    Unspecified polyarthropathy or polyarthritis, site unspecified    Unspecified urinary incontinence    Unspecified visual loss    Xerophthalmia    Past Surgical History:  Procedure Laterality Date   ABDOMINAL HYSTERECTOMY     BILATERAL OOPHORECTOMY  1981   BREAST RECONSTRUCTION Left 01/2000   BREAST REDUCTION SURGERY Right 01/2000   CATARACT EXTRACTION Right 09/04/2005   COLONOSCOPY  01/2004   MASTECTOMY Left 10/1999   THYROID LOBECTOMY Left 08/14/2016   Procedure: NEAR TOTAL THYROIDECTOMY;  Surgeon: Johnathan Hausen, MD;  Location: [facility];  Service: General;  Laterality: Left;   TOTAL ABDOMINAL HYSTERECTOMY W/ BILATERAL SALPINGOOPHORECTOMY  1981   TOTAL KNEE ARTHROPLASTY Right 2014   Family History  Problem Relation Age of Onset   Heart disease Mother    Dementia Mother    Dementia Father    Cancer Daughter    Asthma Son    Social History   Socioeconomic History   Marital status: Married    Spouse name: Not on file   Number of children: 3   Years of education: 14   Highest education level: Not on file  Occupational History   Occupation: retired Estate agent   Tobacco Use   Smoking status: Never   Smokeless tobacco: Never  Vaping Use   Vaping Use: Never used  Substance and Sexual Activity   Alcohol use: No   Drug use: No   Sexual activity: Never    Comment: 1st intercourse 88 yo-Fewer than 5 partners  Other Topics Concern   Not on file   Social History Narrative   L[facility] at [facility] since 07/07/2014   Married -[name]   Never smoked   Right handed     Alcohol none   Caffeine 2 cups of coffee daily   Exercise walks daily, exercise class half hour 2-3 times a week   Living will,  POA   Patient believes that her memory is OK except for recall of names. Her husband is concerned about it. She scored 29/30 on MMSE in Dec 2017. They both have concerns about the future if he precedes her in death, but he has written down how he keeps accounts and has scheduled an appt with an accountant to do their taxes. They are a 2nd  marriage and have his and her families although the "children" get along. They have been married 30 years.   Social Determinants of Health   Financial Resource Strain: Low Risk    Difficulty of Paying Living Expenses: Not hard at all  Food Insecurity: No Food Insecurity   Worried About Charity fundraiser in the Last Year: Never true   Real in the Last Year: Never true  Transportation Needs: No Transportation Needs   Lack of Transportation (Medical): No   Lack of Transportation (Non-Medical): No  Physical Activity: Insufficiently Active   Days of Exercise per Week: 7 days   Minutes of Exercise per Session: 10 min  Stress: No Stress Concern Present   Feeling of Stress : Not at all  Social Connections: Moderately Integrated   Frequency of Communication with Friends and Family: More than three times a week   Frequency of Social Gatherings with Friends and Family: More than three times a week   Attends Religious Services: More than 4 times per year   Active Member of [employer] or Organizations: No   Attends Music therapist: Never   Marital Status: Married    Tobacco Counseling Counseling given: Not Answered   Clinical Intake:  Pre-visit preparation completed: Yes  Pain : No/denies pain     BMI - recorded: 27.98 Nutritional Status: BMI 25 -29 Overweight Nutritional  Risks: None Diabetes: No  How often do you need to have someone help you when you read instructions, pamphlets, or other written materials from your doctor or pharmacy?: 4 - Often What is the last grade level you completed in school?: 2 years college  Diabetic?No  Interpreter Needed?: No      Activities of Daily Living In your present state of health, do you have any difficulty performing the following activities: 02/18/2022  Hearing? N  Vision? N  Difficulty concentrating or making decisions? Y  Walking or climbing stairs? Y  Dressing or bathing? Y  Doing errands, shopping? Y  Preparing Food and eating ? Y  Using the Toilet? N  In the past six months, have you accidently leaked urine? Y  Do you have problems with loss of bowel control? N  Managing your Medications? Y  Managing your Finances? Y  Housekeeping or managing your Housekeeping? Y  Some recent data might be hidden    Patient Care Team: Virgie Dad, MD as PCP - General (Internal Medicine) Reynold Bowen, MD as Consulting Physician (Endocrinology)  Indicate any recent Medical Services you may have received from other than [facility] providers in the past year (date may be approximate).     Assessment:   This is a routine wellness examination for [place].  Hearing/Vision screen No results found.  Dietary issues and exercise activities discussed: Current Exercise Habits: The patient does not participate in regular exercise at present, Exercise limited by: neurologic condition(s);orthopedic condition(s);cardiac condition(s)   Goals Addressed             This Visit's Progress    Maintain LIfestyle   On track    Starting today pt will maintain lifestyle.      Maintain Mobility and Function   On track    Evidence-based guidance:  Acknowledge and validate impact of pain, loss of strength and potential disfigurement (hand osteoarthritis) on mental health and daily life, such as social isolation, anxiety,  depression, impaired sexual relationship and   injury from falls.  Anticipate referral to physical or occupational therapy for  assessment, therapeutic exercise and recommendation for adaptive equipment or assistive devices; encourage participation.  Assess impact on ability to perform activities of daily living, as well as engage in sports and leisure events or requirements of work or school.  Provide anticipatory guidance and reassurance about the benefit of exercise to maintain function; acknowledge and normalize fear that exercise may worsen symptoms.  Encourage regular exercise, at least 10 minutes at a time for 45 minutes per week; consider yoga, water exercise and proprioceptive exercises; encourage use of wearable activity tracker to increase motivation and adherence.  Encourage maintenance or resumption of daily activities, including employment, as pain allows and with minimal exposure to trauma.  Assist patient to advocate for adaptations to the work environment.  Consider level of pain and function, gender, age, lifestyle, patient preference, quality of life, readiness and capacity to benefit when recommending patients for orthopaedic surgery consultation.  Explore strategies, such as changes to medication regimen or activity that enables patient to anticipate and manage flare-ups that increase deconditioning and disability.  Explore patient preferences; encourage exposure to a broader range of activities that have been avoided for fear of experiencing pain.  Identify barriers to participation in therapy or exercise, such as pain with activity, anticipated or imagined pain.  Monitor postoperative joint replacement or any preexisting joint replacement for ongoing pain and loss of function; provide social support and encouragement throughout recovery.   Notes:        Depression Screen PHQ 2/9 Scores 02/18/2022 04/11/2020 10/30/2018 07/01/2018 10/10/2017 08/13/2017 05/14/2016  PHQ - 2 Score 0  0 0 0 0 0 0  PHQ- 9 Score - 5 - 6 - 0 -    Fall Risk Fall Risk  02/18/2022 09/05/2021 03/07/2021 09/06/2020 09/06/2020  Falls in the past year? - 0 0 0 0  Number falls in past yr: 0 0 0 0 -  Injury with Fall? 0 - - - -  Risk for fall due to : History of fall(s);Impaired balance/gait;Impaired mobility - - - -  Follow up Falls evaluation completed;Education provided;Falls prevention discussed Falls evaluation completed - - -    FALL RISK PREVENTION PERTAINING TO THE HOME:  Any stairs in or around the home? No  If so, are there any without handrails? No  Home free of loose throw rugs in walkways, pet beds, electrical cords, etc? Yes  Adequate lighting in your home to reduce risk of falls? Yes   ASSISTIVE DEVICES UTILIZED TO PREVENT FALLS:  Life alert? No  Use of a cane, walker or w/c? Yes  Grab bars in the bathroom? Yes  Shower chair or bench in shower? Yes  Elevated toilet seat or a handicapped toilet? Yes   TIMED UP AND GO:  Was the test performed? No .  Length of time to ambulate 10 feet: N/A sec.   Gait slow and steady with assistive device  Cognitive Function: MMSE - Mini Mental State Exam 02/18/2022 09/05/2021 04/11/2020 04/04/2020 10/04/2019  Not completed: Unable to complete - - - (No Data)  Orientation to time - '3 3 5 5  '$ Orientation to Place - '4 4 3 2  '$ Registration - '3 3 3 3  '$ Attention/ Calculation - '1 3 4 4  '$ Recall - 3 2 0 1  Language- name 2 objects - '2 2 2 2  '$ Language- repeat - '1 1 1 1  '$ Language- follow 3 step command - '3 3 3 3  '$ Language- read & follow direction - '1 1 1 1  '$ Write  a sentence - '1 1 1 1  '$ Copy design - '1 1 1 1  '$ Total score - '23 24 24 24   '$ Montreal Cognitive Assessment  11/14/2021  Visuospatial/ Executive (0/5) 2  Naming (0/3) 2  Attention: Read list of digits (0/2) 1  Attention: Read list of letters (0/1) 0  Attention: Serial 7 subtraction starting at 100 (0/3) 0  Language: Repeat phrase (0/2) 0  Language : Fluency (0/1) 0  Abstraction (0/2) 2   Delayed Recall (0/5) 0  Orientation (0/6) 4  Total 11  Adjusted Score (based on education) 11      Immunizations Immunization History  Administered Date(s) Administered   Influenza, High Dose Seasonal PF 09/17/2017, 09/10/2018, 09/22/2019, 09/13/2021   Influenza, Quadrivalent, Recombinant, Inj, Pf 08/29/2020   Influenza,inj,Quad PF,6+ Mos 09/10/2018   Influenza-Unspecified 08/10/2013, 09/07/2015, 09/13/2016   Moderna SARS-COV2 Booster Vaccination 05/16/2021   Moderna Sars-Covid-2 Vaccination 12/13/2019, 01/10/2020, 10/23/2020   PFIZER(Purple Top)SARS-COV-2 Vaccination 08/29/2021   Pneumococcal Conjugate-13 06/08/2017, 11/02/2018   Pneumococcal Polysaccharide-23 12/09/2002, 06/08/2017, 10/22/2017, 11/02/2018   Pneumococcal-Unspecified 06/08/2017, 11/02/2018   Tdap 02/21/2014, 10/29/2017   Zoster Recombinat (Shingrix) 07/18/2017, 01/10/2018   Zoster, Live 07/18/2017, 01/10/2018    TDAP status: Up to date  Flu Vaccine status: Up to date  Pneumococcal vaccine status: Up to date  Covid-19 vaccine status: Completed vaccines  Qualifies for Shingles Vaccine? Yes   Zostavax completed No   Shingrix Completed?: Yes  Screening Tests Health Maintenance  Topic Date Due   COVID-19 Vaccine (5 - Booster for Moderna series) 10/29/2022 (Originally 10/24/2021)   TETANUS/TDAP  10/30/2027   Pneumonia Vaccine 54+ Years old  Completed   INFLUENZA VACCINE  Completed   DEXA SCAN  Completed   Zoster Vaccines- Shingrix  Completed   HPV VACCINES  Aged Out    Health Maintenance  There are no preventive care reminders to display for this patient.  Colorectal cancer screening: No longer required.   Mammogram status: Completed 09/2021. Repeat every year  Bone Density status: Completed 09/2021. Results reflect: Bone density results: OSTEOPENIA. Repeat every 2 years.  Lung Cancer Screening: (Low Dose CT Chest recommended if Age 44-80 years, 30 pack-year currently smoking OR have quit w/in  15years.) does not qualify.   Lung Cancer Screening Referral: No  Additional Screening:  Hepatitis C Screening: does not qualify; advanced age  Vision Screening: Recommended annual ophthalmology exams for early detection of glaucoma and other disorders of the eye. Is the patient up to date with their annual eye exam?  No  Who is the provider or what is the name of the office in which the patient attends annual eye exams? N/A If pt is not established with a provider, would they like to be referred to a provider to establish care? No .   Dental Screening: Recommended annual dental exams for proper oral hygiene  Community Resource Referral / Chronic Care Management: CRR required this visit?  No   CCM required this visit?  No      Plan:     I have personally reviewed and noted the following in the patients chart:   Medical and social history Use of alcohol, tobacco or illicit drugs  Current medications and supplements including opioid prescriptions.  Functional ability and status Nutritional status Physical activity Advanced directives List of other physicians Hospitalizations, surgeries, and ER visits in previous 12 months Vitals Screenings to include cognitive, depression, and falls Referrals and appointments  In addition, I have reviewed and discussed with patient certain  preventive protocols, quality metrics, and best practice recommendations. A written personalized care plan for preventive services as well as general preventive health recommendations were provided to patient.     Yvonna Alanis, NP   02/18/2022

## 2022-02-19 NOTE — Progress Notes (Signed)
This encounter was created in error - please disregard.

## 2022-02-20 NOTE — Progress Notes (Signed)
Location of patient (ex: home, work):  Assisted Living ? ?Patient consents to a telephone visit:  Yes see telephone/video visit consent dated ? ?Location of the provider (ex: office, home): [facility]  ?Name of any referring provider:  n/a ? ?Names of all persons participating in the telemedicine service and their role in the encounter:  Bing Matter, CMA, Amy E.Fargo, NP and patient.  ? ? ? ?

## 2022-02-21 DIAGNOSIS — R2681 Unsteadiness on feet: Secondary | ICD-10-CM | POA: Diagnosis not present

## 2022-02-21 DIAGNOSIS — M79651 Pain in right thigh: Secondary | ICD-10-CM | POA: Diagnosis not present

## 2022-02-21 DIAGNOSIS — U071 COVID-19: Secondary | ICD-10-CM | POA: Diagnosis not present

## 2022-02-21 DIAGNOSIS — M6281 Muscle weakness (generalized): Secondary | ICD-10-CM | POA: Diagnosis not present

## 2022-02-21 DIAGNOSIS — Z9181 History of falling: Secondary | ICD-10-CM | POA: Diagnosis not present

## 2022-02-25 DIAGNOSIS — M6281 Muscle weakness (generalized): Secondary | ICD-10-CM | POA: Diagnosis not present

## 2022-02-25 DIAGNOSIS — U071 COVID-19: Secondary | ICD-10-CM | POA: Diagnosis not present

## 2022-02-25 DIAGNOSIS — M79651 Pain in right thigh: Secondary | ICD-10-CM | POA: Diagnosis not present

## 2022-02-25 DIAGNOSIS — Z9181 History of falling: Secondary | ICD-10-CM | POA: Diagnosis not present

## 2022-02-25 DIAGNOSIS — R2681 Unsteadiness on feet: Secondary | ICD-10-CM | POA: Diagnosis not present

## 2022-02-28 DIAGNOSIS — Z9181 History of falling: Secondary | ICD-10-CM | POA: Diagnosis not present

## 2022-02-28 DIAGNOSIS — M79651 Pain in right thigh: Secondary | ICD-10-CM | POA: Diagnosis not present

## 2022-02-28 DIAGNOSIS — M6281 Muscle weakness (generalized): Secondary | ICD-10-CM | POA: Diagnosis not present

## 2022-02-28 DIAGNOSIS — R2681 Unsteadiness on feet: Secondary | ICD-10-CM | POA: Diagnosis not present

## 2022-02-28 DIAGNOSIS — U071 COVID-19: Secondary | ICD-10-CM | POA: Diagnosis not present

## 2022-03-04 DIAGNOSIS — R2681 Unsteadiness on feet: Secondary | ICD-10-CM | POA: Diagnosis not present

## 2022-03-04 DIAGNOSIS — Z9181 History of falling: Secondary | ICD-10-CM | POA: Diagnosis not present

## 2022-03-04 DIAGNOSIS — M6281 Muscle weakness (generalized): Secondary | ICD-10-CM | POA: Diagnosis not present

## 2022-03-04 DIAGNOSIS — U071 COVID-19: Secondary | ICD-10-CM | POA: Diagnosis not present

## 2022-03-04 DIAGNOSIS — M79651 Pain in right thigh: Secondary | ICD-10-CM | POA: Diagnosis not present

## 2022-03-06 ENCOUNTER — Encounter: Payer: PPO | Admitting: Internal Medicine

## 2022-03-07 DIAGNOSIS — M79651 Pain in right thigh: Secondary | ICD-10-CM | POA: Diagnosis not present

## 2022-03-07 DIAGNOSIS — Z9181 History of falling: Secondary | ICD-10-CM | POA: Diagnosis not present

## 2022-03-07 DIAGNOSIS — U071 COVID-19: Secondary | ICD-10-CM | POA: Diagnosis not present

## 2022-03-07 DIAGNOSIS — M6281 Muscle weakness (generalized): Secondary | ICD-10-CM | POA: Diagnosis not present

## 2022-03-07 DIAGNOSIS — R2681 Unsteadiness on feet: Secondary | ICD-10-CM | POA: Diagnosis not present

## 2022-03-11 DIAGNOSIS — M79651 Pain in right thigh: Secondary | ICD-10-CM | POA: Diagnosis not present

## 2022-03-11 DIAGNOSIS — U071 COVID-19: Secondary | ICD-10-CM | POA: Diagnosis not present

## 2022-03-11 DIAGNOSIS — Z9181 History of falling: Secondary | ICD-10-CM | POA: Diagnosis not present

## 2022-03-11 DIAGNOSIS — R2681 Unsteadiness on feet: Secondary | ICD-10-CM | POA: Diagnosis not present

## 2022-03-11 DIAGNOSIS — M6281 Muscle weakness (generalized): Secondary | ICD-10-CM | POA: Diagnosis not present

## 2022-03-12 ENCOUNTER — Telehealth: Payer: Self-pay | Admitting: Internal Medicine

## 2022-03-12 NOTE — Telephone Encounter (Signed)
We will send her to another Gyn if needed ?

## 2022-03-12 NOTE — Telephone Encounter (Signed)
Pts spouse(Gordon) left message on referral line concerned that Ms Jalayia gyn will be leaving the practice & he is not sure if she still has a pessary in her vagina & wants to know who they will see & can they get an update on this? ? ?Please advise ?Lattie Haw ?

## 2022-03-12 NOTE — Telephone Encounter (Signed)
[name], husband called and stated that Dr. Kenton Kingfisher is leaving [facility].  ?Husband is requesting a referral to be placed for a local OB/GYN to have patient checked.  ? ?Requesting referral.  ?Please Advise.  ?

## 2022-03-13 ENCOUNTER — Encounter: Payer: PPO | Admitting: Internal Medicine

## 2022-03-14 DIAGNOSIS — M79651 Pain in right thigh: Secondary | ICD-10-CM | POA: Diagnosis not present

## 2022-03-14 DIAGNOSIS — R2681 Unsteadiness on feet: Secondary | ICD-10-CM | POA: Diagnosis not present

## 2022-03-14 DIAGNOSIS — U071 COVID-19: Secondary | ICD-10-CM | POA: Diagnosis not present

## 2022-03-14 DIAGNOSIS — M6281 Muscle weakness (generalized): Secondary | ICD-10-CM | POA: Diagnosis not present

## 2022-03-14 DIAGNOSIS — Z9181 History of falling: Secondary | ICD-10-CM | POA: Diagnosis not present

## 2022-03-18 DIAGNOSIS — M79651 Pain in right thigh: Secondary | ICD-10-CM | POA: Diagnosis not present

## 2022-03-18 DIAGNOSIS — R2681 Unsteadiness on feet: Secondary | ICD-10-CM | POA: Diagnosis not present

## 2022-03-18 DIAGNOSIS — Z9181 History of falling: Secondary | ICD-10-CM | POA: Diagnosis not present

## 2022-03-18 DIAGNOSIS — U071 COVID-19: Secondary | ICD-10-CM | POA: Diagnosis not present

## 2022-03-18 DIAGNOSIS — M6281 Muscle weakness (generalized): Secondary | ICD-10-CM | POA: Diagnosis not present

## 2022-03-20 DIAGNOSIS — M79651 Pain in right thigh: Secondary | ICD-10-CM | POA: Diagnosis not present

## 2022-03-20 DIAGNOSIS — U071 COVID-19: Secondary | ICD-10-CM | POA: Diagnosis not present

## 2022-03-20 DIAGNOSIS — R2681 Unsteadiness on feet: Secondary | ICD-10-CM | POA: Diagnosis not present

## 2022-03-20 DIAGNOSIS — Z9181 History of falling: Secondary | ICD-10-CM | POA: Diagnosis not present

## 2022-03-20 DIAGNOSIS — M6281 Muscle weakness (generalized): Secondary | ICD-10-CM | POA: Diagnosis not present

## 2022-03-25 DIAGNOSIS — M6281 Muscle weakness (generalized): Secondary | ICD-10-CM | POA: Diagnosis not present

## 2022-03-25 DIAGNOSIS — U071 COVID-19: Secondary | ICD-10-CM | POA: Diagnosis not present

## 2022-03-25 DIAGNOSIS — R2681 Unsteadiness on feet: Secondary | ICD-10-CM | POA: Diagnosis not present

## 2022-03-25 DIAGNOSIS — Z9181 History of falling: Secondary | ICD-10-CM | POA: Diagnosis not present

## 2022-03-25 DIAGNOSIS — M79651 Pain in right thigh: Secondary | ICD-10-CM | POA: Diagnosis not present

## 2022-03-28 DIAGNOSIS — R2681 Unsteadiness on feet: Secondary | ICD-10-CM | POA: Diagnosis not present

## 2022-03-28 DIAGNOSIS — U071 COVID-19: Secondary | ICD-10-CM | POA: Diagnosis not present

## 2022-03-28 DIAGNOSIS — Z9181 History of falling: Secondary | ICD-10-CM | POA: Diagnosis not present

## 2022-03-28 DIAGNOSIS — M6281 Muscle weakness (generalized): Secondary | ICD-10-CM | POA: Diagnosis not present

## 2022-03-28 DIAGNOSIS — M79651 Pain in right thigh: Secondary | ICD-10-CM | POA: Diagnosis not present

## 2022-04-04 DIAGNOSIS — R2681 Unsteadiness on feet: Secondary | ICD-10-CM | POA: Diagnosis not present

## 2022-04-04 DIAGNOSIS — M6281 Muscle weakness (generalized): Secondary | ICD-10-CM | POA: Diagnosis not present

## 2022-04-04 DIAGNOSIS — U071 COVID-19: Secondary | ICD-10-CM | POA: Diagnosis not present

## 2022-04-04 DIAGNOSIS — Z9181 History of falling: Secondary | ICD-10-CM | POA: Diagnosis not present

## 2022-04-04 DIAGNOSIS — M79651 Pain in right thigh: Secondary | ICD-10-CM | POA: Diagnosis not present

## 2022-04-08 DIAGNOSIS — R2681 Unsteadiness on feet: Secondary | ICD-10-CM | POA: Diagnosis not present

## 2022-04-08 DIAGNOSIS — M79651 Pain in right thigh: Secondary | ICD-10-CM | POA: Diagnosis not present

## 2022-04-08 DIAGNOSIS — Z9181 History of falling: Secondary | ICD-10-CM | POA: Diagnosis not present

## 2022-04-08 DIAGNOSIS — M6281 Muscle weakness (generalized): Secondary | ICD-10-CM | POA: Diagnosis not present

## 2022-04-08 DIAGNOSIS — U071 COVID-19: Secondary | ICD-10-CM | POA: Diagnosis not present

## 2022-04-11 DIAGNOSIS — U071 COVID-19: Secondary | ICD-10-CM | POA: Diagnosis not present

## 2022-04-11 DIAGNOSIS — R2681 Unsteadiness on feet: Secondary | ICD-10-CM | POA: Diagnosis not present

## 2022-04-11 DIAGNOSIS — M6281 Muscle weakness (generalized): Secondary | ICD-10-CM | POA: Diagnosis not present

## 2022-04-11 DIAGNOSIS — Z9181 History of falling: Secondary | ICD-10-CM | POA: Diagnosis not present

## 2022-04-11 DIAGNOSIS — M79651 Pain in right thigh: Secondary | ICD-10-CM | POA: Diagnosis not present

## 2022-04-11 NOTE — Telephone Encounter (Signed)
I have talked to them in the facility ?

## 2022-04-15 DIAGNOSIS — U071 COVID-19: Secondary | ICD-10-CM | POA: Diagnosis not present

## 2022-04-15 DIAGNOSIS — M79651 Pain in right thigh: Secondary | ICD-10-CM | POA: Diagnosis not present

## 2022-04-15 DIAGNOSIS — M6281 Muscle weakness (generalized): Secondary | ICD-10-CM | POA: Diagnosis not present

## 2022-04-15 DIAGNOSIS — R2681 Unsteadiness on feet: Secondary | ICD-10-CM | POA: Diagnosis not present

## 2022-04-15 DIAGNOSIS — Z9181 History of falling: Secondary | ICD-10-CM | POA: Diagnosis not present

## 2022-04-17 ENCOUNTER — Telehealth: Payer: Self-pay | Admitting: *Deleted

## 2022-04-17 NOTE — Telephone Encounter (Signed)
Gordon,husband called and stated that he had a couple of concerns.  ? ?1.) Applied for [employer] and has been APPROVED for Chronic Illness and Cognitive Impairment. Husband is wanting to know if Appropriate if he can get a copy of the Insurance forms that were submitted.  ? ?2.) [facility] is recommending both of them to get the Bivalent Vaccine. Stated that they moved to [place] 01/30/2022 and both had tested Positive for Covid with No symptoms and was Quarantined for 10 days. He is wondering if You Recommend the Bivalent Vaccine for the both of them.  ? ?Please Advise.  ?

## 2022-04-18 DIAGNOSIS — U071 COVID-19: Secondary | ICD-10-CM | POA: Diagnosis not present

## 2022-04-18 DIAGNOSIS — Z9181 History of falling: Secondary | ICD-10-CM | POA: Diagnosis not present

## 2022-04-18 DIAGNOSIS — M79651 Pain in right thigh: Secondary | ICD-10-CM | POA: Diagnosis not present

## 2022-04-18 DIAGNOSIS — R2681 Unsteadiness on feet: Secondary | ICD-10-CM | POA: Diagnosis not present

## 2022-04-18 DIAGNOSIS — M6281 Muscle weakness (generalized): Secondary | ICD-10-CM | POA: Diagnosis not present

## 2022-04-18 NOTE — Telephone Encounter (Signed)
Tried calling Husband, [name]. Mailbox is full and cannot leave a message. Will try again.  ?

## 2022-04-18 NOTE — Telephone Encounter (Signed)
I do not know where those forms are. ?They can have Bivalent dose of Covid after 05/22 90 days after their Covid infection. ? ?

## 2022-04-19 NOTE — Telephone Encounter (Signed)
Husband notified  

## 2022-04-22 DIAGNOSIS — M6281 Muscle weakness (generalized): Secondary | ICD-10-CM | POA: Diagnosis not present

## 2022-04-22 DIAGNOSIS — Z9181 History of falling: Secondary | ICD-10-CM | POA: Diagnosis not present

## 2022-04-22 DIAGNOSIS — R2681 Unsteadiness on feet: Secondary | ICD-10-CM | POA: Diagnosis not present

## 2022-04-22 DIAGNOSIS — U071 COVID-19: Secondary | ICD-10-CM | POA: Diagnosis not present

## 2022-04-22 DIAGNOSIS — M79651 Pain in right thigh: Secondary | ICD-10-CM | POA: Diagnosis not present

## 2022-04-25 DIAGNOSIS — M6281 Muscle weakness (generalized): Secondary | ICD-10-CM | POA: Diagnosis not present

## 2022-04-25 DIAGNOSIS — U071 COVID-19: Secondary | ICD-10-CM | POA: Diagnosis not present

## 2022-04-25 DIAGNOSIS — Z9181 History of falling: Secondary | ICD-10-CM | POA: Diagnosis not present

## 2022-04-25 DIAGNOSIS — M79651 Pain in right thigh: Secondary | ICD-10-CM | POA: Diagnosis not present

## 2022-04-25 DIAGNOSIS — R2681 Unsteadiness on feet: Secondary | ICD-10-CM | POA: Diagnosis not present

## 2022-04-29 DIAGNOSIS — Z9181 History of falling: Secondary | ICD-10-CM | POA: Diagnosis not present

## 2022-04-29 DIAGNOSIS — R2681 Unsteadiness on feet: Secondary | ICD-10-CM | POA: Diagnosis not present

## 2022-04-29 DIAGNOSIS — M79651 Pain in right thigh: Secondary | ICD-10-CM | POA: Diagnosis not present

## 2022-04-29 DIAGNOSIS — U071 COVID-19: Secondary | ICD-10-CM | POA: Diagnosis not present

## 2022-04-29 DIAGNOSIS — M6281 Muscle weakness (generalized): Secondary | ICD-10-CM | POA: Diagnosis not present

## 2022-05-02 DIAGNOSIS — M6281 Muscle weakness (generalized): Secondary | ICD-10-CM | POA: Diagnosis not present

## 2022-05-02 DIAGNOSIS — R2681 Unsteadiness on feet: Secondary | ICD-10-CM | POA: Diagnosis not present

## 2022-05-02 DIAGNOSIS — M79651 Pain in right thigh: Secondary | ICD-10-CM | POA: Diagnosis not present

## 2022-05-02 DIAGNOSIS — U071 COVID-19: Secondary | ICD-10-CM | POA: Diagnosis not present

## 2022-05-02 DIAGNOSIS — Z9181 History of falling: Secondary | ICD-10-CM | POA: Diagnosis not present

## 2022-05-06 DIAGNOSIS — U071 COVID-19: Secondary | ICD-10-CM | POA: Diagnosis not present

## 2022-05-06 DIAGNOSIS — M6281 Muscle weakness (generalized): Secondary | ICD-10-CM | POA: Diagnosis not present

## 2022-05-06 DIAGNOSIS — Z9181 History of falling: Secondary | ICD-10-CM | POA: Diagnosis not present

## 2022-05-06 DIAGNOSIS — M79651 Pain in right thigh: Secondary | ICD-10-CM | POA: Diagnosis not present

## 2022-05-06 DIAGNOSIS — R2681 Unsteadiness on feet: Secondary | ICD-10-CM | POA: Diagnosis not present

## 2022-05-09 DIAGNOSIS — M6281 Muscle weakness (generalized): Secondary | ICD-10-CM | POA: Diagnosis not present

## 2022-05-09 DIAGNOSIS — Z9181 History of falling: Secondary | ICD-10-CM | POA: Diagnosis not present

## 2022-05-09 DIAGNOSIS — U071 COVID-19: Secondary | ICD-10-CM | POA: Diagnosis not present

## 2022-05-09 DIAGNOSIS — R2681 Unsteadiness on feet: Secondary | ICD-10-CM | POA: Diagnosis not present

## 2022-05-09 DIAGNOSIS — M79651 Pain in right thigh: Secondary | ICD-10-CM | POA: Diagnosis not present

## 2022-05-11 DIAGNOSIS — M79651 Pain in right thigh: Secondary | ICD-10-CM | POA: Diagnosis not present

## 2022-05-11 DIAGNOSIS — M25551 Pain in right hip: Secondary | ICD-10-CM | POA: Diagnosis not present

## 2022-05-11 DIAGNOSIS — M25561 Pain in right knee: Secondary | ICD-10-CM | POA: Diagnosis not present

## 2022-05-15 DIAGNOSIS — Z9181 History of falling: Secondary | ICD-10-CM | POA: Diagnosis not present

## 2022-05-15 DIAGNOSIS — U071 COVID-19: Secondary | ICD-10-CM | POA: Diagnosis not present

## 2022-05-15 DIAGNOSIS — R2681 Unsteadiness on feet: Secondary | ICD-10-CM | POA: Diagnosis not present

## 2022-05-15 DIAGNOSIS — M6281 Muscle weakness (generalized): Secondary | ICD-10-CM | POA: Diagnosis not present

## 2022-05-15 DIAGNOSIS — M79651 Pain in right thigh: Secondary | ICD-10-CM | POA: Diagnosis not present

## 2022-05-17 DIAGNOSIS — M6281 Muscle weakness (generalized): Secondary | ICD-10-CM | POA: Diagnosis not present

## 2022-05-17 DIAGNOSIS — Z9181 History of falling: Secondary | ICD-10-CM | POA: Diagnosis not present

## 2022-05-17 DIAGNOSIS — U071 COVID-19: Secondary | ICD-10-CM | POA: Diagnosis not present

## 2022-05-17 DIAGNOSIS — M79651 Pain in right thigh: Secondary | ICD-10-CM | POA: Diagnosis not present

## 2022-05-17 DIAGNOSIS — R2681 Unsteadiness on feet: Secondary | ICD-10-CM | POA: Diagnosis not present

## 2022-05-21 DIAGNOSIS — R2681 Unsteadiness on feet: Secondary | ICD-10-CM | POA: Diagnosis not present

## 2022-05-21 DIAGNOSIS — Z9181 History of falling: Secondary | ICD-10-CM | POA: Diagnosis not present

## 2022-05-21 DIAGNOSIS — U071 COVID-19: Secondary | ICD-10-CM | POA: Diagnosis not present

## 2022-05-21 DIAGNOSIS — M79651 Pain in right thigh: Secondary | ICD-10-CM | POA: Diagnosis not present

## 2022-05-21 DIAGNOSIS — M6281 Muscle weakness (generalized): Secondary | ICD-10-CM | POA: Diagnosis not present

## 2022-05-23 DIAGNOSIS — M6281 Muscle weakness (generalized): Secondary | ICD-10-CM | POA: Diagnosis not present

## 2022-05-23 DIAGNOSIS — Z9181 History of falling: Secondary | ICD-10-CM | POA: Diagnosis not present

## 2022-05-23 DIAGNOSIS — U071 COVID-19: Secondary | ICD-10-CM | POA: Diagnosis not present

## 2022-05-23 DIAGNOSIS — R2681 Unsteadiness on feet: Secondary | ICD-10-CM | POA: Diagnosis not present

## 2022-05-23 DIAGNOSIS — M79651 Pain in right thigh: Secondary | ICD-10-CM | POA: Diagnosis not present

## 2022-05-31 ENCOUNTER — Non-Acute Institutional Stay: Payer: PPO | Admitting: Orthopedic Surgery

## 2022-05-31 ENCOUNTER — Encounter: Payer: Self-pay | Admitting: Orthopedic Surgery

## 2022-05-31 DIAGNOSIS — F0280 Dementia in other diseases classified elsewhere without behavioral disturbance: Secondary | ICD-10-CM | POA: Diagnosis not present

## 2022-05-31 DIAGNOSIS — G8929 Other chronic pain: Secondary | ICD-10-CM | POA: Diagnosis not present

## 2022-05-31 DIAGNOSIS — N3946 Mixed incontinence: Secondary | ICD-10-CM | POA: Diagnosis not present

## 2022-05-31 DIAGNOSIS — E785 Hyperlipidemia, unspecified: Secondary | ICD-10-CM | POA: Diagnosis not present

## 2022-05-31 DIAGNOSIS — G301 Alzheimer's disease with late onset: Secondary | ICD-10-CM | POA: Diagnosis not present

## 2022-05-31 DIAGNOSIS — E890 Postprocedural hypothyroidism: Secondary | ICD-10-CM

## 2022-05-31 DIAGNOSIS — E871 Hypo-osmolality and hyponatremia: Secondary | ICD-10-CM | POA: Diagnosis not present

## 2022-05-31 DIAGNOSIS — M8580 Other specified disorders of bone density and structure, unspecified site: Secondary | ICD-10-CM | POA: Diagnosis not present

## 2022-05-31 DIAGNOSIS — M25561 Pain in right knee: Secondary | ICD-10-CM | POA: Diagnosis not present

## 2022-05-31 DIAGNOSIS — I10 Essential (primary) hypertension: Secondary | ICD-10-CM | POA: Diagnosis not present

## 2022-06-03 DIAGNOSIS — E039 Hypothyroidism, unspecified: Secondary | ICD-10-CM | POA: Diagnosis not present

## 2022-06-03 DIAGNOSIS — E559 Vitamin D deficiency, unspecified: Secondary | ICD-10-CM | POA: Diagnosis not present

## 2022-06-03 DIAGNOSIS — D649 Anemia, unspecified: Secondary | ICD-10-CM | POA: Diagnosis not present

## 2022-06-03 LAB — CBC AND DIFFERENTIAL
HCT: 38 (ref 36–46)
Hemoglobin: 12.8 (ref 12.0–16.0)
Neutrophils Absolute: 3057
Platelets: 376 10*3/uL (ref 150–400)
WBC: 5.8

## 2022-06-03 LAB — VITAMIN D 25 HYDROXY (VIT D DEFICIENCY, FRACTURES): Vit D, 25-Hydroxy: 29

## 2022-06-03 LAB — COMPREHENSIVE METABOLIC PANEL
Albumin: 3.8 (ref 3.5–5.0)
Calcium: 9.5 (ref 8.7–10.7)
Globulin: 2.7
eGFR: 82

## 2022-06-03 LAB — BASIC METABOLIC PANEL
BUN: 16 (ref 4–21)
CO2: 26 — AB (ref 13–22)
Chloride: 98 — AB (ref 99–108)
Creatinine: 0.7 (ref 0.5–1.1)
Glucose: 92
Potassium: 4.2 mEq/L (ref 3.5–5.1)
Sodium: 132 — AB (ref 137–147)

## 2022-06-03 LAB — HEPATIC FUNCTION PANEL
ALT: 9 U/L (ref 7–35)
AST: 13 (ref 13–35)
Alkaline Phosphatase: 67 (ref 25–125)
Bilirubin, Total: 0.5

## 2022-06-03 LAB — TSH: TSH: 3.22 (ref 0.41–5.90)

## 2022-06-03 LAB — CBC: RBC: 4.23 (ref 3.87–5.11)

## 2022-07-23 ENCOUNTER — Encounter: Payer: Self-pay | Admitting: Nurse Practitioner

## 2022-07-23 ENCOUNTER — Non-Acute Institutional Stay: Payer: PPO | Admitting: Nurse Practitioner

## 2022-07-23 DIAGNOSIS — K5901 Slow transit constipation: Secondary | ICD-10-CM | POA: Diagnosis not present

## 2022-07-23 DIAGNOSIS — E782 Mixed hyperlipidemia: Secondary | ICD-10-CM | POA: Diagnosis not present

## 2022-07-23 DIAGNOSIS — I10 Essential (primary) hypertension: Secondary | ICD-10-CM | POA: Diagnosis not present

## 2022-07-23 DIAGNOSIS — F0390 Unspecified dementia without behavioral disturbance: Secondary | ICD-10-CM

## 2022-07-23 DIAGNOSIS — M810 Age-related osteoporosis without current pathological fracture: Secondary | ICD-10-CM

## 2022-07-23 DIAGNOSIS — K649 Unspecified hemorrhoids: Secondary | ICD-10-CM

## 2022-07-23 DIAGNOSIS — E890 Postprocedural hypothyroidism: Secondary | ICD-10-CM

## 2022-07-23 DIAGNOSIS — N3946 Mixed incontinence: Secondary | ICD-10-CM

## 2022-07-23 DIAGNOSIS — E871 Hypo-osmolality and hyponatremia: Secondary | ICD-10-CM

## 2022-07-23 NOTE — Assessment & Plan Note (Addendum)
Hemorrhoidal bleed reported x1 today, denied constipation, no active bleeding upon my examination, will apply 2.5% Hydrocortisone cream bid, Colace bid, avoid constipation. F/u CBC/diff.

## 2022-07-23 NOTE — Progress Notes (Signed)
Location:  [facility] Room Number: NO/24/A Place of Service:  ALF (13) Provider:  Tanea Moga X, NP Virgie Dad, MD  Patient Care Team: Virgie Dad, MD as PCP - General (Internal Medicine) Reynold Bowen, MD as Consulting Physician (Endocrinology)  Extended Emergency Contact Information Primary Emergency Contact: Rolon,Gordon Address: [address]          [place], [place] [address] [name] of [place] Phone: [phone] Mobile Phone: [phone] Relation: Spouse Secondary Emergency Contact: [name],[name] Address: [address]          [place], [place] [address] [name] of [place] Phone: [phone] Work Phone: [phone] Mobile Phone: [phone] Relation: Son  Code Status:  DNR Goals of care: Advanced Directive information    05/31/2022   11:05 AM  Advanced Directives  Does Patient Have a Medical Advance Directive? Yes  Type of Paramedic of [place];Living will;Out of facility DNR (pink MOST or yellow form)  Does patient want to make changes to medical advance directive? No - Patient declined  Copy of [place] in Chart? Yes - validated most recent copy scanned in chart (See row information)  Pre-existing out of facility DNR order (yellow form or pink MOST form) Pink MOST form placed in chart (order not valid for inpatient use)     Chief Complaint  Patient presents with  . Acute Visit    Patient is being seen for Hemorid bleeding    HPI:  Pt is a 88 y.o. female seen today for an acute visit for hemorrhoidal bleed reported x1 today, denied constipation, no active bleeding upon my examination    Alzheimer's disease, failed Donepezil, Memantine,   HTN, takes Amlodipine, Hyzaar  Urinary incontinence takes Ditropan  HLD didn't tolerate statin  Hypothyroidism, takes Levothyroxine  OP, DEXA 10/22  Hyponatremia,   Constipation, takes Senokot S  Past  Medical History:  Diagnosis Date  . Back pain   . Breast cancer ([place])   . Cancer [facility])    left breaset mastectomy   . Candidiasis of skin and nails   . Cellulitis and abscess of upper arm and forearm   . Heartburn   . Hemorrhoids 08/20/2016  . History of breast cancer 2000   History left mastectomy  . 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 Hypertension   . Insomnia   . Knee pain, bilateral 2014  . Lymphedema    Left arm following mastectomy  . Memory impairment   . Memory loss   . Migraine 05/16/2012  . Migraine without aura, without mention of intractable migraine without mention of status migrainosus    patient denies at preop of 08/08/16   . Other and unspecified hyperlipidemia   . Other bursitis disorders   . Other infective bursitis, left shoulder   . Pain in joint, ankle and foot   . Pain in joint, hand   . Pain in joint, lower leg   . Pain in joint, shoulder region   . Pain in joint, shoulder region 04/23/2016  . Pain in limb   . Rectal bleeding 08/20/2016  . Senile osteoporosis   . Symptomatic menopausal or female climacteric states   . Syncope and collapse   . Unspecified hypertensive heart disease without heart failure 1995  . Unspecified hypothyroidism   . Unspecified polyarthropathy or polyarthritis, site unspecified   . Unspecified urinary incontinence   . Unspecified visual loss   . Xerophthalmia  Past Surgical History:  Procedure Laterality Date  . ABDOMINAL HYSTERECTOMY    . BILATERAL OOPHORECTOMY  1981  . BREAST RECONSTRUCTION Left 01/2000  . BREAST REDUCTION SURGERY Right 01/2000  . CATARACT EXTRACTION Right 09/04/2005  . COLONOSCOPY  01/2004  . MASTECTOMY Left 10/1999  . THYROID LOBECTOMY Left 08/14/2016   Procedure: NEAR TOTAL THYROIDECTOMY;  Surgeon: Johnathan Hausen, MD;  Location: [facility];  Service: General;  Laterality: Left;  . TOTAL ABDOMINAL HYSTERECTOMY W/ BILATERAL SALPINGOOPHORECTOMY  1981  . TOTAL KNEE ARTHROPLASTY Right 2014     Allergies  Allergen Reactions  . Donepezil Hcl     Body pain and stiffness   . Namenda [Memantine Hcl]      Caused body pain   . Sulfa Antibiotics   . Sulfamethoxazole Rash    Outpatient Encounter Medications as of 07/23/2022  Medication Sig  . amLODipine (NORVASC) 2.5 MG tablet TAKE 1 TABLET BY MOUTH ONCE DAILY TO LOWER BLOOD PRESSURE  . Cholecalciferol 5000 units capsule Take 5,000 Units by mouth daily.  Marland Kitchen levothyroxine (SYNTHROID) 112 MCG tablet Take 1 tablet (112 mcg total) by mouth daily before breakfast.  . losartan-hydrochlorothiazide (HYZAAR) 100-12.5 MG tablet Take 1 tablet by mouth once daily  . Multiple Vitamins-Minerals (CENTRUM SILVER PO) Take 1 tablet by mouth daily.   . Omega-3 Fatty Acids (OMEGA-3 CF PO) Take 520 mg by mouth daily.   Marland Kitchen oxybutynin (DITROPAN XL) 15 MG 24 hr tablet Take 15 mg by mouth daily.  Vladimir Faster Glycol-Propyl Glycol 0.4-0.3 % SOLN Place 2 drops into both eyes daily as needed (dry eyes).   . sennosides-docusate sodium (SENOKOT-S) 8.6-50 MG tablet Take 1 tablet by mouth daily.  Marland Kitchen triamcinolone (KENALOG) 0.025 % cream Apply 1 application. topically 2 (two) times daily.  Marland Kitchen zinc oxide 20 % ointment Apply 1 application. topically as needed for irritation.   No facility-administered encounter medications on file as of 07/23/2022.    Review of Systems  Immunization History  Administered Date(s) Administered  . Influenza, High Dose Seasonal PF 09/17/2017, 09/10/2018, 09/22/2019, 09/13/2021  . Influenza, Quadrivalent, Recombinant, Inj, Pf 08/29/2020  . Influenza,inj,Quad PF,6+ Mos 09/10/2018  . Influenza-Unspecified 08/10/2013, 09/07/2015, 09/13/2016  . Moderna SARS-COV2 Booster Vaccination 05/16/2021  . Moderna Sars-Covid-2 Vaccination 12/13/2019, 01/10/2020, 10/23/2020  . PFIZER(Purple Top)SARS-COV-2 Vaccination 08/29/2021  . Pneumococcal Conjugate-13 06/08/2017, 11/02/2018  . Pneumococcal Polysaccharide-23 12/09/2002, 06/08/2017, 10/22/2017,  11/02/2018  . Pneumococcal-Unspecified 06/08/2017, 11/02/2018  . Tdap 02/21/2014, 10/29/2017  . Zoster Recombinat (Shingrix) 07/18/2017, 01/10/2018  . Zoster, Live 07/18/2017, 01/10/2018   Pertinent  Health Maintenance Due  Topic Date Due  . INFLUENZA VACCINE  07/09/2022  . DEXA SCAN  Completed      09/06/2020    8:35 AM 09/06/2020    8:39 AM 03/07/2021    1:03 PM 09/05/2021    8:54 AM 02/18/2022    2:20 PM  [place] in the past year? 0 0 0 0   Was there an injury with Fall?     0  Patient Fall Risk Level    Low fall risk Moderate fall risk  Patient at Risk for Falls Due to     History of fall(s);Impaired balance/gait;Impaired mobility  Fall risk Follow up    Falls evaluation completed Falls evaluation completed;Education provided;Falls prevention discussed   Functional Status Survey:    Vitals:   07/23/22 1450  BP: 126/74  Pulse: 73  Resp: 16  Temp: (!) 96.7 F (35.9 C)  SpO2: 97%  Weight: 164  lb (74.4 kg)  Height: 5' 4"  (1.626 m)   Body mass index is 28.15 kg/m. Physical Exam  Labs reviewed: Recent Labs    08/30/21 0805  NA 134*  K 4.2  CL 99  CO2 26  GLUCOSE 95  BUN 14  CREATININE 0.69  CALCIUM 9.8   Recent Labs    08/30/21 0805  AST 16  ALT 14  BILITOT 0.4  PROT 6.8   Recent Labs    08/30/21 0805 09/17/21 1615  WBC 6.2 10.2  NEUTROABS 3,559 7,405  HGB 13.2 13.0  HCT 39.1 37.9  MCV 89.5 89.0  PLT 389 391   Lab Results  Component Value Date   TSH 2.30 02/28/2021   No results found for: "HGBA1C" Lab Results  Component Value Date   CHOL 190 07/26/2020   HDL 56 07/26/2020   LDLCALC 109 (H) 07/26/2020   TRIG 139 07/26/2020   CHOLHDL 3.4 07/26/2020    Significant Diagnostic Results in last 30 days:  No results found.  Assessment/Plan No problem-specific Assessment & Plan notes found for this encounter.     Family/ staff Communication: plan of care reviewed with the patient and charge nurse.   Labs/tests ordered:   CBC/diff, CMP/eGFR, TSH  Time spend 35 minutes.

## 2022-07-25 ENCOUNTER — Encounter: Payer: Self-pay | Admitting: Nurse Practitioner

## 2022-07-25 DIAGNOSIS — E039 Hypothyroidism, unspecified: Secondary | ICD-10-CM | POA: Diagnosis not present

## 2022-07-25 DIAGNOSIS — E871 Hypo-osmolality and hyponatremia: Secondary | ICD-10-CM | POA: Insufficient documentation

## 2022-07-25 NOTE — Assessment & Plan Note (Signed)
needs f/u Na

## 2022-07-25 NOTE — Assessment & Plan Note (Signed)
failed Donepezil, Memantine

## 2022-07-25 NOTE — Assessment & Plan Note (Signed)
DEXA 10/22

## 2022-07-25 NOTE — Assessment & Plan Note (Signed)
takes Senokot S    

## 2022-07-25 NOTE — Assessment & Plan Note (Signed)
takes Ditropan

## 2022-07-25 NOTE — Assessment & Plan Note (Signed)
takes Amlodipine, Hyzaar, needs CMP

## 2022-07-25 NOTE — Assessment & Plan Note (Signed)
takes Levothyroxine, needs TSH

## 2022-07-25 NOTE — Assessment & Plan Note (Signed)
didn't tolerate statin

## 2022-07-26 LAB — COMPREHENSIVE METABOLIC PANEL
Albumin: 3.9 (ref 3.5–5.0)
Calcium: 9.7 (ref 8.7–10.7)
Globulin: 3.1
eGFR: 85

## 2022-07-26 LAB — BASIC METABOLIC PANEL
BUN: 15 (ref 4–21)
CO2: 26 — AB (ref 13–22)
Chloride: 95 — AB (ref 99–108)
Creatinine: 0.6 (ref 0.5–1.1)
Glucose: 102
Potassium: 4.1 mEq/L (ref 3.5–5.1)
Sodium: 130 — AB (ref 137–147)

## 2022-07-26 LAB — HEPATIC FUNCTION PANEL
ALT: 12 U/L (ref 7–35)
AST: 15 (ref 13–35)
Alkaline Phosphatase: 62 (ref 25–125)
Bilirubin, Total: 0.4

## 2022-07-26 LAB — CBC AND DIFFERENTIAL
HCT: 37 (ref 36–46)
Hemoglobin: 12.8 (ref 12.0–16.0)
Neutrophils Absolute: 3372
Platelets: 364 10*3/uL (ref 150–400)
WBC: 6

## 2022-07-26 LAB — TSH: TSH: 4.5 (ref 0.41–5.90)

## 2022-07-26 LAB — CBC: RBC: 4.19 (ref 3.87–5.11)

## 2022-07-29 DIAGNOSIS — I10 Essential (primary) hypertension: Secondary | ICD-10-CM | POA: Diagnosis not present

## 2022-07-29 LAB — COMPREHENSIVE METABOLIC PANEL
Calcium: 9.9 (ref 8.7–10.7)
eGFR: 84

## 2022-07-29 LAB — BASIC METABOLIC PANEL
BUN: 13 (ref 4–21)
CO2: 24 — AB (ref 13–22)
Chloride: 97 — AB (ref 99–108)
Creatinine: 0.6 (ref 0.5–1.1)
Glucose: 81
Potassium: 4.1 mEq/L (ref 3.5–5.1)
Sodium: 132 — AB (ref 137–147)

## 2022-08-13 ENCOUNTER — Other Ambulatory Visit: Payer: Self-pay | Admitting: Internal Medicine

## 2022-08-13 DIAGNOSIS — Z1231 Encounter for screening mammogram for malignant neoplasm of breast: Secondary | ICD-10-CM

## 2022-09-09 DIAGNOSIS — H35033 Hypertensive retinopathy, bilateral: Secondary | ICD-10-CM | POA: Diagnosis not present

## 2022-09-19 DIAGNOSIS — E039 Hypothyroidism, unspecified: Secondary | ICD-10-CM | POA: Diagnosis not present

## 2022-09-19 DIAGNOSIS — I10 Essential (primary) hypertension: Secondary | ICD-10-CM | POA: Diagnosis not present

## 2022-09-19 LAB — COMPREHENSIVE METABOLIC PANEL
Calcium: 9.6 (ref 8.7–10.7)
eGFR: 84

## 2022-09-19 LAB — BASIC METABOLIC PANEL
BUN: 15 (ref 4–21)
CO2: 28 — AB (ref 13–22)
Chloride: 94 — AB (ref 99–108)
Creatinine: 0.6 (ref 0.5–1.1)
Glucose: 80
Potassium: 4.3 mEq/L (ref 3.5–5.1)
Sodium: 129 — AB (ref 137–147)

## 2022-09-20 ENCOUNTER — Non-Acute Institutional Stay: Payer: PPO | Admitting: Nurse Practitioner

## 2022-09-20 ENCOUNTER — Encounter: Payer: Self-pay | Admitting: Nurse Practitioner

## 2022-09-20 DIAGNOSIS — E890 Postprocedural hypothyroidism: Secondary | ICD-10-CM | POA: Diagnosis not present

## 2022-09-20 DIAGNOSIS — E782 Mixed hyperlipidemia: Secondary | ICD-10-CM

## 2022-09-20 DIAGNOSIS — F0390 Unspecified dementia without behavioral disturbance: Secondary | ICD-10-CM

## 2022-09-20 DIAGNOSIS — K5901 Slow transit constipation: Secondary | ICD-10-CM

## 2022-09-20 DIAGNOSIS — I10 Essential (primary) hypertension: Secondary | ICD-10-CM

## 2022-09-20 DIAGNOSIS — N3946 Mixed incontinence: Secondary | ICD-10-CM

## 2022-09-20 DIAGNOSIS — E871 Hypo-osmolality and hyponatremia: Secondary | ICD-10-CM | POA: Diagnosis not present

## 2022-09-20 DIAGNOSIS — M5441 Lumbago with sciatica, right side: Secondary | ICD-10-CM | POA: Insufficient documentation

## 2022-09-20 DIAGNOSIS — M810 Age-related osteoporosis without current pathological fracture: Secondary | ICD-10-CM | POA: Diagnosis not present

## 2022-09-20 MED ORDER — AMLODIPINE BESYLATE 2.5 MG PO TABS: ORAL_TABLET | ORAL | 1 refills | Status: DC

## 2022-09-20 NOTE — Assessment & Plan Note (Signed)
DEXA 10/22, taking Vit D

## 2022-09-20 NOTE — Progress Notes (Signed)
Location:   [facility] Room Number: AL 24/A Place of Service:  ALF (13) Provider: [facility] Jailyn Langhorst NP  Virgie Dad, MD  Patient Care Team: Virgie Dad, MD as PCP - General (Internal Medicine) Reynold Bowen, MD as Consulting Physician (Endocrinology)  Extended Emergency Contact Information Primary Emergency Contact: Will,Gordon Address: [address]          [place], [place] [address] [name] of [place] Phone: [phone] Mobile Phone: [phone] Relation: Spouse Secondary Emergency Contact: [name],[name] Address: [address]          [place], [place] [address] [name] of [place] Phone: [phone] Work Phone: [phone] Mobile Phone: [phone] Relation: Son  Code Status:  DNR Goals of care: Advanced Directive information    05/31/2022   11:05 AM  Advanced Directives  Does Patient Have a Medical Advance Directive? Yes  Type of Paramedic of [place];Living will;Out of facility DNR (pink MOST or yellow form)  Does patient want to make changes to medical advance directive? No - Patient declined  Copy of [place] in Chart? Yes - validated most recent copy scanned in chart (See row information)  Pre-existing out of facility DNR order (yellow form or pink MOST form) Pink MOST form placed in chart (order not valid for inpatient use)     Chief Complaint  Patient presents with  . Acute Visit    Low sodium    HPI:  Pt is a 88 y.o. female seen today for an acute visit for low sodium Na 129 10/12/2 3   Alzheimer's disease, failed Donepezil, Memantine             HTN, takes Amlodipine, Hyzaar, Bun/creat 15/0.61 09/19/22             Urinary incontinence takes Ditropan             HLD didn't tolerate statin             Hypothyroidism, takes Levothyroxine, TSH 4.5 07/25/22             OP, DEXA 10/22, taking Vit D             Hyponatremia, Na 132 07/29/22>>129 09/19/22              Constipation, takes Senokot S   Past Medical History:  Diagnosis Date  . Back pain   . Breast cancer ([place])   . Cancer [facility])    left breaset mastectomy   . Candidiasis of skin and nails   . Cellulitis and abscess of upper arm and forearm   . Heartburn   . Hemorrhoids 08/20/2016  . History of breast cancer 2000   History left mastectomy  . Hurthle cell adenocarcinoma ([place]) 08/20/2016  . Hypertension   . Insomnia   . Knee pain, bilateral 2014  . Lymphedema    Left arm following mastectomy  . Memory impairment   . Memory loss   . Migraine 05/16/2012  . Migraine without aura, without mention of intractable migraine without mention of status migrainosus    patient denies at preop of 08/08/16   . Other and unspecified hyperlipidemia   . Other bursitis disorders   . Other infective bursitis, left shoulder   . Pain in joint, ankle and foot   . Pain in joint, hand   . Pain in joint, lower leg   . Pain in joint, shoulder region   . Pain in joint,  shoulder region 04/23/2016  . Pain in limb   . Rectal bleeding 08/20/2016  . Senile osteoporosis   . Symptomatic menopausal or female climacteric states   . Syncope and collapse   . Unspecified hypertensive heart disease without heart failure 1995  . Unspecified hypothyroidism   . Unspecified polyarthropathy or polyarthritis, site unspecified   . Unspecified urinary incontinence   . Unspecified visual loss   . Xerophthalmia    Past Surgical History:  Procedure Laterality Date  . ABDOMINAL HYSTERECTOMY    . BILATERAL OOPHORECTOMY  1981  . BREAST RECONSTRUCTION Left 01/2000  . BREAST REDUCTION SURGERY Right 01/2000  . CATARACT EXTRACTION Right 09/04/2005  . COLONOSCOPY  01/2004  . MASTECTOMY Left 10/1999  . THYROID LOBECTOMY Left 08/14/2016   Procedure: NEAR TOTAL THYROIDECTOMY;  Surgeon: Johnathan Hausen, MD;  Location: [facility];  Service: General;  Laterality: Left;  . TOTAL ABDOMINAL HYSTERECTOMY W/ BILATERAL SALPINGOOPHORECTOMY   1981  . TOTAL KNEE ARTHROPLASTY Right 2014    Allergies  Allergen Reactions  . Donepezil Hcl     Body pain and stiffness   . Namenda [Memantine Hcl]      Caused body pain   . Sulfa Antibiotics   . Sulfamethoxazole Rash    Allergies as of 09/20/2022       Reactions   Donepezil Hcl    Body pain and stiffness   Namenda [memantine Hcl]     Caused body pain    Sulfa Antibiotics    Sulfamethoxazole Rash        Medication List        Accurate as of September 20, 2022 11:43 AM. If you have any questions, ask your nurse or doctor.          amLODipine 2.5 MG tablet Commonly known as: NORVASC Takes 7.'5mg'$  po qd What changed: See the new instructions. Changed by: Benedict Kue X Jahleel Stroschein, NP   CENTRUM SILVER PO Take 1 tablet by mouth daily.   Cholecalciferol 125 MCG (5000 UT) capsule Take 5,000 Units by mouth daily.   levothyroxine 112 MCG tablet Commonly known as: SYNTHROID Take 1 tablet (112 mcg total) by mouth daily before breakfast.   losartan-hydrochlorothiazide 100-12.5 MG tablet Commonly known as: HYZAAR Take 1 tablet by mouth once daily   OMEGA-3 CF PO Take 520 mg by mouth daily.   oxybutynin 15 MG 24 hr tablet Commonly known as: DITROPAN XL Take 15 mg by mouth daily.   Polyethyl Glycol-Propyl Glycol 0.4-0.3 % Soln Place 2 drops into both eyes daily as needed (dry eyes).   sennosides-docusate sodium 8.6-50 MG tablet Commonly known as: SENOKOT-S Take 1 tablet by mouth daily.   triamcinolone 0.025 % cream Commonly known as: KENALOG Apply 1 application. topically 2 (two) times daily.   zinc oxide 20 % ointment Apply 1 application. topically as needed for irritation.        Review of Systems  Constitutional:  Negative for activity change and fever.  HENT:  Negative for congestion and trouble swallowing.   Eyes:  Negative for visual disturbance.  Respiratory:  Negative for cough and wheezing.   Cardiovascular:  Negative for leg swelling.   Gastrointestinal:  Negative for abdominal pain, constipation and rectal pain.  Genitourinary:  Positive for frequency. Negative for dysuria and urgency.  Musculoskeletal:  Positive for arthralgias and gait problem.       Pain in the right buttock and thigh complained.   Neurological:  Negative for speech difficulty and headaches.  Dementia.   Psychiatric/Behavioral:  The patient is not nervous/anxious.     Immunization History  Administered Date(s) Administered  . Influenza, High Dose Seasonal PF 09/17/2017, 09/10/2018, 09/22/2019, 09/13/2021  . Influenza, Quadrivalent, Recombinant, Inj, Pf 08/29/2020  . Influenza,inj,Quad PF,6+ Mos 09/10/2018  . Influenza-Unspecified 08/10/2013, 09/07/2015, 09/13/2016  . Moderna SARS-COV2 Booster Vaccination 05/16/2021  . Moderna Sars-Covid-2 Vaccination 12/13/2019, 01/10/2020, 10/23/2020  . PFIZER(Purple Top)SARS-COV-2 Vaccination 08/29/2021  . Pneumococcal Conjugate-13 06/08/2017, 11/02/2018  . Pneumococcal Polysaccharide-23 12/09/2002, 06/08/2017, 10/22/2017, 11/02/2018  . Pneumococcal-Unspecified 06/08/2017, 11/02/2018  . Tdap 02/21/2014, 10/29/2017  . Zoster Recombinat (Shingrix) 07/18/2017, 01/10/2018  . Zoster, Live 07/18/2017, 01/10/2018   Pertinent  Health Maintenance Due  Topic Date Due  . INFLUENZA VACCINE  07/09/2022  . DEXA SCAN  Completed      09/06/2020    8:35 AM 09/06/2020    8:39 AM 03/07/2021    1:03 PM 09/05/2021    8:54 AM 02/18/2022    2:20 PM  [place] in the past year? 0 0 0 0   Was there an injury with Fall?     0  Patient Fall Risk Level    Low fall risk Moderate fall risk  Patient at Risk for Falls Due to     History of fall(s);Impaired balance/gait;Impaired mobility  Fall risk Follow up    Falls evaluation completed Falls evaluation completed;Education provided;Falls prevention discussed   Functional Status Survey:    Vitals:   09/20/22 1105  BP: (!) 146/75  Pulse: 77  Resp: 18  Temp: 97.8 F  (36.6 C)  SpO2: 94%  Weight: 165 lb 6.4 oz (75 kg)  Height: '5\' 4"'$  (1.626 m)   Body mass index is 28.39 kg/m. Physical Exam Vitals and nursing note reviewed.  Constitutional:      Appearance: Normal appearance.  HENT:     Head: Normocephalic and atraumatic.     Nose: Nose normal.     Mouth/Throat:     Mouth: Mucous membranes are moist.  Eyes:     Extraocular Movements: Extraocular movements intact.     Conjunctiva/sclera: Conjunctivae normal.     Pupils: Pupils are equal, round, and reactive to light.  Cardiovascular:     Rate and Rhythm: Normal rate and regular rhythm.     Heart sounds: No murmur heard. Pulmonary:     Effort: Pulmonary effort is normal.     Breath sounds: No wheezing or rales.  Abdominal:     General: Bowel sounds are normal.     Palpations: Abdomen is soft.     Tenderness: There is no abdominal tenderness.  Musculoskeletal:        General: Tenderness present.     Cervical back: Normal range of motion and neck supple.     Right lower leg: No edema.     Left lower leg: No edema.     Comments: Positive straight leg raise, pain in the right buttock, thigh, and with weight bearing.   Skin:    General: Skin is warm and dry.  Neurological:     General: No focal deficit present.     Mental Status: She is alert. Mental status is at baseline.     Gait: Gait abnormal.     Comments: Oriented to person, her room on unit.   Psychiatric:        Mood and Affect: Mood normal.        Behavior: Behavior normal.    Labs reviewed: No results for input(s): "NA", "  K", "CL", "CO2", "GLUCOSE", "BUN", "CREATININE", "CALCIUM", "MG", "PHOS" in the last 8760 hours. No results for input(s): "AST", "ALT", "ALKPHOS", "BILITOT", "PROT", "ALBUMIN" in the last 8760 hours. No results for input(s): "WBC", "NEUTROABS", "HGB", "HCT", "MCV", "PLT" in the last 8760 hours. Lab Results  Component Value Date   TSH 2.30 02/28/2021   No results found for: "HGBA1C" Lab Results   Component Value Date   CHOL 190 07/26/2020   HDL 56 07/26/2020   LDLCALC 109 (H) 07/26/2020   TRIG 139 07/26/2020   CHOLHDL 3.4 07/26/2020    Significant Diagnostic Results in last 30 days:  No results found.  Assessment/Plan Hyponatremia Na 132 07/29/22>>129 09/19/22, will dc HCTZ, update BMP  Hypertension Blood pressure is controlled, dc HCTZ, continue Losartan, Bp daily.   Dementia without behavioral disturbance ([place]) failed Donepezil, Memantine, resides in [place] [facility] for [facility]  Urinary incontinence takes Ditropan  Hyperlipidemia didn't tolerate statin  Hypothyroidism takes Levothyroxine, TSH 4.5 07/25/22  Senile osteoporosis DEXA 10/22, taking Vit D  Constipation takes Senokot S  Right-sided low back pain with sciatica Try Tylenol '650mg'$  with meals, may consider Gabapentin or Robaxin. Obtain X-ray lumbar spine, sacrum, pelvis, R hip/femur.     Family/ staff Communication: plan of care reviewed with the patient and charge nurse.   Labs/tests ordered:  BMP, X-ray lumbar spine, sacrum, pelvis, R hip/femur.   Time spend 40 minutes.

## 2022-09-20 NOTE — Progress Notes (Unsigned)
Location:  [facility] Room Number: AL 24/A Place of Service:  ALF [phone]) Provider: Kerrie Buffalo NP   Frances Dad, MD  Patient Care Team: Frances Dad, MD as PCP - General (Internal Medicine) Reynold Bowen, MD as Consulting Physician (Endocrinology)  Extended Emergency Contact Information Primary Emergency Contact: Frances Holland Address: [address] [place]          [place], [place] [address] Frances Holland Phone: [phone] Mobile Phone: [phone] Relation: Spouse Secondary Emergency Contact: Frances Holland Address: [address]          [place], [place] [address] Frances Litter of [place] Phone: [phone] Work Phone: [phone] Mobile Phone: [phone] Relation: Son  Code Status:  DNR Managed Care Goals of care: Advanced Directive information    09/20/2022   11:50 AM  Advanced Directives  Does Patient Have a Medical Advance Directive? Yes  Type of Paramedic of [place];Living will;Out of facility DNR (pink MOST or yellow form)  Does patient want to make changes to medical advance directive? No - Patient declined  Copy of [place] in Chart? Yes - validated most recent copy scanned in chart (See row information)  Pre-existing out of facility DNR order (yellow form or pink MOST form) Pink Most/Yellow Form available - Physician notified to receive inpatient order;Yellow form placed in chart (order not valid for inpatient use)     Chief Complaint  Patient presents with   Acute Visit    Low sodium    HPI:  Pt is a 88 y.o. 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   Hypertension    Insomnia    Knee pain, bilateral 2014   Lymphedema    Left arm following mastectomy   Memory impairment    Memory loss    Migraine 05/16/2012   Migraine without aura, without mention of intractable migraine without mention of status migrainosus    patient denies at preop of 08/08/16    Other and unspecified hyperlipidemia    Other bursitis disorders    Other infective bursitis, left shoulder    Pain in joint, ankle and foot    Pain in joint, hand    Pain in joint, lower leg    Pain in joint, shoulder region    Pain in joint, shoulder region 04/23/2016   Pain in limb    Rectal bleeding 08/20/2016   Senile osteoporosis    Symptomatic menopausal or female climacteric states    Syncope and collapse    Unspecified hypertensive heart disease without heart failure 1995   Unspecified hypothyroidism    Unspecified polyarthropathy or polyarthritis, site unspecified    Unspecified urinary incontinence    Unspecified visual loss    Xerophthalmia    Past Surgical History:  Procedure Laterality Date   ABDOMINAL HYSTERECTOMY     BILATERAL OOPHORECTOMY  1981   BREAST RECONSTRUCTION Left 01/2000   BREAST REDUCTION SURGERY Right 01/2000   CATARACT EXTRACTION Right 09/04/2005  COLONOSCOPY  01/2004   MASTECTOMY Left 10/1999   THYROID LOBECTOMY Left 08/14/2016   Procedure: NEAR TOTAL THYROIDECTOMY;  Surgeon: Johnathan Hausen, MD;  Location: [facility];  Service: General;  Laterality: Left;   TOTAL ABDOMINAL HYSTERECTOMY W/ BILATERAL SALPINGOOPHORECTOMY  1981   TOTAL KNEE ARTHROPLASTY Right 2014    Allergies  Allergen Reactions   Donepezil Hcl     Body pain and stiffness    Namenda [Memantine Hcl]      Caused body pain    Sulfa Antibiotics    Sulfamethoxazole Rash    Allergies as of 09/20/2022       Reactions   Donepezil Hcl    Body pain and stiffness   Namenda [memantine Hcl]     Caused  body pain    Sulfa Antibiotics    Sulfamethoxazole Rash        Medication List        Accurate as of September 20, 2022 11:54 AM. If you have any questions, ask your nurse or doctor.          amLODipine 2.5 MG tablet Commonly known as: NORVASC Takes 7.'5mg'$  po qd What changed: See the new instructions. Changed by: Jamyah Folk X Tyriq Moragne, NP   CENTRUM SILVER PO Take 1 tablet by mouth daily.   Cholecalciferol 125 MCG (5000 UT) capsule Take 5,000 Units by mouth daily.   levothyroxine 112 MCG tablet Commonly known as: SYNTHROID Take 1 tablet (112 mcg total) by mouth daily before breakfast.   losartan-hydrochlorothiazide 100-12.5 MG tablet Commonly known as: HYZAAR Take 1 tablet by mouth once daily   OMEGA-3 CF PO Take 520 mg by mouth daily.   oxybutynin 15 MG 24 hr tablet Commonly known as: DITROPAN XL Take 15 mg by mouth daily.   Polyethyl Glycol-Propyl Glycol 0.4-0.3 % Soln Place 2 drops into both eyes daily as needed (dry eyes).   sennosides-docusate sodium 8.6-50 MG tablet Commonly known as: SENOKOT-S Take 1 tablet by mouth daily.   triamcinolone 0.025 % cream Commonly known as: KENALOG Apply 1 application. topically 2 (two) times daily.   zinc oxide 20 % ointment Apply 1 application. topically as needed for irritation.        Review of Systems  Immunization History  Administered Date(s) Administered   Influenza, High Dose Seasonal PF 09/17/2017, 09/10/2018, 09/22/2019, 09/13/2021   Influenza, Quadrivalent, Recombinant, Inj, Pf 08/29/2020   Influenza,inj,Quad PF,6+ Mos 09/10/2018   Influenza-Unspecified 08/10/2013, 09/07/2015, 09/13/2016   Moderna Covid-19 Vaccine Bivalent Booster 43yr & up 05/22/2022   Moderna SARS-COV2 Booster Vaccination 05/16/2021   Moderna Sars-Covid-2 Vaccination 12/13/2019, 01/10/2020, 10/23/2020   PFIZER(Purple Top)SARS-COV-2 Vaccination 08/29/2021   Pneumococcal Conjugate-13 06/08/2017, 11/02/2018   Pneumococcal Polysaccharide-23  12/09/2002, 06/08/2017, 10/22/2017, 11/02/2018   Pneumococcal-Unspecified 06/08/2017, 11/02/2018   Tdap 02/21/2014, 10/29/2017   Zoster Recombinat (Shingrix) 07/18/2017, 01/10/2018   Zoster, Live 07/18/2017, 01/10/2018   Pertinent  Health Maintenance Due  Topic Date Due   INFLUENZA VACCINE  07/09/2022   DEXA SCAN  Completed      09/06/2020    8:35 AM 09/06/2020    8:39 AM 03/07/2021    1:03 PM 09/05/2021    8:54 AM 02/18/2022    2:20 PM  Fall Risk  Falls in the past year? 0 0 0 0   Was there an injury with Fall?     0  Patient Fall Risk Level    Low fall risk Moderate fall risk  Patient at Risk for Falls Due to  History of fall(s);Impaired balance/gait;Impaired mobility  Fall risk Follow up    Falls evaluation completed Falls evaluation completed;Education provided;Falls prevention discussed   Functional Status Survey:    Vitals:   09/20/22 1105  BP: (!) 146/75  Pulse: 77  Resp: 18  Temp: 97.8 F (36.6 C)  SpO2: 94%  Weight: 165 lb 6.4 oz (75 kg)  Height: '5\' 4"'$  (1.626 m)   Body mass index is 28.39 kg/m. Physical Exam  Labs reviewed: No results for input(s): "NA", "K", "CL", "CO2", "GLUCOSE", "BUN", "CREATININE", "CALCIUM", "MG", "PHOS" in the last 8760 hours. No results for input(s): "AST", "ALT", "ALKPHOS", "BILITOT", "PROT", "ALBUMIN" in the last 8760 hours. No results for input(s): "WBC", "NEUTROABS", "HGB", "HCT", "MCV", "PLT" in the last 8760 hours. Lab Results  Component Value Date   TSH 2.30 02/28/2021   No results found for: "HGBA1C" Lab Results  Component Value Date   CHOL 190 07/26/2020   HDL 56 07/26/2020   LDLCALC 109 (H) 07/26/2020   TRIG 139 07/26/2020   CHOLHDL 3.4 07/26/2020    Significant Diagnostic Results in last 30 days:  No results found.  Assessment/Plan 1. Hyponatremia ***  2. Primary hypertension ***  3. Dementia without behavioral disturbance (HCC) ***  4. Mixed stress and urge urinary incontinence ***  5. Mixed  hyperlipidemia ***  6. Postoperative hypothyroidism ***  7. Senile osteoporosis ***  8. Slow transit constipation ***  9. Right-sided low back pain with right-sided sciatica, unspecified chronicity ***    Family/ staff Communication:   Labs/tests ordered:

## 2022-09-20 NOTE — Assessment & Plan Note (Signed)
Blood pressure is controlled, dc HCTZ, continue Losartan, Bp daily.

## 2022-09-20 NOTE — Assessment & Plan Note (Signed)
Na 132 07/29/22>>129 09/19/22, will dc HCTZ, update BMP

## 2022-09-20 NOTE — Assessment & Plan Note (Signed)
takes Senokot S    

## 2022-09-20 NOTE — Assessment & Plan Note (Signed)
failed Donepezil, Memantine, resides in [facility] for supportive care

## 2022-09-20 NOTE — Assessment & Plan Note (Signed)
didn't tolerate statin

## 2022-09-20 NOTE — Assessment & Plan Note (Signed)
takes Ditropan

## 2022-09-20 NOTE — Assessment & Plan Note (Signed)
Try Tylenol '650mg'$  with meals, may consider Gabapentin or Robaxin. Obtain X-ray lumbar spine, sacrum, pelvis, R hip/femur.

## 2022-09-20 NOTE — Assessment & Plan Note (Signed)
takes Levothyroxine, TSH 4.5 07/25/22

## 2022-09-21 ENCOUNTER — Telehealth: Payer: Self-pay | Admitting: Adult Health

## 2022-09-21 DIAGNOSIS — M79651 Pain in right thigh: Secondary | ICD-10-CM | POA: Diagnosis not present

## 2022-09-21 DIAGNOSIS — M545 Low back pain, unspecified: Secondary | ICD-10-CM | POA: Diagnosis not present

## 2022-09-21 DIAGNOSIS — M533 Sacrococcygeal disorders, not elsewhere classified: Secondary | ICD-10-CM | POA: Diagnosis not present

## 2022-09-21 DIAGNOSIS — M25551 Pain in right hip: Secondary | ICD-10-CM | POA: Diagnosis not present

## 2022-09-21 MED ORDER — LIDOCAINE 5 % EX PTCH
1.0000 | MEDICATED_PATCH | CUTANEOUS | 0 refills | Status: DC
Start: 2022-09-21 — End: 2022-10-15

## 2022-09-21 NOTE — Telephone Encounter (Signed)
Nurse called to report lumbar xray result which showed mild subluxation final impression osteoarthritis. Pt is having low back pain. Does not wish to pursue ortho surgeon per nurse or stronger meds. Requesting to continue tylenol and try lidocaine patch, order given.

## 2022-09-23 ENCOUNTER — Encounter: Payer: Self-pay | Admitting: Nurse Practitioner

## 2022-09-23 DIAGNOSIS — I10 Essential (primary) hypertension: Secondary | ICD-10-CM | POA: Diagnosis not present

## 2022-09-23 LAB — BASIC METABOLIC PANEL
BUN: 13 (ref 4–21)
CO2: 28 — AB (ref 13–22)
Chloride: 97 — AB (ref 99–108)
Creatinine: 0.7 (ref 0.5–1.1)
Glucose: 85
Potassium: 4.2 mEq/L (ref 3.5–5.1)
Sodium: 131 — AB (ref 137–147)

## 2022-09-23 LAB — COMPREHENSIVE METABOLIC PANEL
Calcium: 9.3 (ref 8.7–10.7)
eGFR: 83

## 2022-09-27 ENCOUNTER — Ambulatory Visit: Payer: PPO

## 2022-10-03 ENCOUNTER — Encounter: Payer: Self-pay | Admitting: Internal Medicine

## 2022-10-03 ENCOUNTER — Non-Acute Institutional Stay: Payer: PPO | Admitting: Internal Medicine

## 2022-10-03 DIAGNOSIS — I10 Essential (primary) hypertension: Secondary | ICD-10-CM | POA: Diagnosis not present

## 2022-10-03 DIAGNOSIS — E782 Mixed hyperlipidemia: Secondary | ICD-10-CM | POA: Diagnosis not present

## 2022-10-03 DIAGNOSIS — K649 Unspecified hemorrhoids: Secondary | ICD-10-CM

## 2022-10-03 DIAGNOSIS — F0280 Dementia in other diseases classified elsewhere without behavioral disturbance: Secondary | ICD-10-CM | POA: Diagnosis not present

## 2022-10-03 DIAGNOSIS — K5901 Slow transit constipation: Secondary | ICD-10-CM | POA: Diagnosis not present

## 2022-10-03 DIAGNOSIS — E890 Postprocedural hypothyroidism: Secondary | ICD-10-CM

## 2022-10-03 DIAGNOSIS — E871 Hypo-osmolality and hyponatremia: Secondary | ICD-10-CM | POA: Diagnosis not present

## 2022-10-03 DIAGNOSIS — M25551 Pain in right hip: Secondary | ICD-10-CM

## 2022-10-03 DIAGNOSIS — M810 Age-related osteoporosis without current pathological fracture: Secondary | ICD-10-CM

## 2022-10-03 DIAGNOSIS — G301 Alzheimer's disease with late onset: Secondary | ICD-10-CM | POA: Diagnosis not present

## 2022-10-03 DIAGNOSIS — N3946 Mixed incontinence: Secondary | ICD-10-CM

## 2022-10-03 NOTE — Progress Notes (Signed)
Location:   [facility] Room Number: 24 Place of Service:  ALF [phone]) Provider:  Veleta Miners, MD  Virgie Dad, MD  Patient Care Team: Virgie Dad, MD as PCP - General (Internal Medicine) Reynold Bowen, MD as Consulting Physician (Endocrinology)  Extended Emergency Contact Information Primary Emergency Contact: Kerwin,Gordon Address: [address]          [place], [place] [address] [name] of [place] Phone: [phone] Mobile Phone: [phone] Relation: Spouse Secondary Emergency Contact: [name],[name] Address: [address]          [place], [place] [address] [name] of [place] Phone: [phone] Work Phone: [phone] Mobile Phone: [phone] Relation: Son  Code Status:  DNR Goals of care: Advanced Directive information    10/03/2022    4:10 PM  Advanced Directives  Does Patient Have a Medical Advance Directive? Yes  Type of Advance Directive [place]  Does patient want to make changes to medical advance directive? No - Patient declined  Copy of [place] in Chart? Yes - validated most recent copy scanned in chart (See row information)  Pre-existing out of facility DNR order (yellow form or pink MOST form) Pink MOST form placed in chart (order not valid for inpatient use)     Chief Complaint  Patient presents with   Medical Management of Chronic Issues    Routine follow up   Quality Metric Gaps    Medicare annual wellness   Immunizations    Flu vaccine, COVID vaccine    HPI:  Pt is a 88 y.o. female seen today for medical management of chronic diseases.    Lives in [place] with her husband  Patient has h/o Hypertension, Hypothyroidism, Insomnia and Cognitive impairment. Also Has h/o Skin Cancer and Thyroid Cancer  Acute issues Urinary incontinence mixed Continues to be her problem has failed pessary. 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  Patient is walking with a walker but dragging her right hip.  They are now using wheelchair for long distance Hyponatremia Her HCTZ was DC'd and repeat BMP shows stable sodium Hemorrhoids with constipation Wt Readings from Last 3 Encounters:  10/03/22 165 lb (74.8 kg)  09/20/22 165 lb 6.4 oz (75 kg)  07/23/22 164 lb (74.4 kg)    Past Medical History:  Diagnosis Date   Back pain    Breast cancer ([facility])    Cancer ([facility])    left breaset mastectomy    Candidiasis of skin and nails    Cellulitis and abscess of upper arm and forearm    Heartburn    Hemorrhoids 08/20/2016   History of breast cancer 2000   History left mastectomy   Hurthle cell adenocarcinoma ([place]) 08/20/2016   Hypertension    Insomnia    Knee pain, bilateral 2014   Lymphedema    Left arm following mastectomy   Memory impairment    Memory loss    Migraine 05/16/2012   Migraine without aura, without mention of intractable migraine without mention of status migrainosus    patient denies at preop of 08/08/16    Other and unspecified hyperlipidemia    Other bursitis disorders    Other infective bursitis, left shoulder    Pain in joint, ankle and foot    Pain in joint, hand    Pain in joint, lower leg  Pain in joint, shoulder region    Pain in joint, shoulder region 04/23/2016   Pain in limb    Rectal bleeding 08/20/2016   Senile osteoporosis    Symptomatic menopausal or female climacteric states    Syncope and collapse    Unspecified hypertensive heart disease without heart failure 1995   Unspecified hypothyroidism    Unspecified polyarthropathy or polyarthritis, site unspecified    Unspecified urinary incontinence    Unspecified visual loss    Xerophthalmia    Past Surgical History:  Procedure Laterality Date   ABDOMINAL HYSTERECTOMY     BILATERAL OOPHORECTOMY  1981   BREAST RECONSTRUCTION Left 01/2000    BREAST REDUCTION SURGERY Right 01/2000   CATARACT EXTRACTION Right 09/04/2005   COLONOSCOPY  01/2004   MASTECTOMY Left 10/1999   THYROID LOBECTOMY Left 08/14/2016   Procedure: NEAR TOTAL THYROIDECTOMY;  Surgeon: Johnathan Hausen, MD;  Location: [facility];  Service: General;  Laterality: Left;   TOTAL ABDOMINAL HYSTERECTOMY W/ BILATERAL SALPINGOOPHORECTOMY  1981   TOTAL KNEE ARTHROPLASTY Right 2014    Allergies  Allergen Reactions   Donepezil Hcl     Body pain and stiffness    Namenda [Memantine Hcl]      Caused body pain    Sulfa Antibiotics    Sulfamethoxazole Rash    Allergies as of 10/03/2022       Reactions   Donepezil Hcl    Body pain and stiffness   Namenda [memantine Hcl]     Caused body pain    Sulfa Antibiotics    Sulfamethoxazole Rash        Medication List        Accurate as of October 03, 2022  4:22 PM. If you have any questions, ask your nurse or doctor.          amLODipine 2.5 MG tablet Commonly known as: NORVASC Takes 7.'5mg'$  po qd   CENTRUM SILVER PO Take 1 tablet by mouth daily.   Cholecalciferol 125 MCG (5000 UT) capsule Take 5,000 Units by mouth daily.   levothyroxine 112 MCG tablet Commonly known as: SYNTHROID Take 1 tablet (112 mcg total) by mouth daily before breakfast.   lidocaine 5 % Commonly known as: Lidoderm Place 1 patch onto the skin daily. Remove & Discard patch within 12 hours or as directed by MD   losartan-hydrochlorothiazide 100-12.5 MG tablet Commonly known as: HYZAAR Take 1 tablet by mouth once daily   OMEGA-3 CF PO Take 520 mg by mouth daily.   oxybutynin 15 MG 24 hr tablet Commonly known as: DITROPAN XL Take 15 mg by mouth daily.   Polyethyl Glycol-Propyl Glycol 0.4-0.3 % Soln Place 2 drops into both eyes daily as needed (dry eyes).   sennosides-docusate sodium 8.6-50 MG tablet Commonly known as: SENOKOT-S Take 1 tablet by mouth daily.   triamcinolone 0.025 % cream Commonly known as: KENALOG Apply 1  application. topically 2 (two) times daily.   zinc oxide 20 % ointment Apply 1 application. topically as needed for irritation.        Review of Systems  Constitutional:  Positive for activity change. Negative for appetite change.  HENT: Negative.    Respiratory:  Negative for cough and shortness of breath.   Cardiovascular:  Negative for leg swelling.  Gastrointestinal:  Negative for constipation.  Genitourinary:  Positive for frequency and urgency.  Musculoskeletal:  Positive for arthralgias, gait problem and myalgias.  Skin: Negative.   Neurological:  Positive for weakness. Negative for dizziness.  Psychiatric/Behavioral:  Positive for confusion. Negative for dysphoric mood and sleep disturbance.     Immunization History  Administered Date(s) Administered   Influenza, High Dose Seasonal PF 09/17/2017, 09/10/2018, 09/22/2019, 09/13/2021   Influenza, Quadrivalent, Recombinant, Inj, Pf 08/29/2020   Influenza,inj,Quad PF,6+ Mos 09/10/2018   Influenza-Unspecified 08/10/2013, 09/07/2015, 09/13/2016   Moderna Covid-19 Vaccine Bivalent Booster 58yr & up 05/22/2022   Moderna SARS-COV2 Booster Vaccination 05/16/2021   Moderna Sars-Covid-2 Vaccination 12/13/2019, 01/10/2020, 10/23/2020   PFIZER(Purple Top)SARS-COV-2 Vaccination 08/29/2021   Pneumococcal Conjugate-13 06/08/2017, 11/02/2018   Pneumococcal Polysaccharide-23 12/09/2002, 06/08/2017, 10/22/2017, 11/02/2018   Pneumococcal-Unspecified 06/08/2017, 11/02/2018   Tdap 02/21/2014, 10/29/2017   Zoster Recombinat (Shingrix) 07/18/2017, 01/10/2018   Zoster, Live 07/18/2017, 01/10/2018   Pertinent  Health Maintenance Due  Topic Date Due   INFLUENZA VACCINE  07/09/2022   DEXA SCAN  Completed      09/06/2020    8:35 AM 09/06/2020    8:39 AM 03/07/2021    1:03 PM 09/05/2021    8:54 AM 02/18/2022    2:20 PM  Fall Risk  Falls in the past year? 0 0 0 0   Was there an injury with Fall?     0  Patient Fall Risk Level    Low fall  risk Moderate fall risk  Patient at Risk for Falls Due to     History of fall(s);Impaired balance/gait;Impaired mobility  Fall risk Follow up    Falls evaluation completed Falls evaluation completed;Education provided;Falls prevention discussed   Functional Status Survey:    Vitals:   10/03/22 1608  BP: 129/76  Pulse: 76  Resp: 16  Temp: (!) 97.2 F (36.2 C)  SpO2: 99%  Weight: 165 lb (74.8 kg)  Height: '5\' 4"'$  (1.626 m)   Body mass index is 28.32 kg/m. Physical Exam Vitals reviewed.  Constitutional:      Appearance: Normal appearance.  HENT:     Head: Normocephalic.     Nose: Nose normal.     Mouth/Throat:     Mouth: Mucous membranes are moist.     Pharynx: Oropharynx is clear.  Eyes:     Pupils: Pupils are equal, round, and reactive to light.  Cardiovascular:     Rate and Rhythm: Normal rate and regular rhythm.     Pulses: Normal pulses.     Heart sounds: Normal heart sounds. No murmur heard. Pulmonary:     Effort: Pulmonary effort is normal.     Breath sounds: Normal breath sounds.  Abdominal:     General: Abdomen is flat. Bowel sounds are normal.     Palpations: Abdomen is soft.  Genitourinary:    Comments: External Hemorrhoids One of them is inflamed Musculoskeletal:        General: No swelling.     Cervical back: Neck supple.     Comments: Walks with her walker but drags her Right leg now due to discomfort  Skin:    General: Skin is warm.  Neurological:     General: No focal deficit present.     Mental Status: She is alert.  Psychiatric:        Mood and Affect: Mood normal.        Thought Content: Thought content normal.     Labs reviewed: No results for input(s): "NA", "K", "CL", "CO2", "GLUCOSE", "BUN", "CREATININE", "CALCIUM", "MG", "PHOS" in the last 8760 hours. No results for input(s): "AST", "ALT", "ALKPHOS", "BILITOT", "PROT", "ALBUMIN" in the last 8760 hours. No results for input(s): "WBC", "NEUTROABS", "HGB", "HCT", "MCV", "PLT"  in the last  8760 hours. Lab Results  Component Value Date   TSH 2.30 02/28/2021   No results found for: "HGBA1C" Lab Results  Component Value Date   CHOL 190 07/26/2020   HDL 56 07/26/2020   LDLCALC 109 (H) 07/26/2020   TRIG 139 07/26/2020   CHOLHDL 3.4 07/26/2020    Significant Diagnostic Results in last 30 days:  No results found.  Assessment/Plan 1. Hyponatremia Off HCTZ and it is stable now  2. Primary hypertension Controlled on Losartan and Norvasc  3. Mixed stress and urge urinary incontinence On Ditropan Continues to have issue  4. Mixed hyperlipidemia Did not tolerate statins  5. Postoperative hypothyroidism TSH normal in 01/23  6.Osteopenia DEXA done in 10/22   7. Slow transit constipation Add Senokot QD with Colace  8. Hemorrhoids, unspecified hemorrhoid type Anusol BID for 7 days then PRN  9. Right hip pain Ortho Referal made Tylenol Does not want anything more  10. Late onset Alzheimer's 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 ([place]) Failed Aricept and Namenda due to Side effects developed rash due to [place] Patch Plan to stay in [place] for supervision with Meds and Showers  Labs done recently were all normal  Family/ staff Communication:   Labs/tests ordered:

## 2022-10-14 DIAGNOSIS — M25559 Pain in unspecified hip: Secondary | ICD-10-CM | POA: Diagnosis not present

## 2022-10-15 ENCOUNTER — Encounter: Payer: Self-pay | Admitting: Adult Health

## 2022-10-15 ENCOUNTER — Other Ambulatory Visit: Payer: Self-pay | Admitting: Adult Health

## 2022-10-15 ENCOUNTER — Non-Acute Institutional Stay: Payer: PPO | Admitting: Adult Health

## 2022-10-15 DIAGNOSIS — G8929 Other chronic pain: Secondary | ICD-10-CM

## 2022-10-15 DIAGNOSIS — M545 Low back pain, unspecified: Secondary | ICD-10-CM | POA: Diagnosis not present

## 2022-10-15 MED ORDER — VITAMIN C 1000 MG PO TABS: 1000.0000 mg | ORAL_TABLET | Freq: Every day | ORAL | 0 refills | Status: DC

## 2022-10-15 MED ORDER — DM-GUAIFENESIN ER 30-600 MG PO TB12: 1.0000 | ORAL_TABLET | Freq: Two times a day (BID) | ORAL | 0 refills | Status: DC

## 2022-10-15 NOTE — Progress Notes (Signed)
Location:  [facility] Room Number: AL 24-A Place of Service:  ALF [phone]) Provider: Durenda Age NP   Virgie Dad, MD  Patient Care Team: Virgie Dad, MD as PCP - General (Internal Medicine) Reynold Bowen, MD as Consulting Physician (Endocrinology)  Extended Emergency Contact Information Primary Emergency Contact: Remache,Gordon Address: [address]          [place], [place] [address] [name] of [place] Phone: [phone] Mobile Phone: [phone] Relation: Spouse Secondary Emergency Contact: [name],[name] Address: [address]          [place], [place] [address] [name] of [place] Phone: [phone] Work Phone: [phone] Mobile Phone: [phone] Relation: Son  Code Status:DNR   Goals of care: Advanced Directive information    10/15/2022    2:56 PM  Advanced Directives  Type of Advance Directive Healthcare Power of [place];Living will;Out of facility DNR (pink MOST or yellow form)  Copy of [place] in Chart? Yes - validated most recent copy scanned in chart (See row information)  Pre-existing out of facility DNR order (yellow form or pink MOST form) Yellow form placed in chart (order not valid for inpatient use);Pink Most/Yellow Form available - Physician notified to receive inpatient order     Chief Complaint  Patient presents with   Acute Visit    Lower back pain    HPI:  Pt is a 88 y.o. female seen today for an acute visit for lower back pain. She has a PMH of hypertension, hypothyroidism, insomnia and dementia. She is a long-term care resident of [facility]. She was seen in her room while sitting up on her bed with husband at bedside. She complained of right lower back pain, 5/10. She has been having lower back pack for more than 6 months. She uses Aspercreme Lidocaine external patch to lower back daily.   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   Hypertension    Insomnia    Knee pain, bilateral 2014   Lymphedema    Left arm following mastectomy   Memory impairment    Memory loss    Migraine 05/16/2012   Migraine without aura, without mention of intractable migraine without mention of status migrainosus    patient denies at preop of 08/08/16    Other and unspecified hyperlipidemia    Other bursitis disorders    Other infective bursitis, left shoulder    Pain in joint, ankle and foot    Pain in joint, hand    Pain in joint, lower leg    Pain in joint, shoulder region    Pain in joint, shoulder region 04/23/2016   Pain in limb    Rectal bleeding 08/20/2016   Senile osteoporosis    Symptomatic menopausal or female climacteric states    Syncope and collapse    Unspecified hypertensive heart disease without heart failure 1995   Unspecified hypothyroidism    Unspecified polyarthropathy or polyarthritis, site unspecified    Unspecified urinary incontinence    Unspecified visual loss    Xerophthalmia  Past Surgical History:  Procedure Laterality Date   ABDOMINAL HYSTERECTOMY     BILATERAL OOPHORECTOMY  1981   BREAST RECONSTRUCTION Left 01/2000   BREAST REDUCTION SURGERY Right 01/2000   CATARACT EXTRACTION Right 09/04/2005   COLONOSCOPY  01/2004   MASTECTOMY Left 10/1999   THYROID LOBECTOMY Left 08/14/2016   Procedure: NEAR TOTAL THYROIDECTOMY;  Surgeon: Johnathan Hausen, MD;  Location: [facility];  Service: General;  Laterality: Left;   TOTAL ABDOMINAL HYSTERECTOMY W/ BILATERAL SALPINGOOPHORECTOMY  1981   TOTAL KNEE ARTHROPLASTY Right 2014    Allergies  Allergen Reactions   Donepezil Hcl     Body pain and stiffness     Namenda [Memantine Hcl]      Caused body pain    Sulfa Antibiotics    Sulfamethoxazole Rash    Allergies as of 10/15/2022       Reactions   Donepezil Hcl    Body pain and stiffness   Namenda [memantine Hcl]     Caused body pain    Sulfa Antibiotics    Sulfamethoxazole Rash        Medication List        Accurate as of October 15, 2022  4:02 PM. If you have any questions, ask your nurse or doctor.          acetaminophen 325 MG tablet Commonly known as: TYLENOL Take 325 mg by mouth 2 (two) times daily. Give 2 tablets (total of 650 mg) for pain.   amLODipine 2.5 MG tablet Commonly known as: NORVASC Takes 7.'5mg'$  po qd   Biofreeze 4 % Gel Generic drug: Menthol (Topical Analgesic) Apply 1 Application topically 3 (three) times daily. Apply to lower legs topically as needed for pain.   CENTRUM SILVER PO Take 1 tablet by mouth daily.   Cholecalciferol 125 MCG (5000 UT) capsule Take 5,000 Units by mouth daily.   HM Lidocaine Patch 4 % Generic drug: lidocaine Place 1 patch onto the skin daily. Apply to lower back topically one time a day. What changed: Another medication with the same name was removed. Continue taking this medication, and follow the directions you see here. Changed by: Durenda Age, NP   levothyroxine 112 MCG tablet Commonly known as: SYNTHROID Take 112 mcg by mouth daily before breakfast. What changed: Another medication with the same name was removed. Continue taking this medication, and follow the directions you see here. Changed by: Durenda Age, NP   losartan 100 MG tablet Commonly known as: COZAAR Take 100 mg by mouth daily.   OMEGA-3 CF PO Take 1,000 mg by mouth every morning. With food   oxybutynin 15 MG 24 hr tablet Commonly known as: DITROPAN XL Take 15 mg by mouth daily.   Polyethyl Glycol-Propyl Glycol 0.4-0.3 % Soln Place 2 drops into both eyes daily as needed (dry eyes).   sennosides-docusate sodium 8.6-50 MG  tablet Commonly known as: SENOKOT-S Take 1 tablet by mouth daily.   triamcinolone 0.025 % cream Commonly known as: KENALOG Apply 1 application  topically as needed (Apply to affected area topically).   zinc oxide 20 % ointment Apply 1 application  topically as needed for irritation. Apply to buttocks topically after every incontinent episode for redness        Review of Systems  Constitutional:  Negative for appetite change, chills, fatigue and fever.  HENT:  Negative for congestion, hearing loss, rhinorrhea and sore throat.   Eyes: Negative.   Respiratory:  Negative for cough, shortness of breath and wheezing.  Cardiovascular:  Negative for chest pain, palpitations and leg swelling.  Gastrointestinal:  Negative for abdominal pain, constipation, diarrhea, nausea and vomiting.  Genitourinary:  Negative for dysuria.  Musculoskeletal:  Positive for back pain. Negative for arthralgias and myalgias.  Skin:  Negative for color change, rash and wound.  Neurological:  Negative for dizziness, weakness and headaches.  Psychiatric/Behavioral:  Negative for behavioral problems. The patient is not nervous/anxious.     Immunization History  Administered Date(s) Administered   Influenza, High Dose Seasonal PF 09/17/2017, 09/10/2018, 09/22/2019, 09/13/2021   Influenza, Quadrivalent, Recombinant, Inj, Pf 08/29/2020   Influenza,inj,Quad PF,6+ Mos 09/10/2018   Influenza-Unspecified 08/10/2013, 09/07/2015, 09/13/2016   Moderna Covid-19 Vaccine Bivalent Booster 58yr & up 05/22/2022   Moderna SARS-COV2 Booster Vaccination 05/16/2021   Moderna Sars-Covid-2 Vaccination 12/13/2019, 01/10/2020, 10/23/2020   PFIZER(Purple Top)SARS-COV-2 Vaccination 08/29/2021   Pneumococcal Conjugate-13 06/08/2017, 11/02/2018   Pneumococcal Polysaccharide-23 12/09/2002, 06/08/2017, 10/22/2017, 11/02/2018   Pneumococcal-Unspecified 06/08/2017, 11/02/2018   Tdap 02/21/2014, 10/29/2017   Zoster Recombinat (Shingrix)  07/18/2017, 01/10/2018   Zoster, Live 07/18/2017, 01/10/2018   Pertinent  Health Maintenance Due  Topic Date Due   INFLUENZA VACCINE  07/09/2022   DEXA SCAN  Completed      09/06/2020    8:35 AM 09/06/2020    8:39 AM 03/07/2021    1:03 PM 09/05/2021    8:54 AM 02/18/2022    2:20 PM  Fall Risk  Falls in the past year? 0 0 0 0   Was there an injury with Fall?     0  Patient Fall Risk Level    Low fall risk Moderate fall risk  Patient at Risk for Falls Due to     History of fall(s);Impaired balance/gait;Impaired mobility  Fall risk Follow up    Falls evaluation completed Falls evaluation completed;Education provided;Falls prevention discussed   Functional Status Survey:    Vitals:   10/15/22 1447  BP: (!) 141/82  Pulse: 80  Temp: 97.8 F (36.6 C)  SpO2: 95%  Weight: 167 lb 12.8 oz (76.1 kg)  Height: '5\' 4"'$  (1.626 m)   Body mass index is 28.8 kg/m. Physical Exam Constitutional:      General: She is not in acute distress.    Appearance: Normal appearance.  HENT:     Head: Normocephalic and atraumatic.     Nose: Nose normal.     Mouth/Throat:     Mouth: Mucous membranes are moist.  Eyes:     Conjunctiva/sclera: Conjunctivae normal.  Cardiovascular:     Rate and Rhythm: Normal rate and regular rhythm.  Pulmonary:     Effort: Pulmonary effort is normal.     Breath sounds: Normal breath sounds.  Abdominal:     General: Bowel sounds are normal.     Palpations: Abdomen is soft.  Musculoskeletal:        General: Normal range of motion.     Cervical back: Normal range of motion.  Skin:    General: Skin is warm and dry.  Neurological:     Mental Status: She is alert. Mental status is at baseline.  Psychiatric:        Mood and Affect: Mood normal.        Behavior: Behavior normal.        Thought Content: Thought content normal.        Judgment: Judgment normal.     Labs reviewed: Recent Labs    07/29/22 0000 09/19/22 0000 09/23/22 0000  NA 132* 129* 131*  K  4.1 4.3 4.2  CL 97* 94* 97*  CO2 24* 28* 28*  BUN '13 15 13  '$ CREATININE 0.6 0.6 0.7  CALCIUM 9.9 9.6 9.3   Recent Labs    06/03/22 0000 07/26/22 0000  AST 13 15  ALT 9 12  ALKPHOS 67 62  ALBUMIN 3.8 3.9   Recent Labs    06/03/22 0000 07/26/22 0000  WBC 5.8 6.0  NEUTROABS 3,057.00 3,372.00  HGB 12.8 12.8  HCT 38 37  PLT 376 364   Lab Results  Component Value Date   TSH 4.50 07/26/2022   No results found for: "HGBA1C" Lab Results  Component Value Date   CHOL 190 07/26/2020   HDL 56 07/26/2020   LDLCALC 109 (H) 07/26/2020   TRIG 139 07/26/2020   CHOLHDL 3.4 07/26/2020    Significant Diagnostic Results in last 30 days:  No results found.  Assessment/Plan  1. Chronic right-sided low back pain without sciatica -  increase Asprecreme Lidocaine external patch 4% to lower back Q AM and HS -   Fall precautions   Family/ staff Communication: Discussed plan of care with resident and charge nurse.  Labs/tests ordered:   None

## 2022-10-17 ENCOUNTER — Non-Acute Institutional Stay: Payer: PPO | Admitting: Internal Medicine

## 2022-10-17 ENCOUNTER — Encounter: Payer: Self-pay | Admitting: Internal Medicine

## 2022-10-17 DIAGNOSIS — I10 Essential (primary) hypertension: Secondary | ICD-10-CM

## 2022-10-17 DIAGNOSIS — N3946 Mixed incontinence: Secondary | ICD-10-CM | POA: Diagnosis not present

## 2022-10-17 DIAGNOSIS — R2681 Unsteadiness on feet: Secondary | ICD-10-CM

## 2022-10-17 DIAGNOSIS — E871 Hypo-osmolality and hyponatremia: Secondary | ICD-10-CM

## 2022-10-17 DIAGNOSIS — G301 Alzheimer's disease with late onset: Secondary | ICD-10-CM | POA: Diagnosis not present

## 2022-10-17 DIAGNOSIS — M25551 Pain in right hip: Secondary | ICD-10-CM | POA: Diagnosis not present

## 2022-10-17 DIAGNOSIS — F0280 Dementia in other diseases classified elsewhere without behavioral disturbance: Secondary | ICD-10-CM

## 2022-10-17 NOTE — Progress Notes (Signed)
Location:  [facility] of Service:  ALF [phone]) Provider:  Virgie Dad, MD   Virgie Dad, MD  Patient Care Team: Virgie Dad, MD as PCP - General (Internal Medicine) Reynold Bowen, MD as Consulting Physician (Endocrinology)  Extended Emergency Contact Information Primary Emergency Contact: [facility] Address: [address]          [place], [place] [address] [name] of [place] Phone: [phone] Mobile Phone: [phone] Relation: Spouse Secondary Emergency Contact: [facility] Address: [address]          [place], [place] [address] [name] of [place] Phone: [phone] Work Phone: [phone] Mobile Phone: [phone] Relation: Son  Code Status:  DNR Goals of care: Advanced Directive information    10/17/2022    3:53 PM  Advanced Directives  Does Patient Have a Medical Advance Directive? Yes  Type of Advance Directive Living will;Out of facility DNR (pink MOST or yellow form)  Does patient want to make changes to medical advance directive? No - Patient declined     Chief Complaint  Patient presents with   Acute Visit    Patient has been having some hip pain   Immunizations    Discussed the need for covid vaccine    HPI:  Pt is a 88 y.o. female seen today for an acute visit for Hip pain and Unable to walk and do her ADLS  Lives in [place] with her husband   Patient has h/o Hypertension, Hypothyroidism, Insomnia and Cognitive impairment. 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  Patient is walking with a walker but dragging her right hip.  They are now using wheelchair for long distance  She does have appointment with Ortho but she is not able to walk and do her transfers due to weakness in her legs and needing more help with the staff  Had a fall more ground level recently Xrays done did   not show any fractures Husband wants her transferred to Skilled for therapy She also has had cognitive decline and he is not able to help her   Past Medical History:  Diagnosis Date   Back pain    Breast cancer ([place])    Cancer ([place])    left breaset mastectomy    Candidiasis of skin and nails    Cellulitis and abscess of upper arm and forearm    Heartburn    Hemorrhoids 08/20/2016   History of breast cancer 2000   History left mastectomy   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   Hypertension    Insomnia    Knee pain, bilateral 2014   Lymphedema    Left arm following mastectomy   Memory impairment    Memory loss    Migraine 05/16/2012   Migraine without aura, without mention of intractable migraine without mention of status migrainosus    patient denies at preop of 08/08/16    Other and unspecified hyperlipidemia    Other bursitis disorders    Other infective bursitis, left shoulder    Pain in joint, ankle and foot    Pain in joint, hand    Pain in joint, lower leg    Pain in joint, shoulder region    Pain in joint, shoulder region 04/23/2016   Pain in limb    Rectal bleeding 08/20/2016   Senile osteoporosis    Symptomatic menopausal  or female climacteric states    Syncope and collapse    Unspecified hypertensive heart disease without heart failure 1995   Unspecified hypothyroidism    Unspecified polyarthropathy or polyarthritis, site unspecified    Unspecified urinary incontinence    Unspecified visual loss    Xerophthalmia    Past Surgical History:  Procedure Laterality Date   ABDOMINAL HYSTERECTOMY     BILATERAL OOPHORECTOMY  1981   BREAST RECONSTRUCTION Left 01/2000   BREAST REDUCTION SURGERY Right 01/2000   CATARACT EXTRACTION Right 09/04/2005   COLONOSCOPY  01/2004   MASTECTOMY Left 10/1999   THYROID LOBECTOMY Left 08/14/2016   Procedure: NEAR TOTAL THYROIDECTOMY;  Surgeon: Johnathan Hausen, MD;  Location: [facility];  Service: General;  Laterality: Left;    TOTAL ABDOMINAL HYSTERECTOMY W/ BILATERAL SALPINGOOPHORECTOMY  1981   TOTAL KNEE ARTHROPLASTY Right 2014    Allergies  Allergen Reactions   Donepezil Hcl     Body pain and stiffness    Namenda [Memantine Hcl]      Caused body pain    Sulfa Antibiotics    Sulfamethoxazole Rash    Outpatient Encounter Medications as of 10/17/2022  Medication Sig   acetaminophen (TYLENOL) 325 MG tablet Take 325 mg by mouth 2 (two) times daily. Give 2 tablets (total of 650 mg) for pain.   Cholecalciferol 5000 units capsule Take 5,000 Units by mouth daily.   levothyroxine (SYNTHROID) 112 MCG tablet Take 112 mcg by mouth daily before breakfast.   lidocaine (HM LIDOCAINE PATCH) 4 % Place 1 patch onto the skin daily. Apply to lower back topically one time a day.   losartan (COZAAR) 100 MG tablet Take 100 mg by mouth daily.   Menthol, Topical Analgesic, (BIOFREEZE) 4 % GEL Apply 1 Application topically 3 (three) times daily. Apply to lower legs topically as needed for pain.   Omega-3 Fatty Acids (OMEGA-3 CF PO) Take 1,000 mg by mouth every morning. With food   oxybutynin (DITROPAN XL) 15 MG 24 hr tablet Take 15 mg by mouth daily.   Polyethyl Glycol-Propyl Glycol 0.4-0.3 % SOLN Place 2 drops into both eyes daily as needed (dry eyes).    sennosides-docusate sodium (SENOKOT-S) 8.6-50 MG tablet Take 1 tablet by mouth daily.   triamcinolone (KENALOG) 0.025 % cream Apply 1 application  topically as needed (Apply to affected area topically).   zinc oxide 20 % ointment Apply 1 application  topically as needed for irritation. Apply to buttocks topically after every incontinent episode for redness   amLODipine (NORVASC) 2.5 MG tablet Takes 7.'5mg'$  po qd (Patient taking differently: Take 2.5 mg by mouth daily. Give 3 tablets(7.'5mg'$ ) by mouth in the morning)   Multiple Vitamins-Minerals (CENTRUM SILVER PO) Take 1 tablet by mouth daily.    No facility-administered encounter medications on file as of 10/17/2022.    Review of  Systems  Constitutional:  Negative for activity change and appetite change.  HENT: Negative.    Respiratory:  Negative for cough and shortness of breath.   Cardiovascular:  Negative for leg swelling.  Gastrointestinal:  Negative for constipation.  Genitourinary:  Positive for frequency and urgency.  Musculoskeletal:  Positive for arthralgias, gait problem and myalgias.  Skin: Negative.   Neurological:  Negative for dizziness and weakness.  Psychiatric/Behavioral:  Positive for confusion. Negative for dysphoric mood and sleep disturbance.     Immunization History  Administered Date(s) Administered   Fluad Quad(high Dose 65+) 10/02/2022   Influenza, High Dose Seasonal PF 09/17/2017, 09/10/2018, 09/22/2019, 09/13/2021  Influenza, Quadrivalent, Recombinant, Inj, Pf 08/29/2020   Influenza,inj,Quad PF,6+ Mos 09/10/2018   Influenza-Unspecified 08/10/2013, 09/07/2015, 09/13/2016   Moderna Covid-19 Vaccine Bivalent Booster 64yr & up 05/22/2022   Moderna SARS-COV2 Booster Vaccination 05/16/2021   Moderna Sars-Covid-2 Vaccination 12/13/2019, 01/10/2020, 10/23/2020   PFIZER(Purple Top)SARS-COV-2 Vaccination 08/29/2021   Pneumococcal Conjugate-13 06/08/2017, 11/02/2018   Pneumococcal Polysaccharide-23 12/09/2002, 06/08/2017, 10/22/2017, 11/02/2018   Pneumococcal-Unspecified 06/08/2017, 11/02/2018   Tdap 02/21/2014, 10/29/2017   Zoster Recombinat (Shingrix) 07/18/2017, 01/10/2018   Zoster, Live 07/18/2017, 01/10/2018   Pertinent  Health Maintenance Due  Topic Date Due   INFLUENZA VACCINE  Completed   DEXA SCAN  Completed      09/06/2020    8:35 AM 09/06/2020    8:39 AM 03/07/2021    1:03 PM 09/05/2021    8:54 AM 02/18/2022    2:20 PM  Fall Risk  Falls in the past year? 0 0 0 0   Was there an injury with Fall?     0  Patient Fall Risk Level    Low fall risk Moderate fall risk  Patient at Risk for Falls Due to     History of fall(s);Impaired balance/gait;Impaired mobility  Fall risk  Follow up    Falls evaluation completed Falls evaluation completed;Education provided;Falls prevention discussed   Functional Status Survey:    Vitals:   10/17/22 1527  BP: (!) 100/55  Pulse: 60  Resp: 16  Temp: 98 F (36.7 C)  TempSrc: Temporal  SpO2: 95%  Weight: 167 lb 12.8 oz (76.1 kg)  Height: '5\' 4"'$  (1.626 m)   Body mass index is 28.8 kg/m. Physical Exam Vitals reviewed.  Constitutional:      Appearance: Normal appearance.  HENT:     Head: Normocephalic.     Nose: Nose normal.     Mouth/Throat:     Mouth: Mucous membranes are moist.     Pharynx: Oropharynx is clear.  Eyes:     Pupils: Pupils are equal, round, and reactive to light.  Cardiovascular:     Rate and Rhythm: Normal rate and regular rhythm.     Pulses: Normal pulses.     Heart sounds: Normal heart sounds. No murmur heard. Pulmonary:     Effort: Pulmonary effort is normal.     Breath sounds: Normal breath sounds.  Abdominal:     General: Abdomen is flat. Bowel sounds are normal.     Palpations: Abdomen is soft.  Musculoskeletal:        General: No swelling.     Cervical back: Neck supple.  Skin:    General: Skin is warm.  Neurological:     General: No focal deficit present.     Mental Status: She is alert.     Comments: Not able to get up without assist and also needing help with her transfers  Using Wheelchair mainly  Psychiatric:        Mood and Affect: Mood normal.        Thought Content: Thought content normal.     Labs reviewed: Recent Labs    07/29/22 0000 09/19/22 0000 09/23/22 0000  NA 132* 129* 131*  K 4.1 4.3 4.2  CL 97* 94* 97*  CO2 24* 28* 28*  BUN '13 15 13  '$ CREATININE 0.6 0.6 0.7  CALCIUM 9.9 9.6 9.3   Recent Labs    06/03/22 0000 07/26/22 0000  AST 13 15  ALT 9 12  ALKPHOS 67 62  ALBUMIN 3.8 3.9   Recent Labs  06/03/22 0000 07/26/22 0000  WBC 5.8 6.0  NEUTROABS 3,057.00 3,372.00  HGB 12.8 12.8  HCT 38 37  PLT 376 364   Lab Results  Component Value  Date   TSH 4.50 07/26/2022   No results found for: "HGBA1C" Lab Results  Component Value Date   CHOL 190 07/26/2020   HDL 56 07/26/2020   LDLCALC 109 (H) 07/26/2020   TRIG 139 07/26/2020   CHOLHDL 3.4 07/26/2020    Significant Diagnostic Results in last 30 days:  No results found.  Assessment/Plan 1. Unstable gait Needs therapy Transfer to Skilled for more help with ADLS FL 2 signed  2. Right hip pain Ortho appointment pending Tylenol Prn for pain Xray shows Severe Arthritis  3. Hyponatremia Sodium stable  HCTZ discontinued  4. Primary hypertension BP low but usually has been good If consistently low will reduce the dose of Norvasc  5. Mixed stress and urge urinary incontinence On Ditropan Continues to have issue    6. Late onset Alzheimer's 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 ([place]) Failed Aricept and Namenda due to Side effects developed rash due to Exelon Patch Now thinking of staying in Skilled      Family/ staff Communication:   Labs/tests ordered:

## 2022-10-18 DIAGNOSIS — L602 Onychogryphosis: Secondary | ICD-10-CM | POA: Diagnosis not present

## 2022-10-18 DIAGNOSIS — M79671 Pain in right foot: Secondary | ICD-10-CM | POA: Diagnosis not present

## 2022-10-18 DIAGNOSIS — M79672 Pain in left foot: Secondary | ICD-10-CM | POA: Diagnosis not present

## 2022-10-22 ENCOUNTER — Encounter: Payer: Self-pay | Admitting: Adult Health

## 2022-10-22 ENCOUNTER — Non-Acute Institutional Stay (SKILLED_NURSING_FACILITY): Payer: PPO | Admitting: Adult Health

## 2022-10-22 DIAGNOSIS — E890 Postprocedural hypothyroidism: Secondary | ICD-10-CM | POA: Diagnosis not present

## 2022-10-22 DIAGNOSIS — I10 Essential (primary) hypertension: Secondary | ICD-10-CM

## 2022-10-22 DIAGNOSIS — G301 Alzheimer's disease with late onset: Secondary | ICD-10-CM | POA: Diagnosis not present

## 2022-10-22 DIAGNOSIS — N3946 Mixed incontinence: Secondary | ICD-10-CM | POA: Diagnosis not present

## 2022-10-22 DIAGNOSIS — F0280 Dementia in other diseases classified elsewhere without behavioral disturbance: Secondary | ICD-10-CM | POA: Diagnosis not present

## 2022-10-22 NOTE — Progress Notes (Signed)
Location:  [facility] Room Number: [place] of Service:  SNF [phone]) Provider: Durenda Age NP   Virgie Dad, MD  Patient Care Team: Virgie Dad, MD as PCP - General (Internal Medicine) Reynold Bowen, MD as Consulting Physician (Endocrinology)  Extended Emergency Contact Information Primary Emergency Contact: Renegar,Gordon Address: [address]          [place], [place] [address] [name] of [place] Phone: [phone] Mobile Phone: [phone] Relation: Spouse Secondary Emergency Contact: [name],[name] Address: [address]          [place], [place] [address] [name] of [place] Phone: [phone] Work Phone: [phone] Mobile Phone: [phone] Relation: Son  Code Status: DNR  Goals of care: Advanced Directive information    10/22/2022   10:33 AM  Advanced Directives  Does Patient Have a Medical Advance Directive? Yes  Type of Paramedic of [place];Living will;Out of facility DNR (pink MOST or yellow form)  Does patient want to make changes to medical advance directive? No - Patient declined  Copy of [place] in Chart? Yes - validated most recent copy scanned in chart (See row information)  Pre-existing out of facility DNR order (yellow form or pink MOST form) Pink Most/Yellow Form available - Physician notified to receive inpatient order;Yellow form placed in chart (order not valid for inpatient use)     Chief Complaint  Patient presents with   New Admit To SNF    Admission to SNF from ALF    HPI:  Pt is a 88 y.o. female seen today for a follow up visit regarding transfer from [facility] to SNF on 10/21/22. She has a PMH of breast cancer s/p mastectomy, dementia and prolapse of the bladder. She is now needing 2-person assist stand to pivot with gait belt. She was seen in the room with daughter-in-law and son.     Essential  hypertension  _  SBPs ranging from 133 to 145, with outlier 100 and 167, takes Amlodipine  Postoperative hypothyroidism -  takes Levothyroxine  Mixed stress and urge urinary incontinence -  takes Oxybutynin  Late onset Alzheimer's 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 ([place])  -  now needing 2 person assist stand to pivot with gait belt, transferred to SNF    Past Medical History:  Diagnosis Date   Back pain    Breast cancer ([place])    Cancer ([place])    left breaset mastectomy    Candidiasis of skin and nails    Cellulitis and abscess of upper arm and forearm    Heartburn    Hemorrhoids 08/20/2016   History of breast cancer 2000   History left mastectomy   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   Hypertension    Insomnia    Knee pain, bilateral 2014   Lymphedema    Left arm following mastectomy   Memory impairment    Memory loss    Migraine 05/16/2012   Migraine without aura, without mention of intractable migraine without mention of status migrainosus    patient denies at preop of 08/08/16    Other and unspecified hyperlipidemia    Other bursitis disorders    Other infective bursitis, left shoulder    Pain in joint, ankle and foot    Pain in joint, hand    Pain in joint, lower leg    Pain in joint, shoulder region    Pain in joint, shoulder region  04/23/2016   Pain in limb    Rectal bleeding 08/20/2016   Senile osteoporosis    Symptomatic menopausal or female climacteric states    Syncope and collapse    Unspecified hypertensive heart disease without heart failure 1995   Unspecified hypothyroidism    Unspecified polyarthropathy or polyarthritis, site unspecified    Unspecified urinary incontinence    Unspecified visual loss    Xerophthalmia    Past Surgical History:  Procedure Laterality Date   ABDOMINAL HYSTERECTOMY     BILATERAL OOPHORECTOMY  1981   BREAST RECONSTRUCTION Left 01/2000   BREAST REDUCTION SURGERY Right 01/2000   CATARACT EXTRACTION Right  09/04/2005   COLONOSCOPY  01/2004   MASTECTOMY Left 10/1999   THYROID LOBECTOMY Left 08/14/2016   Procedure: NEAR TOTAL THYROIDECTOMY;  Surgeon: Johnathan Hausen, MD;  Location: [facility];  Service: General;  Laterality: Left;   TOTAL ABDOMINAL HYSTERECTOMY W/ BILATERAL SALPINGOOPHORECTOMY  1981   TOTAL KNEE ARTHROPLASTY Right 2014    Allergies  Allergen Reactions   Donepezil Hcl     Body pain and stiffness    Namenda [Memantine Hcl]      Caused body pain    Sulfa Antibiotics    Sulfamethoxazole Rash    Allergies as of 10/22/2022       Reactions   Donepezil Hcl    Body pain and stiffness   Namenda [memantine Hcl]     Caused body pain    Sulfa Antibiotics    Sulfamethoxazole Rash        Medication List        Accurate as of October 22, 2022 11:15 AM. If you have any questions, ask your nurse or doctor.          STOP taking these medications    amLODipine 2.5 MG tablet Commonly known as: NORVASC Stopped by: Durenda Age, NP       TAKE these medications    acetaminophen 325 MG tablet Commonly known as: TYLENOL Take 325 mg by mouth 2 (two) times daily. Give 2 tablets (total of 650 mg) for pain.   amlodipine-atorvastatin 2.5-10 MG tablet Commonly known as: CADUET Take 3 tablets by mouth every morning. (Total of 7.5 mg)   Biofreeze 4 % Gel Generic drug: Menthol (Topical Analgesic) Apply 1 Application topically 3 (three) times daily. Apply to lower legs topically as needed for pain.   CENTRUM SILVER PO Take 1 tablet by mouth daily.   Cholecalciferol 125 MCG (5000 UT) capsule Take 5,000 Units by mouth daily.   HM Lidocaine Patch 4 % Generic drug: lidocaine Place 2 patches onto the skin daily. Apply to lower back topically one time a day.   levothyroxine 112 MCG tablet Commonly known as: SYNTHROID Take 112 mcg by mouth daily before breakfast.   losartan 100 MG tablet Commonly known as: COZAAR Take 100 mg by mouth daily.   OMEGA-3 CF PO Take  1,000 mg by mouth every morning. With food   oxybutynin 15 MG 24 hr tablet Commonly known as: DITROPAN XL Take 15 mg by mouth daily.   Polyethyl Glycol-Propyl Glycol 0.4-0.3 % Soln Place 2 drops into both eyes daily as needed (dry eyes).   Senna S 8.6-50 MG tablet Generic drug: senna-docusate Take 1 tablet by mouth daily. What changed: Another medication with the same name was removed. Continue taking this medication, and follow the directions you see here. Changed by: Durenda Age, NP   senna-docusate 8.6-50 MG tablet Commonly known as: Senokot-S Take 1 tablet  by mouth daily. As needed for constipation (PRN) What changed: Another medication with the same name was removed. Continue taking this medication, and follow the directions you see here. Changed by: Durenda Age, NP   Skin Prep Wipes Misc 2 Applications by Does not apply route 2 (two) times daily. Apply to heels topically for skin care treatment.   triamcinolone 0.025 % cream Commonly known as: KENALOG Apply 1 application  topically as needed (Apply to affected area topically).   zinc oxide 20 % ointment Apply 1 application  topically as needed for irritation. Apply to buttocks topically after every incontinent episode for redness        Review of Systems  Constitutional:  Negative for appetite change, chills, fatigue and fever.  HENT:  Negative for congestion, hearing loss, rhinorrhea and sore throat.   Eyes: Negative.   Respiratory:  Negative for cough, shortness of breath and wheezing.   Cardiovascular:  Negative for chest pain, palpitations and leg swelling.  Gastrointestinal:  Negative for abdominal pain, constipation, diarrhea, nausea and vomiting.  Genitourinary:  Negative for dysuria.  Musculoskeletal:  Negative for arthralgias, back pain and myalgias.  Skin:  Negative for color change, rash and wound.  Neurological:  Negative for dizziness, weakness and headaches.  Psychiatric/Behavioral:   Negative for behavioral problems. The patient is not nervous/anxious.     Immunization History  Administered Date(s) Administered   Fluad Quad(high Dose 65+) 10/02/2022   Influenza, High Dose Seasonal PF 09/17/2017, 09/10/2018, 09/22/2019, 09/13/2021   Influenza, Quadrivalent, Recombinant, Inj, Pf 08/29/2020   Influenza,inj,Quad PF,6+ Mos 09/10/2018   Influenza-Unspecified 08/10/2013, 09/07/2015, 09/13/2016   Moderna Covid-19 Vaccine Bivalent Booster 85yr & up 05/22/2022   Moderna SARS-COV2 Booster Vaccination 05/16/2021   Moderna Sars-Covid-2 Vaccination 12/13/2019, 01/10/2020, 10/23/2020   PFIZER(Purple Top)SARS-COV-2 Vaccination 08/29/2021   Pneumococcal Conjugate-13 06/08/2017, 11/02/2018   Pneumococcal Polysaccharide-23 12/09/2002, 06/08/2017, 10/22/2017, 11/02/2018   Pneumococcal-Unspecified 06/08/2017, 11/02/2018   Tdap 02/21/2014, 10/29/2017   Zoster Recombinat (Shingrix) 07/18/2017, 01/10/2018   Zoster, Live 07/18/2017, 01/10/2018   Pertinent  Health Maintenance Due  Topic Date Due   INFLUENZA VACCINE  Completed   DEXA SCAN  Completed      09/06/2020    8:35 AM 09/06/2020    8:39 AM 03/07/2021    1:03 PM 09/05/2021    8:54 AM 02/18/2022    2:20 PM  Fall Risk  Falls in the past year? 0 0 0 0   Was there an injury with Fall?     0  Patient Fall Risk Level    Low fall risk Moderate fall risk  Patient at Risk for Falls Due to     History of fall(s);Impaired balance/gait;Impaired mobility  Fall risk Follow up    Falls evaluation completed Falls evaluation completed;Education provided;Falls prevention discussed   Functional Status Survey:    Vitals:   10/22/22 1025  BP: 139/87  Pulse: 88  Resp: 18  Temp: 97.7 F (36.5 C)  SpO2: 94%  Weight: 166 lb 6.4 oz (75.5 kg)  Height: '5\' 4"'$  (1.626 m)   Body mass index is 28.56 kg/m. Physical Exam Constitutional:      General: She is not in acute distress.    Appearance: Normal appearance.  HENT:     Head: Normocephalic  and atraumatic.     Nose: Nose normal.     Mouth/Throat:     Mouth: Mucous membranes are moist.  Eyes:     Conjunctiva/sclera: Conjunctivae normal.  Cardiovascular:     Rate and  Rhythm: Normal rate and regular rhythm.  Pulmonary:     Effort: Pulmonary effort is normal.     Breath sounds: Normal breath sounds.  Abdominal:     General: Bowel sounds are normal.     Palpations: Abdomen is soft.  Musculoskeletal:        General: Normal range of motion.     Cervical back: Normal range of motion.  Skin:    General: Skin is warm and dry.  Neurological:     Mental Status: She is alert. Mental status is at baseline. She is disoriented.  Psychiatric:        Mood and Affect: Mood normal.        Behavior: Behavior normal.        Thought Content: Thought content normal.        Judgment: Judgment normal.     Labs reviewed: Recent Labs    07/29/22 0000 09/19/22 0000 09/23/22 0000  NA 132* 129* 131*  K 4.1 4.3 4.2  CL 97* 94* 97*  CO2 24* 28* 28*  BUN '13 15 13  '$ CREATININE 0.6 0.6 0.7  CALCIUM 9.9 9.6 9.3   Recent Labs    06/03/22 0000 07/26/22 0000  AST 13 15  ALT 9 12  ALKPHOS 67 62  ALBUMIN 3.8 3.9   Recent Labs    06/03/22 0000 07/26/22 0000  WBC 5.8 6.0  NEUTROABS 3,057.00 3,372.00  HGB 12.8 12.8  HCT 38 37  PLT 376 364   Lab Results  Component Value Date   TSH 4.50 07/26/2022   No results found for: "HGBA1C" Lab Results  Component Value Date   CHOL 190 07/26/2020   HDL 56 07/26/2020   LDLCALC 109 (H) 07/26/2020   TRIG 139 07/26/2020   CHOLHDL 3.4 07/26/2020    Significant Diagnostic Results in last 30 days:  No results found.  Assessment/Plan  1. Essential hypertension -Blood pressure well controlled Continue current medication  2. Postoperative hypothyroidism Lab Results  Component Value Date   TSH 4.50 07/26/2022   -  continue Levothyroxine  3. Mixed stress and urge urinary incontinence -  continue Oxybutynin  4. Late onset  Alzheimer's 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 ([place]) -  now needing 2-person assist stand to pivot with gait belt -  continue supportive care -  fall precautions   Family/ staff Communication: discussed plan of care with charge nurse.  Labs/tests ordered:   None

## 2022-10-23 DIAGNOSIS — R2681 Unsteadiness on feet: Secondary | ICD-10-CM | POA: Diagnosis not present

## 2022-10-23 DIAGNOSIS — Z9181 History of falling: Secondary | ICD-10-CM | POA: Diagnosis not present

## 2022-10-23 DIAGNOSIS — M6281 Muscle weakness (generalized): Secondary | ICD-10-CM | POA: Diagnosis not present

## 2022-10-24 ENCOUNTER — Encounter: Payer: Self-pay | Admitting: Internal Medicine

## 2022-10-24 ENCOUNTER — Non-Acute Institutional Stay (SKILLED_NURSING_FACILITY): Payer: PPO | Admitting: Internal Medicine

## 2022-10-24 DIAGNOSIS — R2681 Unsteadiness on feet: Secondary | ICD-10-CM

## 2022-10-24 DIAGNOSIS — F0280 Dementia in other diseases classified elsewhere without behavioral disturbance: Secondary | ICD-10-CM | POA: Diagnosis not present

## 2022-10-24 DIAGNOSIS — E782 Mixed hyperlipidemia: Secondary | ICD-10-CM

## 2022-10-24 DIAGNOSIS — K649 Unspecified hemorrhoids: Secondary | ICD-10-CM

## 2022-10-24 DIAGNOSIS — N3946 Mixed incontinence: Secondary | ICD-10-CM

## 2022-10-24 DIAGNOSIS — E871 Hypo-osmolality and hyponatremia: Secondary | ICD-10-CM | POA: Diagnosis not present

## 2022-10-24 DIAGNOSIS — I10 Essential (primary) hypertension: Secondary | ICD-10-CM | POA: Diagnosis not present

## 2022-10-24 DIAGNOSIS — G301 Alzheimer's disease with late onset: Secondary | ICD-10-CM | POA: Diagnosis not present

## 2022-10-24 DIAGNOSIS — E890 Postprocedural hypothyroidism: Secondary | ICD-10-CM | POA: Diagnosis not present

## 2022-10-24 DIAGNOSIS — M25551 Pain in right hip: Secondary | ICD-10-CM

## 2022-10-24 DIAGNOSIS — M810 Age-related osteoporosis without current pathological fracture: Secondary | ICD-10-CM

## 2022-10-24 DIAGNOSIS — K5901 Slow transit constipation: Secondary | ICD-10-CM

## 2022-10-24 DIAGNOSIS — R41841 Cognitive communication deficit: Secondary | ICD-10-CM | POA: Diagnosis not present

## 2022-10-24 NOTE — Progress Notes (Signed)
Provider:   Location:  [facility] Room Number: 25/A Place of Service:  SNF (31)  PCP: Frances Dad, MD Patient Care Team: Frances Dad, MD as PCP - General (Internal Medicine) Frances Bowen, MD as Consulting Physician (Endocrinology)  Extended Emergency Contact Information Primary Emergency Contact: Frances Holland Address: [address]          [place], [place] [address] [name] of [place] Phone: [phone] Mobile Phone: [phone] Relation: Spouse Secondary Emergency Contact: Frances Holland Address: [address]          [place], [place] [address] [name] of [place] Phone: [phone] Work Phone: [phone] Mobile Phone: [phone] Relation: Son  Code Status: DNR Goals of Care: Advanced Directive information    10/24/2022   12:13 PM  Advanced Directives  Does Patient Have a Medical Advance Directive? Yes  Type of Advance Directive Living will;Out of facility DNR (pink MOST or yellow form)  Does patient want to make changes to medical advance directive? No - Patient declined  Copy of [place] in Chart? Yes - validated most recent copy scanned in chart (See row information)      Chief Complaint  Patient presents with   New Admit To SNF    Patient is new admission to SNF    HPI: Patient is a 88 y.o. female seen today for admission to SNF  Patient was living in [place] with her husband but is not transferred to SNF due to unable to perform her ADLs due to right hip pain  Patient has h/o Hypertension, Hypothyroidism, Insomnia and Cognitive impairment. 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  Patient is walking with a walker but dragging her right hip.  They are now using wheelchair for long distance She did have appointment with orthopedics but the husband cancelled  because he feels it is  going to be hard for her to go and see them. He wants her to stay in skilled and get therapy Urinary incontinence mixed Continues to be her problem has failed pessary.  Cognitive impairment has not tolerated Namenda and Arice Hyponatremia Her HCTZ was DC'd and repeat BMP shows stable sodium Hemorrhoids with constipation  Patient did not have any other complaints today She is little upset because her children were leaning and she is not able to sleep with her husband as he continues to stay in [place]   Past Medical History:  Diagnosis Date   Back pain    Breast cancer ([place])    Cancer ([place])    left breaset mastectomy    Candidiasis of skin and nails    Cellulitis and abscess of upper arm and forearm    Heartburn    Hemorrhoids 08/20/2016   History of breast cancer 2000   History left mastectomy   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   Hypertension    Insomnia    Knee pain, bilateral 2014   Lymphedema    Left arm following mastectomy   Memory impairment    Memory loss    Migraine 05/16/2012   Migraine without aura, without mention of intractable migraine without mention of status migrainosus    patient denies at preop of 08/08/16    Other and unspecified hyperlipidemia    Other bursitis disorders    Other infective bursitis, left shoulder    Pain in joint, ankle and foot  Pain in joint, hand    Pain in joint, lower leg    Pain in joint, shoulder region    Pain in joint, shoulder region 04/23/2016   Pain in limb    Rectal bleeding 08/20/2016   Senile osteoporosis    Symptomatic menopausal or female climacteric states    Syncope and collapse    Unspecified hypertensive heart disease without heart failure 1995   Unspecified hypothyroidism    Unspecified polyarthropathy or polyarthritis, site unspecified    Unspecified urinary incontinence    Unspecified visual loss    Xerophthalmia    Past Surgical History:  Procedure Laterality Date   ABDOMINAL HYSTERECTOMY      BILATERAL OOPHORECTOMY  1981   BREAST RECONSTRUCTION Left 01/2000   BREAST REDUCTION SURGERY Right 01/2000   CATARACT EXTRACTION Right 09/04/2005   COLONOSCOPY  01/2004   MASTECTOMY Left 10/1999   THYROID LOBECTOMY Left 08/14/2016   Procedure: NEAR TOTAL THYROIDECTOMY;  Surgeon: Frances Hausen, MD;  Location: [facility];  Service: General;  Laterality: Left;   TOTAL ABDOMINAL HYSTERECTOMY W/ BILATERAL SALPINGOOPHORECTOMY  1981   TOTAL KNEE ARTHROPLASTY Right 2014    reports that she has never smoked. She has never used smokeless tobacco. She reports that she does not drink alcohol and does not use drugs. Social History   Socioeconomic History   Marital status: Married    Spouse name: Not on file   Number of children: 3   Years of education: 14   Highest education level: Not on file  Occupational History   Occupation: retired Estate agent   Tobacco Use   Smoking status: Never   Smokeless tobacco: Never  Vaping Use   Vaping Use: Never used  Substance and Sexual Activity   Alcohol use: No   Drug use: No   Sexual activity: Never    Comment: 1st intercourse 88 yo-Fewer than 5 partners  Other Topics Concern   Not on file  Social History Narrative   Lives at [facility] since 07/07/2014   Married -Frances Holland   Never smoked   Right handed     Alcohol none   Caffeine 2 cups of coffee daily   Exercise walks daily, exercise class half hour 2-3 times a week   Living will,  POA   Patient believes that her memory is OK except for recall of names. Her husband is concerned about it. She scored 29/30 on MMSE in Dec 2017. They both have concerns about the future if he precedes her in death, but he has written down how he keeps accounts and has scheduled an appt with an accountant to do their taxes. They are a 2nd marriage and have his and her families although the "children" get along. They have been married 30 years.   Social Determinants of Health   Financial Resource Strain: Low Risk   (02/18/2022)   Overall Financial Resource Strain (CARDIA)    Difficulty of Paying Living Expenses: Not hard at all  Food Insecurity: No Food Insecurity (02/18/2022)   Hunger Vital Sign    Worried About Running Out of Food in the Last Year: Never true    Ran Out of Food in the Last Year: Never true  Transportation Needs: No Transportation Needs (02/18/2022)   PRAPARE - Hydrologist (Medical): No    Lack of Transportation (Non-Medical): No  Physical Activity: Insufficiently Active (02/18/2022)   Exercise Vital Sign    Days of Exercise per Week: 7 days  Minutes of Exercise per Session: 10 min  Stress: No Stress Concern Present (02/18/2022)   [place]    Feeling of Stress : Not at all  Social Connections: Moderately Integrated (02/18/2022)   Social Connection and Isolation Panel [NHANES]    Frequency of Communication with Friends and Family: More than three times a week    Frequency of Social Gatherings with Friends and Family: More than three times a week    Attends Religious Services: More than 4 times per year    Active Member of [employer] or Organizations: No    Attends Archivist Meetings: Never    Marital Status: Married  Human resources officer Violence: Not At Risk (02/18/2022)   Humiliation, Afraid, Rape, and Kick questionnaire    Fear of Current or Ex-Partner: No    Emotionally Abused: No    Physically Abused: No    Sexually Abused: No    Functional Status Survey:    Family History  Problem Relation Age of Onset   Heart disease Mother    Dementia Mother    Dementia Father    Cancer Daughter    Asthma Son     Health Maintenance  Topic Date Due   COVID-19 Vaccine (7 - Moderna risk series) 12/10/2022   Medicare Annual Wellness (AWV)  02/19/2023   Pneumonia Vaccine 65+ Years old  Completed   INFLUENZA VACCINE  Completed   DEXA SCAN  Completed   Zoster Vaccines- Shingrix   Completed   HPV VACCINES  Aged Out    Allergies  Allergen Reactions   Donepezil Hcl     Body pain and stiffness    Namenda [Memantine Hcl]      Caused body pain    Sulfa Antibiotics    Sulfamethoxazole Rash    Outpatient Encounter Medications as of 10/24/2022  Medication Sig   acetaminophen (TYLENOL) 325 MG tablet Take 325 mg by mouth 2 (two) times daily. Give 2 tablets (total of 650 mg) for pain.   amLODipine (NORVASC) 2.5 MG tablet Take 7.5 mg by mouth daily.   Cholecalciferol 5000 units capsule Take 5,000 Units by mouth daily.   levothyroxine (SYNTHROID) 112 MCG tablet Take 112 mcg by mouth daily before breakfast.   lidocaine (HM LIDOCAINE PATCH) 4 % Place 2 patches onto the skin daily. Apply to lower back topically one time a day.   losartan (COZAAR) 100 MG tablet Take 100 mg by mouth daily.   Menthol, Topical Analgesic, (BIOFREEZE) 4 % GEL Apply 1 Application topically 3 (three) times daily. Apply to lower legs topically as needed for pain.   Multiple Vitamins-Minerals (CENTRUM SILVER PO) Take 1 tablet by mouth daily.    Omega-3 Fatty Acids (OMEGA-3 CF PO) Take 1,000 mg by mouth every morning. With food   Ostomy Supplies (SKIN PREP WIPES) MISC 2 Applications by Does not apply route 2 (two) times daily. Apply to heels topically for skin care treatment.   oxybutynin (DITROPAN XL) 15 MG 24 hr tablet Take 15 mg by mouth daily.   Polyethyl Glycol-Propyl Glycol 0.4-0.3 % SOLN Place 2 drops into both eyes daily as needed (dry eyes).    senna-docusate (SENNA S) 8.6-50 MG tablet Take 1 tablet by mouth daily.   senna-docusate (SENOKOT-S) 8.6-50 MG tablet Take 1 tablet by mouth daily. As needed for constipation (PRN)   triamcinolone (KENALOG) 0.025 % cream Apply 1 application  topically as needed (Apply to affected area topically).   zinc oxide  20 % ointment Apply 1 application  topically as needed for irritation. Apply to buttocks topically after every incontinent episode for redness    [DISCONTINUED] amlodipine-atorvastatin (CADUET) 2.5-10 MG tablet Take 3 tablets by mouth every morning. (Total of 7.5 mg)   No facility-administered encounter medications on file as of 10/24/2022.    Review of Systems  Constitutional:  Negative for activity change and appetite change.  HENT: Negative.    Respiratory:  Negative for cough and shortness of breath.   Cardiovascular:  Negative for leg swelling.  Gastrointestinal:  Negative for constipation.  Genitourinary: Negative.   Musculoskeletal:  Positive for arthralgias, gait problem and myalgias.  Skin: Negative.   Neurological:  Positive for weakness. Negative for dizziness.  Psychiatric/Behavioral:  Positive for confusion and dysphoric mood. Negative for sleep disturbance.     Vitals:   10/24/22 1209  BP: (!) 156/74  Pulse: 87  Resp: 20  Temp: (!) 97.2 F (36.2 C)  TempSrc: Temporal  SpO2: 92%  Weight: 162 lb 4.8 oz (73.6 kg)  Height: '5\' 4"'$  (1.626 m)   Body mass index is 27.86 kg/m. Physical Exam Vitals reviewed.  Constitutional:      Appearance: Normal appearance.  HENT:     Head: Normocephalic.     Nose: Nose normal.     Mouth/Throat:     Mouth: Mucous membranes are moist.     Pharynx: Oropharynx is clear.  Eyes:     Pupils: Pupils are equal, round, and reactive to light.  Cardiovascular:     Rate and Rhythm: Normal rate and regular rhythm.     Pulses: Normal pulses.     Heart sounds: Normal heart sounds. No murmur heard. Pulmonary:     Effort: Pulmonary effort is normal.     Breath sounds: Normal breath sounds.  Abdominal:     General: Abdomen is flat. Bowel sounds are normal.     Palpations: Abdomen is soft.  Musculoskeletal:        General: No swelling.     Cervical back: Neck supple.  Skin:    General: Skin is warm.  Neurological:     General: No focal deficit present.     Mental Status: She is alert.  Psychiatric:        Mood and Affect: Mood normal.        Thought Content: Thought content  normal.     Labs reviewed: Basic Metabolic Panel: Recent Labs    07/29/22 0000 09/19/22 0000 09/23/22 0000  NA 132* 129* 131*  K 4.1 4.3 4.2  CL 97* 94* 97*  CO2 24* 28* 28*  BUN '13 15 13  '$ CREATININE 0.6 0.6 0.7  CALCIUM 9.9 9.6 9.3   Liver Function Tests: Recent Labs    06/03/22 0000 07/26/22 0000  AST 13 15  ALT 9 12  ALKPHOS 67 62  ALBUMIN 3.8 3.9   No results for input(s): "LIPASE", "AMYLASE" in the last 8760 hours. No results for input(s): "AMMONIA" in the last 8760 hours. CBC: Recent Labs    06/03/22 0000 07/26/22 0000  WBC 5.8 6.0  NEUTROABS 3,057.00 3,372.00  HGB 12.8 12.8  HCT 38 37  PLT 376 364   Cardiac Enzymes: No results for input(s): "CKTOTAL", "CKMB", "CKMBINDEX", "TROPONINI" in the last 8760 hours. BNP: Invalid input(s): "POCBNP" No results found for: "HGBA1C" Lab Results  Component Value Date   TSH 4.50 07/26/2022   Lab Results  Component Value Date   VITAMINB12 601 09/09/2018   No results  found for: "FOLATE" No results found for: "IRON", "TIBC", "FERRITIN"  Imaging and Procedures obtained prior to SNF admission: MM 3D SCREEN BREAST UNI RIGHT  Result Date: 09/26/2021 CLINICAL DATA:  Screening. EXAM: DIGITAL SCREENING UNILATERAL RIGHT MAMMOGRAM WITH CAD AND TOMOSYNTHESIS TECHNIQUE: Right screening digital craniocaudal and mediolateral oblique mammograms were obtained. Right screening digital breast tomosynthesis was performed. The images were evaluated with computer-aided detection. COMPARISON:  Previous exam(s). ACR Breast Density Category b: There are scattered areas of fibroglandular density. FINDINGS: The patient has had a left mastectomy. There are no findings suspicious for malignancy. IMPRESSION: No mammographic evidence of malignancy. A result letter of this screening mammogram will be mailed directly to the patient. RECOMMENDATION: Screening mammogram in one year.  (Code:SM-R-73M) BI-RADS CATEGORY  1: Negative. Electronically  Signed   By: Nolon Nations M.D.   On: 09/26/2021 16:29   DG Bone Density  Result Date: 09/24/2021 EXAM: DUAL X-RAY ABSORPTIOMETRY (DXA) FOR BONE MINERAL DENSITY IMPRESSION: Referring Physician:  Virgie Holland Your patient completed a bone mineral density test using GE Lunar iDXA system (analysis version: 16). Technologist: [place] PATIENT: Name: Frances Holland, Frances Holland Patient ID: [id number] Birth Date: [birth year] Height: 62.5 in. Sex: Female Measured: 09/21/2021 Weight: 163.0 lbs. Indications: Advanced Age, Bilateral Ovariectomy (65.51), Breast Cancer History, Caucasian, Estrogen Deficient, Hysterectomy, Postmenopausal, Secondary Osteoporosis Fractures: NONE Treatments: None ASSESSMENT: The BMD measured at Femur Neck Left is 0.785 g/cm2 with a T-score of -1.8. This patient is considered osteopenic/low bone mass according to [facility]) criteria. The quality of the exam is limited by the patient's mobility and the patient is unable to achieve the LT forearm positioning. L2 and L3 were excluded due to degenerative changes. The patient does not meet FRAX criteria. Site Region Measured Date Measured Age YA BMD Significant CHANGE T-score DualFemur Neck Left  09[age]21/2021    90.3         -1.8    0.785 g/cm2 AP Spine L1-L4 (L2, L3) 09/21/2021 90.3 -0.8 1.065 g/cm2 DualFemur Total Mean 09/21/2021    90.3         -1.4    0.828 g/cm2 World Health Organization [facility]) criteria for post-menopausal, Caucasian Women: Normal       T-score at or above -1 SD Osteopenia   T-score between -1 and -2.5 SD Osteoporosis T-score at or below -2.5 SD RECOMMENDATION: 1. All patients should optimize calcium and vitamin D intake. 2. Consider FDA-approved medical therapies in postmenopausal women and men aged 65 years and older, based on the following: a. A hip or vertebral (clinical or morphometric) fracture. b. T-score = -2.5 at the femoral neck or spine after appropriate evaluation to exclude secondary causes. c. Low bone mass  (T-score between -1.0 and -2.5 at the femoral neck or spine) and a 10-year probability of a hip fracture = 3% or a 10-year probability of a major osteoporosis-related fracture = 20% based on the [place]-adapted WHO algorithm. d. Clinician judgment and/or patient preferences may indicate treatment for people with 10-year fracture probabilities above or below these levels. FOLLOW-UP: Patients with diagnosis of osteoporosis or at high risk for fracture should have regular bone mineral density tests.? Patients eligible for Medicare are allowed routine testing every 2 years.? The testing frequency can be increased to one year for patients who have rapidly progressing disease, are receiving or discontinuing medical therapy to restore bone mass, or have additional risk factors. I have reviewed this study and agree with the findings. Mark A. Thornton Papas, M.D. [facility]  Electronically Signed   By: Lavonia Dana M.D.   On: 09/24/2021 18:09    Assessment/Plan 1. Unstable gait Now in SNF Will work with therapy  2. Right hip pain Tylenol PRN Wait for Ortho Xray showed Severe arthritis  3. Hyponatremia Off HCTZ  4. Primary hypertension BP stable On Norvasc and Losartan  5. Mixed stress and urge urinary incontinence On Ditropan   6. Late onset Alzheimer's 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 ([place]) Failed Aricept and Namenda due to Side effects developed rash due to Exelon Patch Plan to stay in SNF  7. Mixed hyperlipidemia Did not tolerate Statins  8. Postoperative hypothyroidism TSH normal in 01/23  9. Osteopenia DEXA done in 10/22    10. Slow transit constipation [place]  11. Hemorrhoids, unspecified hemorrhoid type Anusol PRN    Family/ staff Communication:   Labs/tests ordered:

## 2022-10-25 DIAGNOSIS — Z9181 History of falling: Secondary | ICD-10-CM | POA: Diagnosis not present

## 2022-10-25 DIAGNOSIS — M6281 Muscle weakness (generalized): Secondary | ICD-10-CM | POA: Diagnosis not present

## 2022-10-26 DIAGNOSIS — R2681 Unsteadiness on feet: Secondary | ICD-10-CM | POA: Diagnosis not present

## 2022-10-26 DIAGNOSIS — M6281 Muscle weakness (generalized): Secondary | ICD-10-CM | POA: Diagnosis not present

## 2022-10-27 DIAGNOSIS — R2681 Unsteadiness on feet: Secondary | ICD-10-CM | POA: Diagnosis not present

## 2022-10-27 DIAGNOSIS — M6281 Muscle weakness (generalized): Secondary | ICD-10-CM | POA: Diagnosis not present

## 2022-10-29 ENCOUNTER — Ambulatory Visit: Payer: PPO

## 2022-10-29 DIAGNOSIS — R2681 Unsteadiness on feet: Secondary | ICD-10-CM | POA: Diagnosis not present

## 2022-10-29 DIAGNOSIS — Z9181 History of falling: Secondary | ICD-10-CM | POA: Diagnosis not present

## 2022-10-29 DIAGNOSIS — M6281 Muscle weakness (generalized): Secondary | ICD-10-CM | POA: Diagnosis not present

## 2022-11-01 DIAGNOSIS — M6281 Muscle weakness (generalized): Secondary | ICD-10-CM | POA: Diagnosis not present

## 2022-11-01 DIAGNOSIS — R2681 Unsteadiness on feet: Secondary | ICD-10-CM | POA: Diagnosis not present

## 2022-11-04 DIAGNOSIS — Z9181 History of falling: Secondary | ICD-10-CM | POA: Diagnosis not present

## 2022-11-04 DIAGNOSIS — M6281 Muscle weakness (generalized): Secondary | ICD-10-CM | POA: Diagnosis not present

## 2022-11-04 DIAGNOSIS — R2681 Unsteadiness on feet: Secondary | ICD-10-CM | POA: Diagnosis not present

## 2022-11-05 DIAGNOSIS — M6281 Muscle weakness (generalized): Secondary | ICD-10-CM | POA: Diagnosis not present

## 2022-11-05 DIAGNOSIS — Z9181 History of falling: Secondary | ICD-10-CM | POA: Diagnosis not present

## 2022-11-05 DIAGNOSIS — R2681 Unsteadiness on feet: Secondary | ICD-10-CM | POA: Diagnosis not present

## 2022-11-08 DIAGNOSIS — M6281 Muscle weakness (generalized): Secondary | ICD-10-CM | POA: Diagnosis not present

## 2022-11-08 DIAGNOSIS — Z9181 History of falling: Secondary | ICD-10-CM | POA: Diagnosis not present

## 2022-11-08 DIAGNOSIS — R2681 Unsteadiness on feet: Secondary | ICD-10-CM | POA: Diagnosis not present

## 2022-11-11 DIAGNOSIS — R2681 Unsteadiness on feet: Secondary | ICD-10-CM | POA: Diagnosis not present

## 2022-11-11 DIAGNOSIS — M6281 Muscle weakness (generalized): Secondary | ICD-10-CM | POA: Diagnosis not present

## 2022-11-12 DIAGNOSIS — R2681 Unsteadiness on feet: Secondary | ICD-10-CM | POA: Diagnosis not present

## 2022-11-12 DIAGNOSIS — M6281 Muscle weakness (generalized): Secondary | ICD-10-CM | POA: Diagnosis not present

## 2022-11-15 DIAGNOSIS — M6281 Muscle weakness (generalized): Secondary | ICD-10-CM | POA: Diagnosis not present

## 2022-11-15 DIAGNOSIS — R2681 Unsteadiness on feet: Secondary | ICD-10-CM | POA: Diagnosis not present

## 2022-11-18 ENCOUNTER — Non-Acute Institutional Stay (SKILLED_NURSING_FACILITY): Payer: PPO | Admitting: Orthopedic Surgery

## 2022-11-18 ENCOUNTER — Encounter: Payer: Self-pay | Admitting: Orthopedic Surgery

## 2022-11-18 DIAGNOSIS — E871 Hypo-osmolality and hyponatremia: Secondary | ICD-10-CM

## 2022-11-18 DIAGNOSIS — E782 Mixed hyperlipidemia: Secondary | ICD-10-CM

## 2022-11-18 DIAGNOSIS — I10 Essential (primary) hypertension: Secondary | ICD-10-CM | POA: Diagnosis not present

## 2022-11-18 DIAGNOSIS — F0280 Dementia in other diseases classified elsewhere without behavioral disturbance: Secondary | ICD-10-CM | POA: Diagnosis not present

## 2022-11-18 DIAGNOSIS — R2681 Unsteadiness on feet: Secondary | ICD-10-CM | POA: Diagnosis not present

## 2022-11-18 DIAGNOSIS — M810 Age-related osteoporosis without current pathological fracture: Secondary | ICD-10-CM | POA: Diagnosis not present

## 2022-11-18 DIAGNOSIS — N3946 Mixed incontinence: Secondary | ICD-10-CM

## 2022-11-18 DIAGNOSIS — G301 Alzheimer's disease with late onset: Secondary | ICD-10-CM

## 2022-11-18 DIAGNOSIS — M25551 Pain in right hip: Secondary | ICD-10-CM | POA: Diagnosis not present

## 2022-11-18 DIAGNOSIS — E890 Postprocedural hypothyroidism: Secondary | ICD-10-CM | POA: Diagnosis not present

## 2022-11-18 DIAGNOSIS — M6281 Muscle weakness (generalized): Secondary | ICD-10-CM | POA: Diagnosis not present

## 2022-11-18 NOTE — Progress Notes (Signed)
Location:  [facility] Room Number: 25/A Place of Service:  SNF [phone]) Provider:  Yvonna Alanis, NP   Virgie Dad, MD  Patient Care Team: Virgie Dad, MD as PCP - General (Internal Medicine) Reynold Bowen, MD as Consulting Physician (Endocrinology)  Extended Emergency Contact Information Primary Emergency Contact: Borgwardt,Gordon Address: [address]          [place], [place] [address] [name] of [place] Phone: [phone] Mobile Phone: [phone] Relation: Spouse Secondary Emergency Contact: [name],[name] Address: [address]          [place], [place] [address] [name] of [place] Phone: [phone] Work Phone: [phone] Mobile Phone: [phone] Relation: Son  Code Status:  DNR Goals of care: Advanced Directive information    11/18/2022    2:37 PM  Advanced Directives  Does Patient Have a Medical Advance Directive? Yes  Type of Advance Directive Living will;Out of facility DNR (pink MOST or yellow form)  Does patient want to make changes to medical advance directive? No - Patient declined  Copy of [place] in Chart? Yes - validated most recent copy scanned in chart (See row information)  Pre-existing out of facility DNR order (yellow form or pink MOST form) Pink MOST form placed in chart (order not valid for inpatient use);Yellow form placed in chart (order not valid for inpatient use)     Chief Complaint  Patient presents with   Medical Management of Chronic Issues    Routine Visit with the provider on site at [facility] verified.    HPI:  Pt is a 88 y.o. female seen today for medical management of chronic diseases.    She currently resides on the skilled nursing unit at [facility]. [place]: HTN, migraines, rectal bleeding, hypothyroidism, s/p thyroidectomy (left) 2017, dementia, osteoporosis, constipation, h/o breast and skin cancer,  abnormal gait and  insomnia.    Dementia- MMSE 23/30 02/2022, no behavioral outbursts, adjusted well to SNF, talking less, ambulates with walker/wheelchair, unable to tolerate Namenda/Aricept in past/ rash with Exelon patch, not on medication Unstable gait- no recent falls, now using wheelchair for long distances Right hip pain-  recent xrays confirmed severe arthritis, mobility and pain have improved with PT (last week), remains on tylenol and lidocaine patches HTN- BUN/creat 13/0.7 09/23/2022, remains on amlodipine and losartan HLD- LDL 109 2021, not on statin Hypothyroidism- s/p left lobe thyroidectomy 2017, TSH 4.50 07/26/2022, remains on levothyroxine Senile osteoporosis- DEXA 09/2021, remains on vitamin D Urinary incontinence- stable with oxybutynin Hyponatremia- Na + 131 09/23/2022  Recent blood pressures:  12/05- 132/70  11/28- 164/93  11/23- 149/74  Recent weights:  12/02- 166.6 lbs  11/01- 167.8 lbs  10/04- 165 lbs  Past Surgical History:  Procedure Laterality Date   ABDOMINAL HYSTERECTOMY     BILATERAL OOPHORECTOMY  1981   BREAST RECONSTRUCTION Left 01/2000   BREAST REDUCTION SURGERY Right 01/2000   CATARACT EXTRACTION Right 09/04/2005   COLONOSCOPY  01/2004   MASTECTOMY Left 10/1999   THYROID LOBECTOMY Left 08/14/2016   Procedure: NEAR TOTAL THYROIDECTOMY;  Surgeon: Johnathan Hausen, MD;  Location: [facility];  Service: General;  Laterality: Left;   TOTAL ABDOMINAL HYSTERECTOMY W/ BILATERAL SALPINGOOPHORECTOMY  1981   TOTAL KNEE ARTHROPLASTY Right 2014    Allergies  Allergen Reactions   Donepezil Hcl     Body pain and stiffness    Namenda [Memantine Hcl]      Caused body pain  Sulfa Antibiotics    Sulfamethoxazole Rash    Outpatient Encounter Medications as of 11/18/2022  Medication Sig   acetaminophen (TYLENOL) 325 MG tablet Take 325 mg by mouth 2 (two) times daily. Give 2 tablets (total of 650 mg) for pain.   amLODipine (NORVASC) 2.5 MG tablet Take 7.5 mg by mouth daily.    Cholecalciferol 5000 units capsule Take 5,000 Units by mouth daily.   levothyroxine (SYNTHROID) 112 MCG tablet Take 112 mcg by mouth daily before breakfast.   lidocaine (HM LIDOCAINE PATCH) 4 % Place 2 patches onto the skin daily. Apply to lower back topically one time a day.   losartan (COZAAR) 100 MG tablet Take 100 mg by mouth daily.   Menthol, Topical Analgesic, (BIOFREEZE) 4 % GEL Apply 1 Application topically 3 (three) times daily. Apply to lower legs topically as needed for pain.   Multiple Vitamins-Minerals (CENTRUM SILVER PO) Take 1 tablet by mouth daily.    Omega-3 Fatty Acids (OMEGA-3 CF PO) Take 1,000 mg by mouth every morning. With food   Ostomy Supplies (SKIN PREP WIPES) MISC 2 Applications by Does not apply route 2 (two) times daily. Apply to heels topically for skin care treatment.   oxybutynin (DITROPAN XL) 15 MG 24 hr tablet Take 15 mg by mouth daily.   Polyethyl Glycol-Propyl Glycol 0.4-0.3 % SOLN Place 2 drops into both eyes daily as needed (dry eyes).    senna-docusate (SENNA S) 8.6-50 MG tablet Take 1 tablet by mouth daily.   triamcinolone (KENALOG) 0.025 % cream Apply 1 application  topically as needed (Apply to affected area topically).   zinc oxide 20 % ointment Apply 1 application  topically as needed for irritation. Apply to buttocks topically after every incontinent episode for redness   [DISCONTINUED] senna-docusate (SENOKOT-S) 8.6-50 MG tablet Take 1 tablet by mouth daily. As needed for constipation (PRN)   No facility-administered encounter medications on file as of 11/18/2022.    Review of Systems  Unable to perform ROS: Dementia    Immunization History  Administered Date(s) Administered   Fluad Quad(high Dose 65+) 10/02/2022   Influenza, High Dose Seasonal PF 09/17/2017, 09/10/2018, 09/22/2019, 09/13/2021   Influenza, Quadrivalent, Recombinant, Inj, Pf 08/29/2020   Influenza,inj,Quad PF,6+ Mos 09/10/2018   Influenza-Unspecified 08/10/2013, 09/07/2015,  09/13/2016   Moderna Covid-19 Vaccine Bivalent Booster 6yr & up 05/22/2022   Moderna SARS-COV2 Booster Vaccination 05/16/2021   Moderna Sars-Covid-2 Vaccination 12/13/2019, 01/10/2020, 10/23/2020   PFIZER(Purple Top)SARS-COV-2 Vaccination 08/29/2021   Pfizer Covid-19 Vaccine Bivalent Booster 135yr& up 10/15/2022   Pneumococcal Conjugate-13 06/08/2017, 11/02/2018   Pneumococcal Polysaccharide-23 12/09/2002, 06/08/2017, 10/22/2017, 11/02/2018   Pneumococcal-Unspecified 06/08/2017, 11/02/2018   Tdap 02/21/2014, 10/29/2017   Zoster Recombinat (Shingrix) 07/18/2017, 01/10/2018   Zoster, Live 07/18/2017, 01/10/2018   Pertinent  Health Maintenance Due  Topic Date Due   INFLUENZA VACCINE  Completed   DEXA SCAN  Completed      09/06/2020    8:39 AM 03/07/2021    1:03 PM 09/05/2021    8:54 AM 02/18/2022    2:20 PM 11/18/2022    2:34 PM  Fall Risk  Falls in the past year? 0 0 0  0  Was there an injury with Fall?    0 0  Fall Risk Category Calculator     0  Fall Risk Category     Low  Patient Fall Risk Level   Low fall risk Moderate fall risk Moderate fall risk  Patient at Risk for Falls Due to  History of fall(s);Impaired balance/gait;Impaired mobility History of fall(s)  Fall risk Follow up   Falls evaluation completed Falls evaluation completed;Education provided;Falls prevention discussed Falls evaluation completed   Functional Status Survey:    Vitals:   11/18/22 1426  BP: 132/70  Pulse: 85  Resp: (!) 21  Temp: (!) 97.4 F (36.3 C)  SpO2: 95%  Weight: 166 lb 6 oz (75.5 kg)  Height: '5\' 4"'$  (1.626 m)   Body mass index is 28.56 kg/m. Physical Exam Vitals reviewed.  Constitutional:      General: She is not in acute distress. HENT:     Head: Normocephalic.  Eyes:     General:        Right eye: No discharge.        Left eye: No discharge.  Neck:     Vascular: No carotid bruit.  Cardiovascular:     Rate and Rhythm: Normal rate and regular rhythm.     Pulses: Normal  pulses.     Heart sounds: Normal heart sounds.  Pulmonary:     Effort: Pulmonary effort is normal. No respiratory distress.     Breath sounds: Normal breath sounds. No wheezing or rales.  Abdominal:     General: Bowel sounds are normal. There is no distension.     Palpations: Abdomen is soft.     Tenderness: There is no abdominal tenderness.  Musculoskeletal:     Cervical back: Neck supple.     Right lower leg: No edema.     Left lower leg: No edema.  Lymphadenopathy:     Cervical: No cervical adenopathy.  Skin:    General: Skin is warm and dry.     Capillary Refill: Capillary refill takes less than 2 seconds.  Neurological:     General: No focal deficit present.     Mental Status: She is alert. Mental status is at baseline.     Motor: Weakness present.     Gait: Gait abnormal.  Psychiatric:        Mood and Affect: Mood normal.        Behavior: Behavior normal.     Comments: Follows commands, gives simple answers, cannot finish sentence at times, alert to self/familiar face     Labs reviewed: Recent Labs    07/29/22 0000 09/19/22 0000 09/23/22 0000  NA 132* 129* 131*  K 4.1 4.3 4.2  CL 97* 94* 97*  CO2 24* 28* 28*  BUN '13 15 13  '$ CREATININE 0.6 0.6 0.7  CALCIUM 9.9 9.6 9.3   Recent Labs    06/03/22 0000 07/26/22 0000  AST 13 15  ALT 9 12  ALKPHOS 67 62  ALBUMIN 3.8 3.9   Recent Labs    06/03/22 0000 07/26/22 0000  WBC 5.8 6.0  NEUTROABS 3,057.00 3,372.00  HGB 12.8 12.8  HCT 38 37  PLT 376 364   Lab Results  Component Value Date   TSH 4.50 07/26/2022   No results found for: "HGBA1C" Lab Results  Component Value Date   CHOL 190 07/26/2020   HDL 56 07/26/2020   LDLCALC 109 (H) 07/26/2020   TRIG 139 07/26/2020   CHOLHDL 3.4 07/26/2020    Significant Diagnostic Results in last 30 days:  No results found.  Assessment/Plan 1. Late onset Alzheimer's 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 ([place]) - now in SNF- adjusting well - failed trial of  Namenda/Aricept/Exelon - talking less, giving short answers - ambulating with walker/ w/c for long distances - not on medication  2.  Unstable gait - no recent falls - cont PT/OT  3. Right hip pain - improved with PT - cont tylenol and lidocaine patches  4. Primary hypertension - controlled - cont amlodipine and losartan  5. Mixed hyperlipidemia - off statin  6. Postoperative hypothyroidism - TSH stable - cont levothyroxine  7. Senile osteoporosis - DEXA 2022 - cont vitamin D  8. Mixed stress and urge urinary incontinence - cont oxybutynin  9. Hyponatremia - Na+ stable    Family/ staff Communication: plan discussed with patient, husband and nurse  Labs/tests ordered:  none

## 2022-11-18 NOTE — Progress Notes (Deleted)
Location:  [facility] Room Number: 25/A Place of Service:  SNF [phone]) Provider:  Windell Moulding, NP   Patient Care Team: Virgie Dad, MD as PCP - General (Internal Medicine) Reynold Bowen, MD as Consulting Physician (Endocrinology)  Extended Emergency Contact Information Primary Emergency Contact: Pettijohn,Gordon Address: [address] [place]          [place], [place] [address] [name] of [place] Phone: [phone] Mobile Phone: [phone] Relation: Spouse Secondary Emergency Contact: [name],[name] Address: [address]          [place], [place] [address] [name] of [place] Phone: [phone] Work Phone: [phone] Mobile Phone: [phone] Relation: Son  Code Status:  DNR Goals of care: Advanced Directive information    11/18/2022    2:37 PM  Advanced Directives  Does Patient Have a Medical Advance Directive? Yes  Type of Advance Directive Living will;Out of facility DNR (pink MOST or yellow form)  Does patient want to make changes to medical advance directive? No - Patient declined  Copy of [place] in Chart? Yes - validated most recent copy scanned in chart (See row information)  Pre-existing out of facility DNR order (yellow form or pink MOST form) Pink MOST form placed in chart (order not valid for inpatient use);Yellow form placed in chart (order not valid for inpatient use)     Chief Complaint  Patient presents with   Medical Management of Chronic Issues    Routine Visit with the provider on site at [facility] verified.    HPI:  Pt is a 88 y.o. female seen today for medical management of chronic diseases.     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   Hypertension    Insomnia    Knee pain, bilateral 2014   Lymphedema    Left arm following mastectomy   Memory impairment    Memory loss    Migraine 05/16/2012   Migraine without aura, without mention of intractable migraine without mention of status migrainosus    patient denies at preop of 08/08/16    Other and unspecified hyperlipidemia    Other bursitis disorders    Other infective bursitis, left shoulder    Pain in joint, ankle and foot    Pain in joint, hand    Pain in joint, lower leg    Pain in joint, shoulder region    Pain in joint, shoulder region 04/23/2016   Pain in limb    Rectal bleeding 08/20/2016   Senile osteoporosis    Symptomatic menopausal or female climacteric states    Syncope and collapse    Unspecified hypertensive heart disease without heart failure 1995   Unspecified hypothyroidism    Unspecified polyarthropathy or polyarthritis, site unspecified    Unspecified urinary incontinence    Unspecified visual loss    Xerophthalmia    Past Surgical History:  Procedure Laterality Date   ABDOMINAL HYSTERECTOMY     BILATERAL OOPHORECTOMY  1981   BREAST RECONSTRUCTION Left 01/2000   BREAST REDUCTION SURGERY Right 01/2000   CATARACT EXTRACTION  Right 09/04/2005   COLONOSCOPY  01/2004   MASTECTOMY Left 10/1999   THYROID LOBECTOMY Left 08/14/2016   Procedure: NEAR TOTAL THYROIDECTOMY;  Surgeon: Johnathan Hausen, MD;  Location: [facility];  Service: General;  Laterality: Left;   TOTAL ABDOMINAL HYSTERECTOMY W/ BILATERAL SALPINGOOPHORECTOMY  1981   TOTAL KNEE ARTHROPLASTY Right 2014    Allergies  Allergen Reactions   Donepezil Hcl     Body pain and stiffness    Namenda [Memantine Hcl]      Caused body pain    Sulfa Antibiotics    Sulfamethoxazole Rash    Outpatient Encounter Medications as of 11/18/2022  Medication Sig   acetaminophen (TYLENOL) 325 MG tablet Take 325 mg by  mouth 2 (two) times daily. Give 2 tablets (total of 650 mg) for pain.   amLODipine (NORVASC) 2.5 MG tablet Take 7.5 mg by mouth daily.   Cholecalciferol 5000 units capsule Take 5,000 Units by mouth daily.   levothyroxine (SYNTHROID) 112 MCG tablet Take 112 mcg by mouth daily before breakfast.   lidocaine (HM LIDOCAINE PATCH) 4 % Place 2 patches onto the skin daily. Apply to lower back topically one time a day.   losartan (COZAAR) 100 MG tablet Take 100 mg by mouth daily.   Menthol, Topical Analgesic, (BIOFREEZE) 4 % GEL Apply 1 Application topically 3 (three) times daily. Apply to lower legs topically as needed for pain.   Multiple Vitamins-Minerals (CENTRUM SILVER PO) Take 1 tablet by mouth daily.    Omega-3 Fatty Acids (OMEGA-3 CF PO) Take 1,000 mg by mouth every morning. With food   Ostomy Supplies (SKIN PREP WIPES) MISC 2 Applications by Does not apply route 2 (two) times daily. Apply to heels topically for skin care treatment.   oxybutynin (DITROPAN XL) 15 MG 24 hr tablet Take 15 mg by mouth daily.   Polyethyl Glycol-Propyl Glycol 0.4-0.3 % SOLN Place 2 drops into both eyes daily as needed (dry eyes).    senna-docusate (SENNA S) 8.6-50 MG tablet Take 1 tablet by mouth daily.   triamcinolone (KENALOG) 0.025 % cream Apply 1 application  topically as needed (Apply to affected area topically).   zinc oxide 20 % ointment Apply 1 application  topically as needed for irritation. Apply to buttocks topically after every incontinent episode for redness   [DISCONTINUED] senna-docusate (SENOKOT-S) 8.6-50 MG tablet Take 1 tablet by mouth daily. As needed for constipation (PRN)   No facility-administered encounter medications on file as of 11/18/2022.    Review of Systems  Immunization History  Administered Date(s) Administered   Fluad Quad(high Dose 65+) 10/02/2022   Influenza, High Dose Seasonal PF 09/17/2017, 09/10/2018, 09/22/2019, 09/13/2021   Influenza, Quadrivalent, Recombinant, Inj, Pf  08/29/2020   Influenza,inj,Quad PF,6+ Mos 09/10/2018   Influenza-Unspecified 08/10/2013, 09/07/2015, 09/13/2016   Moderna Covid-19 Vaccine Bivalent Booster 75yr & up 05/22/2022   Moderna SARS-COV2 Booster Vaccination 05/16/2021   Moderna Sars-Covid-2 Vaccination 12/13/2019, 01/10/2020, 10/23/2020   PFIZER(Purple Top)SARS-COV-2 Vaccination 08/29/2021   Pfizer Covid-19 Vaccine Bivalent Booster 12yr& up 10/15/2022   Pneumococcal Conjugate-13 06/08/2017, 11/02/2018   Pneumococcal Polysaccharide-23 12/09/2002, 06/08/2017, 10/22/2017, 11/02/2018   Pneumococcal-Unspecified 06/08/2017, 11/02/2018   Tdap 02/21/2014, 10/29/2017   Zoster Recombinat (Shingrix) 07/18/2017, 01/10/2018   Zoster, Live 07/18/2017, 01/10/2018   Pertinent  Health Maintenance Due  Topic Date Due   INFLUENZA VACCINE  Completed   DEXA SCAN  Completed      09/06/2020    8:39 AM 03/07/2021    1:03 PM 09/05/2021    8:54  AM 02/18/2022    2:20 PM 11/18/2022    2:34 PM  Fall Risk  Falls in the past year? 0 0 0  0  Was there an injury with Fall?    0 0  Fall Risk Category Calculator     0  Fall Risk Category     Low  Patient Fall Risk Level   Low fall risk Moderate fall risk Moderate fall risk  Patient at Risk for Falls Due to    History of fall(s);Impaired balance/gait;Impaired mobility History of fall(s)  Fall risk Follow up   Falls evaluation completed Falls evaluation completed;Education provided;Falls prevention discussed Falls evaluation completed   Functional Status Survey:    Vitals:   11/18/22 1426  BP: 132/70  Pulse: 85  Resp: (!) 21  Temp: (!) 97.4 F (36.3 C)  Weight: 166 lb 6 oz (75.5 kg)  Height: '5\' 4"'$  (1.626 m)   Body mass index is 28.56 kg/m. Physical Exam  Labs reviewed: Recent Labs    07/29/22 0000 09/19/22 0000 09/23/22 0000  NA 132* 129* 131*  K 4.1 4.3 4.2  CL 97* 94* 97*  CO2 24* 28* 28*  BUN '13 15 13  '$ CREATININE 0.6 0.6 0.7  CALCIUM 9.9 9.6 9.3   Recent Labs     06/03/22 0000 07/26/22 0000  AST 13 15  ALT 9 12  ALKPHOS 67 62  ALBUMIN 3.8 3.9   Recent Labs    06/03/22 0000 07/26/22 0000  WBC 5.8 6.0  NEUTROABS 3,057.00 3,372.00  HGB 12.8 12.8  HCT 38 37  PLT 376 364   Lab Results  Component Value Date   TSH 4.50 07/26/2022   No results found for: "HGBA1C" Lab Results  Component Value Date   CHOL 190 07/26/2020   HDL 56 07/26/2020   LDLCALC 109 (H) 07/26/2020   TRIG 139 07/26/2020   CHOLHDL 3.4 07/26/2020    Significant Diagnostic Results in last 30 days:  No results found.  Assessment/Plan There are no diagnoses linked to this encounter.   Family/ staff Communication: ***  Labs/tests ordered:  ***

## 2022-11-19 ENCOUNTER — Encounter: Payer: Self-pay | Admitting: Adult Health

## 2022-11-19 ENCOUNTER — Non-Acute Institutional Stay (SKILLED_NURSING_FACILITY): Payer: PPO | Admitting: Adult Health

## 2022-11-19 DIAGNOSIS — R2681 Unsteadiness on feet: Secondary | ICD-10-CM | POA: Diagnosis not present

## 2022-11-19 DIAGNOSIS — F5101 Primary insomnia: Secondary | ICD-10-CM

## 2022-11-19 DIAGNOSIS — M6281 Muscle weakness (generalized): Secondary | ICD-10-CM | POA: Diagnosis not present

## 2022-11-19 MED ORDER — MELATONIN 5 MG PO TABS: 5.0000 mg | ORAL_TABLET | Freq: Every day | ORAL | 3 refills | Status: AC

## 2022-11-19 NOTE — Progress Notes (Signed)
Location:  [facility] of Service:    Provider:  Durenda Age, [place], FNP-BC  Fr[facility] Care Team: Virgie Dad, MD as PCP - General (Internal Medicine) Reynold Bowen, MD as Consulting Physician (Endocrinology)  Extended Emergency Contact Information Primary Emergency Contact: Olds,Gordon Address: [address]          [place], [place] [address] [name] of [place] Phone: [phone] Mobile Phone: [phone] Relation: Spouse Secondary Emergency Contact: [name],[name] Address: [address]          [place], [place] [address] [name] of [place] Phone: [phone] Work Phone: [phone] Mobile Phone: [phone] Relation: Son  Code Status:   Full Code  Goals of care: Advanced Directive information    11/18/2022    2:37 PM  Advanced Directives  Does Frances Holland Have a Medical Advance Directive? Yes  Type of Advance Directive Living will;Out of facility DNR (pink MOST or yellow form)  Does Frances Holland want to make changes to medical advance directive? No - Frances Holland declined  Copy of [place] in Chart? Yes - validated most recent copy scanned in chart (See row information)  Pre-existing out of facility DNR order (yellow form or pink MOST form) Pink MOST form placed in chart (order not valid for inpatient use);Yellow form placed in chart (order not valid for inpatient use)     Chief Complaint  Frances Holland presents with   Acute Visit    Can't sleep at night    HPI:  Pt is a 88 y.o. female seen today for an acute visit regarding difficulty sleeping. She was seen in her room today with her husband at bedside. Husband reported that Frances Holland does not sleep until midnight. She drinks coffee for breakfast and lunch.  She is a long-term care resident of [facility]. She has a PMH of breast cancer S/P mastectomy and dementia.   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   Hypertension    Insomnia    Knee pain, bilateral 2014   Lymphedema    Left arm following mastectomy   Memory impairment    Memory loss    Migraine 05/16/2012   Migraine without aura, without mention of intractable migraine without mention of status migrainosus    Frances Holland denies at preop of 08/08/16    Other and unspecified hyperlipidemia    Other bursitis disorders    Other infective bursitis, left shoulder    Pain in joint, ankle and foot    Pain in joint, hand    Pain in joint, lower leg    Pain in joint, shoulder region    Pain in joint, shoulder region 04/23/2016   Pain in limb    Rectal bleeding 08/20/2016   Senile osteoporosis    Symptomatic menopausal or female climacteric states    Syncope and collapse    Unspecified hypertensive heart disease without heart failure 1995   Unspecified hypothyroidism    Unspecified polyarthropathy or polyarthritis, site unspecified    Unspecified urinary incontinence    Unspecified visual loss    Xerophthalmia  Past Surgical History:  Procedure Laterality Date   ABDOMINAL HYSTERECTOMY     BILATERAL OOPHORECTOMY  1981   BREAST RECONSTRUCTION Left 01/2000   BREAST REDUCTION SURGERY Right 01/2000   CATARACT EXTRACTION Right 09/04/2005   COLONOSCOPY  01/2004   MASTECTOMY Left 10/1999   THYROID LOBECTOMY Left 08/14/2016   Procedure: NEAR TOTAL THYROIDECTOMY;  Surgeon: Johnathan Hausen, MD;  Location: [facility];  Service: General;  Laterality: Left;   TOTAL ABDOMINAL HYSTERECTOMY W/ BILATERAL SALPINGOOPHORECTOMY  1981   TOTAL KNEE ARTHROPLASTY Right 2014    Allergies  Allergen Reactions   Donepezil Hcl     Body pain and stiffness    Namenda [Memantine Hcl]       Caused body pain    Sulfa Antibiotics    Sulfamethoxazole Rash    Outpatient Encounter Medications as of 11/19/2022  Medication Sig   acetaminophen (TYLENOL) 325 MG tablet Take 325 mg by mouth 2 (two) times daily. Give 2 tablets (total of 650 mg) for pain.   amLODipine (NORVASC) 2.5 MG tablet Take 7.5 mg by mouth daily.   Cholecalciferol 5000 units capsule Take 5,000 Units by mouth daily.   levothyroxine (SYNTHROID) 112 MCG tablet Take 112 mcg by mouth daily before breakfast.   lidocaine (HM LIDOCAINE PATCH) 4 % Place 2 patches onto the skin daily. Apply to lower back topically one time a day.   losartan (COZAAR) 100 MG tablet Take 100 mg by mouth daily.   Menthol, Topical Analgesic, (BIOFREEZE) 4 % GEL Apply 1 Application topically 3 (three) times daily. Apply to lower legs topically as needed for pain.   Multiple Vitamins-Minerals (CENTRUM SILVER PO) Take 1 tablet by mouth daily.    Omega-3 Fatty Acids (OMEGA-3 CF PO) Take 1,000 mg by mouth every morning. With food   Ostomy Supplies (SKIN PREP WIPES) MISC 2 Applications by Does not apply route 2 (two) times daily. Apply to heels topically for skin care treatment.   oxybutynin (DITROPAN XL) 15 MG 24 hr tablet Take 15 mg by mouth daily.   Polyethyl Glycol-Propyl Glycol 0.4-0.3 % SOLN Place 2 drops into both eyes daily as needed (dry eyes).    senna-docusate (SENNA S) 8.6-50 MG tablet Take 1 tablet by mouth daily.   triamcinolone (KENALOG) 0.025 % cream Apply 1 application  topically as needed (Apply to affected area topically).   zinc oxide 20 % ointment Apply 1 application  topically as needed for irritation. Apply to buttocks topically after every incontinent episode for redness   No facility-administered encounter medications on file as of 11/19/2022.    Review of Systems  Constitutional:  Negative for appetite change, chills, fatigue and fever.  HENT:  Negative for congestion, hearing loss, rhinorrhea and sore throat.   Eyes:  Negative.   Respiratory:  Negative for cough, shortness of breath and wheezing.   Cardiovascular:  Negative for chest pain, palpitations and leg swelling.  Gastrointestinal:  Negative for abdominal pain, constipation, diarrhea, nausea and vomiting.  Genitourinary:  Negative for dysuria.  Musculoskeletal:  Negative for arthralgias, back pain and myalgias.  Skin:  Negative for color change, rash and wound.  Neurological:  Negative for dizziness, weakness and headaches.  Psychiatric/Behavioral:  Positive for sleep disturbance. Negative for behavioral problems. The Frances Holland is not nervous/anxious.        Immunization History  Administered Date(s) Administered   Fluad Quad(high Dose 65+) 10/02/2022   Influenza, High Dose Seasonal PF 09/17/2017, 09/10/2018, 09/22/2019, 09/13/2021   Influenza, Quadrivalent, Recombinant, Inj, Pf 08/29/2020  Influenza,inj,Quad PF,6+ Mos 09/10/2018   Influenza-Unspecified 08/10/2013, 09/07/2015, 09/13/2016   Moderna Covid-19 Vaccine Bivalent Booster 75yr & up 05/22/2022   Moderna SARS-COV2 Booster Vaccination 05/16/2021   Moderna Sars-Covid-2 Vaccination 12/13/2019, 01/10/2020, 10/23/2020   PFIZER(Purple Top)SARS-COV-2 Vaccination 08/29/2021   Pfizer Covid-19 Vaccine Bivalent Booster 139yr& up 10/15/2022   Pneumococcal Conjugate-13 06/08/2017, 11/02/2018   Pneumococcal Polysaccharide-23 12/09/2002, 06/08/2017, 10/22/2017, 11/02/2018   Pneumococcal-Unspecified 06/08/2017, 11/02/2018   Tdap 02/21/2014, 10/29/2017   Zoster Recombinat (Shingrix) 07/18/2017, 01/10/2018   Zoster, Live 07/18/2017, 01/10/2018   Pertinent  Health Maintenance Due  Topic Date Due   INFLUENZA VACCINE  Completed   DEXA SCAN  Completed      09/06/2020    8:39 AM 03/07/2021    1:03 PM 09/05/2021    8:54 AM 02/18/2022    2:20 PM 11/18/2022    2:34 PM  Fall Risk  Falls in the past year? 0 0 0  0  Was there an injury with Fall?    0 0  Fall Risk Category Calculator     0  Fall Risk  Category     Low  Frances Holland Fall Risk Level   Low fall risk Moderate fall risk Moderate fall risk  Frances Holland at Risk for Falls Due to    History of fall(s);Impaired balance/gait;Impaired mobility History of fall(s)  Fall risk Follow up   Falls evaluation completed Falls evaluation completed;Education provided;Falls prevention discussed Falls evaluation completed     Vitals:   11/19/22 1641  BP: 132/70  Pulse: 74  Resp: (!) 21  Temp: (!) 97.4 F (36.3 C)  SpO2: 99%  Weight: 166 lb 9.6 oz (75.6 kg)   Body mass index is 28.6 kg/m.  Physical Exam Constitutional:      Appearance: Normal appearance.  HENT:     Head: Normocephalic and atraumatic.     Nose: Nose normal.     Mouth/Throat:     Mouth: Mucous membranes are moist.  Eyes:     Conjunctiva/sclera: Conjunctivae normal.  Cardiovascular:     Rate and Rhythm: Normal rate and regular rhythm.  Pulmonary:     Effort: Pulmonary effort is normal.     Breath sounds: Normal breath sounds.  Abdominal:     General: Bowel sounds are normal.     Palpations: Abdomen is soft.  Musculoskeletal:        General: Normal range of motion.     Cervical back: Normal range of motion.  Skin:    General: Skin is warm and dry.  Neurological:     General: No focal deficit present.     Mental Status: She is alert. She is disoriented.     Comments: Alert to self, disoriented to time and place.  Psychiatric:        Mood and Affect: Mood normal.        Behavior: Behavior normal.        Thought Content: Thought content normal.        Judgment: Judgment normal.        Labs reviewed: Recent Labs    07/29/22 0000 09/19/22 0000 09/23/22 0000  NA 132* 129* 131*  K 4.1 4.3 4.2  CL 97* 94* 97*  CO2 24* 28* 28*  BUN '13 15 13  '$ CREATININE 0.6 0.6 0.7  CALCIUM 9.9 9.6 9.3   Recent Labs    06/03/22 0000 07/26/22 0000  AST 13 15  ALT 9 12  ALKPHOS 67 62  ALBUMIN 3.8 3.9   Recent  Labs    06/03/22 0000 07/26/22 0000  WBC 5.8 6.0   NEUTROABS 3,057.00 3,372.00  HGB 12.8 12.8  HCT 38 37  PLT 376 364   Lab Results  Component Value Date   TSH 4.50 07/26/2022   No results found for: "HGBA1C" Lab Results  Component Value Date   CHOL 190 07/26/2020   HDL 56 07/26/2020   LDLCALC 109 (H) 07/26/2020   TRIG 139 07/26/2020   CHOLHDL 3.4 07/26/2020    Significant Diagnostic Results in last 30 days:  No results found.  Assessment/Plan  1. Primary insomnia -  will start on Melatonin -  instructed to cut down on coffee intake to once a day instead of twice a day - melatonin 5 MG TABS; Take 1 tablet (5 mg total) by mouth at bedtime.  Dispense: 30 tablet; Refill: 3    Family/ staff Communication: Discussed plan of care with resident, husband and charge nurse  Labs/tests ordered: None    Durenda Age, DNP, MSN, FNP-BC [facility] [phone] (Monday-Friday 8:00 a.m. - 5:00 p.m.) [phone] (after hours)

## 2022-11-21 DIAGNOSIS — M6281 Muscle weakness (generalized): Secondary | ICD-10-CM | POA: Diagnosis not present

## 2022-11-21 DIAGNOSIS — R2681 Unsteadiness on feet: Secondary | ICD-10-CM | POA: Diagnosis not present

## 2022-11-25 DIAGNOSIS — R2681 Unsteadiness on feet: Secondary | ICD-10-CM | POA: Diagnosis not present

## 2022-11-25 DIAGNOSIS — M6281 Muscle weakness (generalized): Secondary | ICD-10-CM | POA: Diagnosis not present

## 2022-11-26 DIAGNOSIS — R2681 Unsteadiness on feet: Secondary | ICD-10-CM | POA: Diagnosis not present

## 2022-11-26 DIAGNOSIS — M6281 Muscle weakness (generalized): Secondary | ICD-10-CM | POA: Diagnosis not present

## 2022-11-27 DIAGNOSIS — R2681 Unsteadiness on feet: Secondary | ICD-10-CM | POA: Diagnosis not present

## 2022-11-27 DIAGNOSIS — M6281 Muscle weakness (generalized): Secondary | ICD-10-CM | POA: Diagnosis not present

## 2022-11-28 DIAGNOSIS — M6281 Muscle weakness (generalized): Secondary | ICD-10-CM | POA: Diagnosis not present

## 2022-11-28 DIAGNOSIS — R2681 Unsteadiness on feet: Secondary | ICD-10-CM | POA: Diagnosis not present

## 2022-12-04 DIAGNOSIS — M6281 Muscle weakness (generalized): Secondary | ICD-10-CM | POA: Diagnosis not present

## 2022-12-04 DIAGNOSIS — R2681 Unsteadiness on feet: Secondary | ICD-10-CM | POA: Diagnosis not present

## 2022-12-06 DIAGNOSIS — R2681 Unsteadiness on feet: Secondary | ICD-10-CM | POA: Diagnosis not present

## 2022-12-06 DIAGNOSIS — M6281 Muscle weakness (generalized): Secondary | ICD-10-CM | POA: Diagnosis not present

## 2022-12-09 DIAGNOSIS — R2681 Unsteadiness on feet: Secondary | ICD-10-CM | POA: Diagnosis not present

## 2022-12-09 DIAGNOSIS — M6281 Muscle weakness (generalized): Secondary | ICD-10-CM | POA: Diagnosis not present

## 2022-12-10 DIAGNOSIS — R2681 Unsteadiness on feet: Secondary | ICD-10-CM | POA: Diagnosis not present

## 2022-12-10 DIAGNOSIS — M6281 Muscle weakness (generalized): Secondary | ICD-10-CM | POA: Diagnosis not present

## 2022-12-12 DIAGNOSIS — R2681 Unsteadiness on feet: Secondary | ICD-10-CM | POA: Diagnosis not present

## 2022-12-12 DIAGNOSIS — M6281 Muscle weakness (generalized): Secondary | ICD-10-CM | POA: Diagnosis not present

## 2022-12-16 ENCOUNTER — Encounter: Payer: Self-pay | Admitting: Orthopedic Surgery

## 2022-12-16 ENCOUNTER — Non-Acute Institutional Stay (SKILLED_NURSING_FACILITY): Payer: PPO | Admitting: Orthopedic Surgery

## 2022-12-16 DIAGNOSIS — G301 Alzheimer's disease with late onset: Secondary | ICD-10-CM

## 2022-12-16 DIAGNOSIS — M6281 Muscle weakness (generalized): Secondary | ICD-10-CM | POA: Diagnosis not present

## 2022-12-16 DIAGNOSIS — E871 Hypo-osmolality and hyponatremia: Secondary | ICD-10-CM

## 2022-12-16 DIAGNOSIS — E890 Postprocedural hypothyroidism: Secondary | ICD-10-CM | POA: Diagnosis not present

## 2022-12-16 DIAGNOSIS — M810 Age-related osteoporosis without current pathological fracture: Secondary | ICD-10-CM | POA: Diagnosis not present

## 2022-12-16 DIAGNOSIS — H6123 Impacted cerumen, bilateral: Secondary | ICD-10-CM | POA: Diagnosis not present

## 2022-12-16 DIAGNOSIS — R2681 Unsteadiness on feet: Secondary | ICD-10-CM | POA: Diagnosis not present

## 2022-12-16 DIAGNOSIS — M25551 Pain in right hip: Secondary | ICD-10-CM | POA: Diagnosis not present

## 2022-12-16 DIAGNOSIS — I10 Essential (primary) hypertension: Secondary | ICD-10-CM

## 2022-12-16 DIAGNOSIS — N3946 Mixed incontinence: Secondary | ICD-10-CM

## 2022-12-16 DIAGNOSIS — F0280 Dementia in other diseases classified elsewhere without behavioral disturbance: Secondary | ICD-10-CM

## 2022-12-16 MED ORDER — DEBROX 6.5 % OT SOLN: 5.0000 [drp] | Freq: Every evening | OTIC | 0 refills | Status: AC

## 2022-12-16 NOTE — Progress Notes (Signed)
Location:  [facility] Room Number: 25A Place of Service:  SNF [phone]) Provider:  Windell Moulding, NP   Patient Care Team: Virgie Dad, MD as PCP - General (Internal Medicine) Reynold Bowen, MD as Consulting Physician (Endocrinology)  Extended Emergency Contact Information Primary Emergency Contact: Drach,Gordon Address: [address] [place]          [place], [place] [address] [name] of [place] Phone: [phone] Mobile Phone: [phone] Relation: Spouse Secondary Emergency Contact: [name],[name] Address: [address]          [place], [place] [address] [name] of [place] Phone: [phone] Work Phone: [phone] Mobile Phone: [phone] Relation: Son  Code Status:  DNR Goals of care: Advanced Directive information    12/16/2022   10:20 AM  Advanced Directives  Does Patient Have a Medical Advance Directive? Yes  Type of Advance Directive Living will;Out of facility DNR (pink MOST or yellow form)  Does patient want to make changes to medical advance directive? No - Patient declined  Copy of [place] in Chart? Yes - validated most recent copy scanned in chart (See row information)  Pre-existing out of facility DNR order (yellow form or pink MOST form) Pink MOST form placed in chart (order not valid for inpatient use);Yellow form placed in chart (order not valid for inpatient use)     Chief Complaint  Patient presents with   Medical Management of Chronic Issues    Routine Visit    Quality Metric Gaps    Needs to discuss Covid 19 vaccine    HPI:  Pt is a 89 y.o. female seen today for medical management of chronic diseases.    She currently resides on the skilled nursing unit at [facility]. [place]: HTN, migraines, rectal bleeding, hypothyroidism, s/p thyroidectomy (left) 2017, dementia, osteoporosis, constipation, h/o breast and skin cancer,  abnormal gait and insomnia.   HTN- BUN/creat 13/0.7  09/23/2022, remains on amlodipine and losartan Hypothyroidism- s/p left lobe thyroidectomy 2017, TSH 4.50 07/26/2022, remains on levothyroxine  Dementia- MMSE 23/30 02/2022, no behavioral outbursts, moved to [facility] 10/2022, ambulates with walker/wheelchair, unable to tolerate Namenda/Aricept in past/ rash with Exelon patch, not on medication Unstable gait- no recent falls, uses wheelchair for long distances Right hip pain- past xray suggest severe arthritis, remains on tylenol and lidocaine patches Senile osteoporosis- DEXA 09/2021, remains on vitamin D Urinary incontinence- stable with oxybutynin Hyponatremia- Na + 131 09/23/2022  Recent blood pressures:  01/02- 142/78  12/26- 133/75  12/19- 145/85  Recent weights:  01/06- 171.1 lbs  12/27- 168.5 lbs  12/02- 166.6 lbs  11/01- 167.8 lbs    Past Medical History:  Diagnosis Date   Back pain    Breast cancer ([place])    Cancer ([place])    left breaset mastectomy    Candidiasis of skin and nails    Cellulitis and abscess of upper arm and forearm    Heartburn    Hemorrhoids 08/20/2016   History of breast cancer 2000   History left mastectomy   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   Hypertension    Insomnia    Knee pain, bilateral 2014   Lymphedema    Left arm following mastectomy   Memory impairment    Memory loss    Migraine 05/16/2012   Migraine without aura, without mention of intractable migraine without mention of status migrainosus    patient denies at preop of 08/08/16    Other and unspecified  hyperlipidemia    Other bursitis disorders    Other infective bursitis, left shoulder    Pain in joint, ankle and foot    Pain in joint, hand    Pain in joint, lower leg    Pain in joint, shoulder region    Pain in joint, shoulder region 04/23/2016   Pain in limb    Rectal bleeding 08/20/2016   Senile osteoporosis    Symptomatic menopausal or female climacteric states    Syncope and collapse    Unspecified hypertensive  heart disease without heart failure 1995   Unspecified hypothyroidism    Unspecified polyarthropathy or polyarthritis, site unspecified    Unspecified urinary incontinence    Unspecified visual loss    Xerophthalmia    Past Surgical History:  Procedure Laterality Date   ABDOMINAL HYSTERECTOMY     BILATERAL OOPHORECTOMY  1981   BREAST RECONSTRUCTION Left 01/2000   BREAST REDUCTION SURGERY Right 01/2000   CATARACT EXTRACTION Right 09/04/2005   COLONOSCOPY  01/2004   MASTECTOMY Left 10/1999   THYROID LOBECTOMY Left 08/14/2016   Procedure: NEAR TOTAL THYROIDECTOMY;  Surgeon: Johnathan Hausen, MD;  Location: [facility];  Service: General;  Laterality: Left;   TOTAL ABDOMINAL HYSTERECTOMY W/ BILATERAL SALPINGOOPHORECTOMY  1981   TOTAL KNEE ARTHROPLASTY Right 2014    Allergies  Allergen Reactions   Donepezil Hcl     Body pain and stiffness    Namenda [Memantine Hcl]      Caused body pain    Sulfa Antibiotics    Sulfamethoxazole Rash    Outpatient Encounter Medications as of 12/16/2022  Medication Sig   acetaminophen (TYLENOL) 325 MG tablet Take 325 mg by mouth 2 (two) times daily. Give 2 tablets (total of 650 mg) for pain.   amLODipine (NORVASC) 2.5 MG tablet Take 7.5 mg by mouth daily.   Cholecalciferol 5000 units capsule Take 5,000 Units by mouth daily.   levothyroxine (SYNTHROID) 112 MCG tablet Take 112 mcg by mouth daily before breakfast.   lidocaine (HM LIDOCAINE PATCH) 4 % Place 2 patches onto the skin daily. Apply to lower back topically one time a day.   losartan (COZAAR) 100 MG tablet Take 100 mg by mouth daily.   melatonin 5 MG TABS Take 1 tablet (5 mg total) by mouth at bedtime.   Menthol, Topical Analgesic, (BIOFREEZE) 4 % GEL Apply 1 Application topically 3 (three) times daily. Apply to lower legs topically as needed for pain.   Multiple Vitamins-Minerals (CENTRUM SILVER PO) Take 1 tablet by mouth daily.    Omega-3 Fatty Acids (OMEGA-3 CF PO) Take 1,000 mg by mouth every morning.  With food   Ostomy Supplies (SKIN PREP WIPES) MISC 2 Applications by Does not apply route 2 (two) times daily. Apply to heels topically for skin care treatment.   oxybutynin (DITROPAN XL) 15 MG 24 hr tablet Take 15 mg by mouth daily.   Polyethyl Glycol-Propyl Glycol 0.4-0.3 % SOLN Place 2 drops into both eyes daily as needed (dry eyes).    senna-docusate (SENNA S) 8.6-50 MG tablet Take 1 tablet by mouth daily.   triamcinolone (KENALOG) 0.025 % cream Apply 1 application  topically as needed (Apply to affected area topically).   zinc oxide 20 % ointment Apply 1 application  topically as needed for irritation. Apply to buttocks topically after every incontinent episode for redness   No facility-administered encounter medications on file as of 12/16/2022.    Review of Systems  Unable to perform ROS: Dementia  Immunization History  Administered Date(s) Administered   Fluad Quad(high Dose 65+) 10/02/2022   Influenza, High Dose Seasonal PF 09/17/2017, 09/10/2018, 09/22/2019, 09/13/2021   Influenza, Quadrivalent, Recombinant, Inj, Pf 08/29/2020   Influenza,inj,Quad PF,6+ Mos 09/10/2018   Influenza-Unspecified 08/10/2013, 09/07/2015, 09/13/2016   Moderna Covid-19 Vaccine Bivalent Booster 42yr & up 05/22/2022   Moderna SARS-COV2 Booster Vaccination 05/16/2021   Moderna Sars-Covid-2 Vaccination 12/13/2019, 01/10/2020, 10/23/2020   PFIZER(Purple Top)SARS-COV-2 Vaccination 08/29/2021   Pfizer Covid-19 Vaccine Bivalent Booster 137yr& up 10/15/2022   Pneumococcal Conjugate-13 06/08/2017, 11/02/2018   Pneumococcal Polysaccharide-23 12/09/2002, 06/08/2017, 10/22/2017, 11/02/2018   Pneumococcal-Unspecified 06/08/2017, 11/02/2018   Tdap 02/21/2014, 10/29/2017   Zoster Recombinat (Shingrix) 07/18/2017, 01/10/2018   Zoster, Live 07/18/2017, 01/10/2018   Pertinent  Health Maintenance Due  Topic Date Due   INFLUENZA VACCINE  Completed   DEXA SCAN  Completed      03/07/2021    1:03 PM 09/05/2021     8:54 AM 02/18/2022    2:20 PM 11/18/2022    2:34 PM 12/16/2022   10:20 AM  Fall Risk  Falls in the past year? 0 0  0 0  Was there an injury with Fall?   0 0 0  Fall Risk Category Calculator    0 0  Fall Risk Category    Low Low  Patient Fall Risk Level  Low fall risk Moderate fall risk Moderate fall risk Moderate fall risk  Patient at Risk for Falls Due to   History of fall(s);Impaired balance/gait;Impaired mobility History of fall(s) History of fall(s)  Fall risk Follow up  Falls evaluation completed Falls evaluation completed;Education provided;Falls prevention discussed Falls evaluation completed Falls evaluation completed   Functional Status Survey:    Vitals:   12/16/22 1014  BP: (!) 142/78  Pulse: 89  Resp: 19  Temp: (!) 97.2 F (36.2 C)  SpO2: 99%  Weight: 171 lb 1 oz (77.6 kg)  Height: '5\' 4"'$  (1.626 m)   Body mass index is 29.36 kg/m. Physical Exam Vitals reviewed.  Constitutional:      General: She is not in acute distress. HENT:     Head: Normocephalic.     Right Ear: There is impacted cerumen.     Left Ear: There is impacted cerumen.     Nose: Nose normal.     Mouth/Throat:     Mouth: Mucous membranes are moist.  Eyes:     General:        Right eye: No discharge.        Left eye: No discharge.  Neck:     Vascular: No carotid bruit.  Cardiovascular:     Rate and Rhythm: Normal rate and regular rhythm.     Pulses: Normal pulses.     Heart sounds: Normal heart sounds.  Pulmonary:     Effort: Pulmonary effort is normal. No respiratory distress.     Breath sounds: Normal breath sounds. No wheezing.  Musculoskeletal:     Cervical back: Neck supple.     Right lower leg: No edema.     Left lower leg: No edema.  Lymphadenopathy:     Cervical: No cervical adenopathy.  Skin:    General: Skin is warm and dry.     Capillary Refill: Capillary refill takes less than 2 seconds.  Neurological:     General: No focal deficit present.     Mental Status: She is  alert. Mental status is at baseline.     Motor: Weakness present.  Gait: Gait abnormal.     Comments: Rolator/wheelchair  Psychiatric:        Mood and Affect: Mood normal.        Behavior: Behavior normal.     Labs reviewed: Recent Labs    07/29/22 0000 09/19/22 0000 09/23/22 0000  NA 132* 129* 131*  K 4.1 4.3 4.2  CL 97* 94* 97*  CO2 24* 28* 28*  BUN '13 15 13  '$ CREATININE 0.6 0.6 0.7  CALCIUM 9.9 9.6 9.3   Recent Labs    06/03/22 0000 07/26/22 0000  AST 13 15  ALT 9 12  ALKPHOS 67 62  ALBUMIN 3.8 3.9   Recent Labs    06/03/22 0000 07/26/22 0000  WBC 5.8 6.0  NEUTROABS 3,057.00 3,372.00  HGB 12.8 12.8  HCT 38 37  PLT 376 364   Lab Results  Component Value Date   TSH 4.50 07/26/2022   No results found for: "HGBA1C" Lab Results  Component Value Date   CHOL 190 07/26/2020   HDL 56 07/26/2020   LDLCALC 109 (H) 07/26/2020   TRIG 139 07/26/2020   CHOLHDL 3.4 07/26/2020    Significant Diagnostic Results in last 30 days:  No results found.  Assessment/Plan 1. Bilateral impacted cerumen - cannot visualize TM due to cerumen - start debrox- 5 gtts to both ears qhs x 5 days - flush ears with warm water after debrox complete  2. Primary hypertension - controlled - cont losartan  3. Postoperative hypothyroidism - TSH stable - cont levothyroxine  4. Late onset Alzheimer's 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 ([place]) - no behaviors, poor short term memory - moved to [facility] 10/2022 - not on medication  5. Unstable gait - no recent falls - ambulates with wheelchair/rollator  6. Right hip pain - ongoing - recent xray indicated severe arthritis - cont tylenol and lidocaine patches  7. Senile osteoporosis - DEXA 09/2021, t score -1.8 - cont vitamin D  8. Mixed stress and urge urinary incontinence - cont oxybutynin  9. Hyponatremia - Na + stable - not on sodium tablets                              Family/ staff Communication: plan  discussed with patient and nurse  Labs/tests ordered:  none

## 2022-12-17 DIAGNOSIS — R2681 Unsteadiness on feet: Secondary | ICD-10-CM | POA: Diagnosis not present

## 2022-12-17 DIAGNOSIS — M6281 Muscle weakness (generalized): Secondary | ICD-10-CM | POA: Diagnosis not present

## 2022-12-19 DIAGNOSIS — M6281 Muscle weakness (generalized): Secondary | ICD-10-CM | POA: Diagnosis not present

## 2022-12-19 DIAGNOSIS — R2681 Unsteadiness on feet: Secondary | ICD-10-CM | POA: Diagnosis not present

## 2022-12-22 DIAGNOSIS — R2681 Unsteadiness on feet: Secondary | ICD-10-CM | POA: Diagnosis not present

## 2022-12-22 DIAGNOSIS — M6281 Muscle weakness (generalized): Secondary | ICD-10-CM | POA: Diagnosis not present

## 2022-12-24 DIAGNOSIS — M6281 Muscle weakness (generalized): Secondary | ICD-10-CM | POA: Diagnosis not present

## 2022-12-24 DIAGNOSIS — R2681 Unsteadiness on feet: Secondary | ICD-10-CM | POA: Diagnosis not present

## 2022-12-26 DIAGNOSIS — R2681 Unsteadiness on feet: Secondary | ICD-10-CM | POA: Diagnosis not present

## 2022-12-26 DIAGNOSIS — M6281 Muscle weakness (generalized): Secondary | ICD-10-CM | POA: Diagnosis not present

## 2022-12-27 DIAGNOSIS — M79671 Pain in right foot: Secondary | ICD-10-CM | POA: Diagnosis not present

## 2022-12-27 DIAGNOSIS — M79672 Pain in left foot: Secondary | ICD-10-CM | POA: Diagnosis not present

## 2022-12-27 DIAGNOSIS — L602 Onychogryphosis: Secondary | ICD-10-CM | POA: Diagnosis not present

## 2022-12-30 DIAGNOSIS — M6281 Muscle weakness (generalized): Secondary | ICD-10-CM | POA: Diagnosis not present

## 2022-12-30 DIAGNOSIS — R2681 Unsteadiness on feet: Secondary | ICD-10-CM | POA: Diagnosis not present

## 2023-01-01 DIAGNOSIS — M6281 Muscle weakness (generalized): Secondary | ICD-10-CM | POA: Diagnosis not present

## 2023-01-01 DIAGNOSIS — R2681 Unsteadiness on feet: Secondary | ICD-10-CM | POA: Diagnosis not present

## 2023-01-02 DIAGNOSIS — M6281 Muscle weakness (generalized): Secondary | ICD-10-CM | POA: Diagnosis not present

## 2023-01-02 DIAGNOSIS — R2681 Unsteadiness on feet: Secondary | ICD-10-CM | POA: Diagnosis not present

## 2023-01-06 DIAGNOSIS — M6281 Muscle weakness (generalized): Secondary | ICD-10-CM | POA: Diagnosis not present

## 2023-01-06 DIAGNOSIS — R2681 Unsteadiness on feet: Secondary | ICD-10-CM | POA: Diagnosis not present

## 2023-01-08 DIAGNOSIS — M6281 Muscle weakness (generalized): Secondary | ICD-10-CM | POA: Diagnosis not present

## 2023-01-08 DIAGNOSIS — R2681 Unsteadiness on feet: Secondary | ICD-10-CM | POA: Diagnosis not present

## 2023-01-09 DIAGNOSIS — M6281 Muscle weakness (generalized): Secondary | ICD-10-CM | POA: Diagnosis not present

## 2023-01-09 DIAGNOSIS — R2681 Unsteadiness on feet: Secondary | ICD-10-CM | POA: Diagnosis not present

## 2023-01-15 DIAGNOSIS — R2681 Unsteadiness on feet: Secondary | ICD-10-CM | POA: Diagnosis not present

## 2023-01-15 DIAGNOSIS — M6281 Muscle weakness (generalized): Secondary | ICD-10-CM | POA: Diagnosis not present

## 2023-01-16 ENCOUNTER — Encounter: Payer: Self-pay | Admitting: Internal Medicine

## 2023-01-16 ENCOUNTER — Non-Acute Institutional Stay (SKILLED_NURSING_FACILITY): Payer: PPO | Admitting: Internal Medicine

## 2023-01-16 DIAGNOSIS — M25551 Pain in right hip: Secondary | ICD-10-CM

## 2023-01-16 DIAGNOSIS — G301 Alzheimer's disease with late onset: Secondary | ICD-10-CM

## 2023-01-16 DIAGNOSIS — E890 Postprocedural hypothyroidism: Secondary | ICD-10-CM

## 2023-01-16 DIAGNOSIS — N3946 Mixed incontinence: Secondary | ICD-10-CM

## 2023-01-16 DIAGNOSIS — I10 Essential (primary) hypertension: Secondary | ICD-10-CM | POA: Diagnosis not present

## 2023-01-16 DIAGNOSIS — F0280 Dementia in other diseases classified elsewhere without behavioral disturbance: Secondary | ICD-10-CM | POA: Diagnosis not present

## 2023-01-16 NOTE — Progress Notes (Signed)
Location:  [facility] Room Number: 25A Place of Service:  SNF [phone]) Provider:  Virgie Dad, MD   Virgie Dad, MD  Patient Care Team: Virgie Dad, MD as PCP - General (Internal Medicine) Reynold Bowen, MD as Consulting Physician (Endocrinology)  Extended Emergency Contact Information Primary Emergency Contact: Pen,Gordon Address: [address]          [place], [place] [address] [name] of [place] Phone: [phone] Mobile Phone: [phone] Relation: Spouse Secondary Emergency Contact: [name],[name] Address: [address]          [place], [place] [address] [name] of [place] Phone: [phone] Work Phone: [phone] Mobile Phone: [phone] Relation: Son  Code Status:  DNR  Goals of care: Advanced Directive information    01/16/2023    1:43 PM  Advanced Directives  Does Patient Have a Medical Advance Directive? Yes  Type of Advance Directive Living will;Out of facility DNR (pink MOST or yellow form)  Does patient want to make changes to medical advance directive? No - Patient declined  Copy of [place] in Chart? Yes - validated most recent copy scanned in chart (See row information)  Pre-existing out of facility DNR order (yellow form or pink MOST form) Pink MOST form placed in chart (order not valid for inpatient use);Yellow form placed in chart (order not valid for inpatient use)     Chief Complaint  Patient presents with   Medical Management of Chronic Issues    Routine visit   Quality Metric Gaps    Discussed the need for AWV patient not due until 02/20/2023    HPI:  Pt is a 89 y.o. female seen today for medical management of chronic diseases.    Lives in SNF in [facility]  Patient was living in [place] with her husband but is not transferred to SNF due to unable to perform her ADLs due to right hip pain   Patient has h/o Hypertension, Hypothyroidism, Insomnia and Cognitive  impairment. Also Has h/o Skin Cancer and Thyroid Cancer   Right hip pain Had x-rays done recently which show severe arthritis in her right hip. Continues to be her issue Family decided not to pursue with ortho  Urinary incontinence mixed has failed pessary.And doing better in SNF   Cognitive impairment has not tolerated Namenda and Aricept Now needs Cueing and Help No Behaviors Walking with her walker Wheelchair for long distance Transfers with Mild assist Wt Readings from Last 3 Encounters:  01/16/23 171 lb 1.6 oz (77.6 kg)  12/16/22 171 lb 1 oz (77.6 kg)  11/19/22 166 lb 9.6 oz (75.6 kg)     Past Medical History:  Diagnosis Date   Back pain    Breast cancer ([place])    Cancer ([place])    left breaset mastectomy    Candidiasis of skin and nails    Cellulitis and abscess of upper arm and forearm    Heartburn    Hemorrhoids 08/20/2016   History of breast cancer 2000   History left mastectomy   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   Hypertension    Insomnia    Knee pain, bilateral 2014   Lymphedema    Left arm following mastectomy   Memory impairment    Memory loss    Migraine 05/16/2012   Migraine without aura, without mention of intractable migraine without mention of status migrainosus    patient denies at preop of 08/08/16    Other  and unspecified hyperlipidemia    Other bursitis disorders    Other infective bursitis, left shoulder    Pain in joint, ankle and foot    Pain in joint, hand    Pain in joint, lower leg    Pain in joint, shoulder region    Pain in joint, shoulder region 04/23/2016   Pain in limb    Rectal bleeding 08/20/2016   Senile osteoporosis    Symptomatic menopausal or female climacteric states    Syncope and collapse    Unspecified hypertensive heart disease without heart failure 1995   Unspecified hypothyroidism    Unspecified polyarthropathy or polyarthritis, site unspecified    Unspecified urinary incontinence    Unspecified visual  loss    Xerophthalmia    Past Surgical History:  Procedure Laterality Date   ABDOMINAL HYSTERECTOMY     BILATERAL OOPHORECTOMY  1981   BREAST RECONSTRUCTION Left 01/2000   BREAST REDUCTION SURGERY Right 01/2000   CATARACT EXTRACTION Right 09/04/2005   COLONOSCOPY  01/2004   MASTECTOMY Left 10/1999   THYROID LOBECTOMY Left 08/14/2016   Procedure: NEAR TOTAL THYROIDECTOMY;  Surgeon: Johnathan Hausen, MD;  Location: [facility];  Service: General;  Laterality: Left;   TOTAL ABDOMINAL HYSTERECTOMY W/ BILATERAL SALPINGOOPHORECTOMY  1981   TOTAL KNEE ARTHROPLASTY Right 2014    Allergies  Allergen Reactions   Donepezil Hcl     Body pain and stiffness    Namenda [Memantine Hcl]      Caused body pain    Sulfa Antibiotics    Sulfamethoxazole Rash    Outpatient Encounter Medications as of 01/16/2023  Medication Sig   acetaminophen (TYLENOL) 325 MG tablet Take 325 mg by mouth 2 (two) times daily. Give 2 tablets (total of 650 mg) for pain.   amLODipine (NORVASC) 2.5 MG tablet Take 7.5 mg by mouth daily.   Cholecalciferol 5000 units capsule Take 5,000 Units by mouth daily.   levothyroxine (SYNTHROID) 112 MCG tablet Take 112 mcg by mouth daily before breakfast.   lidocaine (HM LIDOCAINE PATCH) 4 % Place 2 patches onto the skin daily. Apply to lower back topically one time a day.   losartan (COZAAR) 100 MG tablet Take 100 mg by mouth daily.   melatonin 5 MG TABS Take 1 tablet (5 mg total) by mouth at bedtime.   Menthol, Topical Analgesic, (BIOFREEZE) 4 % GEL Apply 1 Application topically 3 (three) times daily. Apply to lower legs topically as needed for pain.   Multiple Vitamins-Minerals (CENTRUM SILVER PO) Take 1 tablet by mouth daily.    Omega-3 Fatty Acids (OMEGA-3 CF PO) Take 1,000 mg by mouth every morning. With food   Ostomy Supplies (SKIN PREP WIPES) MISC 2 Applications by Does not apply route 2 (two) times daily. Apply to heels topically for skin care treatment.   oxybutynin (DITROPAN XL) 15 MG 24  hr tablet Take 15 mg by mouth daily.   Polyethyl Glycol-Propyl Glycol 0.4-0.3 % SOLN Place 2 drops into both eyes daily as needed (dry eyes).    senna-docusate (SENNA S) 8.6-50 MG tablet Take 1 tablet by mouth daily.   triamcinolone (KENALOG) 0.025 % cream Apply 1 application  topically as needed (Apply to affected area topically).   zinc oxide 20 % ointment Apply 1 application  topically as needed for irritation. Apply to buttocks topically after every incontinent episode for redness   No facility-administered encounter medications on file as of 01/16/2023.    Review of Systems  Constitutional:  Negative for activity  change and appetite change.  HENT: Negative.    Respiratory:  Negative for cough and shortness of breath.   Cardiovascular:  Negative for leg swelling.  Gastrointestinal:  Negative for constipation.  Genitourinary:  Positive for frequency.  Musculoskeletal:  Positive for arthralgias and gait problem. Negative for myalgias.  Skin: Negative.   Neurological:  Negative for dizziness and weakness.  Psychiatric/Behavioral:  Positive for confusion. Negative for dysphoric mood and sleep disturbance.     Immunization History  Administered Date(s) Administered   Fluad Quad(high Dose 65+) 10/02/2022   Influenza, High Dose Seasonal PF 09/17/2017, 09/10/2018, 09/22/2019, 09/13/2021   Influenza, Quadrivalent, Recombinant, Inj, Pf 08/29/2020   Influenza,inj,Quad PF,6+ Mos 09/10/2018   Influenza-Unspecified 08/10/2013, 09/07/2015, 09/13/2016   Moderna Covid-19 Vaccine Bivalent Booster 21yr & up 05/22/2022   Moderna SARS-COV2 Booster Vaccination 05/16/2021   Moderna Sars-Covid-2 Vaccination 12/13/2019, 01/10/2020, 10/23/2020   PFIZER(Purple Top)SARS-COV-2 Vaccination 08/29/2021   Pfizer Covid-19 Vaccine Bivalent Booster 198yr& up 10/15/2022   Pneumococcal Conjugate-13 06/08/2017, 11/02/2018   Pneumococcal Polysaccharide-23 12/09/2002, 06/08/2017, 10/22/2017, 11/02/2018    Pneumococcal-Unspecified 06/08/2017, 11/02/2018   Tdap 02/21/2014, 10/29/2017   Zoster Recombinat (Shingrix) 07/18/2017, 01/10/2018   Zoster, Live 07/18/2017, 01/10/2018   Pertinent  Health Maintenance Due  Topic Date Due   INFLUENZA VACCINE  Completed   DEXA SCAN  Completed      03/07/2021    1:03 PM 09/05/2021    8:54 AM 02/18/2022    2:20 PM 11/18/2022    2:34 PM 12/16/2022   10:20 AM  Fall Risk  Falls in the past year? 0 0  0 0  Was there an injury with Fall?   0 0 0  Fall Risk Category Calculator    0 0  Fall Risk Category (Retired)    Low Low  (RETIRED) Patient Fall Risk Level  Low fall risk Moderate fall risk Moderate fall risk Moderate fall risk  Patient at Risk for Falls Due to   History of fall(s);Impaired balance/gait;Impaired mobility History of fall(s) History of fall(s)  Fall risk Follow up  Falls evaluation completed Falls evaluation completed;Education provided;Falls prevention discussed Falls evaluation completed Falls evaluation completed   Functional Status Survey:    Vitals:   01/16/23 1338  BP: (!) 108/59  Pulse: 84  Resp: 20  Temp: (!) 97 F (36.1 C)  TempSrc: Temporal  SpO2: 95%  Weight: 171 lb 1.6 oz (77.6 kg)  Height: 5' 4"$  (1.626 m)   Body mass index is 29.37 kg/m. Physical Exam Vitals reviewed.  Constitutional:      Appearance: Normal appearance.  HENT:     Head: Normocephalic.     Nose: Nose normal.     Mouth/Throat:     Mouth: Mucous membranes are moist.     Pharynx: Oropharynx is clear.  Eyes:     Pupils: Pupils are equal, round, and reactive to light.  Cardiovascular:     Rate and Rhythm: Normal rate and regular rhythm.     Pulses: Normal pulses.     Heart sounds: Normal heart sounds. No murmur heard. Pulmonary:     Effort: Pulmonary effort is normal.     Breath sounds: Normal breath sounds.  Abdominal:     General: Abdomen is flat. Bowel sounds are normal.     Palpations: Abdomen is soft.  Musculoskeletal:        General: No  swelling.     Cervical back: Neck supple.     Comments: Right leg Swelling more then left C/o  Pain in her Right Groin and inner thigh area  Skin:    General: Skin is warm.  Neurological:     General: No focal deficit present.     Mental Status: She is alert.  Psychiatric:        Mood and Affect: Mood normal.        Thought Content: Thought content normal.     Labs reviewed: Recent Labs    07/29/22 0000 09/19/22 0000 09/23/22 0000  NA 132* 129* 131*  K 4.1 4.3 4.2  CL 97* 94* 97*  CO2 24* 28* 28*  BUN 13 15 13  $ CREATININE 0.6 0.6 0.7  CALCIUM 9.9 9.6 9.3   Recent Labs    06/03/22 0000 07/26/22 0000  AST 13 15  ALT 9 12  ALKPHOS 67 62  ALBUMIN 3.8 3.9   Recent Labs    06/03/22 0000 07/26/22 0000  WBC 5.8 6.0  NEUTROABS 3,057.00 3,372.00  HGB 12.8 12.8  HCT 38 37  PLT 376 364   Lab Results  Component Value Date   TSH 4.50 07/26/2022   No results found for: "HGBA1C" Lab Results  Component Value Date   CHOL 190 07/26/2020   HDL 56 07/26/2020   LDLCALC 109 (H) 07/26/2020   TRIG 139 07/26/2020   CHOLHDL 3.4 07/26/2020    Significant Diagnostic Results in last 30 days:  No results found.  Assessment/Plan 1. Primary hypertension Doing well on Norvasc and Cozaar  2. Postoperative hypothyroidism TSH  normal in 8/23  3. Late onset Alzheimer's 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 ([place]) Has Failed Aricept and Namenda Now in SNF and Husband wants her to be comfortable Has seen further decline with memory and word findings  4. Right hip pain Will increase her Tylenol to TID Also Schedule Voltaren arthritis gel Continue Therapy 5. Mixed stress and urge urinary incontinence High dose of Ditropan per urology as she failed Pessary Seems to be doing well   6 Mild swelling n right leg Will follow Most likely due to stopping HCTZ due to hyponatremia  Family/ staff Communication: Husband in room  Labs/tests ordered:   Total time spent in this  patient care encounter was  _45  minutes; greater than 50% of the visit spent counseling patient and staff, reviewing records , Labs and coordinating care for problems addressed at this encounter.

## 2023-01-17 ENCOUNTER — Encounter: Payer: Self-pay | Admitting: Internal Medicine

## 2023-01-20 DIAGNOSIS — R2681 Unsteadiness on feet: Secondary | ICD-10-CM | POA: Diagnosis not present

## 2023-01-20 DIAGNOSIS — M6281 Muscle weakness (generalized): Secondary | ICD-10-CM | POA: Diagnosis not present

## 2023-01-23 DIAGNOSIS — R2681 Unsteadiness on feet: Secondary | ICD-10-CM | POA: Diagnosis not present

## 2023-01-23 DIAGNOSIS — M6281 Muscle weakness (generalized): Secondary | ICD-10-CM | POA: Diagnosis not present

## 2023-01-27 DIAGNOSIS — M6281 Muscle weakness (generalized): Secondary | ICD-10-CM | POA: Diagnosis not present

## 2023-01-27 DIAGNOSIS — R2681 Unsteadiness on feet: Secondary | ICD-10-CM | POA: Diagnosis not present

## 2023-01-28 DIAGNOSIS — M6281 Muscle weakness (generalized): Secondary | ICD-10-CM | POA: Diagnosis not present

## 2023-01-28 DIAGNOSIS — R2681 Unsteadiness on feet: Secondary | ICD-10-CM | POA: Diagnosis not present

## 2023-01-30 DIAGNOSIS — M6281 Muscle weakness (generalized): Secondary | ICD-10-CM | POA: Diagnosis not present

## 2023-01-30 DIAGNOSIS — R2681 Unsteadiness on feet: Secondary | ICD-10-CM | POA: Diagnosis not present

## 2023-02-03 DIAGNOSIS — R2681 Unsteadiness on feet: Secondary | ICD-10-CM | POA: Diagnosis not present

## 2023-02-03 DIAGNOSIS — M6281 Muscle weakness (generalized): Secondary | ICD-10-CM | POA: Diagnosis not present

## 2023-02-04 DIAGNOSIS — M6281 Muscle weakness (generalized): Secondary | ICD-10-CM | POA: Diagnosis not present

## 2023-02-04 DIAGNOSIS — R2681 Unsteadiness on feet: Secondary | ICD-10-CM | POA: Diagnosis not present

## 2023-02-06 DIAGNOSIS — M6281 Muscle weakness (generalized): Secondary | ICD-10-CM | POA: Diagnosis not present

## 2023-02-06 DIAGNOSIS — R2681 Unsteadiness on feet: Secondary | ICD-10-CM | POA: Diagnosis not present

## 2023-02-10 ENCOUNTER — Encounter: Payer: Self-pay | Admitting: Orthopedic Surgery

## 2023-02-10 ENCOUNTER — Non-Acute Institutional Stay (SKILLED_NURSING_FACILITY): Payer: PPO | Admitting: Orthopedic Surgery

## 2023-02-10 DIAGNOSIS — G301 Alzheimer's disease with late onset: Secondary | ICD-10-CM | POA: Diagnosis not present

## 2023-02-10 DIAGNOSIS — R2681 Unsteadiness on feet: Secondary | ICD-10-CM

## 2023-02-10 DIAGNOSIS — I10 Essential (primary) hypertension: Secondary | ICD-10-CM

## 2023-02-10 DIAGNOSIS — M25551 Pain in right hip: Secondary | ICD-10-CM

## 2023-02-10 DIAGNOSIS — N3946 Mixed incontinence: Secondary | ICD-10-CM

## 2023-02-10 DIAGNOSIS — R600 Localized edema: Secondary | ICD-10-CM

## 2023-02-10 DIAGNOSIS — E871 Hypo-osmolality and hyponatremia: Secondary | ICD-10-CM

## 2023-02-10 DIAGNOSIS — E890 Postprocedural hypothyroidism: Secondary | ICD-10-CM | POA: Diagnosis not present

## 2023-02-10 DIAGNOSIS — F0280 Dementia in other diseases classified elsewhere without behavioral disturbance: Secondary | ICD-10-CM

## 2023-02-10 DIAGNOSIS — M810 Age-related osteoporosis without current pathological fracture: Secondary | ICD-10-CM | POA: Diagnosis not present

## 2023-02-10 NOTE — Progress Notes (Signed)
Location:   [facility] Room Number: [place] of Service:  SNF [phone]) Provider:  Windell Moulding, NP  PCP: Virgie Dad, MD  Patient Care Team: Virgie Dad, MD as PCP - General (Internal Medicine) Reynold Bowen, MD as Consulting Physician (Endocrinology)  Extended Emergency Contact Information Primary Emergency Contact: Pyon,Gordon Address: [address]          [place], [place] [address] [name] of [place] Phone: [phone] Mobile Phone: [phone] Relation: Spouse Secondary Emergency Contact: [name],[name] Address: [address]          [place], [place] [address] [name] of [place] Phone: [phone] Work Phone: [phone] Mobile Phone: [phone] Relation: Son  Code Status:  DNR Goals of care: Advanced Directive information    02/10/2023    9:27 AM  Advanced Directives  Does Patient Have a Medical Advance Directive? Yes  Type of Paramedic of [place];Living will;Out of facility DNR (pink MOST or yellow form)  Does patient want to make changes to medical advance directive? No - Patient declined  Copy of [place] in Chart? Yes - validated most recent copy scanned in chart (See row information)     Chief Complaint  Patient presents with   Medical Management of Chronic Issues    Routine Visit.    Immunizations    Discuss the need for [employer].    HPI:  Pt is a 89 y.o. female seen today for medical management of chronic diseases.   She currently resides on the skilled nursing unit at [facility]. [place]: HTN, migraines, rectal bleeding, hypothyroidism, s/p thyroidectomy (left) 2017, dementia, osteoporosis, constipation, h/o breast and skin cancer,  abnormal gait and insomnia.    HTN- BUN/creat 13/0.7 09/23/2022, remains on amlodipine and losartan-HCTZ (HCTZ added 02/22)  Dementia- MMSE 23/30 02/2022, BIMS score 10/15 11/2022, no behavioral  outbursts, dependent with ADLs except feeding,  unable to tolerate Namenda/Aricept in past/ rash with Exelon patch, not on medication Unstable gait- no recent falls, uses wheelchair for long distances Right hip pain- past xrays suggest severe arthritis, remains on tylenol, voltaren gel and lidocaine patches Senile osteoporosis- DEXA 09/2021, remains on vitamin D Urinary incontinence- stable with oxybutynin Hypothyroidism- s/p left lobe thyroidectomy 2017, TSH 4.50 07/26/2022, remains on levothyroxine  Hyponatremia- Na + 131 09/23/2022  No recent falls or injuries.   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   Hypertension    Insomnia    Knee pain, bilateral 2014   Lymphedema    Left arm following mastectomy   Memory impairment    Memory loss    Migraine 05/16/2012   Migraine without aura, without mention of intractable migraine without mention of status migrainosus    patient denies at preop of 08/08/16    Other and unspecified hyperlipidemia    Other bursitis disorders    Other infective bursitis, left  shoulder    Pain in joint, ankle and foot    Pain in joint, hand    Pain in joint, lower leg    Pain in joint, shoulder region    Pain in joint, shoulder region 04/23/2016   Pain in limb    Rectal bleeding 08/20/2016   Senile osteoporosis    Symptomatic menopausal or female climacteric states    Syncope and collapse    Unspecified hypertensive heart disease without heart failure 1995   Unspecified hypothyroidism     Unspecified polyarthropathy or polyarthritis, site unspecified    Unspecified urinary incontinence    Unspecified visual loss    Xerophthalmia    Past Surgical History:  Procedure Laterality Date   ABDOMINAL HYSTERECTOMY     BILATERAL OOPHORECTOMY  1981   BREAST RECONSTRUCTION Left 01/2000   BREAST REDUCTION SURGERY Right 01/2000   CATARACT EXTRACTION Right 09/04/2005   COLONOSCOPY  01/2004   MASTECTOMY Left 10/1999   THYROID LOBECTOMY Left 08/14/2016   Procedure: NEAR TOTAL THYROIDECTOMY;  Surgeon: Johnathan Hausen, MD;  Location: [facility];  Service: General;  Laterality: Left;   TOTAL ABDOMINAL HYSTERECTOMY W/ BILATERAL SALPINGOOPHORECTOMY  1981   TOTAL KNEE ARTHROPLASTY Right 2014    Allergies  Allergen Reactions   Donepezil Hcl     Body pain and stiffness    Namenda [Memantine Hcl]      Caused body pain    Sulfa Antibiotics    Sulfamethoxazole Rash    Allergies as of 02/10/2023       Reactions   Donepezil Hcl    Body pain and stiffness   Namenda [memantine Hcl]     Caused body pain    Sulfa Antibiotics    Sulfamethoxazole Rash        Medication List        Accurate as of February 10, 2023  9:27 AM. If you have any questions, ask your nurse or doctor.          acetaminophen 325 MG tablet Commonly known as: TYLENOL Take 325 mg by mouth 3 (three) times daily. Give 2 tablets (total of 650 mg) for pain.   amLODipine 2.5 MG tablet Commonly known as: NORVASC Take 7.5 mg by mouth daily.   Biofreeze 4 % Gel Generic drug: Menthol (Topical Analgesic) Apply 1 Application topically 3 (three) times daily. Apply to lower legs topically as needed for pain.   Cholecalciferol 125 MCG (5000 UT) capsule Take 5,000 Units by mouth daily.   diclofenac Sodium 1 % Gel Commonly known as: VOLTAREN Apply 2 g topically 2 (two) times a week. Apply to right hip/thigh one time daily for pain after shower on Tuesday and Friday.   HM Lidocaine Patch 4 % Generic drug: lidocaine Place 2  patches onto the skin daily. Apply to lower back topically one time a day.   levothyroxine 112 MCG tablet Commonly known as: SYNTHROID Take 112 mcg by mouth daily before breakfast.   losartan-hydrochlorothiazide 100-12.5 MG tablet Commonly known as: HYZAAR Take 1 tablet by mouth daily.   melatonin 5 MG Tabs Take 1 tablet (5 mg total) by mouth at bedtime.   OMEGA-3 CF PO Take 1,000 mg by mouth every morning. With food   oxybutynin 15 MG 24 hr tablet Commonly known as: DITROPAN XL Take 15 mg by mouth at bedtime.   Polyethyl Glycol-Propyl Glycol 0.4-0.3 % Soln Place 2 drops into both eyes daily as needed (dry eyes).   senna 8.6 MG Tabs tablet Commonly known  asDonavan Burnet Take 1 tablet by mouth as needed for mild constipation.   Senna S 8.6-50 MG tablet Generic drug: senna-docusate Take 1 tablet by mouth daily.   Skin Prep Wipes Misc 2 Applications by Does not apply route 2 (two) times daily. Apply to heels topically for skin care treatment.   Therems-M Tabs Take 1 tablet by mouth every morning.   triamcinolone 0.025 % cream Commonly known as: KENALOG Apply 1 application  topically as needed (Apply to affected area topically).   zinc oxide 20 % ointment Apply 1 application  topically as needed for irritation. Apply to buttocks topically after every incontinent episode for redness        Review of Systems  Unable to perform ROS: Dementia    Immunization History  Administered Date(s) Administered   Fluad Quad(high Dose 65+) 10/02/2022   Influenza, High Dose Seasonal PF 09/17/2017, 09/10/2018, 09/22/2019, 09/13/2021   Influenza, Quadrivalent, Recombinant, Inj, Pf 08/29/2020   Influenza,inj,Quad PF,6+ Mos 09/10/2018   Influenza-Unspecified 08/10/2013, 09/07/2015, 09/13/2016   Moderna Covid-19 Vaccine Bivalent Booster 61yr & up 05/22/2022   Moderna SARS-COV2 Booster Vaccination 05/16/2021   Moderna Sars-Covid-2 Vaccination 12/13/2019, 01/10/2020, 10/23/2020    PFIZER(Purple Top)SARS-COV-2 Vaccination 08/29/2021   Pfizer Covid-19 Vaccine Bivalent Booster 146yr& up 10/15/2022   Pneumococcal Conjugate-13 06/08/2017, 11/02/2018   Pneumococcal Polysaccharide-23 12/09/2002, 06/08/2017, 10/22/2017, 11/02/2018   Pneumococcal-Unspecified 06/08/2017, 11/02/2018   Tdap 02/21/2014, 10/29/2017   Zoster Recombinat (Shingrix) 07/18/2017, 01/10/2018   Zoster, Live 07/18/2017, 01/10/2018   Pertinent  Health Maintenance Due  Topic Date Due   INFLUENZA VACCINE  Completed   DEXA SCAN  Completed      03/07/2021    1:03 PM 09/05/2021    8:54 AM 02/18/2022    2:20 PM 11/18/2022    2:34 PM 12/16/2022   10:20 AM  Fall Risk  Falls in the past year? 0 0  0 0  Was there an injury with Fall?   0 0 0  Fall Risk Category Calculator    0 0  Fall Risk Category (Retired)    Low Low  (RETIRED) Patient Fall Risk Level  Low fall risk Moderate fall risk Moderate fall risk Moderate fall risk  Patient at Risk for Falls Due to   History of fall(s);Impaired balance/gait;Impaired mobility History of fall(s) History of fall(s)  Fall risk Follow up  Falls evaluation completed Falls evaluation completed;Education provided;Falls prevention discussed Falls evaluation completed Falls evaluation completed   Functional Status Survey:    Vitals:   02/10/23 0920  BP: (!) 150/82  Pulse: 76  Resp: 20  Temp: 97.8 F (36.6 C)  SpO2: 96%  Weight: 174 lb 1.6 oz (79 kg)  Height: '5\' 4"'$  (1.626 m)   Body mass index is 29.88 kg/m. Physical Exam Vitals reviewed.  Constitutional:      General: She is not in acute distress. HENT:     Head: Normocephalic.     Right Ear: There is no impacted cerumen.     Left Ear: There is no impacted cerumen.     Nose: Nose normal.     Mouth/Throat:     Mouth: Mucous membranes are moist.  Eyes:     General:        Right eye: No discharge.        Left eye: No discharge.  Cardiovascular:     Rate and Rhythm: Normal rate and regular rhythm.      Pulses: Normal pulses.     Heart sounds: Normal  heart sounds.  Pulmonary:     Effort: Pulmonary effort is normal. No respiratory distress.     Breath sounds: Normal breath sounds. No wheezing.  Abdominal:     General: Bowel sounds are normal. There is no distension.     Palpations: Abdomen is soft.     Tenderness: There is no abdominal tenderness.  Musculoskeletal:     Cervical back: Neck supple.     Right lower leg: Edema present.     Left lower leg: Edema present.     Comments: +1 pitting  Skin:    General: Skin is warm and dry.     Capillary Refill: Capillary refill takes less than 2 seconds.  Neurological:     General: No focal deficit present.     Mental Status: She is alert. Mental status is at baseline.     Motor: Weakness present.     Gait: Gait abnormal.  Psychiatric:        Mood and Affect: Mood normal.        Behavior: Behavior normal.     Labs reviewed: Recent Labs    07/29/22 0000 09/19/22 0000 09/23/22 0000  NA 132* 129* 131*  K 4.1 4.3 4.2  CL 97* 94* 97*  CO2 24* 28* 28*  BUN '13 15 13  '$ CREATININE 0.6 0.6 0.7  CALCIUM 9.9 9.6 9.3   Recent Labs    06/03/22 0000 07/26/22 0000  AST 13 15  ALT 9 12  ALKPHOS 67 62  ALBUMIN 3.8 3.9   Recent Labs    06/03/22 0000 07/26/22 0000  WBC 5.8 6.0  NEUTROABS 3,057.00 3,372.00  HGB 12.8 12.8  HCT 38 37  PLT 376 364   Lab Results  Component Value Date   TSH 4.50 07/26/2022   No results found for: "HGBA1C" Lab Results  Component Value Date   CHOL 190 07/26/2020   HDL 56 07/26/2020   LDLCALC 109 (H) 07/26/2020   TRIG 139 07/26/2020   CHOLHDL 3.4 07/26/2020    Significant Diagnostic Results in last 30 days:  No results found.  Assessment/Plan 1. Primary hypertension - controlled - appears HCTZ added to losartan 01/30/2023 - recheck bmp due to h/o hyponatremia - cont amlodipine  2. Bilateral lower extremity edema - see above - 1+ pitting edema present - cone leg elevation  3. Late  onset Alzheimer's 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 ([place]) - no behaviors - dependent with ADLs except feeding - no on medication  4. Unstable gait - no recent falls or injuries - cont skilled nursing  5. Right hip pain - ongoing - past imaging indicated severe arthritis - cont tylenol, voltaren gel and lidocaine patches  6. Senile osteoporosis - DEXA 2022 - cont calcium/vitamin D   7. Mixed stress and urge urinary incontinence - stable with oxybutynin  8. Postoperative hypothyroidism - TSH stable - cont levothyroxine  9. Hyponatremia - restarted on losartan-HCTZ - bmp 03/07     Family/ staff Communication: plan discussed with patient and nurse  Labs/tests ordered:  bmp 03/07

## 2023-02-13 DIAGNOSIS — I10 Essential (primary) hypertension: Secondary | ICD-10-CM | POA: Diagnosis not present

## 2023-02-13 LAB — BASIC METABOLIC PANEL
BUN: 18 (ref 4–21)
CO2: 28 — AB (ref 13–22)
Chloride: 94 — AB (ref 99–108)
Creatinine: 0.7 (ref 0.5–1.1)
Glucose: 85
Potassium: 4.3 mEq/L (ref 3.5–5.1)
Sodium: 129 — AB (ref 137–147)

## 2023-02-13 LAB — COMPREHENSIVE METABOLIC PANEL: Calcium: 9.6 (ref 8.7–10.7)

## 2023-02-20 DIAGNOSIS — I10 Essential (primary) hypertension: Secondary | ICD-10-CM | POA: Diagnosis not present

## 2023-02-20 LAB — COMPREHENSIVE METABOLIC PANEL: Calcium: 9.2 (ref 8.7–10.7)

## 2023-02-20 LAB — BASIC METABOLIC PANEL
BUN: 18 (ref 4–21)
CO2: 26 — AB (ref 13–22)
Chloride: 98 — AB (ref 99–108)
Creatinine: 0.7 (ref 0.5–1.1)
Glucose: 88
Potassium: 4.5 mEq/L (ref 3.5–5.1)
Sodium: 131 — AB (ref 137–147)

## 2023-02-21 ENCOUNTER — Encounter: Payer: Self-pay | Admitting: Orthopedic Surgery

## 2023-02-21 ENCOUNTER — Non-Acute Institutional Stay (INDEPENDENT_AMBULATORY_CARE_PROVIDER_SITE_OTHER): Payer: PPO | Admitting: Orthopedic Surgery

## 2023-02-21 DIAGNOSIS — Z0000 Encounter for general adult medical examination without abnormal findings: Secondary | ICD-10-CM | POA: Diagnosis not present

## 2023-02-21 NOTE — Progress Notes (Signed)
Subjective:   Frances Holland is a 89 y.o. female who presents for Medicare Annual (Subsequent) preventive examination.  Place of Service: [facility]- skilled nursing unit Provider: Windell Moulding, AGNP-C   Review of Systems     Cardiac Risk Factors include: advanced age (>78men, >52 women);hypertension;sedentary lifestyle     Objective:    Today's Vitals   02/21/23 1435  BP: (!) 172/93  Pulse: 72  Resp: 17  Temp: (!) 97.3 F (36.3 C)  SpO2: 92%  Weight: 174 lb 1.6 oz (79 kg)   Body mass index is 29.88 kg/m.     02/10/2023    9:27 AM 01/16/2023    1:43 PM 12/16/2022   10:20 AM 11/18/2022    2:37 PM 10/24/2022   12:13 PM 10/22/2022   10:33 AM 10/17/2022    3:53 PM  Advanced Directives  Does Patient Have a Medical Advance Directive? Yes Yes Yes Yes Yes Yes Yes  Type of Paramedic of [place];Living will;Out of facility DNR (pink MOST or yellow form) Living will;Out of facility DNR (pink MOST or yellow form) Living will;Out of facility DNR (pink MOST or yellow form) Living will;Out of facility DNR (pink MOST or yellow form) Living will;Out of facility DNR (pink MOST or yellow form) [place];Living will;Out of facility DNR (pink MOST or yellow form) Living will;Out of facility DNR (pink MOST or yellow form)  Does patient want to make changes to medical advance directive? No - Patient declined No - Patient declined No - Patient declined No - Patient declined No - Patient declined No - Patient declined No - Patient declined  Copy of [place] in Chart? Yes - validated most recent copy scanned in chart (See row information) Yes - validated most recent copy scanned in chart (See row information) Yes - validated most recent copy scanned in chart (See row information) Yes - validated most recent copy scanned in chart (See row information) Yes - validated most recent copy scanned in chart (See row information) Yes - validated  most recent copy scanned in chart (See row information)   Pre-existing out of facility DNR order (yellow form or pink MOST form)  Pink MOST form placed in chart (order not valid for inpatient use);Yellow form placed in chart (order not valid for inpatient use) Pink MOST form placed in chart (order not valid for inpatient use);Yellow form placed in chart (order not valid for inpatient use) Pink MOST form placed in chart (order not valid for inpatient use);Yellow form placed in chart (order not valid for inpatient use)  Pink Most/Yellow Form available - Physician notified to receive inpatient order;Yellow form placed in chart (order not valid for inpatient use)     Current Medications (verified) Outpatient Encounter Medications as of 02/21/2023  Medication Sig   acetaminophen (TYLENOL) 325 MG tablet Take 325 mg by mouth 3 (three) times daily. Give 2 tablets (total of 650 mg) for pain.   amLODipine (NORVASC) 2.5 MG tablet Take 7.5 mg by mouth daily.   Cholecalciferol 5000 units capsule Take 5,000 Units by mouth daily.   diclofenac Sodium (VOLTAREN) 1 % GEL Apply 2 g topically 2 (two) times a week. Apply to right hip/thigh one time daily for pain after shower on Tuesday and Friday.   levothyroxine (SYNTHROID) 112 MCG tablet Take 112 mcg by mouth daily before breakfast.   lidocaine (HM LIDOCAINE PATCH) 4 % Place 2 patches onto the skin daily. Apply to lower  back topically one time a day.   losartan-hydrochlorothiazide (HYZAAR) 100-12.5 MG tablet Take 1 tablet by mouth daily.   melatonin 5 MG TABS Take 1 tablet (5 mg total) by mouth at bedtime.   Menthol, Topical Analgesic, (BIOFREEZE) 4 % GEL Apply 1 Application topically 3 (three) times daily. Apply to lower legs topically as needed for pain.   Multiple Vitamins-Minerals (THEREMS-M) TABS Take 1 tablet by mouth every morning.   Omega-3 Fatty Acids (OMEGA-3 CF PO) Take 1,000 mg by mouth every morning. With food   Ostomy Supplies (SKIN PREP WIPES) MISC 2  Applications by Does not apply route 2 (two) times daily. Apply to heels topically for skin care treatment.   oxybutynin (DITROPAN XL) 15 MG 24 hr tablet Take 15 mg by mouth at bedtime.   Polyethyl Glycol-Propyl Glycol 0.4-0.3 % SOLN Place 2 drops into both eyes daily as needed (dry eyes).    senna (SENOKOT) 8.6 MG TABS tablet Take 1 tablet by mouth as needed for mild constipation.   senna-docusate (SENNA S) 8.6-50 MG tablet Take 1 tablet by mouth daily.   triamcinolone (KENALOG) 0.025 % cream Apply 1 application  topically as needed (Apply to affected area topically).   zinc oxide 20 % ointment Apply 1 application  topically as needed for irritation. Apply to buttocks topically after every incontinent episode for redness   No facility-administered encounter medications on file as of 02/21/2023.    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   Hypertension    Insomnia    Knee pain, bilateral 2014   Lymphedema    Left arm following mastectomy   Memory impairment    Memory loss    Migraine 05/16/2012   Migraine without aura, without mention of intractable migraine without mention of status migrainosus    patient denies at preop of 08/08/16    Other and unspecified hyperlipidemia    Other bursitis disorders    Other infective bursitis, left shoulder    Pain in joint, ankle and foot    Pain in joint, hand    Pain in joint, lower leg    Pain in joint, shoulder region    Pain in joint, shoulder region 04/23/2016   Pain in limb    Rectal bleeding 08/20/2016   Senile  osteoporosis    Symptomatic menopausal or female climacteric states    Syncope and collapse    Unspecified hypertensive heart disease without heart failure 1995   Unspecified hypothyroidism    Unspecified polyarthropathy or polyarthritis, site unspecified    Unspecified urinary incontinence    Unspecified visual loss    Xerophthalmia    Past Surgical History:  Procedure Laterality Date   ABDOMINAL HYSTERECTOMY     BILATERAL OOPHORECTOMY  1981   BREAST RECONSTRUCTION Left 01/2000   BREAST REDUCTION SURGERY Right 01/2000   CATARACT EXTRACTION Right 09/04/2005   COLONOSCOPY  01/2004   MASTECTOMY Left 10/1999   THYROID LOBECTOMY Left 08/14/2016   Procedure: NEAR TOTAL THYROIDECTOMY;  Surgeon: Johnathan Hausen, MD;  Location:  [facility];  Service: General;  Laterality: Left;   TOTAL ABDOMINAL HYSTERECTOMY W/ BILATERAL SALPINGOOPHORECTOMY  1981   TOTAL KNEE ARTHROPLASTY Right 2014   Family History  Problem Relation Age of Onset   Heart disease Mother    Dementia Mother    Dementia Father    Cancer Daughter    Asthma Son    Social History   Socioeconomic History   Marital status: Married    Spouse name: Not on file   Number of children: 3   Years of education: 14   Highest education level: Not on file  Occupational History   Occupation: retired Estate agent   Tobacco Use   Smoking status: Never   Smokeless tobacco: Never  Vaping Use   Vaping Use: Never used  Substance and Sexual Activity   Alcohol use: No   Drug use: No   Sexual activity: Never    Comment: 1st intercourse 89 yo-Fewer than 5 partners  Other Topics Concern   Not on file  Social History Narrative   Lives at [facility] since 07/07/2014   Married -[name]   Never smoked   Right handed     Alcohol none   Caffeine 2 cups of coffee daily   Exercise walks daily, exercise class half hour 2-3 times a week   Living will,  POA   Patient believes that her memory is OK except for recall of names. Her husband is  concerned about it. She scored 29/30 on MMSE in Dec 2017. They both have concerns about the future if he precedes her in death, but he has written down how he keeps accounts and has scheduled an appt with an accountant to do their taxes. They are a 2nd marriage and have his and her families although the "children" get along. They have been married 30 years.   Social Determinants of Health   Financial Resource Strain: Low Risk  (02/21/2023)   Overall Financial Resource Strain (CARDIA)    Difficulty of Paying Living Expenses: Not hard at all  Food Insecurity: No Food Insecurity (02/21/2023)   Hunger Vital Sign    Worried About Running Out of Food in the Last Year: Never true    Ran Out of Food in the Last Year: Never true  Transportation Needs: No Transportation Needs (02/21/2023)   PRAPARE - Hydrologist (Medical): No    Lack of Transportation (Non-Medical): No  Physical Activity: Insufficiently Active (02/21/2023)   Exercise Vital Sign    Days of Exercise per Week: 7 days    Minutes of Exercise per Session: 10 min  Stress: No Stress Concern Present (02/21/2023)   [place]    Feeling of Stress : Not at all  Social Connections: Moderately Integrated (02/21/2023)   Social Connection and Isolation Panel [NHANES]    Frequency of Communication with Friends and Family: Twice a week    Frequency of Social Gatherings with Friends and Family: More than three times a week    Attends Religious Services: More than 4 times per year    Active Member of [employer] or Organizations: No    Attends Archivist Meetings: Never    Marital Status: Married    Tobacco Counseling Counseling given: Not Answered   Clinical Intake:  Pre-visit preparation completed: No  Pain : No/denies pain     BMI - recorded: 29.88 Nutritional Status: BMI 25 -29 Overweight Nutritional Risks: None Diabetes:  No  How  often do you need to have someone help you when you read instructions, pamphlets, or other written materials from your doctor or pharmacy?: 5 - Always What is the last grade level you completed in school?: 2 years college  Diabetic?No  Interpreter Needed?: No      Activities of Daily Living    02/21/2023    2:43 PM  In your present state of health, do you have any difficulty performing the following activities:  Hearing? 1  Vision? 0  Difficulty concentrating or making decisions? 1  Walking or climbing stairs? 1  Dressing or bathing? 1  Doing errands, shopping? 1  Preparing Food and eating ? Y  Using the Toilet? Y  In the past six months, have you accidently leaked urine? Y  Do you have problems with loss of bowel control? Y  Managing your Medications? Y  Managing your Finances? Y  Housekeeping or managing your Housekeeping? Y    Patient Care Team: Virgie Dad, MD as PCP - General (Internal Medicine) Reynold Bowen, MD as Consulting Physician (Endocrinology)  Indicate any recent Medical Services you may have received from other than [facility] providers in the past year (date may be approximate).     Assessment:   This is a routine wellness examination for [place].  Hearing/Vision screen No results found.  Dietary issues and exercise activities discussed: Current Exercise Habits: The patient does not participate in regular exercise at present, Exercise limited by: cardiac condition(s);neurologic condition(s);orthopedic condition(s)   Goals Addressed             This Visit's Progress    Increase physical activity   Not on track    Maintain LIfestyle   Not on track    Starting today pt will maintain lifestyle.      Maintain Mobility and Function   On track    Evidence-based guidance:  Acknowledge and validate impact of pain, loss of strength and potential disfigurement (hand osteoarthritis) on mental health and daily life, such as social isolation, anxiety,  depression, impaired sexual relationship and   injury from falls.  Anticipate referral to physical or occupational therapy for assessment, therapeutic exercise and recommendation for adaptive equipment or assistive devices; encourage participation.  Assess impact on ability to perform activities of daily living, as well as engage in sports and leisure events or requirements of work or school.  Provide anticipatory guidance and reassurance about the benefit of exercise to maintain function; acknowledge and normalize fear that exercise may worsen symptoms.  Encourage regular exercise, at least 10 minutes at a time for 45 minutes per week; consider yoga, water exercise and proprioceptive exercises; encourage use of wearable activity tracker to increase motivation and adherence.  Encourage maintenance or resumption of daily activities, including employment, as pain allows and with minimal exposure to trauma.  Assist patient to advocate for adaptations to the work environment.  Consider level of pain and function, gender, age, lifestyle, patient preference, quality of life, readiness and ?ocapacity to benefit? when recommending patients for orthopaedic surgery consultation.  Explore strategies, such as changes to medication regimen or activity that enables patient to anticipate and manage flare-ups that increase deconditioning and disability.  Explore patient preferences; encourage exposure to a broader range of activities that have been avoided for fear of experiencing pain.  Identify barriers to participation in therapy or exercise, such as pain with activity, anticipated or imagined pain.  Monitor postoperative joint replacement or any preexisting joint replacement for ongoing pain  and loss of function; provide social support and encouragement throughout recovery.   Notes:        Depression Screen    02/21/2023    2:42 PM 12/16/2022   10:20 AM 11/18/2022    2:34 PM 02/18/2022    2:17 PM 04/11/2020     4:21 PM 10/30/2018   10:05 AM 07/01/2018   11:03 AM  PHQ 2/9 Scores  PHQ - 2 Score 0 0 0 0 0 0 0  PHQ- 9 Score     5  6    Fall Risk    02/21/2023    2:43 PM 12/16/2022   10:20 AM 11/18/2022    2:34 PM 02/18/2022    2:20 PM 09/05/2021    8:54 AM  Fall Risk   Falls in the past year? 1 0 0  0  Number falls in past yr: 1 0 0 0 0  Injury with Fall? 0 0 0 0   Risk for fall due to : History of fall(s);Impaired balance/gait History of fall(s) History of fall(s) History of fall(s);Impaired balance/gait;Impaired mobility   Risk for fall due to: Comment H/o dementia      Follow up Falls evaluation completed;Education provided;Falls prevention discussed Falls evaluation completed Falls evaluation completed Falls evaluation completed;Education provided;Falls prevention discussed Falls evaluation completed    FALL RISK PREVENTION PERTAINING TO THE HOME:  Any stairs in or around the home? No  If so, are there any without handrails? Yes  Home free of loose throw rugs in walkways, pet beds, electrical cords, etc? Yes  Adequate lighting in your home to reduce risk of falls? Yes   ASSISTIVE DEVICES UTILIZED TO PREVENT FALLS:  Life alert? No  Use of a cane, walker or w/c? Yes  Grab bars in the bathroom? Yes  Shower chair or bench in shower? Yes  Elevated toilet seat or a handicapped toilet? Yes   TIMED UP AND GO:  Was the test performed? No .  Length of time to ambulate 10 feet: N/A sec.   Gait slow and steady with assistive device  Cognitive Function:    02/18/2022    2:21 PM 09/05/2021    1:49 PM 04/11/2020    4:29 PM 04/04/2020   11:27 AM 10/04/2019   11:27 AM  MMSE - Mini Mental State Exam  Not completed: Unable to complete      Orientation to time  3 3 5 5   Orientation to Place  4 4 3 2   Registration  3 3 3 3   Attention/ Calculation  1 3 4 4   Recall  3 2 0 1  Language- name 2 objects  2 2 2 2   Language- repeat  1 1 1 1   Language- follow 3 step command  3 3 3 3   Language-  read & follow direction  1 1 1 1   Write a sentence  1 1 1 1   Copy design  1 1 1 1   Total score  23 24 24 24       11/14/2021    2:08 PM  Montreal Cognitive Assessment   Visuospatial/ Executive (0/5) 2  Naming (0/3) 2  Attention: Read list of digits (0/2) 1  Attention: Read list of letters (0/1) 0  Attention: Serial 7 subtraction starting at 100 (0/3) 0  Language: Repeat phrase (0/2) 0  Language : Fluency (0/1) 0  Abstraction (0/2) 2  Delayed Recall (0/5) 0  Orientation (0/6) 4  Total 11  Adjusted Score (based on education) 11  Immunizations Immunization History  Administered Date(s) Administered   Fluad Quad(high Dose 65+) 10/02/2022   Influenza, High Dose Seasonal PF 09/17/2017, 09/10/2018, 09/22/2019, 09/13/2021   Influenza, Quadrivalent, Recombinant, Inj, Pf 08/29/2020   Influenza,inj,Quad PF,6+ Mos 09/10/2018   Influenza-Unspecified 08/10/2013, 09/07/2015, 09/13/2016   Moderna Covid-19 Vaccine Bivalent Booster 77yrs & up 05/22/2022   Moderna SARS-COV2 Booster Vaccination 05/16/2021   Moderna Sars-Covid-2 Vaccination 12/13/2019, 01/10/2020, 10/23/2020   PFIZER(Purple Top)SARS-COV-2 Vaccination 08/29/2021   Pfizer Covid-19 Vaccine Bivalent Booster 18yrs & up 10/15/2022   Pneumococcal Conjugate-13 06/08/2017, 11/02/2018   Pneumococcal Polysaccharide-23 12/09/2002, 06/08/2017, 10/22/2017, 11/02/2018   Pneumococcal-Unspecified 06/08/2017, 11/02/2018   Tdap 02/21/2014, 10/29/2017   Zoster Recombinat (Shingrix) 07/18/2017, 01/10/2018   Zoster, Live 07/18/2017, 01/10/2018    TDAP status: Up to date  Flu Vaccine status: Up to date  Pneumococcal vaccine status: Up to date  Covid-19 vaccine status: Completed vaccines  Qualifies for Shingles Vaccine? Yes   Zostavax completed Yes   Shingrix Completed?: Yes  Screening Tests Health Maintenance  Topic Date Due   COVID-19 Vaccine (7 - 2023-24 season) 08/08/2023 (Originally 12/10/2022)   Medicare Annual Wellness (AWV)   02/21/2024   DTaP/Tdap/Td (3 - Td or Tdap) 10/30/2027   Pneumonia Vaccine 67+ Years old  Completed   INFLUENZA VACCINE  Completed   DEXA SCAN  Completed   Zoster Vaccines- Shingrix  Completed   HPV VACCINES  Aged Out    Health Maintenance  There are no preventive care reminders to display for this patient.   Colorectal cancer screening: No longer required.   Mammogram status: No longer required due to advanced age.  Bone Density status: Completed 2022. Results reflect: Bone density results: OSTEOPENIA. Repeat every 2 years.  Lung Cancer Screening: (Low Dose CT Chest recommended if Age 63-80 years, 30 pack-year currently smoking OR have quit w/in 15years.) does not qualify.   Lung Cancer Screening Referral: No  Additional Screening:  Hepatitis C Screening: does not qualify; Completed   Vision Screening: Recommended annual ophthalmology exams for early detection of glaucoma and other disorders of the eye. Is the patient up to date with their annual eye exam?  Yes  Who is the provider or what is the name of the office in which the patient attends annual eye exams? unknown If pt is not established with a provider, would they like to be referred to a provider to establish care? No .   Dental Screening: Recommended annual dental exams for proper oral hygiene  Community Resource Referral / Chronic Care Management: CRR required this visit?  No   CCM required this visit?  No      Plan:     I have personally reviewed and noted the following in the patient's chart:   Medical and social history Use of alcohol, tobacco or illicit drugs  Current medications and supplements including opioid prescriptions. Patient is not currently taking opioid prescriptions. Functional ability and status Nutritional status Physical activity Advanced directives List of other physicians Hospitalizations, surgeries, and ER visits in previous 12 months Vitals Screenings to include cognitive,  depression, and falls Referrals and appointments  In addition, I have reviewed and discussed with patient certain preventive protocols, quality metrics, and best practice recommendations. A written personalized care plan for preventive services as well as general preventive health recommendations were provided to patient.     Yvonna Alanis, NP   02/21/2023   Nurse Notes:

## 2023-02-21 NOTE — Patient Instructions (Signed)
  Frances Holland , Thank you for taking time to come for your Medicare Wellness Visit. I appreciate your ongoing commitment to your health goals. Please review the following plan we discussed and let me know if I can assist you in the future.   These are the goals we discussed:  Goals      Increase physical activity     Maintain LIfestyle     Starting today pt will maintain lifestyle.      Maintain Mobility and Function     Evidence-based guidance:  Acknowledge and validate impact of pain, loss of strength and potential disfigurement (hand osteoarthritis) on mental health and daily life, such as social isolation, anxiety, depression, impaired sexual relationship and   injury from falls.  Anticipate referral to physical or occupational therapy for assessment, therapeutic exercise and recommendation for adaptive equipment or assistive devices; encourage participation.  Assess impact on ability to perform activities of daily living, as well as engage in sports and leisure events or requirements of work or school.  Provide anticipatory guidance and reassurance about the benefit of exercise to maintain function; acknowledge and normalize fear that exercise may worsen symptoms.  Encourage regular exercise, at least 10 minutes at a time for 45 minutes per week; consider yoga, water exercise and proprioceptive exercises; encourage use of wearable activity tracker to increase motivation and adherence.  Encourage maintenance or resumption of daily activities, including employment, as pain allows and with minimal exposure to trauma.  Assist patient to advocate for adaptations to the work environment.  Consider level of pain and function, gender, age, lifestyle, patient preference, quality of life, readiness and ?ocapacity to benefit? when recommending patients for orthopaedic surgery consultation.  Explore strategies, such as changes to medication regimen or activity that enables patient to anticipate and  manage flare-ups that increase deconditioning and disability.  Explore patient preferences; encourage exposure to a broader range of activities that have been avoided for fear of experiencing pain.  Identify barriers to participation in therapy or exercise, such as pain with activity, anticipated or imagined pain.  Monitor postoperative joint replacement or any preexisting joint replacement for ongoing pain and loss of function; provide social support and encouragement throughout recovery.   Notes:         This is a list of the screening recommended for you and due dates:  Health Maintenance  Topic Date Due   COVID-19 Vaccine (7 - 2023-24 season) 08/08/2023*   Medicare Annual Wellness Visit  02/21/2024   DTaP/Tdap/Td vaccine (3 - Td or Tdap) 10/30/2027   Pneumonia Vaccine  Completed   Flu Shot  Completed   DEXA scan (bone density measurement)  Completed   Zoster (Shingles) Vaccine  Completed   HPV Vaccine  Aged Out  *Topic was postponed. The date shown is not the original due date.

## 2023-03-12 ENCOUNTER — Non-Acute Institutional Stay (SKILLED_NURSING_FACILITY): Payer: PPO | Admitting: Orthopedic Surgery

## 2023-03-12 ENCOUNTER — Encounter: Payer: Self-pay | Admitting: Orthopedic Surgery

## 2023-03-12 DIAGNOSIS — F0280 Dementia in other diseases classified elsewhere without behavioral disturbance: Secondary | ICD-10-CM | POA: Diagnosis not present

## 2023-03-12 DIAGNOSIS — G301 Alzheimer's disease with late onset: Secondary | ICD-10-CM

## 2023-03-12 DIAGNOSIS — N3946 Mixed incontinence: Secondary | ICD-10-CM

## 2023-03-12 DIAGNOSIS — E890 Postprocedural hypothyroidism: Secondary | ICD-10-CM | POA: Diagnosis not present

## 2023-03-12 DIAGNOSIS — M810 Age-related osteoporosis without current pathological fracture: Secondary | ICD-10-CM

## 2023-03-12 DIAGNOSIS — M25551 Pain in right hip: Secondary | ICD-10-CM

## 2023-03-12 DIAGNOSIS — I10 Essential (primary) hypertension: Secondary | ICD-10-CM | POA: Diagnosis not present

## 2023-03-12 DIAGNOSIS — R2681 Unsteadiness on feet: Secondary | ICD-10-CM | POA: Diagnosis not present

## 2023-03-12 DIAGNOSIS — E871 Hypo-osmolality and hyponatremia: Secondary | ICD-10-CM

## 2023-03-12 NOTE — Progress Notes (Signed)
Location:   [place] Room Number: [place] of Service:  SNF [phone]) Provider:  Windell Moulding, NP  PCP: Virgie Dad, MD  Patient Care Team: Virgie Dad, MD as PCP - General (Internal Medicine) Reynold Bowen, MD as Consulting Physician (Endocrinology)  Extended Emergency Contact Information Primary Emergency Contact: Amador,Gordon Address: [address]          [place], [place] [address] [name] of [place] Phone: [phone] Mobile Phone: [phone] Relation: Spouse Secondary Emergency Contact: [name],[name] Address: [address]          [place], [place] [address] [name] of [place] Phone: [phone] Work Phone: [phone] Mobile Phone: [phone] Relation: Son  Code Status:  DNR Goals of care: Advanced Directive information    03/12/2023   10:02 AM  Advanced Directives  Does Patient Have a Medical Advance Directive? Yes  Type of Paramedic of [place];Living will;Out of facility DNR (pink MOST or yellow form)  Does patient want to make changes to medical advance directive? No - Patient declined  Copy of [place] in Chart? Yes - validated most recent copy scanned in chart (See row information)     Chief Complaint  Patient presents with   Medical Management of Chronic Issues    Routine Visit.    HPI:  Pt is a 89 y.o. female seen today for medical management of chronic diseases.    She currently resides on the skilled nursing unit at [facility]. [place]: HTN, migraines, rectal bleeding, hypothyroidism, s/p thyroidectomy (left) 2017, dementia, osteoporosis, constipation, h/o breast and skin cancer, abnormal gait and insomnia.    HTN- BUN/creat 18/0.7 02/20/2023, amlodipine and HCTZ discontinued last month, now on losartan 100 mg daily, see trends below Dementia- MMSE 23/30 02/2022, BIMS score 10/15 (02/2023)> was 10/15 11/2022, no behavioral outbursts,  dependent with ADLs except feeding,  unable to tolerate Namenda/Aricept in past/ rash with Exelon patch, not on medication Unstable gait- no recent falls, uses wheelchair for long distances Right hip pain- past xrays suggest severe arthritis, remains on tylenol, voltaren gel and lidocaine patches Senile osteoporosis- DEXA 09/2021, remains on vitamin D Urinary incontinence- stable with oxybutynin Hypothyroidism- s/p left lobe thyroidectomy 2017, TSH 4.50 07/26/2022, remains on levothyroxine  Hyponatremia- Na + 131 02/20/2023  Recent blood pressures:  04/02- 180/99  03/26- 108/72  03/19- 145/88  03/12- 172/93  Recent weights:  04/01- 175.9 lbs  03/01- 174.5 lbs  02/01- 171.1 lbs    Past Surgical History:  Procedure Laterality Date   ABDOMINAL HYSTERECTOMY     BILATERAL OOPHORECTOMY  1981   BREAST RECONSTRUCTION Left 01/2000   BREAST REDUCTION SURGERY Right 01/2000   CATARACT EXTRACTION Right 09/04/2005   COLONOSCOPY  01/2004   MASTECTOMY Left 10/1999   THYROID LOBECTOMY Left 08/14/2016   Procedure: NEAR TOTAL THYROIDECTOMY;  Surgeon: Johnathan Hausen, MD;  Location: [facility];  Service: General;  Laterality: Left;   TOTAL ABDOMINAL HYSTERECTOMY W/ BILATERAL SALPINGOOPHORECTOMY  1981   TOTAL KNEE ARTHROPLASTY Right 2014    Allergies  Allergen Reactions   Donepezil Hcl     Body pain and stiffness    Namenda [Memantine Hcl]      Caused body pain    Sulfa Antibiotics    Sulfamethoxazole Rash    Allergies as of 03/12/2023       Reactions   Donepezil Hcl    Body pain and stiffness   Namenda [memantine Hcl]  Caused body pain    Sulfa Antibiotics    Sulfamethoxazole Rash        Medication List        Accurate as of March 12, 2023 10:48 AM. If you have any questions, ask your nurse or doctor.          acetaminophen 325 MG tablet Commonly known as: TYLENOL Take 325 mg by mouth 3 (three) times daily. Give 2 tablets (total of 650 mg) for pain.   amLODipine 5 MG  tablet Commonly known as: NORVASC Take 5 mg by mouth daily. What changed: Another medication with the same name was removed. Continue taking this medication, and follow the directions you see here. Changed by: Yvonna Alanis, NP   Biofreeze 4 % Gel Generic drug: Menthol (Topical Analgesic) Apply 1 Application topically as needed. Apply to lower legs topically as needed for pain.   Cholecalciferol 125 MCG (5000 UT) capsule Take 5,000 Units by mouth daily.   diclofenac Sodium 1 % Gel Commonly known as: VOLTAREN Apply 2 g topically 2 (two) times a week. Apply to right hip/thigh one time daily for pain after shower on Tuesday and Friday.   HM Lidocaine Patch 4 % Generic drug: lidocaine Place 2 patches onto the skin daily. Apply to lower back topically one time a day.   levothyroxine 112 MCG tablet Commonly known as: SYNTHROID Take 112 mcg by mouth daily before breakfast.   losartan 100 MG tablet Commonly known as: COZAAR Take 100 mg by mouth daily.   melatonin 5 MG Tabs Take 1 tablet (5 mg total) by mouth at bedtime.   OMEGA-3 CF PO Take 1,000 mg by mouth every morning. With food   oxybutynin 15 MG 24 hr tablet Commonly known as: DITROPAN XL Take 15 mg by mouth at bedtime.   Polyethyl Glycol-Propyl Glycol 0.4-0.3 % Soln Place 2 drops into both eyes daily as needed (dry eyes).   senna 8.6 MG Tabs tablet Commonly known as: SENOKOT Take 1 tablet by mouth as needed for mild constipation.   Senna S 8.6-50 MG tablet Generic drug: senna-docusate Take 1 tablet by mouth daily.   Skin Prep Wipes Misc 2 Applications by Does not apply route 2 (two) times daily. Apply to heels topically for skin care treatment.   Therems-M Tabs Take 1 tablet by mouth every morning.   triamcinolone 0.025 % cream Commonly known as: KENALOG Apply 1 application  topically as needed (Apply to affected area topically).   zinc oxide 20 % ointment Apply 1 application  topically as needed for  irritation. Apply to buttocks topically after every incontinent episode for redness        Review of Systems  Unable to perform ROS: Dementia    Immunization History  Administered Date(s) Administered   Fluad Quad(high Dose 65+) 10/02/2022   Influenza, High Dose Seasonal PF 09/17/2017, 09/10/2018, 09/22/2019, 09/13/2021   Influenza, Quadrivalent, Recombinant, Inj, Pf 08/29/2020   Influenza,inj,Quad PF,6+ Mos 09/10/2018   Influenza-Unspecified 08/10/2013, 09/07/2015, 09/13/2016   Moderna Covid-19 Vaccine Bivalent Booster 38yrs & up 05/22/2022   Moderna SARS-COV2 Booster Vaccination 05/16/2021   Moderna Sars-Covid-2 Vaccination 12/13/2019, 01/10/2020, 10/23/2020   PFIZER(Purple Top)SARS-COV-2 Vaccination 08/29/2021   Pfizer Covid-19 Vaccine Bivalent Booster 56yrs & up 10/15/2022   Pneumococcal Conjugate-13 06/08/2017, 11/02/2018   Pneumococcal Polysaccharide-23 12/09/2002, 06/08/2017, 10/22/2017, 11/02/2018   Pneumococcal-Unspecified 06/08/2017, 11/02/2018   Tdap 02/21/2014, 10/29/2017   Zoster Recombinat (Shingrix) 07/18/2017, 01/10/2018   Zoster, Live 07/18/2017, 01/10/2018   Pertinent  Health  Maintenance Due  Topic Date Due   INFLUENZA VACCINE  07/10/2023   DEXA SCAN  Completed      09/05/2021    8:54 AM 02/18/2022    2:20 PM 11/18/2022    2:34 PM 12/16/2022   10:20 AM 02/21/2023    2:43 PM  [place] in the past year? 0  0 0 1  Was there an injury with Fall?  0 0 0 0  Fall Risk Category Calculator   0 0 2  Fall Risk Category (Retired)   Low Low   (RETIRED) Patient Fall Risk Level Low fall risk Moderate fall risk Moderate fall risk Moderate fall risk   Patient at Risk for Falls Due to  History of fall(s);Impaired balance/gait;Impaired mobility History of fall(s) History of fall(s) History of fall(s);Impaired balance/gait  Patient at Risk for Falls Due to - Comments     H/o dementia  Fall risk Follow up Falls evaluation completed Falls evaluation completed;Education  provided;Falls prevention discussed Falls evaluation completed Falls evaluation completed Falls evaluation completed;Education provided;Falls prevention discussed   Functional Status Survey:    Vitals:   03/12/23 0957  BP: (!) 180/99  Pulse: 81  Resp: 18  Temp: 98.2 F (36.8 C)  SpO2: 98%  Weight: 175 lb 14.4 oz (79.8 kg)  Height: 5\' 4"  (1.626 m)   Body mass index is 30.19 kg/m. Physical Exam Vitals reviewed.  Constitutional:      General: She is not in acute distress. HENT:     Head: Normocephalic.     Right Ear: There is no impacted cerumen.     Left Ear: There is no impacted cerumen.     Nose: Nose normal.     Mouth/Throat:     Mouth: Mucous membranes are moist.  Eyes:     General:        Right eye: No discharge.        Left eye: No discharge.  Cardiovascular:     Rate and Rhythm: Normal rate and regular rhythm.     Pulses: Normal pulses.     Heart sounds: Normal heart sounds.  Pulmonary:     Effort: Pulmonary effort is normal. No respiratory distress.     Breath sounds: Normal breath sounds. No wheezing.  Abdominal:     General: Bowel sounds are normal. There is no distension.     Palpations: Abdomen is soft.     Tenderness: There is no abdominal tenderness.  Musculoskeletal:     Cervical back: Neck supple.     Right lower leg: No edema.     Left lower leg: No edema.  Skin:    General: Skin is warm and dry.     Capillary Refill: Capillary refill takes less than 2 seconds.  Neurological:     General: No focal deficit present.     Mental Status: She is alert. Mental status is at baseline.     Motor: Weakness present.     Gait: Gait abnormal.     Comments: Walker/wheelchair  Psychiatric:        Mood and Affect: Mood normal.     Comments: Follows commands, alert to self/place/familiar face     Labs reviewed: Recent Labs    09/23/22 0000 02/13/23 0000 02/20/23 0000  NA 131* 129* 131*  K 4.2 4.3 4.5  CL 97* 94* 98*  CO2 28* 28* 26*  BUN 13 18 18    CREATININE 0.7 0.7 0.7  CALCIUM 9.3 9.6 9.2  Recent Labs    06/03/22 0000 07/26/22 0000  AST 13 15  ALT 9 12  ALKPHOS 67 62  ALBUMIN 3.8 3.9   Recent Labs    06/03/22 0000 07/26/22 0000  WBC 5.8 6.0  NEUTROABS 3,057.00 3,372.00  HGB 12.8 12.8  HCT 38 37  PLT 376 364   Lab Results  Component Value Date   TSH 4.50 07/26/2022   No results found for: "HGBA1C" Lab Results  Component Value Date   CHOL 190 07/26/2020   HDL 56 07/26/2020   LDLCALC 109 (H) 07/26/2020   TRIG 139 07/26/2020   CHOLHDL 3.4 07/26/2020    Significant Diagnostic Results in last 30 days:  No results found.  Assessment/Plan 1. Primary hypertension - ongoing, goal < 150/90 - 03/11 amlodipine and HCTZ discontinued - now on losartan 100 mg daily - weekly pressures still elevated - start blood pressures BID x 7 days> report to PCP  2. Late onset Alzheimer's 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 - unsuccessful trial of Namenda/Aricept/Exelon - no behaviors - dependent with ADLs except feeding - able to ambulates short distances with walker - weights stable - cont skilled nursing  3. Unstable gait - cont skilled nursing  4. Right hip pain - past x rays reveal severe OA - cont tylenol, voltaren gel and lidocaine patches  5. Senile osteoporosis - cont vitamin D  6. Mixed stress and urge urinary incontinence - cont oxybutynin  7. Postoperative hypothyroidism - TSH stable - cont levothyroxine  8. Hyponatremia - Na + 131  - not on medication    Family/ staff Communication: plan discussed with patient and nurse  Labs/tests ordered: none

## 2023-03-18 ENCOUNTER — Encounter: Payer: Self-pay | Admitting: Adult Health

## 2023-03-18 ENCOUNTER — Non-Acute Institutional Stay (SKILLED_NURSING_FACILITY): Payer: PPO | Admitting: Adult Health

## 2023-03-18 DIAGNOSIS — I10 Essential (primary) hypertension: Secondary | ICD-10-CM

## 2023-03-18 DIAGNOSIS — K625 Hemorrhage of anus and rectum: Secondary | ICD-10-CM | POA: Diagnosis not present

## 2023-03-18 MED ORDER — AMLODIPINE BESYLATE 10 MG PO TABS
10.0000 mg | ORAL_TABLET | Freq: Every day | ORAL | 9 refills | Status: AC
Start: 2023-03-18 — End: ?

## 2023-03-18 NOTE — Progress Notes (Addendum)
Location:  [facility] Room Number: 25-A Place of Service:  SNF (31) Provider:  Margit Banda. [name], NP   Patient Care Team: [name], MD as PCP - General (Internal Medicine) [name], MD as Consulting Physician (Endocrinology)  Extended Emergency Contact Information Primary Emergency Contact: [facility] Address: [address] [place]          [place], [place] [address] Frances Holland Home Phone: [phone] Mobile Phone: [phone] Relation: Spouse Secondary Emergency Contact: [name],[name] Address: [address]          [place], [place] [address] Frances Holland Home Phone: [phone] Work Phone: [phone] Mobile Phone: [phone] Relation: Son  Code Status:  DNR Goals of care: Advanced Directive information    03/18/2023    2:14 PM  Advanced Directives  Does Patient Have a Medical Advance Directive? Yes  Type of Estate agent of [place];Living will;Out of facility DNR (pink MOST or yellow form)  Does patient want to make changes to medical advance directive? No - Patient declined  Copy of Healthcare Power of Attorney in Chart? Yes - validated most recent copy scanned in chart (See row information)  Pre-existing out of facility DNR order (yellow form or pink MOST form) Pink MOST/Yellow Form most recent copy in chart - Physician notified to receive inpatient order     Chief Complaint  Patient presents with   Acute Visit    Bleeding hemorrhoids    HPI:  Pt is a 89 y.o. female seen today for an acute visit for bleeding hemorrhoids. She is a long-term care resident of [facility] [address]. Staff reported that whenever she moves her bowels and she gets wiped/cleaned, slight blood noted on the wipe. She was seen today in her room with husband at bedside. Staff reported that she moves her bowels regularly. She takes Senna-S 1 tab PO daily for constipation. SBPs ranging from 130 to 172.  Today BP 157/89. She denies headache nor dizziness. She takes Losartan and Amlodipine for hypertension.   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   Hypertension    Insomnia    Knee pain, bilateral 2014   Lymphedema    Left arm following mastectomy   Memory impairment    Memory loss    Migraine 05/16/2012   Migraine without aura, without mention of intractable migraine without mention of status migrainosus    patient denies at preop of 08/08/16    Other and unspecified hyperlipidemia    Other bursitis disorders    Other infective bursitis, left shoulder    Pain in joint, ankle and foot    Pain in joint, hand    Pain in joint, lower leg    Pain in joint, shoulder region    Pain in joint, shoulder region 04/23/2016   Pain in limb    Rectal bleeding 08/20/2016   Senile osteoporosis    Symptomatic menopausal or female climacteric states    Syncope and collapse    Unspecified hypertensive heart disease without heart failure 1995   Unspecified hypothyroidism    Unspecified polyarthropathy or polyarthritis,  site unspecified    Unspecified urinary incontinence    Unspecified visual loss    Xerophthalmia    Past Surgical History:  Procedure Laterality Date   ABDOMINAL HYSTERECTOMY     BILATERAL OOPHORECTOMY  1981   BREAST RECONSTRUCTION Left 01/2000   BREAST REDUCTION SURGERY Right 01/2000   CATARACT EXTRACTION Right 09/04/2005   COLONOSCOPY  01/2004   MASTECTOMY Left 10/1999   THYROID LOBECTOMY Left 08/14/2016   Procedure: NEAR TOTAL THYROIDECTOMY;  Surgeon: Luretha Murphy, MD;  Location: [facility];  Service: General;  Laterality: Left;   TOTAL ABDOMINAL HYSTERECTOMY W/ BILATERAL  SALPINGOOPHORECTOMY  1981   TOTAL KNEE ARTHROPLASTY Right 2014    Allergies  Allergen Reactions   Donepezil Hcl     Body pain and stiffness    Namenda [Memantine Hcl]      Caused body pain    Sulfa Antibiotics    Sulfamethoxazole Rash    Outpatient Encounter Medications as of 03/18/2023  Medication Sig   acetaminophen (TYLENOL) 325 MG tablet Take 325 mg by mouth 3 (three) times daily. Give 2 tablets (total of 650 mg) for pain.   amLODipine (NORVASC) 5 MG tablet Take 5 mg by mouth daily.   Cholecalciferol 5000 units capsule Take 5,000 Units by mouth daily.   diclofenac Sodium (VOLTAREN) 1 % GEL Apply 2 g topically 2 (two) times a week. Apply to right hip/thigh one time daily for pain after shower on Tuesday and Friday.   levothyroxine (SYNTHROID) 112 MCG tablet Take 112 mcg by mouth daily before breakfast.   lidocaine (HM LIDOCAINE PATCH) 4 % Place 2 patches onto the skin daily. Apply to lower back topically one time a day.   losartan (COZAAR) 100 MG tablet Take 100 mg by mouth daily.   melatonin 5 MG TABS Take 1 tablet (5 mg total) by mouth at bedtime.   Menthol, Topical Analgesic, (BIOFREEZE) 4 % GEL Apply 1 Application topically as needed. Apply to lower legs topically as needed for pain.   Multiple Vitamins-Minerals (THEREMS-M) TABS Take 1 tablet by mouth every morning.   Omega-3 Fatty Acids (OMEGA-3 CF PO) Take 1,000 mg by mouth every morning. With food   Ostomy Supplies (SKIN PREP WIPES) MISC 2 Applications by Does not apply route 2 (two) times daily. Apply to heels topically for skin care treatment.   oxybutynin (DITROPAN XL) 15 MG 24 hr tablet Take 15 mg by mouth at bedtime.   Polyethyl Glycol-Propyl Glycol 0.4-0.3 % SOLN Place 2 drops into both eyes daily as needed (dry eyes).    senna (SENOKOT) 8.6 MG TABS tablet Take 1 tablet by mouth as needed for mild constipation.   senna-docusate (SENNA S) 8.6-50 MG tablet Take 1 tablet by mouth daily.   triamcinolone (KENALOG) 0.025 %  cream Apply 1 application  topically as needed (Apply to affected area topically).   zinc oxide 20 % ointment Apply 1 application  topically as needed for irritation. Apply to buttocks topically after every incontinent episode for redness   No facility-administered encounter medications on file as of 03/18/2023.    Review of Systems  Constitutional:  Negative for appetite change, chills, fatigue and fever.  HENT:  Negative for congestion, hearing loss, rhinorrhea and sore throat.   Eyes: Negative.   Respiratory:  Negative for cough, shortness of breath and wheezing.   Cardiovascular:  Negative for chest pain, palpitations and leg swelling.  Gastrointestinal:  Negative for abdominal pain, constipation, diarrhea, nausea and vomiting.  Genitourinary:  Negative for  dysuria.  Musculoskeletal:  Negative for arthralgias, back pain and myalgias.  Skin:  Negative for color change, rash and wound.  Neurological:  Negative for dizziness, weakness and headaches.  Psychiatric/Behavioral:  Negative for behavioral problems. The patient is not nervous/anxious.     Immunization History  Administered Date(s) Administered   Fluad Quad(high Dose 65+) 10/02/2022   Influenza, High Dose Seasonal PF 09/17/2017, 09/10/2018, 09/22/2019, 09/13/2021   Influenza, Quadrivalent, Recombinant, Inj, Pf 08/29/2020   Influenza,inj,Quad PF,6+ Mos 09/10/2018   Influenza-Unspecified 08/10/2013, 09/07/2015, 09/13/2016   Moderna Covid-19 Vaccine Bivalent Booster 1441yrs & up 05/22/2022   Moderna SARS-COV2 Booster Vaccination 05/16/2021   Moderna Sars-Covid-2 Vaccination 12/13/2019, 01/10/2020, 10/23/2020   PFIZER(Purple Top)SARS-COV-2 Vaccination 08/29/2021   Pfizer Covid-19 Vaccine Bivalent Booster 6958yrs & up 10/15/2022   Pneumococcal Conjugate-13 06/08/2017, 11/02/2018   Pneumococcal Polysaccharide-23 12/09/2002, 06/08/2017, 10/22/2017, 11/02/2018   Pneumococcal-Unspecified 06/08/2017, 11/02/2018   Tdap 02/21/2014,  10/29/2017   Zoster Recombinat (Shingrix) 07/18/2017, 01/10/2018   Zoster, Live 07/18/2017, 01/10/2018   Pertinent  Health Maintenance Due  Topic Date Due   INFLUENZA VACCINE  07/10/2023   DEXA SCAN  Completed      02/18/2022    2:20 PM 11/18/2022    2:34 PM 12/16/2022   10:20 AM 02/21/2023    2:43 PM 03/18/2023    2:13 PM  Fall Risk  Falls in the past year?  0 0 1 0  Was there an injury with Fall? 0 0 0 0 0  Fall Risk Category Calculator  0 0 2 0  Fall Risk Category (Retired)  Low Low    (RETIRED) Patient Fall Risk Level Moderate fall risk Moderate fall risk Moderate fall risk    Patient at Risk for Falls Due to History of fall(s);Impaired balance/gait;Impaired mobility History of fall(s) History of fall(s) History of fall(s);Impaired balance/gait History of fall(s);Impaired balance/gait  Patient at Risk for Falls Due to - Comments    H/o dementia   Fall risk Follow up Falls evaluation completed;Education provided;Falls prevention discussed Falls evaluation completed Falls evaluation completed Falls evaluation completed;Education provided;Falls prevention discussed Falls evaluation completed   Functional Status Survey:    Vitals:   03/18/23 1404  BP: (!) 157/89  Pulse: 75  Resp: 18  Temp: 98.2 F (36.8 C)  SpO2: 98%  Weight: 175 lb 9 oz (79.6 kg)  Height: 5\' 4"  (1.626 m)   Body mass index is 30.14 kg/m. Physical Exam Constitutional:      General: She is not in acute distress.    Appearance: She is obese.  HENT:     Head: Normocephalic and atraumatic.     Nose: Nose normal.     Mouth/Throat:     Mouth: Mucous membranes are moist.  Eyes:     Conjunctiva/sclera: Conjunctivae normal.  Cardiovascular:     Rate and Rhythm: Normal rate and regular rhythm.  Pulmonary:     Effort: Pulmonary effort is normal.     Breath sounds: Normal breath sounds.  Abdominal:     General: Bowel sounds are normal.     Palpations: Abdomen is soft.  Musculoskeletal:        General:  Normal range of motion.     Cervical back: Normal range of motion.  Skin:    General: Skin is warm and dry.  Neurological:     Mental Status: She is alert. Mental status is at baseline. She is disoriented.  Psychiatric:        Mood and Affect: Mood normal.  Behavior: Behavior normal.        Thought Content: Thought content normal.        Judgment: Judgment normal.     Labs reviewed: Recent Labs    09/23/22 0000 02/13/23 0000 02/20/23 0000  NA 131* 129* 131*  K 4.2 4.3 4.5  CL 97* 94* 98*  CO2 28* 28* 26*  BUN 13 18 18   CREATININE 0.7 0.7 0.7  CALCIUM 9.3 9.6 9.2   Recent Labs    06/03/22 0000 07/26/22 0000  AST 13 15  ALT 9 12  ALKPHOS 67 62  ALBUMIN 3.8 3.9   Recent Labs    06/03/22 0000 07/26/22 0000  WBC 5.8 6.0  NEUTROABS 3,057.00 3,372.00  HGB 12.8 12.8  HCT 38 37  PLT 376 364   Lab Results  Component Value Date   TSH 4.50 07/26/2022   No results found for: "HGBA1C" Lab Results  Component Value Date   CHOL 190 07/26/2020   HDL 56 07/26/2020   LDLCALC 109 (H) 07/26/2020   TRIG 139 07/26/2020   CHOLHDL 3.4 07/26/2020    Significant Diagnostic Results in last 30 days:  No results found.  Assessment/Plan  1. Primary hypertension -  will increase Amlodipine from 5 mg daily to 10 mg daily -  continue Losartan -  monitor BPs - amLODipine (NORVASC) 10 MG tablet; Take 1 tablet (10 mg total) by mouth daily.  Dispense: 30 tablet; Refill: 9  2. Rectal bleeding -  minimal bleeding -  thought to be from hemorrhoids -  will start on Reguloid powder 1 tsp daily -  continue Senna-S    Family/ staff Communication:  Discussed plan of care with resident, husband and charge nurse.  Labs/tests ordered:  None

## 2023-03-27 DIAGNOSIS — I10 Essential (primary) hypertension: Secondary | ICD-10-CM | POA: Diagnosis not present

## 2023-03-27 LAB — BASIC METABOLIC PANEL
BUN: 18 (ref 4–21)
CO2: 27 — AB (ref 13–22)
Chloride: 100 (ref 99–108)
Creatinine: 0.7 (ref 0.5–1.1)
Glucose: 90
Potassium: 4.5 mEq/L (ref 3.5–5.1)
Sodium: 133 — AB (ref 137–147)

## 2023-03-27 LAB — COMPREHENSIVE METABOLIC PANEL: Calcium: 9.5 (ref 8.7–10.7)

## 2023-04-30 ENCOUNTER — Encounter: Payer: Self-pay | Admitting: Orthopedic Surgery

## 2023-04-30 ENCOUNTER — Non-Acute Institutional Stay (SKILLED_NURSING_FACILITY): Payer: PPO | Admitting: Orthopedic Surgery

## 2023-04-30 DIAGNOSIS — K644 Residual hemorrhoidal skin tags: Secondary | ICD-10-CM | POA: Diagnosis not present

## 2023-04-30 MED ORDER — HYDROCORTISONE (PERIANAL) 2.5 % EX CREA
1.0000 | TOPICAL_CREAM | Freq: Two times a day (BID) | CUTANEOUS | 0 refills | Status: AC
Start: 2023-04-30 — End: 2023-05-10

## 2023-04-30 NOTE — Progress Notes (Signed)
Location:   [facility] Room Number: 25-A Place of Service:  SNF [phone]) Provider:  Hazle Nordmann, NP  PCP: [name], MD  Patient Care Team: [name], MD as PCP - General (Internal Medicine) [name], MD as Consulting Physician (Endocrinology)  Extended Emergency Contact Information Primary Emergency Contact: Dosher,Gordon Address: [address]          [place], [place] [address] Darden Amber of [place] Home Phone: [phone] Mobile Phone: [phone] Relation: Spouse Secondary Emergency Contact: [name],[name] Address: [address]          [place], [place] [address] Darden Amber of [place] Home Phone: [phone] Work Phone: [phone] Mobile Phone: [phone] Relation: Son  Code Status:  DNR Goals of care: Advanced Directive information    04/30/2023    9:04 AM  Advanced Directives  Does Patient Have a Medical Advance Directive? Yes  Type of Estate agent of [place];Living will;Out of facility DNR (pink MOST or yellow form)  Does patient want to make changes to medical advance directive? No - Patient declined  Copy of Healthcare Power of Attorney in Chart? Yes - validated most recent copy scanned in chart (See row information)     Chief Complaint  Patient presents with   Acute Visit    Hemorrhoids.     HPI:  Pt is a 89 y.o. female seen today for an acute visit due to external hemorrhoid.   She currently resides on the skilled nursing unit at [facility]. PMH: HTN, migraines, rectal bleeding, hypothyroidism, s/p thyroidectomy (left) 2017, dementia, osteoporosis, constipation, h/o breast and skin cancer, abnormal gait and insomnia.   Nursing reports increased rectal bleeding with peri care. H/o external hemorrhoids. She uses Preparation H as needed. Poor historian due to dementia. She admits to mild discomfort and itching. Currently taking senna once daily for hard stools.   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   Hypertension    Insomnia    Knee pain, bilateral 2014   Lymphedema    Left arm following mastectomy   Memory impairment    Memory loss    Migraine 05/16/2012   Migraine without aura, without mention of intractable migraine without mention of status migrainosus    patient denies at preop of 08/08/16    Other and unspecified hyperlipidemia    Other bursitis disorders    Other infective bursitis, left shoulder    Pain in joint, ankle and foot    Pain in joint, hand    Pain in joint, lower leg    Pain in joint, shoulder region    Pain in joint, shoulder region 04/23/2016   Pain in limb    Rectal bleeding 08/20/2016   Senile osteoporosis    Symptomatic menopausal or female climacteric states    Syncope and collapse    Unspecified hypertensive heart disease without heart failure 1995   Unspecified hypothyroidism    Unspecified polyarthropathy or polyarthritis, site unspecified    Unspecified urinary incontinence    Unspecified visual loss    Xerophthalmia    Past  Surgical History:  Procedure Laterality Date   ABDOMINAL HYSTERECTOMY     BILATERAL OOPHORECTOMY  1981   BREAST RECONSTRUCTION Left 01/2000   BREAST REDUCTION SURGERY Right 01/2000   CATARACT EXTRACTION Right 09/04/2005   COLONOSCOPY  01/2004   MASTECTOMY Left 10/1999   THYROID LOBECTOMY Left 08/14/2016   Procedure: NEAR TOTAL THYROIDECTOMY;  Surgeon: Luretha Murphy, MD;  Location: [facility];  Service: General;  Laterality: Left;   TOTAL ABDOMINAL HYSTERECTOMY W/ BILATERAL SALPINGOOPHORECTOMY  1981   TOTAL KNEE ARTHROPLASTY Right 2014    Allergies  Allergen Reactions   Donepezil  Hcl     Body pain and stiffness    Namenda [Memantine Hcl]      Caused body pain    Sulfa Antibiotics    Sulfamethoxazole Rash    Allergies as of 04/30/2023       Reactions   Donepezil Hcl    Body pain and stiffness   Namenda [memantine Hcl]     Caused body pain    Sulfa Antibiotics    Sulfamethoxazole Rash        Medication List        Accurate as of Apr 30, 2023  9:05 AM. If you have any questions, ask your nurse or doctor.          acetaminophen 325 MG tablet Commonly known as: TYLENOL Take 650 mg by mouth 3 (three) times daily.   amLODipine 10 MG tablet Commonly known as: NORVASC Take 1 tablet (10 mg total) by mouth daily.   Biofreeze 4 % Gel Generic drug: Menthol (Topical Analgesic) Apply 1 Application topically as needed. Apply to lower legs topically as needed for pain.   Cholecalciferol 125 MCG (5000 UT) capsule Take 5,000 Units by mouth daily.   diclofenac Sodium 1 % Gel Commonly known as: VOLTAREN Apply 2 g topically 2 (two) times a week. Apply to right hip/thigh one time daily for pain after shower on Tuesday and Friday.   HM Lidocaine Patch 4 % Generic drug: lidocaine Place 2 patches onto the skin daily. Apply to lower back topically one time a day.   levothyroxine 112 MCG tablet Commonly known as: SYNTHROID Take 112 mcg by mouth daily before breakfast.   losartan 100 MG tablet Commonly known as: COZAAR Take 100 mg by mouth daily.   melatonin 5 MG Tabs Take 1 tablet (5 mg total) by mouth at bedtime.   OMEGA-3 CF PO Take 1,000 mg by mouth every morning. With food   oxybutynin 15 MG 24 hr tablet Commonly known as: DITROPAN XL Take 15 mg by mouth at bedtime.   Polyethyl Glycol-Propyl Glycol 0.4-0.3 % Soln Place 2 drops into both eyes daily as needed (dry eyes).   Reguloid 48.57 % Powd Generic drug: Psyllium Take by mouth daily. 1tsp   senna 8.6 MG Tabs tablet Commonly known as: SENOKOT Take 1 tablet by mouth as needed for mild  constipation.   Senna S 8.6-50 MG tablet Generic drug: senna-docusate Take 1 tablet by mouth daily.   Skin Prep Wipes Misc 2 Applications by Does not apply route 2 (two) times daily. Apply to heels topically for skin care treatment.   Therems-M Tabs Take 1 tablet by mouth every morning.   triamcinolone 0.025 % cream Commonly known as: KENALOG Apply 1 application  topically as needed (Apply to affected area topically).   zinc oxide 20 % ointment Apply 1 application  topically as needed for irritation. Apply to buttocks topically after every incontinent episode  for redness        Review of Systems  Unable to perform ROS: Dementia    Immunization History  Administered Date(s) Administered   Fluad Quad(high Dose 65+) 10/02/2022   Influenza, High Dose Seasonal PF 09/17/2017, 09/10/2018, 09/22/2019, 09/13/2021   Influenza, Quadrivalent, Recombinant, Inj, Pf 08/29/2020   Influenza,inj,Quad PF,6+ Mos 09/10/2018   Influenza-Unspecified 08/10/2013, 09/07/2015, 09/13/2016   Moderna Covid-19 Vaccine Bivalent Booster 61yrs & up 05/22/2022   Moderna SARS-COV2 Booster Vaccination 05/16/2021   Moderna Sars-Covid-2 Vaccination 12/13/2019, 01/10/2020, 10/23/2020   PFIZER(Purple Top)SARS-COV-2 Vaccination 08/29/2021   Pfizer Covid-19 Vaccine Bivalent Booster 43yrs & up 10/15/2022   Pneumococcal Conjugate-13 06/08/2017, 11/02/2018   Pneumococcal Polysaccharide-23 12/09/2002, 06/08/2017, 10/22/2017, 11/02/2018   Pneumococcal-Unspecified 06/08/2017, 11/02/2018   Tdap 02/21/2014, 10/29/2017   Zoster Recombinat (Shingrix) 07/18/2017, 01/10/2018   Zoster, Live 07/18/2017, 01/10/2018   Pertinent  Health Maintenance Due  Topic Date Due   INFLUENZA VACCINE  07/10/2023   DEXA SCAN  Completed      02/18/2022    2:20 PM 11/18/2022    2:34 PM 12/16/2022   10:20 AM 02/21/2023    2:43 PM 03/18/2023    2:13 PM  Fall Risk  Falls in the past year?  0 0 1 0  Was there an injury with Fall? 0 0 0 0 0   Fall Risk Category Calculator  0 0 2 0  Fall Risk Category (Retired)  Low Low    (RETIRED) Patient Fall Risk Level Moderate fall risk Moderate fall risk Moderate fall risk    Patient at Risk for Falls Due to History of fall(s);Impaired balance/gait;Impaired mobility History of fall(s) History of fall(s) History of fall(s);Impaired balance/gait History of fall(s);Impaired balance/gait  Patient at Risk for Falls Due to - Comments    H/o dementia   Fall risk Follow up Falls evaluation completed;Education provided;Falls prevention discussed Falls evaluation completed Falls evaluation completed Falls evaluation completed;Education provided;Falls prevention discussed Falls evaluation completed   Functional Status Survey:    Vitals:   04/30/23 0849  BP: 122/70  Pulse: 74  Resp: 20  Temp: (!) 97.1 F (36.2 C)  SpO2: 97%  Weight: 175 lb 4.8 oz (79.5 kg)  Height: 5\' 4"  (1.626 m)   Body mass index is 30.09 kg/m. Physical Exam Vitals reviewed. Exam conducted with a chaperone present.  Constitutional:      General: She is not in acute distress. HENT:     Head: Normocephalic.  Eyes:     General:        Right eye: No discharge.        Left eye: No discharge.  Cardiovascular:     Rate and Rhythm: Normal rate and regular rhythm.     Pulses: Normal pulses.     Heart sounds: Normal heart sounds.  Pulmonary:     Effort: Pulmonary effort is normal. No respiratory distress.     Breath sounds: Normal breath sounds. No wheezing.  Abdominal:     General: There is no distension.     Tenderness: There is no abdominal tenderness.  Genitourinary:    Comments: Moderate sized external hemorrhoid surrounding rectum, skin appears dry with dried blood Musculoskeletal:     Cervical back: Neck supple.     Right lower leg: No edema.     Left lower leg: No edema.  Skin:    General: Skin is warm.     Capillary Refill: Capillary refill takes less than 2 seconds.  Neurological:     General: No  focal  deficit present.     Mental Status: She is alert.  Psychiatric:        Mood and Affect: Mood normal.     Labs reviewed: Recent Labs    02/13/23 0000 02/20/23 0000 03/27/23 0000  NA 129* 131* 133*  K 4.3 4.5 4.5  CL 94* 98* 100  CO2 28* 26* 27*  BUN 18 18 18   CREATININE 0.7 0.7 0.7  CALCIUM 9.6 9.2 9.5   Recent Labs    06/03/22 0000 07/26/22 0000  AST 13 15  ALT 9 12  ALKPHOS 67 62  ALBUMIN 3.8 3.9   Recent Labs    06/03/22 0000 07/26/22 0000  WBC 5.8 6.0  NEUTROABS 3,057.00 3,372.00  HGB 12.8 12.8  HCT 38 37  PLT 376 364   Lab Results  Component Value Date   TSH 4.50 07/26/2022   No results found for: "HGBA1C" Lab Results  Component Value Date   CHOL 190 07/26/2020   HDL 56 07/26/2020   LDLCALC 109 (H) 07/26/2020   TRIG 139 07/26/2020   CHOLHDL 3.4 07/26/2020    Significant Diagnostic Results in last 30 days:  No results found.  Assessment/Plan 1. External hemorrhoid, bleeding - 05/21 rectal bleeding noted by staff - moderate sized external hemorrhoid surrounding rectum - c/o discomfort and itching - start anusol BID x 10 days - will increase senna to 2 tablets daily - if size does not improve, consider Tucks or witch hazel applications on cotton round at night    Family/ staff Communication: plan discusses with patient and nurse  Labs/tests ordered:  none

## 2023-05-21 ENCOUNTER — Encounter: Payer: Self-pay | Admitting: Orthopedic Surgery

## 2023-05-21 ENCOUNTER — Non-Acute Institutional Stay (SKILLED_NURSING_FACILITY): Payer: PPO | Admitting: Orthopedic Surgery

## 2023-05-21 DIAGNOSIS — M25551 Pain in right hip: Secondary | ICD-10-CM | POA: Diagnosis not present

## 2023-05-21 DIAGNOSIS — K644 Residual hemorrhoidal skin tags: Secondary | ICD-10-CM | POA: Diagnosis not present

## 2023-05-21 DIAGNOSIS — H6122 Impacted cerumen, left ear: Secondary | ICD-10-CM | POA: Diagnosis not present

## 2023-05-21 DIAGNOSIS — M810 Age-related osteoporosis without current pathological fracture: Secondary | ICD-10-CM

## 2023-05-21 DIAGNOSIS — E871 Hypo-osmolality and hyponatremia: Secondary | ICD-10-CM

## 2023-05-21 DIAGNOSIS — F0280 Dementia in other diseases classified elsewhere without behavioral disturbance: Secondary | ICD-10-CM

## 2023-05-21 DIAGNOSIS — R2681 Unsteadiness on feet: Secondary | ICD-10-CM

## 2023-05-21 DIAGNOSIS — G301 Alzheimer's disease with late onset: Secondary | ICD-10-CM

## 2023-05-21 DIAGNOSIS — I10 Essential (primary) hypertension: Secondary | ICD-10-CM | POA: Diagnosis not present

## 2023-05-21 DIAGNOSIS — N3946 Mixed incontinence: Secondary | ICD-10-CM | POA: Diagnosis not present

## 2023-05-21 DIAGNOSIS — E890 Postprocedural hypothyroidism: Secondary | ICD-10-CM

## 2023-05-21 MED ORDER — DEBROX 6.5 % OT SOLN
5.0000 [drp] | Freq: Every evening | OTIC | 0 refills | Status: DC
Start: 2023-05-21 — End: 2023-06-19

## 2023-05-21 MED ORDER — WITCH HAZEL-GLYCERIN EX PADS
1.0000 | MEDICATED_PAD | Freq: Every evening | CUTANEOUS | 12 refills | Status: DC
Start: 2023-05-21 — End: 2023-07-18

## 2023-05-21 MED ORDER — HYDROCORTISONE (PERIANAL) 2.5 % EX CREA
1.0000 | TOPICAL_CREAM | Freq: Two times a day (BID) | CUTANEOUS | 0 refills | Status: AC
Start: 2023-05-21 — End: 2023-06-20

## 2023-05-21 NOTE — Progress Notes (Signed)
Location:  [facility] Room Number: 25/A Place of Service:  SNF [phone]) Provider:  Octavia Heir, NP   [name], MD  Patient Care Team: [name], MD as PCP - General (Internal Medicine) [name], MD as Consulting Physician (Endocrinology)  Extended Emergency Contact Information Primary Emergency Contact: Lesko,Gordon Address: [address]          [place], [place] [address] Darden [place] of [place] Home Phone: [phone] Mobile Phone: [phone] Relation: Spouse Secondary Emergency Contact: [name],[name] Address: [address]          [place], [place] [address] D[place] of [place] Home Phone: [phone] Work Phone: [phone] Mobile Phone: [phone] Relation: Son  Code Status:  DNR Goals of care: Advanced Directive information    04/30/2023    9:04 AM  Advanced Directives  Does Patient Have a Medical Advance Directive? Yes  Type of Estate agent of [place];Living will;Out of facility DNR (pink MOST or yellow form)  Does patient want to make changes to medical advance directive? No - Patient declined  Copy of Healthcare Power of Attorney in Chart? Yes - validated most recent copy scanned in chart (See row information)     Chief Complaint  Patient presents with   Medical Management of Chronic Issues    HPI:  Pt is a 89 y.o. female seen today for medical management of chronic diseases.    She currently resides on the skilled nursing unit at [facility]. PMH: HTN, migraines, rectal bleeding, hypothyroidism, s/p thyroidectomy (left) 2017, dementia, osteoporosis, constipation, h/o breast and skin cancer, abnormal gait and insomnia.   External hemorrhoid-  noted 05/22, continues to have intermittent rectal bleeding, remains on stool softener and Anusol HTN- BUN/creat 18/0.7 03/27/2023, remains on losartan Dementia- MMSE 23/30 02/2022, BIMS score 11/15 (06/10)> was 10/15 (02/2023),  no behavioral outbursts, dependent with ADLs except feeding,  unable to tolerate Namenda/Aricept in past/ rash with Exelon patch, not on medication Unstable gait- no recent falls, uses wheelchair for long distances Right hip pain- past xrays suggest severe arthritis, remains on tylenol, voltaren gel and lidocaine patches Senile osteoporosis- DEXA 09/2021, remains on vitamin D Urinary incontinence- stable with oxybutynin Hypothyroidism- s/p left lobe thyroidectomy 2017, TSH 4.50 07/26/2022, remains on levothyroxine  Hyponatremia- Na + 133 03/27/2023  Recent blood pressures:  06/11- 138/86  06/04- 131/72  05/29- 146/82  Recent weights:  06/01- 176.5 lbs  05/01- 175.3 lbs  04/01- 175.9 lbs      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   Hypertension    Insomnia    Knee pain, bilateral 2014   Lymphedema    Left arm following mastectomy   Memory impairment    Memory loss    Migraine 05/16/2012   Migraine without aura, without mention of intractable migraine without mention of status migrainosus    patient denies at preop of 08/08/16    Other and unspecified hyperlipidemia    Other bursitis disorders    Other infective bursitis, left shoulder    Pain in joint, ankle and foot  Pain in joint, hand    Pain in joint, lower leg    Pain in joint, shoulder region    Pain in joint, shoulder region 04/23/2016   Pain in limb    Rectal bleeding 08/20/2016   Senile osteoporosis    Symptomatic menopausal or female climacteric states    Syncope and collapse    Unspecified hypertensive heart disease without heart failure 1995   Unspecified hypothyroidism    Unspecified  polyarthropathy or polyarthritis, site unspecified    Unspecified urinary incontinence    Unspecified visual loss    Xerophthalmia    Past Surgical History:  Procedure Laterality Date   ABDOMINAL HYSTERECTOMY     BILATERAL OOPHORECTOMY  1981   BREAST RECONSTRUCTION Left 01/2000   BREAST REDUCTION SURGERY Right 01/2000   CATARACT EXTRACTION Right 09/04/2005   COLONOSCOPY  01/2004   MASTECTOMY Left 10/1999   THYROID LOBECTOMY Left 08/14/2016   Procedure: NEAR TOTAL THYROIDECTOMY;  Surgeon: Luretha Murphy, MD;  Location: [facility];  Service: General;  Laterality: Left;   TOTAL ABDOMINAL HYSTERECTOMY W/ BILATERAL SALPINGOOPHORECTOMY  1981   TOTAL KNEE ARTHROPLASTY Right 2014    Allergies  Allergen Reactions   Donepezil Hcl     Body pain and stiffness    Namenda [Memantine Hcl]      Caused body pain    Sulfa Antibiotics    Sulfamethoxazole Rash    Outpatient Encounter Medications as of 05/21/2023  Medication Sig   acetaminophen (TYLENOL) 325 MG tablet Take 650 mg by mouth 3 (three) times daily.   amLODipine (NORVASC) 10 MG tablet Take 1 tablet (10 mg total) by mouth daily.   Cholecalciferol 5000 units capsule Take 5,000 Units by mouth daily.   diclofenac Sodium (VOLTAREN) 1 % GEL Apply 2 g topically 2 (two) times a week. Apply to right hip/thigh one time daily for pain after shower on Tuesday and Friday.   levothyroxine (SYNTHROID) 112 MCG tablet Take 112 mcg by mouth daily before breakfast.   lidocaine (HM LIDOCAINE PATCH) 4 % Place 2 patches onto the skin daily. Apply to lower back topically one time a day.   losartan (COZAAR) 100 MG tablet Take 100 mg by mouth daily.   melatonin 5 MG TABS Take 1 tablet (5 mg total) by mouth at bedtime.   Menthol, Topical Analgesic, (BIOFREEZE) 4 % GEL Apply 1 Application topically as needed. Apply to lower legs topically as needed for pain.   Multiple Vitamins-Minerals (THEREMS-M) TABS Take 1 tablet by mouth every morning.   Omega-3 Fatty Acids  (OMEGA-3 CF PO) Take 1,000 mg by mouth every morning. With food   Ostomy Supplies (SKIN PREP WIPES) MISC 2 Applications by Does not apply route 2 (two) times daily. Apply to heels topically for skin care treatment.   oxybutynin (DITROPAN XL) 15 MG 24 hr tablet Take 15 mg by mouth at bedtime.   Polyethyl Glycol-Propyl Glycol 0.4-0.3 % SOLN Place 2 drops into both eyes daily as needed (dry eyes).    Psyllium (REGULOID) 48.57 % POWD Take by mouth daily. 1tsp   senna-docusate (SENNA S) 8.6-50 MG tablet Take 2 tablets by mouth daily.   triamcinolone (KENALOG) 0.025 % cream Apply 1 application  topically as needed (Apply to affected area topically).   zinc oxide 20 % ointment Apply 1 application  topically as needed for irritation. Apply to buttocks topically after every incontinent episode for redness   No facility-administered encounter medications on file as of 05/21/2023.    Review of  Systems  Unable to perform ROS: Dementia    Immunization History  Administered Date(s) Administered   Fluad Quad(high Dose 65+) 10/02/2022   Influenza, High Dose Seasonal PF 09/17/2017, 09/10/2018, 09/22/2019, 09/13/2021   Influenza, Quadrivalent, Recombinant, Inj, Pf 08/29/2020   Influenza,inj,Quad PF,6+ Mos 09/10/2018   Influenza-Unspecified 08/10/2013, 09/07/2015, 09/13/2016   Moderna Covid-19 Vaccine Bivalent Booster 3yrs & up 05/22/2022   Moderna SARS-COV2 Booster Vaccination 05/16/2021   Moderna Sars-Covid-2 Vaccination 12/13/2019, 01/10/2020, 10/23/2020   PFIZER(Purple Top)SARS-COV-2 Vaccination 08/29/2021   Pfizer Covid-19 Vaccine Bivalent Booster 75yrs & up 10/15/2022   Pneumococcal Conjugate-13 06/08/2017, 11/02/2018   Pneumococcal Polysaccharide-23 12/09/2002, 06/08/2017, 10/22/2017, 11/02/2018   Pneumococcal-Unspecified 06/08/2017, 11/02/2018   Tdap 02/21/2014, 10/29/2017   Zoster Recombinat (Shingrix) 07/18/2017, 01/10/2018   Zoster, Live 07/18/2017, 01/10/2018   Pertinent  Health  Maintenance Due  Topic Date Due   INFLUENZA VACCINE  07/10/2023   DEXA SCAN  Completed      02/18/2022    2:20 PM 11/18/2022    2:34 PM 12/16/2022   10:20 AM 02/21/2023    2:43 PM 03/18/2023    2:13 PM  Fall Risk  Falls in the past year?  0 0 1 0  Was there an injury with Fall? 0 0 0 0 0  Fall Risk Category Calculator  0 0 2 0  Fall Risk Category (Retired)  Low Low    (RETIRED) Patient Fall Risk Level Moderate fall risk Moderate fall risk Moderate fall risk    Patient at Risk for Falls Due to History of fall(s);Impaired balance/gait;Impaired mobility History of fall(s) History of fall(s) History of fall(s);Impaired balance/gait History of fall(s);Impaired balance/gait  Patient at Risk for Falls Due to - Comments    H/o dementia   Fall risk Follow up Falls evaluation completed;Education provided;Falls prevention discussed Falls evaluation completed Falls evaluation completed Falls evaluation completed;Education provided;Falls prevention discussed Falls evaluation completed   Functional Status Survey:    Vitals:   05/21/23 1533  BP: 138/86  Pulse: 75  Resp: 18  Temp: 97.9 F (36.6 C)  SpO2: 97%  Weight: 176 lb 8 oz (80.1 kg)  Height: 5\' 4"  (1.626 m)   Body mass index is 30.3 kg/m. Physical Exam Vitals reviewed.  Constitutional:      General: She is not in acute distress. HENT:     Head: Normocephalic.     Right Ear: There is no impacted cerumen.     Left Ear: There is impacted cerumen.     Nose: Nose normal.     Mouth/Throat:     Mouth: Mucous membranes are moist.  Eyes:     General:        Right eye: No discharge.        Left eye: No discharge.  Cardiovascular:     Rate and Rhythm: Normal rate and regular rhythm.     Pulses: Normal pulses.     Heart sounds: Normal heart sounds.  Pulmonary:     Effort: Pulmonary effort is normal. No respiratory distress.     Breath sounds: Normal breath sounds. No wheezing or rales.  Abdominal:     General: Bowel sounds are  normal.     Palpations: Abdomen is soft.  Musculoskeletal:     Cervical back: Neck supple.     Right lower leg: No edema.     Left lower leg: No edema.  Skin:    General: Skin is warm.     Capillary Refill: Capillary refill takes less than 2 seconds.  Neurological:     General: No focal deficit present.     Mental Status: She is alert. Mental status is at baseline.     Motor: Weakness present.     Gait: Gait abnormal.     Comments: wheelchair  Psychiatric:        Mood and Affect: Mood normal.     Labs reviewed: Recent Labs    02/13/23 0000 02/20/23 0000 03/27/23 0000  NA 129* 131* 133*  K 4.3 4.5 4.5  CL 94* 98* 100  CO2 28* 26* 27*  BUN 18 18 18   CREATININE 0.7 0.7 0.7  CALCIUM 9.6 9.2 9.5   Recent Labs    06/03/22 0000 07/26/22 0000  AST 13 15  ALT 9 12  ALKPHOS 67 62  ALBUMIN 3.8 3.9   Recent Labs    06/03/22 0000 07/26/22 0000  WBC 5.8 6.0  NEUTROABS 3,057.00 3,372.00  HGB 12.8 12.8  HCT 38 37  PLT 376 364   Lab Results  Component Value Date   TSH 4.50 07/26/2022   No results found for: "HGBA1C" Lab Results  Component Value Date   CHOL 190 07/26/2020   HDL 56 07/26/2020   LDLCALC 109 (H) 07/26/2020   TRIG 139 07/26/2020   CHOLHDL 3.4 07/26/2020    Significant Diagnostic Results in last 30 days:  No results found.  Assessment/Plan 1. Left ear impacted cerumen - unable to see TM - start debrox x 5 days - flush left ear when debrox complete - carbamide peroxide (DEBROX) 6.5 % OTIC solution; Place 5 drops into the left ear at bedtime.  Dispense: 15 mL; Refill: 0  2. External hemorrhoid, bleeding - ongoing - still moderate size  - continues to have intermittent bleeding - will try TUCKS qhs- ok to change if soiled - restart anusol cream BID x 30 days - hydrocortisone (ANUSOL-HC) 2.5 % rectal cream; Place 1 Application rectally 2 (two) times daily.  Dispense: 30 g; Refill: 0 - witch hazel-glycerin (TUCKS) pad; Apply 1 Application  topically at bedtime. Change if soilded  Dispense: 40 each; Refill: 12  3. Primary hypertension - controlled with losartan  4. Late onset Alzheimer's 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 (HCC) - no behaviors - dependent with ADLs except feeding - ambulates with wheelchair - weights stable - cont skilled nursing  5. Unstable gait - cont skilled nursing  6. Right hip pain - cont tylenol and voltaren gel  7. Senile osteoporosis - cont vitamin D   8. Mixed stress and urge urinary incontinence - ongoing - cont oxybutynin  9. Postoperative hypothyroidism - TSH stable - cont levothyroxine  10. Hyponatremia - Na+ stable     Family/ staff Communication: plan discussed with patient and nurse  Labs/tests ordered:  none

## 2023-05-23 DIAGNOSIS — M79671 Pain in right foot: Secondary | ICD-10-CM | POA: Diagnosis not present

## 2023-05-23 DIAGNOSIS — M79672 Pain in left foot: Secondary | ICD-10-CM | POA: Diagnosis not present

## 2023-05-23 DIAGNOSIS — L602 Onychogryphosis: Secondary | ICD-10-CM | POA: Diagnosis not present

## 2023-05-27 DIAGNOSIS — L989 Disorder of the skin and subcutaneous tissue, unspecified: Secondary | ICD-10-CM | POA: Diagnosis not present

## 2023-06-10 DIAGNOSIS — R799 Abnormal finding of blood chemistry, unspecified: Secondary | ICD-10-CM | POA: Diagnosis not present

## 2023-06-10 LAB — CBC AND DIFFERENTIAL
HCT: 42 (ref 36–46)
Hemoglobin: 13.9 (ref 12.0–16.0)
Platelets: 363 10*3/uL (ref 150–400)
WBC: 8.4

## 2023-06-10 LAB — CBC: RBC: 4.54 (ref 3.87–5.11)

## 2023-06-16 DIAGNOSIS — M6281 Muscle weakness (generalized): Secondary | ICD-10-CM | POA: Diagnosis not present

## 2023-06-16 DIAGNOSIS — R278 Other lack of coordination: Secondary | ICD-10-CM | POA: Diagnosis not present

## 2023-06-17 DIAGNOSIS — R278 Other lack of coordination: Secondary | ICD-10-CM | POA: Diagnosis not present

## 2023-06-17 DIAGNOSIS — M6281 Muscle weakness (generalized): Secondary | ICD-10-CM | POA: Diagnosis not present

## 2023-06-19 ENCOUNTER — Non-Acute Institutional Stay (SKILLED_NURSING_FACILITY): Payer: PPO | Admitting: Internal Medicine

## 2023-06-19 ENCOUNTER — Encounter: Payer: Self-pay | Admitting: Internal Medicine

## 2023-06-19 DIAGNOSIS — F0280 Dementia in other diseases classified elsewhere without behavioral disturbance: Secondary | ICD-10-CM | POA: Diagnosis not present

## 2023-06-19 DIAGNOSIS — N3946 Mixed incontinence: Secondary | ICD-10-CM | POA: Diagnosis not present

## 2023-06-19 DIAGNOSIS — M25551 Pain in right hip: Secondary | ICD-10-CM | POA: Diagnosis not present

## 2023-06-19 DIAGNOSIS — G301 Alzheimer's disease with late onset: Secondary | ICD-10-CM | POA: Diagnosis not present

## 2023-06-19 DIAGNOSIS — E890 Postprocedural hypothyroidism: Secondary | ICD-10-CM | POA: Diagnosis not present

## 2023-06-19 DIAGNOSIS — I10 Essential (primary) hypertension: Secondary | ICD-10-CM | POA: Diagnosis not present

## 2023-06-19 DIAGNOSIS — M6281 Muscle weakness (generalized): Secondary | ICD-10-CM | POA: Diagnosis not present

## 2023-06-19 DIAGNOSIS — R278 Other lack of coordination: Secondary | ICD-10-CM | POA: Diagnosis not present

## 2023-06-19 NOTE — Progress Notes (Signed)
Location:  [facility] Room Number: N25A Place of Service:  SNF [phone]) Provider:  [name], MD  Patient Care Team: [name], MD as PCP - General (Internal Medicine) [name], MD as Consulting Physician (Endocrinology)  Extended Emergency Contact Information Primary Emergency Contact: Krahn,Gordon Address: [address]          [place], [place] [address] Darden Amber of [place] Home Phone: [phone] Mobile Phone: [phone] Relation: Spouse Secondary Emergency Contact: [name],[name] Address: [address]          [place], [place] [address] Darden Amber of [place] Home Phone: [phone] Work Phone: [phone] Mobile Phone: [phone] Relation: Son  Code Status:  DNR Goals of care: Advanced Directive information    06/19/2023    2:30 PM  Advanced Directives  Does Patient Have a Medical Advance Directive? Yes  Type of Estate agent of [place];Out of facility DNR (pink MOST or yellow form);Living will  Does patient want to make changes to medical advance directive? No - Patient declined  Copy of Healthcare Power of Attorney in Chart? Yes - validated most recent copy scanned in chart (See row information)     Chief Complaint  Patient presents with   Medical Management of Chronic Issues    Medical Management of Chronic Issues.     HPI:  Pt is a 89 y.o. female seen today for medical management of chronic diseases.    Lives in SNF in [facility]   Patient has h/o Hypertension, Hypothyroidism, Insomnia and Cognitive impairment. Also Has h/o Skin Cancer and Thyroid Cancer  Urinary Incontinence Failed Pessary Right hip Pain Has severe arthritis  Stable in SNF Can do some of her transfers Need help with ADLS Husband worried she is gaining some weight Wt Readings from Last 3 Encounters:  06/19/23 174 lb 14.4 oz (79.3 kg)  05/21/23 176 lb 8 oz (80.1 kg)  04/30/23 175 lb 4.8 oz (79.5 kg)    No  Behavior issues Uses Wheelchair mostly Cognition stable No Behaviors   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   Hypertension    Insomnia    Knee pain, bilateral 2014   Lymphedema    Left arm following mastectomy   Memory impairment    Memory loss    Migraine 05/16/2012   Migraine without aura, without mention of intractable migraine without mention of status migrainosus    patient denies at preop of 08/08/16    Other and unspecified hyperlipidemia    Other bursitis disorders    Other infective bursitis, left shoulder    Pain in joint, ankle and foot    Pain in joint, hand    Pain in joint, lower leg    Pain in joint, shoulder region    Pain in joint, shoulder region 04/23/2016   Pain in limb    Rectal bleeding 08/20/2016   Senile osteoporosis    Symptomatic menopausal or female climacteric states    Syncope and collapse    Unspecified hypertensive heart disease without heart failure 1995   Unspecified hypothyroidism  Unspecified polyarthropathy or polyarthritis, site unspecified    Unspecified urinary incontinence    Unspecified visual loss    Xerophthalmia    Past Surgical History:  Procedure Laterality Date   ABDOMINAL HYSTERECTOMY     BILATERAL OOPHORECTOMY  1981   BREAST RECONSTRUCTION Left 01/2000   BREAST REDUCTION SURGERY Right 01/2000   CATARACT EXTRACTION Right 09/04/2005   COLONOSCOPY  01/2004   MASTECTOMY Left 10/1999   THYROID LOBECTOMY Left 08/14/2016   Procedure: NEAR TOTAL THYROIDECTOMY;  Surgeon: Luretha Murphy, MD;  Location: [facility];  Service: General;  Laterality: Left;   TOTAL ABDOMINAL HYSTERECTOMY W/ BILATERAL SALPINGOOPHORECTOMY  1981   TOTAL  KNEE ARTHROPLASTY Right 2014    Allergies  Allergen Reactions   Donepezil Hcl     Body pain and stiffness    Namenda [Memantine Hcl]      Caused body pain    Sulfa Antibiotics    Sulfamethoxazole Rash    Outpatient Encounter Medications as of 06/19/2023  Medication Sig   acetaminophen (TYLENOL) 325 MG tablet Take 650 mg by mouth 3 (three) times daily.   amLODipine (NORVASC) 10 MG tablet Take 1 tablet (10 mg total) by mouth daily.   Cholecalciferol 5000 units capsule Take 5,000 Units by mouth daily.   diclofenac Sodium (VOLTAREN) 1 % GEL Apply 2 g topically 2 (two) times a week. Apply to right hip/thigh one time daily for pain after shower on Tuesday and Friday.   hydrocortisone (ANUSOL-HC) 2.5 % rectal cream Place 1 Application rectally 2 (two) times daily.   levothyroxine (SYNTHROID) 112 MCG tablet Take 112 mcg by mouth daily before breakfast.   lidocaine (HM LIDOCAINE PATCH) 4 % Place 2 patches onto the skin daily. Apply to lower back topically one time a day.   losartan (COZAAR) 100 MG tablet Take 100 mg by mouth daily.   melatonin 5 MG TABS Take 1 tablet (5 mg total) by mouth at bedtime.   Menthol, Topical Analgesic, (BIOFREEZE) 4 % GEL Apply 1 Application topically as needed. Apply to lower legs topically as needed for pain.   Multiple Vitamins-Minerals (THEREMS-M) TABS Take 1 tablet by mouth every morning.   Omega-3 Fatty Acids (OMEGA-3 CF PO) Take 1,000 mg by mouth every morning. With food   Ostomy Supplies (SKIN PREP WIPES) MISC 2 Applications by Does not apply route 2 (two) times daily. Apply to heels topically for skin care treatment.   oxybutynin (DITROPAN XL) 15 MG 24 hr tablet Take 15 mg by mouth at bedtime.   Polyethyl Glycol-Propyl Glycol 0.4-0.3 % SOLN Place 2 drops into both eyes daily as needed (dry eyes).    Psyllium (REGULOID) 48.57 % POWD Take by mouth daily. 1tsp   senna-docusate (SENNA S) 8.6-50 MG tablet Take 2 tablets by mouth daily.   triamcinolone (KENALOG)  0.025 % cream Apply 1 application  topically as needed (Apply to affected area topically).   witch hazel-glycerin (TUCKS) pad Apply 1 Application topically at bedtime. Change if soilded   zinc oxide 20 % ointment Apply 1 application  topically as needed for irritation. Apply to buttocks topically after every incontinent episode for redness   [DISCONTINUED] carbamide peroxide (DEBROX) 6.5 % OTIC solution Place 5 drops into the left ear at bedtime.   No facility-administered encounter medications on file as of 06/19/2023.    Review of Systems  Constitutional:  Negative for activity change and appetite change.  HENT: Negative.    Respiratory:  Negative for cough and shortness of breath.  Cardiovascular:  Negative for leg swelling.  Gastrointestinal:  Negative for constipation.  Genitourinary: Negative.   Musculoskeletal:  Positive for arthralgias and gait problem. Negative for myalgias.  Skin: Negative.   Neurological:  Negative for dizziness and weakness.  Psychiatric/Behavioral:  Positive for confusion. Negative for dysphoric mood and sleep disturbance.     Immunization History  Administered Date(s) Administered   Fluad Quad(high Dose 65+) 10/02/2022   Influenza, High Dose Seasonal PF 09/17/2017, 09/10/2018, 09/22/2019, 09/13/2021   Influenza, Quadrivalent, Recombinant, Inj, Pf 08/29/2020   Influenza,inj,Quad PF,6+ Mos 09/10/2018   Influenza-Unspecified 08/10/2013, 09/07/2015, 09/13/2016   Moderna Covid-19 Vaccine Bivalent Booster 32yrs & up 05/22/2022   Moderna SARS-COV2 Booster Vaccination 05/16/2021   Moderna Sars-Covid-2 Vaccination 12/13/2019, 01/10/2020, 10/23/2020   PFIZER(Purple Top)SARS-COV-2 Vaccination 08/29/2021   Pfizer Covid-19 Vaccine Bivalent Booster 31yrs & up 10/15/2022   Pneumococcal Conjugate-13 06/08/2017, 11/02/2018   Pneumococcal Polysaccharide-23 12/09/2002, 06/08/2017, 10/22/2017, 11/02/2018   Pneumococcal-Unspecified 06/08/2017, 11/02/2018   Tdap  02/21/2014, 10/29/2017   Zoster Recombinant(Shingrix) 07/18/2017, 01/10/2018   Zoster, Live 07/18/2017, 01/10/2018   Pertinent  Health Maintenance Due  Topic Date Due   INFLUENZA VACCINE  07/10/2023   DEXA SCAN  Completed      02/18/2022    2:20 PM 11/18/2022    2:34 PM 12/16/2022   10:20 AM 02/21/2023    2:43 PM 03/18/2023    2:13 PM  Fall Risk  Falls in the past year?  0 0 1 0  Was there an injury with Fall? 0 0 0 0 0  Fall Risk Category Calculator  0 0 2 0  Fall Risk Category (Retired)  Low Low    (RETIRED) Patient Fall Risk Level Moderate fall risk Moderate fall risk Moderate fall risk    Patient at Risk for Falls Due to History of fall(s);Impaired balance/gait;Impaired mobility History of fall(s) History of fall(s) History of fall(s);Impaired balance/gait History of fall(s);Impaired balance/gait  Patient at Risk for Falls Due to - Comments    H/o dementia   Fall risk Follow up Falls evaluation completed;Education provided;Falls prevention discussed Falls evaluation completed Falls evaluation completed Falls evaluation completed;Education provided;Falls prevention discussed Falls evaluation completed   Functional Status Survey:    Vitals:   06/19/23 1421 06/19/23 1423  BP: (!) 157/81 (!) 146/77  Pulse: 78   Resp: 18   Temp: 98.1 F (36.7 C)   SpO2: 98%   Weight: 174 lb 14.4 oz (79.3 kg)   Height: 5\' 4"  (1.626 m)    Body mass index is 30.02 kg/m. Physical Exam Vitals reviewed.  Constitutional:      Appearance: Normal appearance.  HENT:     Head: Normocephalic.     Nose: Nose normal.     Mouth/Throat:     Mouth: Mucous membranes are moist.     Pharynx: Oropharynx is clear.  Eyes:     Pupils: Pupils are equal, round, and reactive to light.  Cardiovascular:     Rate and Rhythm: Normal rate and regular rhythm.     Pulses: Normal pulses.     Heart sounds: Normal heart sounds. No murmur heard. Pulmonary:     Effort: Pulmonary effort is normal.     Breath sounds:  Normal breath sounds.  Abdominal:     General: Abdomen is flat. Bowel sounds are normal.     Palpations: Abdomen is soft.  Musculoskeletal:        General: No swelling.     Cervical back: Neck supple.  Skin:  General: Skin is warm.  Neurological:     General: No focal deficit present.     Mental Status: She is alert.  Psychiatric:        Mood and Affect: Mood normal.        Thought Content: Thought content normal.     Labs reviewed: Recent Labs    02/13/23 0000 02/20/23 0000 03/27/23 0000  NA 129* 131* 133*  K 4.3 4.5 4.5  CL 94* 98* 100  CO2 28* 26* 27*  BUN 18 18 18   CREATININE 0.7 0.7 0.7  CALCIUM 9.6 9.2 9.5   Recent Labs    07/26/22 0000  AST 15  ALT 12  ALKPHOS 62  ALBUMIN 3.9   Recent Labs    07/26/22 0000  WBC 6.0  NEUTROABS 3,372.00  HGB 12.8  HCT 37  PLT 364   Lab Results  Component Value Date   TSH 4.50 07/26/2022   No results found for: "HGBA1C" Lab Results  Component Value Date   CHOL 190 07/26/2020   HDL 56 07/26/2020   LDLCALC 109 (H) 07/26/2020   TRIG 139 07/26/2020   CHOLHDL 3.4 07/26/2020    Significant Diagnostic Results in last 30 days:  No results found.  Assessment/Plan 1. Primary hypertension Her Norvasc was increased to 10 mg recently BP better since then She is already on Cozaar Was taken off hydrochlorothiazide due to Hyponatremia  2. Late onset Alzheimer's 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 (HCC) Has failed Aricept and Namenda Doing well iN SNF  3. Right hip pain No More work up Tylenol  4. Mixed stress and urge urinary incontinence High dose of Ditropan per urology as she failed Pessary Seems to be doing well  5. Postoperative hypothyroidism TSH normal in 8/23    Family/ staff Communication:   Labs/tests ordered:

## 2023-06-24 DIAGNOSIS — R278 Other lack of coordination: Secondary | ICD-10-CM | POA: Diagnosis not present

## 2023-06-24 DIAGNOSIS — M6281 Muscle weakness (generalized): Secondary | ICD-10-CM | POA: Diagnosis not present

## 2023-06-26 DIAGNOSIS — M6281 Muscle weakness (generalized): Secondary | ICD-10-CM | POA: Diagnosis not present

## 2023-06-26 DIAGNOSIS — R278 Other lack of coordination: Secondary | ICD-10-CM | POA: Diagnosis not present

## 2023-07-01 DIAGNOSIS — R278 Other lack of coordination: Secondary | ICD-10-CM | POA: Diagnosis not present

## 2023-07-01 DIAGNOSIS — M6281 Muscle weakness (generalized): Secondary | ICD-10-CM | POA: Diagnosis not present

## 2023-07-03 ENCOUNTER — Encounter: Payer: Self-pay | Admitting: Internal Medicine

## 2023-07-03 ENCOUNTER — Non-Acute Institutional Stay (SKILLED_NURSING_FACILITY): Payer: PPO | Admitting: Internal Medicine

## 2023-07-03 DIAGNOSIS — R058 Other specified cough: Secondary | ICD-10-CM | POA: Diagnosis not present

## 2023-07-03 DIAGNOSIS — F0280 Dementia in other diseases classified elsewhere without behavioral disturbance: Secondary | ICD-10-CM | POA: Diagnosis not present

## 2023-07-03 DIAGNOSIS — R278 Other lack of coordination: Secondary | ICD-10-CM | POA: Diagnosis not present

## 2023-07-03 DIAGNOSIS — G301 Alzheimer's disease with late onset: Secondary | ICD-10-CM | POA: Diagnosis not present

## 2023-07-03 DIAGNOSIS — M6281 Muscle weakness (generalized): Secondary | ICD-10-CM | POA: Diagnosis not present

## 2023-07-03 NOTE — Progress Notes (Signed)
Location:  [facility] Room Number: 25A Place of Service:  SNF [phone]) Provider:  [name], MD   [name], MD  Patient Care Team: [name], MD as PCP - General (Internal Medicine) [name], MD as Consulting Physician (Endocrinology)  Extended Emergency Contact Information Primary Emergency Contact: Rubin,Gordon Address: [address]          [place], [place] [address] Darden Amber of [place] Home Phone: [phone] Mobile Phone: [phone] Relation: Spouse Secondary Emergency Contact: [name],[name] Address: [address]          [place], [place] [address] Darden Amber of [place] Home Phone: [phone] Work Phone: [phone] Mobile Phone: [phone] Relation: Son  Code Status:  DNr Goals of care: Advanced Directive information    07/03/2023    3:14 PM  Advanced Directives  Does Patient Have a Medical Advance Directive? Yes  Type of Estate agent of [place];Out of facility DNR (pink MOST or yellow form);Living will  Does patient want to make changes to medical advance directive? No - Patient declined  Copy of Healthcare Power of Attorney in Chart? Yes - validated most recent copy scanned in chart (See row information)     Chief Complaint  Patient presents with   Acute Visit    Patient is being seen for acute cough    HPI:  Pt is a 89 y.o. female seen today for an acute visit for Cough   Lives in [facility] Has been having Productive cough for past 5 days No Chest pain or fever or SOB  Patient also has h/o Hypertension, Hypothyroidism, Insomnia and Cognitive impairment. 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   Hypertension    Insomnia    Knee pain, bilateral 2014   Lymphedema    Left arm following mastectomy   Memory impairment    Memory loss    Migraine 05/16/2012   Migraine without aura, without mention of intractable migraine without mention of status migrainosus    patient denies at preop of 08/08/16    Other and unspecified hyperlipidemia    Other bursitis disorders    Other infective bursitis, left shoulder    Pain in joint, ankle and foot    Pain in joint, hand    Pain in joint, lower leg    Pain in joint, shoulder region    Pain in joint, shoulder region 04/23/2016   Pain in limb    Rectal bleeding 08/20/2016   Senile osteoporosis    Symptomatic menopausal or female climacteric states    Syncope and collapse    Unspecified hypertensive heart disease without heart failure 1995   Unspecified hypothyroidism    Unspecified polyarthropathy or polyarthritis, site unspecified    Unspecified urinary incontinence    Unspecified visual loss    Xerophthalmia    Past Surgical History:  Procedure Laterality Date   ABDOMINAL HYSTERECTOMY     BILATERAL OOPHORECTOMY  1981  BREAST RECONSTRUCTION Left 01/2000   BREAST REDUCTION SURGERY Right 01/2000   CATARACT EXTRACTION Right 09/04/2005   COLONOSCOPY  01/2004   MASTECTOMY Left 10/1999   THYROID LOBECTOMY Left 08/14/2016   Procedure: NEAR TOTAL THYROIDECTOMY;  Surgeon: Luretha Murphy, MD;  Location: [facility];  Service: General;  Laterality: Left;   TOTAL ABDOMINAL HYSTERECTOMY W/ BILATERAL SALPINGOOPHORECTOMY  1981   TOTAL KNEE ARTHROPLASTY Right 2014    Allergies  Allergen Reactions   Donepezil Hcl     Body pain and stiffness    Namenda [Memantine Hcl]      Caused body pain    Sulfa Antibiotics    Sulfamethoxazole Rash    Outpatient Encounter Medications as of  07/03/2023  Medication Sig   acetaminophen (TYLENOL) 325 MG tablet Take 650 mg by mouth 3 (three) times daily.   amLODipine (NORVASC) 10 MG tablet Take 1 tablet (10 mg total) by mouth daily.   carbamide peroxide (DEBROX) 6.5 % OTIC solution 5 drops 2 (two) times daily.   Cholecalciferol 5000 units capsule Take 5,000 Units by mouth daily.   diclofenac Sodium (VOLTAREN) 1 % GEL Apply 2 g topically 2 (two) times a week. Apply to right hip/thigh one time daily for pain after shower on Tuesday and Friday.   levothyroxine (SYNTHROID) 112 MCG tablet Take 112 mcg by mouth daily before breakfast.   lidocaine (HM LIDOCAINE PATCH) 4 % Place 2 patches onto the skin daily. Apply to lower back topically one time a day.   losartan (COZAAR) 100 MG tablet Take 100 mg by mouth daily.   melatonin 5 MG TABS Take 1 tablet (5 mg total) by mouth at bedtime.   Menthol, Topical Analgesic, (BIOFREEZE) 4 % GEL Apply 1 Application topically as needed. Apply to lower legs topically as needed for pain.   Multiple Vitamins-Minerals (THEREMS-M) TABS Take 1 tablet by mouth every morning.   Omega-3 Fatty Acids (OMEGA-3 CF PO) Take 1,000 mg by mouth every morning. With food   Ostomy Supplies (SKIN PREP WIPES) MISC 2 Applications by Does not apply route 2 (two) times daily. Apply to heels topically for skin care treatment.   oxybutynin (DITROPAN XL) 15 MG 24 hr tablet Take 15 mg by mouth at bedtime.   Polyethyl Glycol-Propyl Glycol 0.4-0.3 % SOLN Place 2 drops into both eyes daily as needed (dry eyes).    Psyllium (REGULOID) 48.57 % POWD Take by mouth daily. 1tsp   senna-docusate (SENNA S) 8.6-50 MG tablet Take 2 tablets by mouth daily.   triamcinolone (KENALOG) 0.025 % cream Apply 1 application  topically as needed (Apply to affected area topically).   zinc oxide 20 % ointment Apply 1 application  topically as needed for irritation. Apply to buttocks topically after every incontinent episode for redness   witch hazel-glycerin  (TUCKS) pad Apply 1 Application topically at bedtime. Change if soilded   No facility-administered encounter medications on file as of 07/03/2023.    Review of Systems  Constitutional:  Negative for activity change and appetite change.  HENT: Negative.    Respiratory:  Positive for cough. Negative for shortness of breath.   Cardiovascular:  Negative for leg swelling.  Gastrointestinal:  Negative for constipation.  Genitourinary: Negative.   Musculoskeletal:  Negative for arthralgias, gait problem and myalgias.  Skin: Negative.   Neurological:  Negative for dizziness and weakness.  Psychiatric/Behavioral:  Negative for confusion, dysphoric mood and sleep disturbance.     Immunization History  Administered Date(s) Administered   Fluad Quad(high Dose  65+) 10/02/2022   Influenza, High Dose Seasonal PF 09/17/2017, 09/10/2018, 09/22/2019, 09/13/2021   Influenza, Quadrivalent, Recombinant, Inj, Pf 08/29/2020   Influenza,inj,Quad PF,6+ Mos 09/10/2018   Influenza-Unspecified 08/10/2013, 09/07/2015, 09/13/2016   Moderna Covid-19 Vaccine Bivalent Booster 53yrs & up 05/22/2022   Moderna SARS-COV2 Booster Vaccination 05/16/2021   Moderna Sars-Covid-2 Vaccination 12/13/2019, 01/10/2020, 10/23/2020   PFIZER(Purple Top)SARS-COV-2 Vaccination 08/29/2021   Pfizer Covid-19 Vaccine Bivalent Booster 45yrs & up 10/15/2022   Pneumococcal Conjugate-13 06/08/2017, 11/02/2018   Pneumococcal Polysaccharide-23 12/09/2002, 06/08/2017, 10/22/2017, 11/02/2018   Pneumococcal-Unspecified 06/08/2017, 11/02/2018   Tdap 02/21/2014, 10/29/2017   Zoster Recombinant(Shingrix) 07/18/2017, 01/10/2018   Zoster, Live 07/18/2017, 01/10/2018   Pertinent  Health Maintenance Due  Topic Date Due   INFLUENZA VACCINE  07/10/2023   DEXA SCAN  Completed      02/18/2022    2:20 PM 11/18/2022    2:34 PM 12/16/2022   10:20 AM 02/21/2023    2:43 PM 03/18/2023    2:13 PM  Fall Risk  Falls in the past year?  0 0 1 0  Was there an  injury with Fall? 0 0 0 0 0  Fall Risk Category Calculator  0 0 2 0  Fall Risk Category (Retired)  Low Low    (RETIRED) Patient Fall Risk Level Moderate fall risk Moderate fall risk Moderate fall risk    Patient at Risk for Falls Due to History of fall(s);Impaired balance/gait;Impaired mobility History of fall(s) History of fall(s) History of fall(s);Impaired balance/gait History of fall(s);Impaired balance/gait  Patient at Risk for Falls Due to - Comments    H/o dementia   Fall risk Follow up Falls evaluation completed;Education provided;Falls prevention discussed Falls evaluation completed Falls evaluation completed Falls evaluation completed;Education provided;Falls prevention discussed Falls evaluation completed   Functional Status Survey:    Vitals:   07/03/23 1507  BP: 128/78  Pulse: 64  Resp: 20  Temp: (!) 96.8 F (36 C)  TempSrc: Temporal  SpO2: 97%  Weight: 174 lb 14.4 oz (79.3 kg)  Height: 5\' 4"  (1.626 m)   Body mass index is 30.02 kg/m. Physical Exam Vitals reviewed.  Constitutional:      Appearance: Normal appearance.  HENT:     Head: Normocephalic.     Nose: Nose normal.     Mouth/Throat:     Mouth: Mucous membranes are moist.     Pharynx: Oropharynx is clear.  Eyes:     Pupils: Pupils are equal, round, and reactive to light.  Cardiovascular:     Rate and Rhythm: Normal rate and regular rhythm.     Pulses: Normal pulses.     Heart sounds: Normal heart sounds. No murmur heard. Pulmonary:     Effort: Pulmonary effort is normal.     Breath sounds: Normal breath sounds. No wheezing or rales.  Abdominal:     General: Abdomen is flat. Bowel sounds are normal.     Palpations: Abdomen is soft.  Musculoskeletal:        General: No swelling.     Cervical back: Neck supple.  Skin:    General: Skin is warm.  Neurological:     General: No focal deficit present.     Mental Status: She is alert.  Psychiatric:        Mood and Affect: Mood normal.        Thought  Content: Thought content normal.     Labs reviewed: Recent Labs    02/13/23 0000 02/20/23 0000 03/27/23 0000  NA 129* 131*  133*  K 4.3 4.5 4.5  CL 94* 98* 100  CO2 28* 26* 27*  BUN 18 18 18   CREATININE 0.7 0.7 0.7  CALCIUM 9.6 9.2 9.5   Recent Labs    07/26/22 0000  AST 15  ALT 12  ALKPHOS 62  ALBUMIN 3.9   Recent Labs    07/26/22 0000  WBC 6.0  NEUTROABS 3,372.00  HGB 12.8  HCT 37  PLT 364   Lab Results  Component Value Date   TSH 4.50 07/26/2022   No results found for: "HGBA1C" Lab Results  Component Value Date   CHOL 190 07/26/2020   HDL 56 07/26/2020   LDLCALC 109 (H) 07/26/2020   TRIG 139 07/26/2020   CHOLHDL 3.4 07/26/2020    Significant Diagnostic Results in last 30 days:  No results found.  Assessment/Plan 1. Productive cough Check Chest Xray Covid checked was negative Addendum Xray was negative for any acute changes Will try Mucinex for few days  2. Late onset Alzheimer's 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 (HCC) Supportive care in SNF    Family/ staff Communication: Husband  Labs/tests ordered:  Chest Xray and Covid

## 2023-07-08 DIAGNOSIS — R278 Other lack of coordination: Secondary | ICD-10-CM | POA: Diagnosis not present

## 2023-07-08 DIAGNOSIS — M6281 Muscle weakness (generalized): Secondary | ICD-10-CM | POA: Diagnosis not present

## 2023-07-10 DIAGNOSIS — R278 Other lack of coordination: Secondary | ICD-10-CM | POA: Diagnosis not present

## 2023-07-10 DIAGNOSIS — M6281 Muscle weakness (generalized): Secondary | ICD-10-CM | POA: Diagnosis not present

## 2023-07-18 ENCOUNTER — Non-Acute Institutional Stay (SKILLED_NURSING_FACILITY): Payer: PPO | Admitting: Orthopedic Surgery

## 2023-07-18 ENCOUNTER — Encounter: Payer: Self-pay | Admitting: Orthopedic Surgery

## 2023-07-18 DIAGNOSIS — M810 Age-related osteoporosis without current pathological fracture: Secondary | ICD-10-CM

## 2023-07-18 DIAGNOSIS — H6123 Impacted cerumen, bilateral: Secondary | ICD-10-CM

## 2023-07-18 DIAGNOSIS — K644 Residual hemorrhoidal skin tags: Secondary | ICD-10-CM

## 2023-07-18 DIAGNOSIS — N3946 Mixed incontinence: Secondary | ICD-10-CM | POA: Diagnosis not present

## 2023-07-18 DIAGNOSIS — E890 Postprocedural hypothyroidism: Secondary | ICD-10-CM | POA: Diagnosis not present

## 2023-07-18 DIAGNOSIS — M545 Low back pain, unspecified: Secondary | ICD-10-CM | POA: Diagnosis not present

## 2023-07-18 DIAGNOSIS — F0390 Unspecified dementia without behavioral disturbance: Secondary | ICD-10-CM

## 2023-07-18 DIAGNOSIS — G8929 Other chronic pain: Secondary | ICD-10-CM

## 2023-07-18 DIAGNOSIS — I10 Essential (primary) hypertension: Secondary | ICD-10-CM | POA: Diagnosis not present

## 2023-07-18 NOTE — Progress Notes (Signed)
Location:   [facility]  Nursing Home Room Number: 25-A Place of Service:  SNF [phone]) Provider:  Hazle Nordmann, NP  PCP: [name], MD  Patient Care Team: [name], MD as PCP - General (Internal Medicine) [name], MD as Consulting Physician (Endocrinology)  Extended Emergency Contact Information Primary Emergency Contact: Iribe,Gordon Address: [address], [address]          [place], [place] [address] Darden Amber of [place] Home Phone: [phone] Mobile Phone: [phone] Relation: Spouse Secondary Emergency Contact: [name],[name] Address: [address]          [place], [place] [address] Darden Amber of [place] Home Phone: [phone] Work Phone: [phone] Mobile Phone: [phone] Relation: Son  Code Status:  DNR Goals of care: Advanced Directive information    07/18/2023    9:30 AM  Advanced Directives  Does Patient Have a Medical Advance Directive? Yes  Type of Estate agent of [place];Living will;Out of facility DNR (pink MOST or yellow form)  Does patient want to make changes to medical advance directive? No - Patient declined  Copy of Healthcare Power of Attorney in Chart? Yes - validated most recent copy scanned in chart (See row information)     Chief Complaint  Patient presents with   Medical Management of Chronic Issues    Routine Visit.    Immunizations    Discuss the need for Influenza vaccine.     HPI:  Pt is a 89 y.o. female seen today for medical management of chronic diseases.    She currently resides on the skilled nursing unit at [facility]. PMH: HTN, migraines, rectal bleeding, hypothyroidism, s/p thyroidectomy (left) 2017, dementia, osteoporosis, constipation, h/o breast and skin cancer, abnormal gait and insomnia.    HTN- BUN/creat 18/0.7 03/27/2023, remains on losartan Dementia- MMSE 23/30 02/2022, BIMS score 11/15 (06/10)> was 10/15 (02/2023), no behavioral outbursts, dependent  with ADLs except feeding,  unable to tolerate Namenda/Aricept in past/ rash with Exelon patch, not on medication Chronic right sided pain- past xrays suggest 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, remains on tylenol, voltaren gel and lidocaine patches Senile osteoporosis- DEXA 09/2021, remains on vitamin D Urinary incontinence- stable with oxybutynin Hypothyroidism- s/p left lobe thyroidectomy 2017, TSH 4.50 07/26/2022, remains on levothyroxine  External hemorrhoids- no recent bleeding, remains on senna  Recent weights:  08/01- 176.5 lbs  07/01- 174.9 lbs  06/01- 176.5 lbs  Recent blood pressures:  08/06- 136/76  07/30- 116/76  07/25- 128/78    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   Hypertension    Insomnia    Knee pain, bilateral 2014   Lymphedema    Left arm following mastectomy   Memory impairment    Memory loss    Migraine 05/16/2012   Migraine without aura, without mention of intractable migraine without mention of status migrainosus    patient denies at preop of 08/08/16    Other and unspecified hyperlipidemia    Other bursitis disorders    Other infective bursitis, left shoulder    Pain in joint, ankle and foot  Pain in joint, hand    Pain in joint, lower leg    Pain in joint, shoulder region    Pain in joint, shoulder region 04/23/2016   Pain in limb    Rectal bleeding 08/20/2016   Senile osteoporosis    Symptomatic menopausal or female climacteric states    Syncope and collapse    Unspecified hypertensive heart disease without heart failure 1995   Unspecified hypothyroidism    Unspecified polyarthropathy or polyarthritis, site unspecified    Unspecified  urinary incontinence    Unspecified visual loss    Xerophthalmia    Past Surgical History:  Procedure Laterality Date   ABDOMINAL HYSTERECTOMY     BILATERAL OOPHORECTOMY  1981   BREAST RECONSTRUCTION Left 01/2000   BREAST REDUCTION SURGERY Right 01/2000   CATARACT EXTRACTION Right 09/04/2005   COLONOSCOPY  01/2004   MASTECTOMY Left 10/1999   THYROID LOBECTOMY Left 08/14/2016   Procedure: NEAR TOTAL THYROIDECTOMY;  Surgeon: Luretha Murphy, MD;  Location: [facility];  Service: General;  Laterality: Left;   TOTAL ABDOMINAL HYSTERECTOMY W/ BILATERAL SALPINGOOPHORECTOMY  1981   TOTAL KNEE ARTHROPLASTY Right 2014    Allergies  Allergen Reactions   Donepezil Hcl     Body pain and stiffness    Namenda [Memantine Hcl]      Caused body pain    Sulfa Antibiotics    Sulfamethoxazole Rash    Allergies as of 07/18/2023       Reactions   Donepezil Hcl    Body pain and stiffness   Namenda [memantine Hcl]     Caused body pain    Sulfa Antibiotics    Sulfamethoxazole Rash        Medication List        Accurate as of July 18, 2023  9:30 AM. If you have any questions, ask your nurse or doctor.          STOP taking these medications    carbamide peroxide 6.5 % OTIC solution Commonly known as: DEBROX Stopped by: Octavia Heir   witch hazel-glycerin pad Commonly known as: TUCKS Stopped by: Octavia Heir       TAKE these medications    acetaminophen 325 MG tablet Commonly known as: TYLENOL Take 650 mg by mouth 3 (three) times daily.   amLODipine 10 MG tablet Commonly known as: NORVASC Take 1 tablet (10 mg total) by mouth daily.   Biofreeze 4 % Gel Generic drug: Menthol (Topical Analgesic) Apply 1 Application topically as needed. Apply to lower legs topically as needed for pain.   Cholecalciferol 125 MCG (5000 UT) capsule Take 5,000 Units by mouth daily.   diclofenac Sodium 1 % Gel Commonly known as: VOLTAREN Apply 2 g topically 2 (two) times a week. Apply to right  hip/thigh one time daily for pain after shower on Tuesday and Friday.   HM Lidocaine Patch 4 % Generic drug: lidocaine Place 2 patches onto the skin daily. Apply to lower back topically one time a day.   levothyroxine 112 MCG tablet Commonly known as: SYNTHROID Take 112 mcg by mouth daily before breakfast.   losartan 100 MG tablet Commonly known as: COZAAR Take 100 mg by mouth daily.   melatonin 5 MG Tabs Take 1 tablet (5 mg total) by mouth at bedtime.   OMEGA-3 CF PO Take 1,000 mg by mouth every morning. With food   oxybutynin 15 MG 24 hr tablet Commonly known as: DITROPAN XL Take 15 mg by mouth at bedtime.  Polyethyl Glycol-Propyl Glycol 0.4-0.3 % Soln Place 2 drops into both eyes daily as needed (dry eyes).   Reguloid 48.57 % Powd Generic drug: Psyllium Take by mouth daily. 1tsp   Senna S 8.6-50 MG tablet Generic drug: senna-docusate Take 2 tablets by mouth daily.   Skin Prep Wipes Misc 2 Applications by Does not apply route 2 (two) times daily. Apply to heels topically for skin care treatment.   Therems-M Tabs Take 1 tablet by mouth every morning.   triamcinolone 0.025 % cream Commonly known as: KENALOG Apply 1 application  topically as needed (Apply to affected area topically).   zinc oxide 20 % ointment Apply 1 application  topically as needed for irritation. Apply to buttocks topically after every incontinent episode for redness        Review of Systems  Unable to perform ROS: Dementia    Immunization History  Administered Date(s) Administered   Fluad Quad(high Dose 65+) 10/02/2022   Influenza, High Dose Seasonal PF 09/17/2017, 09/10/2018, 09/22/2019, 09/13/2021   Influenza, Quadrivalent, Recombinant, Inj, Pf 08/29/2020   Influenza,inj,Quad PF,6+ Mos 09/10/2018   Influenza-Unspecified 08/10/2013, 09/07/2015, 09/13/2016   Moderna Covid-19 Vaccine Bivalent Booster 19yrs & up 05/22/2022   Moderna SARS-COV2 Booster Vaccination 05/16/2021   Moderna  Sars-Covid-2 Vaccination 12/13/2019, 01/10/2020, 10/23/2020   PFIZER(Purple Top)SARS-COV-2 Vaccination 08/29/2021   Pfizer Covid-19 Vaccine Bivalent Booster 29yrs & up 10/15/2022   Pneumococcal Conjugate-13 06/08/2017, 11/02/2018   Pneumococcal Polysaccharide-23 12/09/2002, 06/08/2017, 10/22/2017, 11/02/2018   Pneumococcal-Unspecified 06/08/2017, 11/02/2018   Tdap 02/21/2014, 10/29/2017   Zoster Recombinant(Shingrix) 07/18/2017, 01/10/2018   Zoster, Live 07/18/2017, 01/10/2018   Pertinent  Health Maintenance Due  Topic Date Due   INFLUENZA VACCINE  07/10/2023   DEXA SCAN  Completed      02/18/2022    2:20 PM 11/18/2022    2:34 PM 12/16/2022   10:20 AM 02/21/2023    2:43 PM 03/18/2023    2:13 PM  Fall Risk  Falls in the past year?  0 0 1 0  Was there an injury with Fall? 0 0 0 0 0  Fall Risk Category Calculator  0 0 2 0  Fall Risk Category (Retired)  Low Low    (RETIRED) Patient Fall Risk Level Moderate fall risk Moderate fall risk Moderate fall risk    Patient at Risk for Falls Due to History of fall(s);Impaired balance/gait;Impaired mobility History of fall(s) History of fall(s) History of fall(s);Impaired balance/gait History of fall(s);Impaired balance/gait  Patient at Risk for Falls Due to - Comments    H/o dementia   Fall risk Follow up Falls evaluation completed;Education provided;Falls prevention discussed Falls evaluation completed Falls evaluation completed Falls evaluation completed;Education provided;Falls prevention discussed Falls evaluation completed   Functional Status Survey:    Vitals:   07/18/23 0926  BP: 136/76  Pulse: 68  Resp: 19  Temp: 97.7 F (36.5 C)  SpO2: 96%  Weight: 176 lb 8 oz (80.1 kg)  Height: 5\' 4"  (1.626 m)   Body mass index is 30.3 kg/m. Physical Exam Vitals reviewed.  Constitutional:      General: She is not in acute distress. HENT:     Head: Normocephalic.     Right Ear: There is impacted cerumen.     Left Ear: There is impacted  cerumen.     Nose: Nose normal.     Mouth/Throat:     Mouth: Mucous membranes are moist.  Eyes:     General:        Right eye: No  discharge.        Left eye: No discharge.  Cardiovascular:     Rate and Rhythm: Normal rate and regular rhythm.     Pulses: Normal pulses.     Heart sounds: Normal heart sounds.  Pulmonary:     Effort: Pulmonary effort is normal. No respiratory distress.     Breath sounds: Normal breath sounds. No wheezing.  Abdominal:     General: Bowel sounds are normal.     Palpations: Abdomen is soft.  Musculoskeletal:     Cervical back: Neck supple.     Right lower leg: No edema.     Left lower leg: No edema.  Skin:    General: Skin is warm.     Capillary Refill: Capillary refill takes less than 2 seconds.  Neurological:     General: No focal deficit present.     Mental Status: She is alert. Mental status is at baseline.     Motor: Weakness present.     Gait: Gait abnormal.  Psychiatric:        Mood and Affect: Mood normal.     Labs reviewed: Recent Labs    02/13/23 0000 02/20/23 0000 03/27/23 0000  NA 129* 131* 133*  K 4.3 4.5 4.5  CL 94* 98* 100  CO2 28* 26* 27*  BUN 18 18 18   CREATININE 0.7 0.7 0.7  CALCIUM 9.6 9.2 9.5   Recent Labs    07/26/22 0000  AST 15  ALT 12  ALKPHOS 62  ALBUMIN 3.9   Recent Labs    07/26/22 0000 06/10/23 0000  WBC 6.0 8.4  NEUTROABS 3,372.00  --   HGB 12.8 13.9  HCT 37 42  PLT 364 363   Lab Results  Component Value Date   TSH 4.50 07/26/2022   No results found for: "HGBA1C" Lab Results  Component Value Date   CHOL 190 07/26/2020   HDL 56 07/26/2020   LDLCALC 109 (H) 07/26/2020   TRIG 139 07/26/2020   CHOLHDL 3.4 07/26/2020    Significant Diagnostic Results in last 30 days:  No results found.  Assessment/Plan 1. Bilateral impacted cerumen - start debrox 5gtts to both ears at bedtime x 5 days - flush ears when debrox complete  2. Primary hypertension - controlled with amlodipine and  losartan  3. Dementia without behavioral disturbance (HCC) - no behaviors - dependent with ADLs except feeding - not on medication - cont skilled nursing  4. Chronic right-sided low back pain without sciatica - ongoing - past xray severe arthritis right hip - cont tylenol, voltaren gel and lidocaine patches  5. Senile osteoporosis - DEXA 2022 - cont vitamin D  6. Mixed stress and urge urinary incontinence - cont oxybutynin  7. Postoperative hypothyroidism - cont levothyroxine  8. External hemorrhoid, bleeding - cont senna    Family/ staff Communication: plan discussed with patient and nurse  Labs/tests ordered:  none

## 2023-07-20 NOTE — Progress Notes (Signed)
Pharmacy Quality Measure Review  This patient is appearing on a report for being at risk of failing the adherence measure for hypertension (ACEi/ARB) medications this calendar year.   Medication: losartan 100 mg  Last fill date: 05/15/23 for 30 day supply  Pt residing in SNF for dementia and chronic disease management. No action needed at this time.   Nils Pyle, PharmD PGY1 Pharmacy Resident

## 2023-08-07 DIAGNOSIS — L570 Actinic keratosis: Secondary | ICD-10-CM | POA: Diagnosis not present

## 2023-08-07 DIAGNOSIS — L814 Other melanin hyperpigmentation: Secondary | ICD-10-CM | POA: Diagnosis not present

## 2023-08-07 DIAGNOSIS — L821 Other seborrheic keratosis: Secondary | ICD-10-CM | POA: Diagnosis not present

## 2023-08-14 DIAGNOSIS — Z96651 Presence of right artificial knee joint: Secondary | ICD-10-CM | POA: Diagnosis not present

## 2023-08-14 DIAGNOSIS — Z9181 History of falling: Secondary | ICD-10-CM | POA: Diagnosis not present

## 2023-08-14 DIAGNOSIS — G301 Alzheimer's disease with late onset: Secondary | ICD-10-CM | POA: Diagnosis not present

## 2023-08-14 DIAGNOSIS — R278 Other lack of coordination: Secondary | ICD-10-CM | POA: Diagnosis not present

## 2023-08-14 DIAGNOSIS — R2681 Unsteadiness on feet: Secondary | ICD-10-CM | POA: Diagnosis not present

## 2023-08-14 DIAGNOSIS — M6281 Muscle weakness (generalized): Secondary | ICD-10-CM | POA: Diagnosis not present

## 2023-08-15 DIAGNOSIS — Z96651 Presence of right artificial knee joint: Secondary | ICD-10-CM | POA: Diagnosis not present

## 2023-08-15 DIAGNOSIS — G301 Alzheimer's disease with late onset: Secondary | ICD-10-CM | POA: Diagnosis not present

## 2023-08-15 DIAGNOSIS — R2681 Unsteadiness on feet: Secondary | ICD-10-CM | POA: Diagnosis not present

## 2023-08-15 DIAGNOSIS — Z9181 History of falling: Secondary | ICD-10-CM | POA: Diagnosis not present

## 2023-08-15 DIAGNOSIS — M6281 Muscle weakness (generalized): Secondary | ICD-10-CM | POA: Diagnosis not present

## 2023-08-15 DIAGNOSIS — R278 Other lack of coordination: Secondary | ICD-10-CM | POA: Diagnosis not present

## 2023-08-18 DIAGNOSIS — M6281 Muscle weakness (generalized): Secondary | ICD-10-CM | POA: Diagnosis not present

## 2023-08-18 DIAGNOSIS — Z9181 History of falling: Secondary | ICD-10-CM | POA: Diagnosis not present

## 2023-08-18 DIAGNOSIS — R278 Other lack of coordination: Secondary | ICD-10-CM | POA: Diagnosis not present

## 2023-08-18 DIAGNOSIS — Z96651 Presence of right artificial knee joint: Secondary | ICD-10-CM | POA: Diagnosis not present

## 2023-08-18 DIAGNOSIS — R2681 Unsteadiness on feet: Secondary | ICD-10-CM | POA: Diagnosis not present

## 2023-08-18 DIAGNOSIS — G301 Alzheimer's disease with late onset: Secondary | ICD-10-CM | POA: Diagnosis not present

## 2023-08-20 DIAGNOSIS — M6281 Muscle weakness (generalized): Secondary | ICD-10-CM | POA: Diagnosis not present

## 2023-08-20 DIAGNOSIS — R2681 Unsteadiness on feet: Secondary | ICD-10-CM | POA: Diagnosis not present

## 2023-08-20 DIAGNOSIS — G301 Alzheimer's disease with late onset: Secondary | ICD-10-CM | POA: Diagnosis not present

## 2023-08-20 DIAGNOSIS — Z9181 History of falling: Secondary | ICD-10-CM | POA: Diagnosis not present

## 2023-08-20 DIAGNOSIS — Z96651 Presence of right artificial knee joint: Secondary | ICD-10-CM | POA: Diagnosis not present

## 2023-08-20 DIAGNOSIS — R278 Other lack of coordination: Secondary | ICD-10-CM | POA: Diagnosis not present

## 2023-08-22 ENCOUNTER — Non-Acute Institutional Stay (INDEPENDENT_AMBULATORY_CARE_PROVIDER_SITE_OTHER): Payer: PPO | Admitting: Orthopedic Surgery

## 2023-08-22 ENCOUNTER — Encounter: Payer: Self-pay | Admitting: Orthopedic Surgery

## 2023-08-22 DIAGNOSIS — E890 Postprocedural hypothyroidism: Secondary | ICD-10-CM

## 2023-08-22 DIAGNOSIS — G301 Alzheimer's disease with late onset: Secondary | ICD-10-CM | POA: Diagnosis not present

## 2023-08-22 DIAGNOSIS — M810 Age-related osteoporosis without current pathological fracture: Secondary | ICD-10-CM

## 2023-08-22 DIAGNOSIS — N3281 Overactive bladder: Secondary | ICD-10-CM

## 2023-08-22 DIAGNOSIS — R2681 Unsteadiness on feet: Secondary | ICD-10-CM | POA: Diagnosis not present

## 2023-08-22 DIAGNOSIS — Z9181 History of falling: Secondary | ICD-10-CM | POA: Diagnosis not present

## 2023-08-22 DIAGNOSIS — F0390 Unspecified dementia without behavioral disturbance: Secondary | ICD-10-CM | POA: Diagnosis not present

## 2023-08-22 DIAGNOSIS — E871 Hypo-osmolality and hyponatremia: Secondary | ICD-10-CM | POA: Diagnosis not present

## 2023-08-22 DIAGNOSIS — Z96651 Presence of right artificial knee joint: Secondary | ICD-10-CM | POA: Diagnosis not present

## 2023-08-22 DIAGNOSIS — M6281 Muscle weakness (generalized): Secondary | ICD-10-CM | POA: Diagnosis not present

## 2023-08-22 DIAGNOSIS — I10 Essential (primary) hypertension: Secondary | ICD-10-CM | POA: Diagnosis not present

## 2023-08-22 DIAGNOSIS — R278 Other lack of coordination: Secondary | ICD-10-CM | POA: Diagnosis not present

## 2023-08-22 NOTE — Progress Notes (Signed)
Careteam: Patient Care Team: [name], MD as PCP - General (Internal Medicine) [name], MD as Consulting Physician (Endocrinology)  Seen by: Hazle Nordmann, AGNP-C  PLACE OF SERVICE:  [facility]  Advanced Directive information    Allergies  Allergen Reactions   Donepezil Hcl     Body pain and stiffness    Namenda [Memantine Hcl]      Caused body pain    Sulfa Antibiotics    Sulfamethoxazole Rash    Chief Complaint  Patient presents with   Medical Management of Chronic Issues     HPI: Patient is a 89 y.o. female seen today for medical management of chronic conditions.   She currently resides on the skilled nursing unit at [facility]. PMH: HTN, migraines, rectal bleeding, hypothyroidism, s/p thyroidectomy (left) 2017, dementia, osteoporosis, constipation, h/o breast and skin cancer, abnormal gait and insomnia.   Unstable gait- PT started due to difficulty transferring, using sit/stand lift at this time  HTN- BUN/creat 18/0.7 03/27/2023, remains on losartan Dementia- MMSE 23/30 02/2022, BIMS score 12/15 (09/11)> was 11/15 (06/10)> was 10/15 (02/2023), no behavioral outbursts, dependent with ADLs except feeding,  unable to tolerate Namenda/Aricept in past/ rash with Exelon patch, not on medication Senile osteoporosis- DEXA 09/2021, remains on vitamin D Urinary incontinence/OAB- stable with oxybutynin Hypothyroidism- s/p left lobe thyroidectomy 2017, TSH 4.50 07/26/2022, remains on levothyroxine  Hyponatremia- Na+ 133 (04/18)> was 131 (03/14), not on medication  Recent blood pressures:  09/10- 142/86  09/03- 142/90  08/27- 116/76  Recent weights:  09/02- 177.1 lbs  08/01- 176.5 lbs  07/01- 174.9 lbs    Review of Systems:  Review of Systems  Reason unable to perform ROS: dementia/ aphasia.    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   Hypertension    Insomnia    Knee pain, bilateral 2014   Lymphedema    Left arm following mastectomy   Memory impairment    Memory loss    Migraine 05/16/2012   Migraine without aura, without mention of intractable migraine without mention of status migrainosus    patient denies at preop of 08/08/16    Other and unspecified hyperlipidemia    Other bursitis disorders    Other infective bursitis, left shoulder    Pain in joint, ankle and foot    Pain in joint, hand    Pain in joint, lower leg    Pain in joint, shoulder region    Pain in joint, shoulder region 04/23/2016   Pain in limb    Rectal bleeding 08/20/2016   Senile osteoporosis    Symptomatic menopausal or female climacteric states    Syncope and collapse    Unspecified hypertensive heart disease without heart failure 1995   Unspecified hypothyroidism    Unspecified polyarthropathy or polyarthritis, site unspecified    Unspecified urinary incontinence    Unspecified visual loss    Xerophthalmia    Past Surgical History:  Procedure Laterality Date   ABDOMINAL HYSTERECTOMY     BILATERAL OOPHORECTOMY  1981   BREAST RECONSTRUCTION Left 01/2000   BREAST REDUCTION  SURGERY Right 01/2000   CATARACT EXTRACTION Right 09/04/2005   COLONOSCOPY  01/2004   MASTECTOMY Left 10/1999   THYROID LOBECTOMY Left 08/14/2016   Procedure: NEAR TOTAL THYROIDECTOMY;  Surgeon: Luretha Murphy, MD;  Location: [facility];  Service: General;  Laterality: Left;   TOTAL ABDOMINAL HYSTERECTOMY W/ BILATERAL SALPINGOOPHORECTOMY  1981   TOTAL KNEE ARTHROPLASTY Right 2014   Social History:   reports that she has never smoked. She has never used smokeless tobacco. She reports that she does not drink alcohol and does not use drugs.  Family History  Problem Relation  Age of Onset   Heart disease Mother    Dementia Mother    Dementia Father    Cancer Daughter    Asthma Son     Medications: Patient's Medications  New Prescriptions   No medications on file  Previous Medications   ACETAMINOPHEN (TYLENOL) 325 MG TABLET    Take 650 mg by mouth 3 (three) times daily.   AMLODIPINE (NORVASC) 10 MG TABLET    Take 1 tablet (10 mg total) by mouth daily.   CHOLECALCIFEROL 5000 UNITS CAPSULE    Take 5,000 Units by mouth daily.   DICLOFENAC SODIUM (VOLTAREN) 1 % GEL    Apply 2 g topically 2 (two) times a week. Apply to right hip/thigh one time daily for pain after shower on Tuesday and Friday.   LEVOTHYROXINE (SYNTHROID) 112 MCG TABLET    Take 112 mcg by mouth daily before breakfast.   LIDOCAINE (HM LIDOCAINE PATCH) 4 %    Place 2 patches onto the skin daily. Apply to lower back topically one time a day.   LOSARTAN (COZAAR) 100 MG TABLET    Take 100 mg by mouth daily.   MELATONIN 5 MG TABS    Take 1 tablet (5 mg total) by mouth at bedtime.   MENTHOL, TOPICAL ANALGESIC, (BIOFREEZE) 4 % GEL    Apply 1 Application topically as needed. Apply to lower legs topically as needed for pain.   MULTIPLE VITAMINS-MINERALS (THEREMS-M) TABS    Take 1 tablet by mouth every morning.   OMEGA-3 FATTY ACIDS (OMEGA-3 CF PO)    Take 1,000 mg by mouth every morning. With food   OSTOMY SUPPLIES (SKIN PREP WIPES) MISC    2 Applications by Does not apply route 2 (two) times daily. Apply to heels topically for skin care treatment.   OXYBUTYNIN (DITROPAN XL) 15 MG 24 HR TABLET    Take 15 mg by mouth at bedtime.   POLYETHYL GLYCOL-PROPYL GLYCOL 0.4-0.3 % SOLN    Place 2 drops into both eyes daily as needed (dry eyes).    PSYLLIUM (REGULOID) 48.57 % POWD    Take by mouth daily. 1tsp   SENNA-DOCUSATE (SENNA S) 8.6-50 MG TABLET    Take 2 tablets by mouth daily.   TRIAMCINOLONE (KENALOG) 0.025 % CREAM    Apply 1 application  topically as needed (Apply to affected area topically).   ZINC OXIDE 20  % OINTMENT    Apply 1 application  topically as needed for irritation. Apply to buttocks topically after every incontinent episode for redness  Modified Medications   No medications on file  Discontinued Medications   No medications on file    Physical Exam:  Vitals:   08/22/23 1132  BP: (!) 142/86  Pulse: 67  Resp: 20  Temp: 97.7 F (36.5 C)  SpO2: 100%  Weight: 177 lb 1.6 oz (80.3 kg)  Height: 5\' 4"  (1.626 m)  Body mass index is 30.4 kg/m. Wt Readings from Last 3 Encounters:  08/22/23 177 lb 1.6 oz (80.3 kg)  07/18/23 176 lb 8 oz (80.1 kg)  07/03/23 174 lb 14.4 oz (79.3 kg)    Physical Exam Vitals reviewed.  Constitutional:      General: She is not in acute distress. HENT:     Head: Normocephalic.     Right Ear: There is no impacted cerumen.     Left Ear: There is no impacted cerumen.     Nose: Nose normal.     Mouth/Throat:     Mouth: Mucous membranes are moist.  Eyes:     General:        Right eye: No discharge.        Left eye: No discharge.  Cardiovascular:     Rate and Rhythm: Normal rate and regular rhythm.     Pulses: Normal pulses.     Heart sounds: Normal heart sounds.  Pulmonary:     Effort: Pulmonary effort is normal. No respiratory distress.     Breath sounds: Normal breath sounds. No wheezing.  Abdominal:     General: Bowel sounds are normal. There is no distension.     Palpations: Abdomen is soft.     Tenderness: There is no abdominal tenderness.  Musculoskeletal:     Cervical back: Neck supple.     Right lower leg: No edema.     Left lower leg: No edema.  Skin:    General: Skin is warm.     Capillary Refill: Capillary refill takes less than 2 seconds.     Comments: Keratosis right hand, approx 0.25 cm, CDI  Neurological:     General: No focal deficit present.     Mental Status: She is alert. Mental status is at baseline.     Motor: Weakness present.     Gait: Gait abnormal.     Comments: Sit/stand transfer  Psychiatric:         Mood and Affect: Mood normal.     Comments: Alert to self/familiar face, follows commands, aphasia     Labs reviewed: Basic Metabolic Panel: Recent Labs    02/13/23 0000 02/20/23 0000 03/27/23 0000  NA 129* 131* 133*  K 4.3 4.5 4.5  CL 94* 98* 100  CO2 28* 26* 27*  BUN 18 18 18   CREATININE 0.7 0.7 0.7  CALCIUM 9.6 9.2 9.5   Liver Function Tests: No results for input(s): "AST", "ALT", "ALKPHOS", "BILITOT", "PROT", "ALBUMIN" in the last 8760 hours. No results for input(s): "LIPASE", "AMYLASE" in the last 8760 hours. No results for input(s): "AMMONIA" in the last 8760 hours. CBC: Recent Labs    06/10/23 0000  WBC 8.4  HGB 13.9  HCT 42  PLT 363   Lipid Panel: No results for input(s): "CHOL", "HDL", "LDLCALC", "TRIG", "CHOLHDL", "LDLDIRECT" in the last 8760 hours. TSH: No results for input(s): "TSH" in the last 8760 hours. A1C: No results found for: "HGBA1C"   Assessment/Plan: 1. Unstable gait - enrolled with PT - sit/stand lift transfer - ambulating with w/c  2. Primary hypertension - controlled with losartan and amlodipine  3. Dementia without behavioral disturbance (HCC) - no behaviors - dependent with ADLs except feeding - recent issues with transfers - ambulates with w/c - cont skilled nursing care  4. Senile osteoporosis - cont Vitamin D  5. OAB (overactive bladder) - cont oxybutynin  6. Postoperative hypothyroidism - TSH stable - cont levothyroxine  7. Hyponatremia - Na+  133 - asymptomatic - not on medication  Future labs: cbc/diff, cmp, TSH 09/11/2023  Next appt: Visit date not found  Satya Bohall Scherry Ran  [facility] [phone]

## 2023-08-25 DIAGNOSIS — G301 Alzheimer's disease with late onset: Secondary | ICD-10-CM | POA: Diagnosis not present

## 2023-08-25 DIAGNOSIS — Z9181 History of falling: Secondary | ICD-10-CM | POA: Diagnosis not present

## 2023-08-25 DIAGNOSIS — Z96651 Presence of right artificial knee joint: Secondary | ICD-10-CM | POA: Diagnosis not present

## 2023-08-25 DIAGNOSIS — M6281 Muscle weakness (generalized): Secondary | ICD-10-CM | POA: Diagnosis not present

## 2023-08-25 DIAGNOSIS — R2681 Unsteadiness on feet: Secondary | ICD-10-CM | POA: Diagnosis not present

## 2023-08-25 DIAGNOSIS — R278 Other lack of coordination: Secondary | ICD-10-CM | POA: Diagnosis not present

## 2023-08-27 DIAGNOSIS — R278 Other lack of coordination: Secondary | ICD-10-CM | POA: Diagnosis not present

## 2023-08-27 DIAGNOSIS — M6281 Muscle weakness (generalized): Secondary | ICD-10-CM | POA: Diagnosis not present

## 2023-08-27 DIAGNOSIS — Z96651 Presence of right artificial knee joint: Secondary | ICD-10-CM | POA: Diagnosis not present

## 2023-08-27 DIAGNOSIS — G301 Alzheimer's disease with late onset: Secondary | ICD-10-CM | POA: Diagnosis not present

## 2023-08-27 DIAGNOSIS — Z9181 History of falling: Secondary | ICD-10-CM | POA: Diagnosis not present

## 2023-08-27 DIAGNOSIS — R2681 Unsteadiness on feet: Secondary | ICD-10-CM | POA: Diagnosis not present

## 2023-08-29 DIAGNOSIS — Z96651 Presence of right artificial knee joint: Secondary | ICD-10-CM | POA: Diagnosis not present

## 2023-08-29 DIAGNOSIS — M6281 Muscle weakness (generalized): Secondary | ICD-10-CM | POA: Diagnosis not present

## 2023-08-29 DIAGNOSIS — R278 Other lack of coordination: Secondary | ICD-10-CM | POA: Diagnosis not present

## 2023-08-29 DIAGNOSIS — Z9181 History of falling: Secondary | ICD-10-CM | POA: Diagnosis not present

## 2023-08-29 DIAGNOSIS — G301 Alzheimer's disease with late onset: Secondary | ICD-10-CM | POA: Diagnosis not present

## 2023-08-29 DIAGNOSIS — R2681 Unsteadiness on feet: Secondary | ICD-10-CM | POA: Diagnosis not present

## 2023-09-01 DIAGNOSIS — M6281 Muscle weakness (generalized): Secondary | ICD-10-CM | POA: Diagnosis not present

## 2023-09-01 DIAGNOSIS — G301 Alzheimer's disease with late onset: Secondary | ICD-10-CM | POA: Diagnosis not present

## 2023-09-01 DIAGNOSIS — Z9181 History of falling: Secondary | ICD-10-CM | POA: Diagnosis not present

## 2023-09-01 DIAGNOSIS — Z96651 Presence of right artificial knee joint: Secondary | ICD-10-CM | POA: Diagnosis not present

## 2023-09-01 DIAGNOSIS — R2681 Unsteadiness on feet: Secondary | ICD-10-CM | POA: Diagnosis not present

## 2023-09-01 DIAGNOSIS — R278 Other lack of coordination: Secondary | ICD-10-CM | POA: Diagnosis not present

## 2023-09-03 DIAGNOSIS — R278 Other lack of coordination: Secondary | ICD-10-CM | POA: Diagnosis not present

## 2023-09-03 DIAGNOSIS — Z96651 Presence of right artificial knee joint: Secondary | ICD-10-CM | POA: Diagnosis not present

## 2023-09-03 DIAGNOSIS — Z9181 History of falling: Secondary | ICD-10-CM | POA: Diagnosis not present

## 2023-09-03 DIAGNOSIS — G301 Alzheimer's disease with late onset: Secondary | ICD-10-CM | POA: Diagnosis not present

## 2023-09-03 DIAGNOSIS — R2681 Unsteadiness on feet: Secondary | ICD-10-CM | POA: Diagnosis not present

## 2023-09-03 DIAGNOSIS — M6281 Muscle weakness (generalized): Secondary | ICD-10-CM | POA: Diagnosis not present

## 2023-09-05 DIAGNOSIS — M79671 Pain in right foot: Secondary | ICD-10-CM | POA: Diagnosis not present

## 2023-09-05 DIAGNOSIS — M6281 Muscle weakness (generalized): Secondary | ICD-10-CM | POA: Diagnosis not present

## 2023-09-05 DIAGNOSIS — Z9181 History of falling: Secondary | ICD-10-CM | POA: Diagnosis not present

## 2023-09-05 DIAGNOSIS — G301 Alzheimer's disease with late onset: Secondary | ICD-10-CM | POA: Diagnosis not present

## 2023-09-05 DIAGNOSIS — M79672 Pain in left foot: Secondary | ICD-10-CM | POA: Diagnosis not present

## 2023-09-05 DIAGNOSIS — Z96651 Presence of right artificial knee joint: Secondary | ICD-10-CM | POA: Diagnosis not present

## 2023-09-05 DIAGNOSIS — R2681 Unsteadiness on feet: Secondary | ICD-10-CM | POA: Diagnosis not present

## 2023-09-05 DIAGNOSIS — R278 Other lack of coordination: Secondary | ICD-10-CM | POA: Diagnosis not present

## 2023-09-05 DIAGNOSIS — L602 Onychogryphosis: Secondary | ICD-10-CM | POA: Diagnosis not present

## 2023-09-10 DIAGNOSIS — Z96651 Presence of right artificial knee joint: Secondary | ICD-10-CM | POA: Diagnosis not present

## 2023-09-10 DIAGNOSIS — G301 Alzheimer's disease with late onset: Secondary | ICD-10-CM | POA: Diagnosis not present

## 2023-09-10 DIAGNOSIS — R2681 Unsteadiness on feet: Secondary | ICD-10-CM | POA: Diagnosis not present

## 2023-09-10 DIAGNOSIS — Z9181 History of falling: Secondary | ICD-10-CM | POA: Diagnosis not present

## 2023-09-10 DIAGNOSIS — R278 Other lack of coordination: Secondary | ICD-10-CM | POA: Diagnosis not present

## 2023-09-10 DIAGNOSIS — M6281 Muscle weakness (generalized): Secondary | ICD-10-CM | POA: Diagnosis not present

## 2023-09-11 DIAGNOSIS — E039 Hypothyroidism, unspecified: Secondary | ICD-10-CM | POA: Diagnosis not present

## 2023-09-11 DIAGNOSIS — D649 Anemia, unspecified: Secondary | ICD-10-CM | POA: Diagnosis not present

## 2023-09-12 ENCOUNTER — Non-Acute Institutional Stay (SKILLED_NURSING_FACILITY): Payer: PPO | Admitting: Orthopedic Surgery

## 2023-09-12 ENCOUNTER — Encounter: Payer: Self-pay | Admitting: Orthopedic Surgery

## 2023-09-12 DIAGNOSIS — E890 Postprocedural hypothyroidism: Secondary | ICD-10-CM | POA: Diagnosis not present

## 2023-09-12 DIAGNOSIS — F0390 Unspecified dementia without behavioral disturbance: Secondary | ICD-10-CM | POA: Diagnosis not present

## 2023-09-12 LAB — COMPREHENSIVE METABOLIC PANEL
Albumin: 3.9 (ref 3.5–5.0)
Calcium: 9.3 (ref 8.7–10.7)
Globulin: 2.8
eGFR: 81

## 2023-09-12 LAB — BASIC METABOLIC PANEL
BUN: 16 (ref 4–21)
CO2: 22 (ref 13–22)
Chloride: 98 — AB (ref 99–108)
Creatinine: 0.7 (ref 0.5–1.1)
Glucose: 102
Potassium: 4.5 meq/L (ref 3.5–5.1)
Sodium: 133 — AB (ref 137–147)

## 2023-09-12 LAB — CBC AND DIFFERENTIAL
HCT: 37 (ref 36–46)
Hemoglobin: 11.9 — AB (ref 12.0–16.0)
Neutrophils Absolute: 3900
Platelets: 445 10*3/uL — AB (ref 150–400)
WBC: 6.2

## 2023-09-12 LAB — HEPATIC FUNCTION PANEL
ALT: 10 U/L (ref 7–35)
AST: 14 (ref 13–35)
Alkaline Phosphatase: 68 (ref 25–125)
Bilirubin, Total: 0.4

## 2023-09-12 LAB — TSH: TSH: 15.93 — AB (ref 0.41–5.90)

## 2023-09-12 LAB — CBC: RBC: 3.9 (ref 3.87–5.11)

## 2023-09-12 NOTE — Progress Notes (Signed)
Location:  [facility] Room Number: 25/A Place of Service:  SNF [phone]) Provider:  Octavia Heir, NP   [name], MD  Patient Care Team: [name], MD as PCP - General (Internal Medicine) [name], MD as Consulting Physician (Endocrinology)  Extended Emergency Contact Information Primary Emergency Contact: Snellings,Gordon Address: [address]          [place], [place] [address] Darden Amber of [place] Home Phone: [phone] Mobile Phone: [phone] Relation: Spouse Secondary Emergency Contact: [name],[name] Address: [address]          [place], [place] [address] Darden Amber of [place] Home Phone: [phone] Work Phone: [phone] Mobile Phone: [phone] Relation: Son  Code Status:  DNR Goals of care: Advanced Directive information    07/18/2023    9:30 AM  Advanced Directives  Does Patient Have a Medical Advance Directive? Yes  Type of Estate agent of [place];Living will;Out of facility DNR (pink MOST or yellow form)  Does patient want to make changes to medical advance directive? No - Patient declined  Copy of Healthcare Power of Attorney in Chart? Yes - validated most recent copy scanned in chart (See row information)     Chief Complaint  Patient presents with   Acute Visit    Elevated TSH    HPI:  Pt is a 89 y.o. female seen today for acute visit due to elevated TSH level.   She currently resides on the skilled nursing unit at [facility]. PMH: HTN, migraines, rectal bleeding, hypothyroidism, s/p thyroidectomy (left) 2017, dementia, osteoporosis, constipation, h/o breast and skin cancer, abnormal gait and insomnia.   H/o left love thyroidectomy 2017. Routine labs reveal TSH 15.93> was 4.50 (08/18). She is prescribed levothyroxine 112 mcg daily. Per chart review, appears levothyroxine is being administered with other medications in morning.   No behaviors. MMSE 23/30  07/2022. Dependent with ALDs except feeding. Unable to tolerate Namenda/Aricept in past. Not on medication.   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   Hypertension    Insomnia    Knee pain, bilateral 2014   Lymphedema    Left arm following mastectomy   Memory impairment    Memory loss    Migraine 05/16/2012   Migraine without aura, without mention of intractable migraine without mention of status migrainosus    patient denies at preop of 08/08/16    Other and unspecified hyperlipidemia    Other bursitis disorders    Other infective bursitis, left shoulder    Pain in joint, ankle and foot    Pain in joint, hand    Pain in joint, lower leg    Pain in joint, shoulder region    Pain in joint, shoulder region 04/23/2016   Pain in limb    Rectal bleeding 08/20/2016   Senile osteoporosis    Symptomatic menopausal or female climacteric states    Syncope and collapse    Unspecified hypertensive heart disease without heart failure 1995   Unspecified hypothyroidism    Unspecified polyarthropathy or polyarthritis, site unspecified  Unspecified urinary incontinence    Unspecified visual loss    Xerophthalmia    Past Surgical History:  Procedure Laterality Date   ABDOMINAL HYSTERECTOMY     BILATERAL OOPHORECTOMY  1981   BREAST RECONSTRUCTION Left 01/2000   BREAST REDUCTION SURGERY Right 01/2000   CATARACT EXTRACTION Right 09/04/2005   COLONOSCOPY  01/2004   MASTECTOMY Left 10/1999   THYROID LOBECTOMY Left 08/14/2016   Procedure: NEAR TOTAL THYROIDECTOMY;  Surgeon: Luretha Murphy, MD;  Location: [facility];  Service: General;  Laterality: Left;   TOTAL ABDOMINAL HYSTERECTOMY W/  BILATERAL SALPINGOOPHORECTOMY  1981   TOTAL KNEE ARTHROPLASTY Right 2014    Allergies  Allergen Reactions   Donepezil Hcl     Body pain and stiffness    Namenda [Memantine Hcl]      Caused body pain    Sulfa Antibiotics    Sulfamethoxazole Rash    Outpatient Encounter Medications as of 09/12/2023  Medication Sig   acetaminophen (TYLENOL) 325 MG tablet Take 650 mg by mouth 3 (three) times daily.   amLODipine (NORVASC) 10 MG tablet Take 1 tablet (10 mg total) by mouth daily.   Cholecalciferol 5000 units capsule Take 5,000 Units by mouth daily.   diclofenac Sodium (VOLTAREN) 1 % GEL Apply 2 g topically 2 (two) times a week. Apply to right hip/thigh one time daily for pain after shower on Tuesday and Friday.   levothyroxine (SYNTHROID) 112 MCG tablet Take 112 mcg by mouth daily before breakfast.   lidocaine (HM LIDOCAINE PATCH) 4 % Place 2 patches onto the skin daily. Apply to lower back topically one time a day.   losartan (COZAAR) 100 MG tablet Take 100 mg by mouth daily.   melatonin 5 MG TABS Take 1 tablet (5 mg total) by mouth at bedtime.   Menthol, Topical Analgesic, (BIOFREEZE) 4 % GEL Apply 1 Application topically as needed. Apply to lower legs topically as needed for pain.   Multiple Vitamins-Minerals (THEREMS-M) TABS Take 1 tablet by mouth every morning.   Omega-3 Fatty Acids (OMEGA-3 CF PO) Take 1,000 mg by mouth every morning. With food   Ostomy Supplies (SKIN PREP WIPES) MISC 2 Applications by Does not apply route 2 (two) times daily. Apply to heels topically for skin care treatment.   oxybutynin (DITROPAN XL) 15 MG 24 hr tablet Take 15 mg by mouth at bedtime.   Polyethyl Glycol-Propyl Glycol 0.4-0.3 % SOLN Place 2 drops into both eyes daily as needed (dry eyes).    Psyllium (REGULOID) 48.57 % POWD Take by mouth daily. 1tsp   senna-docusate (SENNA S) 8.6-50 MG tablet Take 2 tablets by mouth daily.   triamcinolone (KENALOG) 0.025 % cream Apply 1 application  topically as needed  (Apply to affected area topically).   zinc oxide 20 % ointment Apply 1 application  topically as needed for irritation. Apply to buttocks topically after every incontinent episode for redness   No facility-administered encounter medications on file as of 09/12/2023.    Review of Systems  Unable to perform ROS: Dementia    Immunization History  Administered Date(s) Administered   Fluad Quad(high Dose 65+) 10/02/2022   Influenza, High Dose Seasonal PF 09/17/2017, 09/10/2018, 09/22/2019, 09/13/2021   Influenza, Quadrivalent, Recombinant, Inj, Pf 08/29/2020   Influenza,inj,Quad PF,6+ Mos 09/10/2018   Influenza-Unspecified 08/10/2013, 09/07/2015, 09/13/2016   Moderna Covid-19 Vaccine Bivalent Booster 7yrs & up 05/22/2022   Moderna SARS-COV2 Booster Vaccination 05/16/2021   Moderna Sars-Covid-2 Vaccination 12/13/2019, 01/10/2020, 10/23/2020   PFIZER(Purple Top)SARS-COV-2 Vaccination 08/29/2021  [employer] officer, [employer] 44yrs & up 10/15/2022   Pneumococcal Conjugate-13 06/08/2017, 11/02/2018   Pneumococcal Polysaccharide-23 12/09/2002, 06/08/2017, 10/22/2017, 11/02/2018   Pneumococcal-Unspecified 06/08/2017, 11/02/2018   Tdap 02/21/2014, 10/29/2017   Zoster Recombinant(Shingrix) 07/18/2017, 01/10/2018   Zoster, Live 07/18/2017, 01/10/2018   Pertinent  Health Maintenance Due  Topic Date Due   INFLUENZA VACCINE  07/10/2023   DEXA SCAN  Completed      11/18/2022    2:34 PM 12/16/2022   10:20 AM 02/21/2023    2:43 PM 03/18/2023    2:13 PM 07/18/2023    3:01 PM  Fall Risk  Falls in the past year? 0 0 1 0 1  Was there an injury with Fall? 0 0 0 0 0  Fall Risk Category Calculator 0 0 2 0 1  Fall Risk Category (Retired) Low Low     (RETIRED) Patient Fall Risk Level Moderate fall risk Moderate fall risk     Patient at Risk for Falls Due to History of fall(s) History of fall(s) History of fall(s);Impaired balance/gait History of fall(s);Impaired balance/gait History of  fall(s);Impaired balance/gait;Impaired mobility  Patient at Risk for Falls Due to - Comments   H/o dementia    Fall risk Follow up Falls evaluation completed Falls evaluation completed Falls evaluation completed;Education provided;Falls prevention discussed Falls evaluation completed Falls evaluation completed;Education provided;Falls prevention discussed   Functional Status Survey:    Vitals:   09/12/23 1346  BP: 134/80  Pulse: 70  Resp: 20  Temp: 97.7 F (36.5 C)  SpO2: 98%  Weight: 176 lb 12.8 oz (80.2 kg)  Height: 5\' 4"  (1.626 m)   Body mass index is 30.35 kg/m. Physical Exam Vitals reviewed.  Constitutional:      General: She is not in acute distress. HENT:     Head: Normocephalic.  Eyes:     General:        Right eye: No discharge.        Left eye: No discharge.  Cardiovascular:     Rate and Rhythm: Normal rate and regular rhythm.     Pulses: Normal pulses.     Heart sounds: Normal heart sounds.  Pulmonary:     Effort: Pulmonary effort is normal.     Breath sounds: Normal breath sounds.  Abdominal:     General: Bowel sounds are normal.     Palpations: Abdomen is soft.  Musculoskeletal:     Cervical back: Neck supple.     Right lower leg: No edema.     Left lower leg: No edema.  Skin:    General: Skin is warm.  Neurological:     General: No focal deficit present.     Mental Status: She is alert. Mental status is at baseline.     Motor: Weakness present.     Gait: Gait abnormal.  Psychiatric:        Mood and Affect: Mood normal.     Comments: aphasia     Labs reviewed: Recent Labs    02/13/23 0000 02/20/23 0000 03/27/23 0000  NA 129* 131* 133*  K 4.3 4.5 4.5  CL 94* 98* 100  CO2 28* 26* 27*  BUN 18 18 18   CREATININE 0.7 0.7 0.7  CALCIUM 9.6 9.2 9.5   No results for input(s): "AST", "ALT", "ALKPHOS", "BILITOT", "PROT", "ALBUMIN" in the last 8760 hours. Recent Labs    06/10/23 0000  WBC 8.4  HGB 13.9  HCT 42  PLT 363   Lab Results   Component  Value Date   TSH 4.50 07/26/2022   No results found for: "HGBA1C" Lab Results  Component Value Date   CHOL 190 07/26/2020   HDL 56 07/26/2020   LDLCALC 109 (H) 07/26/2020   TRIG 139 07/26/2020   CHOLHDL 3.4 07/26/2020    Significant Diagnostic Results in last 30 days:  No results found.  Assessment/Plan 1. Postoperative hypothyroidism - s/p left thyroidectomy 2017 - TSH 15.93 (10/03)> was 4.50 (07/2022) - levothyroxine not being administered on empty stomach - will change administration time to 6AM - TSH in 6 weeks  2. Dementia without behavioral disturbance (HCC) - no behaviors - doing well in SNF - weight stable - ambulates in w/c - unable to tolerate Namenda/Aricept - cont skilled nursing     Family/ staff Communication: plan discussed with patient and nurse  Labs/tests ordered:  TSH in 6 weeks

## 2023-09-15 DIAGNOSIS — Z96651 Presence of right artificial knee joint: Secondary | ICD-10-CM | POA: Diagnosis not present

## 2023-09-15 DIAGNOSIS — R278 Other lack of coordination: Secondary | ICD-10-CM | POA: Diagnosis not present

## 2023-09-15 DIAGNOSIS — R2681 Unsteadiness on feet: Secondary | ICD-10-CM | POA: Diagnosis not present

## 2023-09-15 DIAGNOSIS — M6281 Muscle weakness (generalized): Secondary | ICD-10-CM | POA: Diagnosis not present

## 2023-09-15 DIAGNOSIS — G301 Alzheimer's disease with late onset: Secondary | ICD-10-CM | POA: Diagnosis not present

## 2023-09-15 DIAGNOSIS — Z9181 History of falling: Secondary | ICD-10-CM | POA: Diagnosis not present

## 2023-09-16 DIAGNOSIS — G301 Alzheimer's disease with late onset: Secondary | ICD-10-CM | POA: Diagnosis not present

## 2023-09-16 DIAGNOSIS — Z9181 History of falling: Secondary | ICD-10-CM | POA: Diagnosis not present

## 2023-09-16 DIAGNOSIS — M6281 Muscle weakness (generalized): Secondary | ICD-10-CM | POA: Diagnosis not present

## 2023-09-16 DIAGNOSIS — Z96651 Presence of right artificial knee joint: Secondary | ICD-10-CM | POA: Diagnosis not present

## 2023-09-16 DIAGNOSIS — R2681 Unsteadiness on feet: Secondary | ICD-10-CM | POA: Diagnosis not present

## 2023-09-16 DIAGNOSIS — R278 Other lack of coordination: Secondary | ICD-10-CM | POA: Diagnosis not present

## 2023-09-17 DIAGNOSIS — Z9181 History of falling: Secondary | ICD-10-CM | POA: Diagnosis not present

## 2023-09-17 DIAGNOSIS — G301 Alzheimer's disease with late onset: Secondary | ICD-10-CM | POA: Diagnosis not present

## 2023-09-17 DIAGNOSIS — Z96651 Presence of right artificial knee joint: Secondary | ICD-10-CM | POA: Diagnosis not present

## 2023-09-17 DIAGNOSIS — M6281 Muscle weakness (generalized): Secondary | ICD-10-CM | POA: Diagnosis not present

## 2023-09-17 DIAGNOSIS — R2681 Unsteadiness on feet: Secondary | ICD-10-CM | POA: Diagnosis not present

## 2023-09-17 DIAGNOSIS — R278 Other lack of coordination: Secondary | ICD-10-CM | POA: Diagnosis not present

## 2023-09-18 ENCOUNTER — Non-Acute Institutional Stay (SKILLED_NURSING_FACILITY): Payer: PPO | Admitting: Internal Medicine

## 2023-09-18 ENCOUNTER — Encounter: Payer: Self-pay | Admitting: Internal Medicine

## 2023-09-18 DIAGNOSIS — E890 Postprocedural hypothyroidism: Secondary | ICD-10-CM

## 2023-09-18 DIAGNOSIS — F02B4 Dementia in other diseases classified elsewhere, moderate, with anxiety: Secondary | ICD-10-CM

## 2023-09-18 DIAGNOSIS — G301 Alzheimer's disease with late onset: Secondary | ICD-10-CM

## 2023-09-18 DIAGNOSIS — E871 Hypo-osmolality and hyponatremia: Secondary | ICD-10-CM

## 2023-09-18 DIAGNOSIS — I10 Essential (primary) hypertension: Secondary | ICD-10-CM | POA: Diagnosis not present

## 2023-09-18 DIAGNOSIS — M25551 Pain in right hip: Secondary | ICD-10-CM | POA: Diagnosis not present

## 2023-09-18 DIAGNOSIS — N3946 Mixed incontinence: Secondary | ICD-10-CM

## 2023-09-18 NOTE — Progress Notes (Signed)
Location:  [facility] Room Number: 25A Place of Service:  SNF [phone]) Provider:  [name], MD   [name], MD  Patient Care Team: [name], MD as PCP - General (Internal Medicine) [name], MD as Consulting Physician (Endocrinology)  Extended Emergency Contact Information Primary Emergency Contact: Shenberger,Gordon Address: [address]          [place], [place] [address] Darden Amber of [place] Home Phone: [phone] Mobile Phone: [phone] Relation: Spouse Secondary Emergency Contact: [name],[name] Address: [address]          [place], [place] [address] Darden Amber of [place] Home Phone: [phone] Work Phone: [phone] Mobile Phone: [phone] Relation: Son  Code Status:  DNR Goals of care: Advanced Directive information    09/18/2023   11:30 AM  Advanced Directives  Does Patient Have a Medical Advance Directive? Yes  Type of Estate agent of [place];Living will;Out of facility DNR (pink MOST or yellow form)  Does patient want to make changes to medical advance directive? No - Patient declined  Copy of Healthcare Power of Attorney in Chart? No - copy requested     Chief Complaint  Patient presents with   Medical Management of Chronic Issues    Routine visit   Immunizations    Patient is due for flu and covid vaccine    HPI:  Pt is a 89 y.o. female seen today for medical management of chronic diseases.    Lives in SNF in [facility] has h/o Hypertension, Hypothyroidism, Insomnia and Cognitive impairment. Also Has h/o Skin Cancer and Thyroid Cancer  Urinary Incontinence Failed Pessary Right hip Pain Has severe arthritis  Dementia Worsening in the evening She gets very anxious  Looking for her husband and  Kids Patient says she does remember doing that Her husband in the room lives in [place]  And is usually goes back to his room in evening No Other issues Wt Readings from  Last 3 Encounters:  09/18/23 176 lb 12.8 oz (80.2 kg)  09/12/23 176 lb 12.8 oz (80.2 kg)  08/22/23 177 lb 1.6 oz (80.3 kg)   Uses wheelchair now due to Arthritis Needs help with her ADLS  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   Hypertension    Insomnia    Knee pain, bilateral 2014   Lymphedema    Left arm following mastectomy   Memory impairment    Memory loss    Migraine 05/16/2012   Migraine without aura, without mention of intractable migraine without mention of status migrainosus    patient denies at preop of 08/08/16    Other and unspecified hyperlipidemia    Other bursitis disorders    Other infective bursitis, left shoulder    Pain in joint, ankle and foot    Pain in joint, hand    Pain in joint, lower leg    Pain in joint, shoulder region    Pain in joint, shoulder region 04/23/2016   Pain in limb    Rectal bleeding 08/20/2016   Senile osteoporosis    Symptomatic  menopausal or female climacteric states    Syncope and collapse    Unspecified hypertensive heart disease without heart failure 1995   Unspecified hypothyroidism    Unspecified polyarthropathy or polyarthritis, site unspecified    Unspecified urinary incontinence    Unspecified visual loss    Xerophthalmia    Past Surgical History:  Procedure Laterality Date   ABDOMINAL HYSTERECTOMY     BILATERAL OOPHORECTOMY  1981   BREAST RECONSTRUCTION Left 01/2000   BREAST REDUCTION SURGERY Right 01/2000   CATARACT EXTRACTION Right 09/04/2005   COLONOSCOPY  01/2004   MASTECTOMY Left 10/1999   THYROID LOBECTOMY Left 08/14/2016   Procedure: NEAR TOTAL THYROIDECTOMY;  Surgeon: Luretha Murphy, MD;  Location: [facility]  [facility];  Service: General;  Laterality: Left;   TOTAL ABDOMINAL HYSTERECTOMY W/ BILATERAL SALPINGOOPHORECTOMY  1981   TOTAL KNEE ARTHROPLASTY Right 2014    Allergies  Allergen Reactions   Donepezil Hcl     Body pain and stiffness    Namenda [Memantine Hcl]      Caused body pain    Sulfa Antibiotics    Sulfamethoxazole Rash    Outpatient Encounter Medications as of 09/18/2023  Medication Sig   acetaminophen (TYLENOL) 325 MG tablet Take 650 mg by mouth 3 (three) times daily.   amLODipine (NORVASC) 10 MG tablet Take 1 tablet (10 mg total) by mouth daily.   Cholecalciferol 5000 units capsule Take 5,000 Units by mouth daily.   diclofenac Sodium (VOLTAREN) 1 % GEL Apply 2 g topically 2 (two) times a week. Apply to right hip/thigh one time daily for pain after shower on Tuesday and Friday.   levothyroxine (SYNTHROID) 112 MCG tablet Take 112 mcg by mouth daily before breakfast.   lidocaine (HM LIDOCAINE PATCH) 4 % Place 2 patches onto the skin daily. Apply to lower back topically one time a day.   losartan (COZAAR) 100 MG tablet Take 100 mg by mouth daily.   melatonin 5 MG TABS Take 1 tablet (5 mg total) by mouth at bedtime.   Menthol, Topical Analgesic, (BIOFREEZE) 4 % GEL Apply 1 Application topically as needed. Apply to lower legs topically as needed for pain.   Multiple Vitamins-Minerals (THEREMS-M) TABS Take 1 tablet by mouth every morning.   Omega-3 Fatty Acids (OMEGA-3 CF PO) Take 1,000 mg by mouth every morning. With food   Ostomy Supplies (SKIN PREP WIPES) MISC 2 Applications by Does not apply route 2 (two) times daily. Apply to heels topically for skin care treatment.   oxybutynin (DITROPAN XL) 15 MG 24 hr tablet Take 15 mg by mouth at bedtime.   Polyethyl Glycol-Propyl Glycol 0.4-0.3 % SOLN Place 2 drops into both eyes daily as needed (dry eyes).    Psyllium (REGULOID) 48.57 % POWD Take by mouth daily. 1tsp   senna-docusate (SENNA S) 8.6-50 MG tablet Take 2 tablets by mouth daily.    triamcinolone (KENALOG) 0.025 % cream Apply 1 application  topically as needed (Apply to affected area topically).   zinc oxide 20 % ointment Apply 1 application  topically as needed for irritation. Apply to buttocks topically after every incontinent episode for redness   No facility-administered encounter medications on file as of 09/18/2023.    Review of Systems  Constitutional:  Negative for activity change and appetite change.  HENT: Negative.    Respiratory:  Negative for cough and shortness of breath.   Cardiovascular:  Negative for leg swelling.  Gastrointestinal:  Negative for constipation.  Genitourinary: Negative.   Musculoskeletal:  Positive  for arthralgias and gait problem. Negative for myalgias.  Skin: Negative.   Neurological:  Negative for dizziness and weakness.  Psychiatric/Behavioral:  Positive for confusion. Negative for dysphoric mood and sleep disturbance. The patient is nervous/anxious.     Immunization History  Administered Date(s) Administered   Fluad Quad(high Dose 65+) 10/02/2022   Influenza, High Dose Seasonal PF 09/17/2017, 09/10/2018, 09/22/2019, 09/13/2021   Influenza, Quadrivalent, Recombinant, Inj, Pf 08/29/2020   Influenza,inj,Quad PF,6+ Mos 09/10/2018   Influenza-Unspecified 08/10/2013, 09/07/2015, 09/13/2016   Moderna Covid-19 Vaccine Bivalent Booster 100yrs & up 05/22/2022   Moderna SARS-COV2 Booster Vaccination 05/16/2021   Moderna Sars-Covid-2 Vaccination 12/13/2019, 01/10/2020, 10/23/2020   PFIZER(Purple Top)SARS-COV-2 Vaccination 08/29/2021   Pfizer Covid-19 Vaccine Bivalent Booster 28yrs & up 10/15/2022   Pneumococcal Conjugate-13 06/08/2017, 11/02/2018   Pneumococcal Polysaccharide-23 12/09/2002, 06/08/2017, 10/22/2017, 11/02/2018   Pneumococcal-Unspecified 06/08/2017, 11/02/2018   Tdap 02/21/2014, 10/29/2017   Zoster Recombinant(Shingrix) 07/18/2017, 01/10/2018   Zoster, Live 07/18/2017, 01/10/2018   Pertinent  Health Maintenance Due   Topic Date Due   INFLUENZA VACCINE  07/10/2023   DEXA SCAN  Completed      11/18/2022    2:34 PM 12/16/2022   10:20 AM 02/21/2023    2:43 PM 03/18/2023    2:13 PM 07/18/2023    3:01 PM  Fall Risk  Falls in the past year? 0 0 1 0 1  Was there an injury with Fall? 0 0 0 0 0  Fall Risk Category Calculator 0 0 2 0 1  Fall Risk Category (Retired) Low Low     (RETIRED) Patient Fall Risk Level Moderate fall risk Moderate fall risk     Patient at Risk for Falls Due to History of fall(s) History of fall(s) History of fall(s);Impaired balance/gait History of fall(s);Impaired balance/gait History of fall(s);Impaired balance/gait;Impaired mobility  Patient at Risk for Falls Due to - Comments   H/o dementia    Fall risk Follow up Falls evaluation completed Falls evaluation completed Falls evaluation completed;Education provided;Falls prevention discussed Falls evaluation completed Falls evaluation completed;Education provided;Falls prevention discussed   Functional Status Survey:    Vitals:   09/18/23 1122  BP: (!) 126/54  Pulse: 82  Resp: 18  Temp: (!) 96.2 F (35.7 C)  TempSrc: Temporal  SpO2: 97%  Weight: 176 lb 12.8 oz (80.2 kg)  Height: 5\' 4"  (1.626 m)   Body mass index is 30.35 kg/m. Physical Exam Vitals reviewed.  Constitutional:      Appearance: Normal appearance.  HENT:     Head: Normocephalic.     Nose: Nose normal.     Mouth/Throat:     Mouth: Mucous membranes are moist.     Pharynx: Oropharynx is clear.  Eyes:     Pupils: Pupils are equal, round, and reactive to light.  Cardiovascular:     Rate and Rhythm: Normal rate and regular rhythm.     Pulses: Normal pulses.     Heart sounds: Normal heart sounds. No murmur heard. Pulmonary:     Effort: Pulmonary effort is normal.     Breath sounds: Normal breath sounds.  Abdominal:     General: Abdomen is flat. Bowel sounds are normal.     Palpations: Abdomen is soft.  Musculoskeletal:        General: No swelling.      Cervical back: Neck supple.  Skin:    General: Skin is warm.  Neurological:     General: No focal deficit present.     Mental Status: She is alert.  Psychiatric:        Mood and Affect: Mood normal.        Thought Content: Thought content normal.     Labs reviewed: Recent Labs    02/13/23 0000 02/20/23 0000 03/27/23 0000  NA 129* 131* 133*  K 4.3 4.5 4.5  CL 94* 98* 100  CO2 28* 26* 27*  BUN 18 18 18   CREATININE 0.7 0.7 0.7  CALCIUM 9.6 9.2 9.5   No results for input(s): "AST", "ALT", "ALKPHOS", "BILITOT", "PROT", "ALBUMIN" in the last 8760 hours. Recent Labs    06/10/23 0000  WBC 8.4  HGB 13.9  HCT 42  PLT 363   Lab Results  Component Value Date   TSH 4.50 07/26/2022   No results found for: "HGBA1C" Lab Results  Component Value Date   CHOL 190 07/26/2020   HDL 56 07/26/2020   LDLCALC 109 (H) 07/26/2020   TRIG 139 07/26/2020   CHOLHDL 3.4 07/26/2020    Significant Diagnostic Results in last 30 days:  No results found.  Assessment/Plan 1. Primary hypertension On Norvasc and Cozaar  2. Postoperative hypothyroidism TSH was high 15 Repeat TSH pending Dose was not changed  3. 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 Hyponatremia Sodium stable  5. Mixed stress and urge urinary incontinence High Dose Ditropan per Urology No Side effects  6. Right hip pain Tylenol PRN    Family/ staff Communication:   Labs/tests ordered:

## 2023-09-19 DIAGNOSIS — R2681 Unsteadiness on feet: Secondary | ICD-10-CM | POA: Diagnosis not present

## 2023-09-19 DIAGNOSIS — Z9181 History of falling: Secondary | ICD-10-CM | POA: Diagnosis not present

## 2023-09-19 DIAGNOSIS — G301 Alzheimer's disease with late onset: Secondary | ICD-10-CM | POA: Diagnosis not present

## 2023-09-19 DIAGNOSIS — Z96651 Presence of right artificial knee joint: Secondary | ICD-10-CM | POA: Diagnosis not present

## 2023-09-19 DIAGNOSIS — M6281 Muscle weakness (generalized): Secondary | ICD-10-CM | POA: Diagnosis not present

## 2023-09-19 DIAGNOSIS — R278 Other lack of coordination: Secondary | ICD-10-CM | POA: Diagnosis not present

## 2023-09-22 DIAGNOSIS — M6281 Muscle weakness (generalized): Secondary | ICD-10-CM | POA: Diagnosis not present

## 2023-09-22 DIAGNOSIS — R2681 Unsteadiness on feet: Secondary | ICD-10-CM | POA: Diagnosis not present

## 2023-09-22 DIAGNOSIS — Z96651 Presence of right artificial knee joint: Secondary | ICD-10-CM | POA: Diagnosis not present

## 2023-09-22 DIAGNOSIS — Z9181 History of falling: Secondary | ICD-10-CM | POA: Diagnosis not present

## 2023-09-22 DIAGNOSIS — G301 Alzheimer's disease with late onset: Secondary | ICD-10-CM | POA: Diagnosis not present

## 2023-09-22 DIAGNOSIS — R278 Other lack of coordination: Secondary | ICD-10-CM | POA: Diagnosis not present

## 2023-09-23 DIAGNOSIS — H40053 Ocular hypertension, bilateral: Secondary | ICD-10-CM | POA: Diagnosis not present

## 2023-09-23 DIAGNOSIS — H04123 Dry eye syndrome of bilateral lacrimal glands: Secondary | ICD-10-CM | POA: Diagnosis not present

## 2023-09-23 DIAGNOSIS — Z961 Presence of intraocular lens: Secondary | ICD-10-CM | POA: Diagnosis not present

## 2023-09-23 DIAGNOSIS — H524 Presbyopia: Secondary | ICD-10-CM | POA: Diagnosis not present

## 2023-09-24 DIAGNOSIS — Z9181 History of falling: Secondary | ICD-10-CM | POA: Diagnosis not present

## 2023-09-24 DIAGNOSIS — R278 Other lack of coordination: Secondary | ICD-10-CM | POA: Diagnosis not present

## 2023-09-24 DIAGNOSIS — R2681 Unsteadiness on feet: Secondary | ICD-10-CM | POA: Diagnosis not present

## 2023-09-24 DIAGNOSIS — Z96651 Presence of right artificial knee joint: Secondary | ICD-10-CM | POA: Diagnosis not present

## 2023-09-24 DIAGNOSIS — M6281 Muscle weakness (generalized): Secondary | ICD-10-CM | POA: Diagnosis not present

## 2023-09-24 DIAGNOSIS — G301 Alzheimer's disease with late onset: Secondary | ICD-10-CM | POA: Diagnosis not present

## 2023-09-26 DIAGNOSIS — Z96651 Presence of right artificial knee joint: Secondary | ICD-10-CM | POA: Diagnosis not present

## 2023-09-26 DIAGNOSIS — G301 Alzheimer's disease with late onset: Secondary | ICD-10-CM | POA: Diagnosis not present

## 2023-09-26 DIAGNOSIS — R2681 Unsteadiness on feet: Secondary | ICD-10-CM | POA: Diagnosis not present

## 2023-09-26 DIAGNOSIS — R278 Other lack of coordination: Secondary | ICD-10-CM | POA: Diagnosis not present

## 2023-09-26 DIAGNOSIS — Z9181 History of falling: Secondary | ICD-10-CM | POA: Diagnosis not present

## 2023-09-26 DIAGNOSIS — M6281 Muscle weakness (generalized): Secondary | ICD-10-CM | POA: Diagnosis not present

## 2023-09-29 DIAGNOSIS — R2681 Unsteadiness on feet: Secondary | ICD-10-CM | POA: Diagnosis not present

## 2023-09-29 DIAGNOSIS — R278 Other lack of coordination: Secondary | ICD-10-CM | POA: Diagnosis not present

## 2023-09-29 DIAGNOSIS — G301 Alzheimer's disease with late onset: Secondary | ICD-10-CM | POA: Diagnosis not present

## 2023-09-29 DIAGNOSIS — Z9181 History of falling: Secondary | ICD-10-CM | POA: Diagnosis not present

## 2023-09-29 DIAGNOSIS — Z96651 Presence of right artificial knee joint: Secondary | ICD-10-CM | POA: Diagnosis not present

## 2023-09-29 DIAGNOSIS — M6281 Muscle weakness (generalized): Secondary | ICD-10-CM | POA: Diagnosis not present

## 2023-10-01 DIAGNOSIS — Z9181 History of falling: Secondary | ICD-10-CM | POA: Diagnosis not present

## 2023-10-01 DIAGNOSIS — R2681 Unsteadiness on feet: Secondary | ICD-10-CM | POA: Diagnosis not present

## 2023-10-01 DIAGNOSIS — G301 Alzheimer's disease with late onset: Secondary | ICD-10-CM | POA: Diagnosis not present

## 2023-10-01 DIAGNOSIS — M6281 Muscle weakness (generalized): Secondary | ICD-10-CM | POA: Diagnosis not present

## 2023-10-01 DIAGNOSIS — R278 Other lack of coordination: Secondary | ICD-10-CM | POA: Diagnosis not present

## 2023-10-01 DIAGNOSIS — Z96651 Presence of right artificial knee joint: Secondary | ICD-10-CM | POA: Diagnosis not present

## 2023-10-03 DIAGNOSIS — R2681 Unsteadiness on feet: Secondary | ICD-10-CM | POA: Diagnosis not present

## 2023-10-03 DIAGNOSIS — G301 Alzheimer's disease with late onset: Secondary | ICD-10-CM | POA: Diagnosis not present

## 2023-10-03 DIAGNOSIS — Z96651 Presence of right artificial knee joint: Secondary | ICD-10-CM | POA: Diagnosis not present

## 2023-10-03 DIAGNOSIS — M6281 Muscle weakness (generalized): Secondary | ICD-10-CM | POA: Diagnosis not present

## 2023-10-03 DIAGNOSIS — Z9181 History of falling: Secondary | ICD-10-CM | POA: Diagnosis not present

## 2023-10-03 DIAGNOSIS — R278 Other lack of coordination: Secondary | ICD-10-CM | POA: Diagnosis not present

## 2023-10-06 DIAGNOSIS — Z96651 Presence of right artificial knee joint: Secondary | ICD-10-CM | POA: Diagnosis not present

## 2023-10-06 DIAGNOSIS — G301 Alzheimer's disease with late onset: Secondary | ICD-10-CM | POA: Diagnosis not present

## 2023-10-06 DIAGNOSIS — R2681 Unsteadiness on feet: Secondary | ICD-10-CM | POA: Diagnosis not present

## 2023-10-06 DIAGNOSIS — R278 Other lack of coordination: Secondary | ICD-10-CM | POA: Diagnosis not present

## 2023-10-06 DIAGNOSIS — Z9181 History of falling: Secondary | ICD-10-CM | POA: Diagnosis not present

## 2023-10-06 DIAGNOSIS — M6281 Muscle weakness (generalized): Secondary | ICD-10-CM | POA: Diagnosis not present

## 2023-10-08 DIAGNOSIS — G301 Alzheimer's disease with late onset: Secondary | ICD-10-CM | POA: Diagnosis not present

## 2023-10-08 DIAGNOSIS — R278 Other lack of coordination: Secondary | ICD-10-CM | POA: Diagnosis not present

## 2023-10-08 DIAGNOSIS — R2681 Unsteadiness on feet: Secondary | ICD-10-CM | POA: Diagnosis not present

## 2023-10-08 DIAGNOSIS — Z96651 Presence of right artificial knee joint: Secondary | ICD-10-CM | POA: Diagnosis not present

## 2023-10-08 DIAGNOSIS — M6281 Muscle weakness (generalized): Secondary | ICD-10-CM | POA: Diagnosis not present

## 2023-10-08 DIAGNOSIS — Z9181 History of falling: Secondary | ICD-10-CM | POA: Diagnosis not present

## 2023-10-10 DIAGNOSIS — R2681 Unsteadiness on feet: Secondary | ICD-10-CM | POA: Diagnosis not present

## 2023-10-10 DIAGNOSIS — M6281 Muscle weakness (generalized): Secondary | ICD-10-CM | POA: Diagnosis not present

## 2023-10-10 DIAGNOSIS — Z9181 History of falling: Secondary | ICD-10-CM | POA: Diagnosis not present

## 2023-10-10 DIAGNOSIS — Z96651 Presence of right artificial knee joint: Secondary | ICD-10-CM | POA: Diagnosis not present

## 2023-10-10 DIAGNOSIS — R278 Other lack of coordination: Secondary | ICD-10-CM | POA: Diagnosis not present

## 2023-10-10 DIAGNOSIS — G301 Alzheimer's disease with late onset: Secondary | ICD-10-CM | POA: Diagnosis not present

## 2023-10-20 ENCOUNTER — Non-Acute Institutional Stay (SKILLED_NURSING_FACILITY): Payer: Self-pay | Admitting: Orthopedic Surgery

## 2023-10-20 ENCOUNTER — Encounter: Payer: Self-pay | Admitting: Orthopedic Surgery

## 2023-10-20 DIAGNOSIS — G301 Alzheimer's disease with late onset: Secondary | ICD-10-CM

## 2023-10-20 DIAGNOSIS — I10 Essential (primary) hypertension: Secondary | ICD-10-CM | POA: Diagnosis not present

## 2023-10-20 DIAGNOSIS — E890 Postprocedural hypothyroidism: Secondary | ICD-10-CM

## 2023-10-20 DIAGNOSIS — G8929 Other chronic pain: Secondary | ICD-10-CM | POA: Diagnosis not present

## 2023-10-20 DIAGNOSIS — M545 Low back pain, unspecified: Secondary | ICD-10-CM

## 2023-10-20 DIAGNOSIS — M810 Age-related osteoporosis without current pathological fracture: Secondary | ICD-10-CM | POA: Diagnosis not present

## 2023-10-20 DIAGNOSIS — N3946 Mixed incontinence: Secondary | ICD-10-CM | POA: Diagnosis not present

## 2023-10-20 DIAGNOSIS — F02B4 Dementia in other diseases classified elsewhere, moderate, with anxiety: Secondary | ICD-10-CM

## 2023-10-20 NOTE — Progress Notes (Signed)
Location:   [facility]  Nursing Home Room Number: 25-A Place of Service:  SNF [phone]) Provider:  Hazle Nordmann, NP  PCP: [name], MD  Patient Care Team: [name], MD as PCP - General (Internal Medicine) [name], MD as Consulting Physician (Endocrinology)  Extended Emergency Contact Information Primary Emergency Contact: Nardone,Gordon Address: [address]          [place], [place] [address] Darden Amber of [place] Home Phone: [phone] Mobile Phone: [phone] Relation: Spouse Secondary Emergency Contact: [name],[name] Address: [address]          [place], [place] [address] Darden Amber of [place] Home Phone: [phone] Work Phone: [phone] Mobile Phone: [phone] Relation: Son  Code Status:  DNR Goals of care: Advanced Directive information    10/20/2023    9:41 AM  Advanced Directives  Does Patient Have a Medical Advance Directive? Yes  Type of Estate agent of [place];Living will;Out of facility DNR (pink MOST or yellow form)  Does patient want to make changes to medical advance directive? No - Patient declined  Copy of Healthcare Power of Attorney in Chart? Yes - validated most recent copy scanned in chart (See row information)     Chief Complaint  Patient presents with   Medical Management of Chronic Issues    Routine Visit.    Immunizations    Discuss the need for [employer].     HPI:  Pt is a 89 y.o. female seen today for medical management of chronic diseases.    She currently resides on the skilled nursing unit at [facility]. PMH: HTN, migraines, rectal bleeding, hypothyroidism, s/p thyroidectomy (left) 2017, dementia, osteoporosis, constipation, h/o breast and skin cancer, abnormal gait and insomnia.    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   Hypertension    Insomnia    Knee pain, bilateral 2014   Lymphedema    Left arm following mastectomy   Memory impairment    Memory loss    Migraine 05/16/2012   Migraine without aura, without mention of intractable migraine without mention of status migrainosus    patient denies at preop of 08/08/16    Other and unspecified hyperlipidemia    Other bursitis disorders  Other infective bursitis, left shoulder    Pain in joint, ankle and foot    Pain in joint, hand    Pain in joint, lower leg    Pain in joint, shoulder region    Pain in joint, shoulder region 04/23/2016   Pain in limb    Rectal bleeding 08/20/2016   Senile osteoporosis    Symptomatic menopausal or female climacteric states    Syncope and collapse    Unspecified hypertensive heart disease without heart failure 1995    Unspecified hypothyroidism    Unspecified polyarthropathy or polyarthritis, site unspecified    Unspecified urinary incontinence    Unspecified visual loss    Xerophthalmia    Past Surgical History:  Procedure Laterality Date   ABDOMINAL HYSTERECTOMY     BILATERAL OOPHORECTOMY  1981   BREAST RECONSTRUCTION Left 01/2000   BREAST REDUCTION SURGERY Right 01/2000   CATARACT EXTRACTION Right 09/04/2005   COLONOSCOPY  01/2004   MASTECTOMY Left 10/1999   THYROID LOBECTOMY Left 08/14/2016   Procedure: NEAR TOTAL THYROIDECTOMY;  Surgeon: Luretha Murphy, MD;  Location: [facility];  Service: General;  Laterality: Left;   TOTAL ABDOMINAL HYSTERECTOMY W/ BILATERAL SALPINGOOPHORECTOMY  1981   TOTAL KNEE ARTHROPLASTY Right 2014    Allergies  Allergen Reactions   Donepezil Hcl     Body pain and stiffness    Namenda [Memantine Hcl]      Caused body pain    Sulfa Antibiotics    Sulfamethoxazole Rash    Allergies as of 10/20/2023       Reactions   Donepezil Hcl    Body pain and stiffness   Namenda [memantine Hcl]     Caused body pain    Sulfa Antibiotics    Sulfamethoxazole Rash        Medication List        Accurate as of October 20, 2023  9:41 AM. If you have any questions, ask your nurse or doctor.          acetaminophen 325 MG tablet Commonly known as: TYLENOL Take 650 mg by mouth 3 (three) times daily.   amLODipine 10 MG tablet Commonly known as: NORVASC Take 1 tablet (10 mg total) by mouth daily.   Biofreeze 4 % Gel Generic drug: Menthol (Topical Analgesic) Apply 1 Application topically as needed. Apply to lower legs topically as needed for pain.   Cholecalciferol 125 MCG (5000 UT) capsule Take 5,000 Units by mouth daily.   diclofenac Sodium 1 % Gel Commonly known as: VOLTAREN Apply 2 g topically daily at 12 noon.   HM Lidocaine Patch 4 % Generic drug: lidocaine Place 2 patches onto the skin daily. Apply to lower back topically one time a day.   levothyroxine  112 MCG tablet Commonly known as: SYNTHROID Take 112 mcg by mouth daily before breakfast.   losartan 100 MG tablet Commonly known as: COZAAR Take 100 mg by mouth daily.   melatonin 5 MG Tabs Take 1 tablet (5 mg total) by mouth at bedtime.   OMEGA-3 CF PO Take 1,000 mg by mouth every morning. With food   oxybutynin 15 MG 24 hr tablet Commonly known as: DITROPAN XL Take 15 mg by mouth at bedtime.   Polyethyl Glycol-Propyl Glycol 0.4-0.3 % Soln Place 2 drops into both eyes daily as needed (dry eyes).   Reguloid 48.57 % Powd Generic drug: Psyllium Take by mouth daily. 1tsp   Senna S 8.6-50 MG tablet Generic drug: senna-docusate Take 2 tablets by mouth  daily.   Skin Prep Wipes Misc 2 Applications by Does not apply route 2 (two) times daily. Apply to heels topically for skin care treatment.   Therems-M Tabs Take 1 tablet by mouth every morning.   triamcinolone 0.025 % cream Commonly known as: KENALOG Apply 1 application  topically as needed (Apply to affected area topically).   zinc oxide 20 % ointment Apply 1 application  topically as needed for irritation. Apply to buttocks topically after every incontinent episode for redness        Review of Systems  Unable to perform ROS: Dementia    Immunization History  Administered Date(s) Administered   Fluad Quad(high Dose 65+) 10/02/2022   Influenza, High Dose Seasonal PF 09/17/2017, 09/10/2018, 09/22/2019, 09/13/2021, 10/08/2023   Influenza, Quadrivalent, Recombinant, Inj, Pf 08/29/2020   Influenza,inj,Quad PF,6+ Mos 09/10/2018   Influenza-Unspecified 08/10/2013, 09/07/2015, 09/13/2016   Moderna Covid-19 Vaccine Bivalent Booster 48yrs & up 05/22/2022   Moderna SARS-COV2 Booster Vaccination 05/16/2021   Moderna Sars-Covid-2 Vaccination 12/13/2019, 01/10/2020, 10/23/2020   PFIZER(Purple Top)SARS-COV-2 Vaccination 08/29/2021   Pfizer Covid-19 Vaccine Bivalent Booster 15yrs & up 10/15/2022   Pneumococcal Conjugate-13  06/08/2017, 11/02/2018   Pneumococcal Polysaccharide-23 12/09/2002, 06/08/2017, 10/22/2017, 11/02/2018   Pneumococcal-Unspecified 06/08/2017, 11/02/2018   Tdap 02/21/2014, 10/29/2017   Zoster Recombinant(Shingrix) 07/18/2017, 01/10/2018   Zoster, Live 07/18/2017, 01/10/2018   Pertinent  Health Maintenance Due  Topic Date Due   INFLUENZA VACCINE  Completed   DEXA SCAN  Completed      11/18/2022    2:34 PM 12/16/2022   10:20 AM 02/21/2023    2:43 PM 03/18/2023    2:13 PM 07/18/2023    3:01 PM  Fall Risk  Falls in the past year? 0 0 1 0 1  Was there an injury with Fall? 0 0 0 0 0  Fall Risk Category Calculator 0 0 2 0 1  Fall Risk Category (Retired) Low Low     (RETIRED) Patient Fall Risk Level Moderate fall risk Moderate fall risk     Patient at Risk for Falls Due to History of fall(s) History of fall(s) History of fall(s);Impaired balance/gait History of fall(s);Impaired balance/gait History of fall(s);Impaired balance/gait;Impaired mobility  Patient at Risk for Falls Due to - Comments   H/o dementia    Fall risk Follow up Falls evaluation completed Falls evaluation completed Falls evaluation completed;Education provided;Falls prevention discussed Falls evaluation completed Falls evaluation completed;Education provided;Falls prevention discussed   Functional Status Survey:    Vitals:   10/20/23 0929  BP: (!) 160/83  Pulse: 70  Resp: 20  Temp: (!) 96.8 F (36 C)  SpO2: 95%  Weight: 177 lb 12.8 oz (80.6 kg)  Height: 5\' 4"  (1.626 m)   Body mass index is 30.52 kg/m. Physical Exam Vitals reviewed.  Constitutional:      General: She is not in acute distress. HENT:     Head: Normocephalic.     Right Ear: There is no impacted cerumen.     Left Ear: There is no impacted cerumen.     Nose: Nose normal.     Mouth/Throat:     Mouth: Mucous membranes are moist.  Eyes:     General:        Right eye: No discharge.        Left eye: No discharge.  Cardiovascular:     Rate and  Rhythm: Normal rate and regular rhythm.     Pulses: Normal pulses.     Heart sounds: Normal heart sounds.  Pulmonary:     Effort: Pulmonary effort is normal.     Breath sounds: Normal breath sounds.  Abdominal:     General: Bowel sounds are normal.     Palpations: Abdomen is soft.  Musculoskeletal:     Cervical back: Neck supple.     Right lower leg: No edema.     Left lower leg: No edema.  Skin:    General: Skin is warm.     Capillary Refill: Capillary refill takes less than 2 seconds.  Neurological:     General: No focal deficit present.     Mental Status: She is alert. Mental status is at baseline.     Motor: Weakness present.     Gait: Gait abnormal.     Comments: Wheelchair, 1+ assist transfer  Psychiatric:        Mood and Affect: Mood normal.     Labs reviewed: Recent Labs    02/20/23 0000 03/27/23 0000 09/12/23 0000  NA 131* 133* 133*  K 4.5 4.5 4.5  CL 98* 100 98*  CO2 26* 27* 22  BUN 18 18 16   CREATININE 0.7 0.7 0.7  CALCIUM 9.2 9.5 9.3   Recent Labs    09/12/23 0000  AST 14  ALT 10  ALKPHOS 68  ALBUMIN 3.9   Recent Labs    06/10/23 0000 09/12/23 0000  WBC 8.4 6.2  NEUTROABS  --  3,900.00  HGB 13.9 11.9*  HCT 42 37  PLT 363 445*   Lab Results  Component Value Date   TSH 15.93 (A) 09/12/2023   No results found for: "HGBA1C" Lab Results  Component Value Date   CHOL 190 07/26/2020   HDL 56 07/26/2020   LDLCALC 109 (H) 07/26/2020   TRIG 139 07/26/2020   CHOLHDL 3.4 07/26/2020    Significant Diagnostic Results in last 30 days:  No results found.  Assessment/Plan 1. Essential hypertension - controlled, goal < 150/90 - cont amlodipine  2. 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 Chronic right-sided low back pain without sciatica - ongoing - cont tylenol, lidocaine  patch, voltaren and biofreeze  4. Senile osteoporosis - cont Vitamin D - do not recommend future DEXA studies due to worsening mobility  5. Mixed stress and urge urinary incontinence - ongoing - cont oxybutynin - cont skilled nursing   6. Postoperative hypothyroidism - TSH > 15 10/04 - levothyroxine scheduled at 6 am  - recheck TSH in 6 weeks > scheduled 11/14    Family/ staff Communication: plan discussed with patient and nurse  Labs/tests ordered:  TSH 11/14

## 2023-10-23 DIAGNOSIS — E039 Hypothyroidism, unspecified: Secondary | ICD-10-CM | POA: Diagnosis not present

## 2023-10-24 LAB — TSH: TSH: 4.68 (ref 0.41–5.90)

## 2023-11-17 ENCOUNTER — Encounter: Payer: Self-pay | Admitting: Orthopedic Surgery

## 2023-11-17 ENCOUNTER — Non-Acute Institutional Stay (SKILLED_NURSING_FACILITY): Payer: PPO | Admitting: Orthopedic Surgery

## 2023-11-17 DIAGNOSIS — N3946 Mixed incontinence: Secondary | ICD-10-CM | POA: Diagnosis not present

## 2023-11-17 DIAGNOSIS — F0390 Unspecified dementia without behavioral disturbance: Secondary | ICD-10-CM

## 2023-11-17 DIAGNOSIS — I10 Essential (primary) hypertension: Secondary | ICD-10-CM

## 2023-11-17 DIAGNOSIS — H6121 Impacted cerumen, right ear: Secondary | ICD-10-CM | POA: Diagnosis not present

## 2023-11-17 DIAGNOSIS — E890 Postprocedural hypothyroidism: Secondary | ICD-10-CM

## 2023-11-17 DIAGNOSIS — G8929 Other chronic pain: Secondary | ICD-10-CM | POA: Diagnosis not present

## 2023-11-17 DIAGNOSIS — M545 Low back pain, unspecified: Secondary | ICD-10-CM | POA: Diagnosis not present

## 2023-11-17 DIAGNOSIS — M810 Age-related osteoporosis without current pathological fracture: Secondary | ICD-10-CM

## 2023-11-17 MED ORDER — DEBROX 6.5 % OT SOLN
5.0000 [drp] | Freq: Every day | OTIC | Status: AC
Start: 2023-11-17 — End: 2023-11-22

## 2023-11-17 NOTE — Progress Notes (Signed)
Location:   [facility] Room Number: 25-A Place of Service:  SNF [phone]) Provider:  Hazle Nordmann, NP  PCP: [name], MD  Patient Care Team: [name], MD as PCP - General (Internal Medicine) [name], MD as Consulting Physician (Endocrinology)  Extended Emergency Contact Information Primary Emergency Contact: Mclaine,Gordon Address: [address]          [place], [place] [address] Darden Amber of [place] Home Phone: [phone] Mobile Phone: [phone] Relation: Spouse Secondary Emergency Contact: [name],[name] Address: [address]          [place], [place] [address] Darden Amber of [place] Home Phone: [phone] Work Phone: [phone] Mobile Phone: [phone] Relation: Son  Code Status:  DNR Goals of care: Advanced Directive information    11/17/2023   10:54 AM  Advanced Directives  Does Patient Have a Medical Advance Directive? Yes  Type of Estate agent of [place];Living will;Out of facility DNR (pink MOST or yellow form)  Does patient want to make changes to medical advance directive? No - Patient declined  Copy of Healthcare Power of Attorney in Chart? Yes - validated most recent copy scanned in chart (See row information)     Chief Complaint  Patient presents with   Medical Management of Chronic Issues    Routine Visit.    Immunizations    Discuss the need for [employer].     HPI:  Pt is a 89 y.o. female seen today for medical management of chronic diseases.    She currently resides on the skilled nursing unit at [facility]. PMH: HTN, migraines, rectal bleeding, hypothyroidism, s/p thyroidectomy (left) 2017, dementia, osteoporosis, constipation, h/o breast and skin cancer, abnormal gait and insomnia.    Dementia- MMSE 23/30 02/2022, BIMS score 12/15 (09/11)> was  11/15 (06/10), no behavioral outbursts, dependent with ADLs except feeding, unable to tolerate  Namenda/Aricept in past/ rash with Exelon patch, worsened speech/aphasia within past 2 months, not on medication HTN- BUN/creat 16/0.7 09/12/2023, remains on losartan and amlodipine Chronic right sided pain- past xrays suggest 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, remains on tylenol, voltaren gel and lidocaine patches Senile osteoporosis- DEXA 09/2021, remains on vitamin D Urinary incontinence- stable with oxybutynin Hypothyroidism- s/p left lobe thyroidectomy 2017, TSH 15.93 (10/04), levothyroxine administration time changed to 7AM, repeat TSH 4.68 (11/15)  No recent falls or injuries. Ambulates with wheelchair, 1+ assist with transfers.   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   Hypertension    Insomnia    Knee pain, bilateral 2014   Lymphedema    Left arm following mastectomy   Memory impairment    Memory loss    Migraine 05/16/2012   Migraine without aura, without mention of intractable migraine without mention of status migrainosus    patient denies at preop of 08/08/16    Other and unspecified hyperlipidemia    Other bursitis disorders  Other infective bursitis, left shoulder    Pain in joint, ankle and foot    Pain in joint, hand    Pain in joint, lower leg    Pain in joint, shoulder region    Pain in joint, shoulder region 04/23/2016   Pain in limb    Rectal bleeding 08/20/2016   Senile osteoporosis    Symptomatic menopausal or female climacteric states    Syncope and collapse    Unspecified hypertensive heart disease  without heart failure 1995   Unspecified hypothyroidism    Unspecified polyarthropathy or polyarthritis, site unspecified    Unspecified urinary incontinence    Unspecified visual loss    Xerophthalmia    Past Surgical History:  Procedure Laterality Date   ABDOMINAL HYSTERECTOMY     BILATERAL OOPHORECTOMY  1981   BREAST RECONSTRUCTION Left 01/2000   BREAST REDUCTION SURGERY Right 01/2000   CATARACT EXTRACTION Right 09/04/2005   COLONOSCOPY  01/2004   MASTECTOMY Left 10/1999   THYROID LOBECTOMY Left 08/14/2016   Procedure: NEAR TOTAL THYROIDECTOMY;  Surgeon: Luretha Murphy, MD;  Location: [facility];  Service: General;  Laterality: Left;   TOTAL ABDOMINAL HYSTERECTOMY W/ BILATERAL SALPINGOOPHORECTOMY  1981   TOTAL KNEE ARTHROPLASTY Right 2014    Allergies  Allergen Reactions   Donepezil Hcl     Body pain and stiffness    Namenda [Memantine Hcl]      Caused body pain    Sulfa Antibiotics    Sulfamethoxazole Rash    Allergies as of 11/17/2023       Reactions   Donepezil Hcl    Body pain and stiffness   Namenda [memantine Hcl]     Caused body pain    Sulfa Antibiotics    Sulfamethoxazole Rash        Medication List        Accurate as of November 17, 2023 10:55 AM. If you have any questions, ask your nurse or doctor.          acetaminophen 325 MG tablet Commonly known as: TYLENOL Take 650 mg by mouth 3 (three) times daily.   amLODipine 10 MG tablet Commonly known as: NORVASC Take 1 tablet (10 mg total) by mouth daily.   Biofreeze 4 % Gel Generic drug: Menthol (Topical Analgesic) Apply 1 Application topically as needed. Apply to lower legs topically as needed for pain.   Cholecalciferol 125 MCG (5000 UT) capsule Take 5,000 Units by mouth daily.   diclofenac Sodium 1 % Gel Commonly known as: VOLTAREN Apply 2 g topically daily at 12 noon.   HM Lidocaine Patch 4 % Generic drug: lidocaine Place 2 patches onto the skin daily. Apply to lower back topically one  time a day.   levothyroxine 112 MCG tablet Commonly known as: SYNTHROID Take 112 mcg by mouth daily before breakfast.   losartan 100 MG tablet Commonly known as: COZAAR Take 100 mg by mouth daily.   melatonin 5 MG Tabs Take 1 tablet (5 mg total) by mouth at bedtime.   OMEGA-3 CF PO Take 1,000 mg by mouth every morning. With food   oxybutynin 15 MG 24 hr tablet Commonly known as: DITROPAN XL Take 15 mg by mouth at bedtime.   Polyethyl Glycol-Propyl Glycol 0.4-0.3 % Soln Place 2 drops into both eyes daily as needed (dry eyes).   Reguloid 48.57 % Powd Generic drug: Psyllium Take by mouth daily. 1tsp   Senna S 8.6-50 MG tablet Generic drug: senna-docusate Take 2 tablets by mouth daily.  Skin Prep Wipes Misc 2 Applications by Does not apply route as directed. Apply to heels topically for skin care treatment.   Therems-M Tabs Take 1 tablet by mouth every morning.   triamcinolone 0.025 % cream Commonly known as: KENALOG Apply 1 application  topically as needed (Apply to affected area topically).   zinc oxide 20 % ointment Apply 1 application  topically as needed for irritation. Apply to buttocks topically after every incontinent episode for redness        Review of Systems  Unable to perform ROS: Dementia    Immunization History  Administered Date(s) Administered   Fluad Quad(high Dose 65+) 10/02/2022   Influenza, High Dose Seasonal PF 09/17/2017, 09/10/2018, 09/22/2019, 09/13/2021, 10/08/2023   Influenza, Quadrivalent, Recombinant, Inj, Pf 08/29/2020   Influenza,inj,Quad PF,6+ Mos 09/10/2018   Influenza-Unspecified 08/10/2013, 09/07/2015, 09/13/2016   Moderna Covid-19 Vaccine Bivalent Booster 59yrs & up 05/22/2022   Moderna SARS-COV2 Booster Vaccination 05/16/2021   Moderna Sars-Covid-2 Vaccination 12/13/2019, 01/10/2020, 10/23/2020   PFIZER(Purple Top)SARS-COV-2 Vaccination 08/29/2021   Pfizer Covid-19 Vaccine Bivalent Booster 89yrs & up 10/15/2022    Pneumococcal Conjugate-13 06/08/2017, 11/02/2018   Pneumococcal Polysaccharide-23 12/09/2002, 06/08/2017, 10/22/2017, 11/02/2018   Pneumococcal-Unspecified 06/08/2017, 11/02/2018   Tdap 02/21/2014, 10/29/2017   Zoster Recombinant(Shingrix) 07/18/2017, 01/10/2018   Zoster, Live 07/18/2017, 01/10/2018   Pertinent  Health Maintenance Due  Topic Date Due   INFLUENZA VACCINE  Completed   DEXA SCAN  Completed      11/18/2022    2:34 PM 12/16/2022   10:20 AM 02/21/2023    2:43 PM 03/18/2023    2:13 PM 07/18/2023    3:01 PM  Fall Risk  Falls in the past year? 0 0 1 0 1  Was there an injury with Fall? 0 0 0 0 0  Fall Risk Category Calculator 0 0 2 0 1  Fall Risk Category (Retired) Low Low     (RETIRED) Patient Fall Risk Level Moderate fall risk Moderate fall risk     Patient at Risk for Falls Due to History of fall(s) History of fall(s) History of fall(s);Impaired balance/gait History of fall(s);Impaired balance/gait History of fall(s);Impaired balance/gait;Impaired mobility  Patient at Risk for Falls Due to - Comments   H/o dementia    Fall risk Follow up Falls evaluation completed Falls evaluation completed Falls evaluation completed;Education provided;Falls prevention discussed Falls evaluation completed Falls evaluation completed;Education provided;Falls prevention discussed   Functional Status Survey:    Vitals:   11/17/23 1049  BP: (!) 146/83  Pulse: 72  Resp: 12  Temp: (!) 96.2 F (35.7 C)  SpO2: 98%  Weight: 175 lb 1.6 oz (79.4 kg)  Height: 5\' 4"  (1.626 m)   Body mass index is 30.06 kg/m. Physical Exam Vitals reviewed.  Constitutional:      General: She is not in acute distress. HENT:     Head: Normocephalic.     Right Ear: There is impacted cerumen.     Left Ear: There is no impacted cerumen.     Nose: Nose normal.     Mouth/Throat:     Mouth: Mucous membranes are moist.  Eyes:     General:        Right eye: No discharge.        Left eye: No discharge.  Neck:      Vascular: No carotid bruit.  Cardiovascular:     Rate and Rhythm: Normal rate and regular rhythm.     Pulses: Normal pulses.     Heart sounds:  Normal heart sounds.  Pulmonary:     Effort: Pulmonary effort is normal. No respiratory distress.     Breath sounds: Normal breath sounds. No wheezing.  Abdominal:     General: Bowel sounds are normal. There is no distension.     Palpations: Abdomen is soft.     Tenderness: There is no abdominal tenderness.  Musculoskeletal:     Cervical back: Neck supple.     Right lower leg: No edema.     Left lower leg: No edema.  Skin:    General: Skin is warm.     Capillary Refill: Capillary refill takes less than 2 seconds.  Neurological:     General: No focal deficit present.     Mental Status: She is alert. Mental status is at baseline.     Motor: Weakness present.     Gait: Gait abnormal.     Comments: wheelchair  Psychiatric:        Mood and Affect: Mood normal.     Comments: Aphasia, follows commands, alert to self/familiar face     Labs reviewed: Recent Labs    02/20/23 0000 03/27/23 0000 09/12/23 0000  NA 131* 133* 133*  K 4.5 4.5 4.5  CL 98* 100 98*  CO2 26* 27* 22  BUN 18 18 16   CREATININE 0.7 0.7 0.7  CALCIUM 9.2 9.5 9.3   Recent Labs    09/12/23 0000  AST 14  ALT 10  ALKPHOS 68  ALBUMIN 3.9   Recent Labs    06/10/23 0000 09/12/23 0000  WBC 8.4 6.2  NEUTROABS  --  3,900.00  HGB 13.9 11.9*  HCT 42 37  PLT 363 445*   Lab Results  Component Value Date   TSH 4.68 10/24/2023   No results found for: "HGBA1C" Lab Results  Component Value Date   CHOL 190 07/26/2020   HDL 56 07/26/2020   LDLCALC 109 (H) 07/26/2020   TRIG 139 07/26/2020   CHOLHDL 3.4 07/26/2020    Significant Diagnostic Results in last 30 days:  No results found.  Assessment/Plan 1. Right ear impacted cerumen - start debrox> 5 gtts to right ear at bedtime x 5 days - flush ear debrox when complete  2. Dementia without behavioral  disturbance (HCC) - no behaviors - worsening aphasia - weight stable - ambulates with wheelchair - unable to tolerate Aricept/Namenda/ Exelon - cont skilled nursing   3. Essential hypertension - controlled with losartan and amlodipine  4. Chronic right-sided low back pain without sciatica - ongoing - no recent falls  - cont tylenol, voltaren gel and lidocaine patches  5. Senile osteoporosis - cont Vitamin D  6. Mixed stress and urge urinary incontinence - stable with oxybutynin  7. Postoperative hypothyroidism - TSH improved to 4.68 10/24/2023 - suspect not being administered on empty stomach - cont levothyroxine administration at 7 am    Family/ staff Communication: plan discussed with patient and nurse  Labs/tests ordered:  none

## 2023-11-28 DIAGNOSIS — H40053 Ocular hypertension, bilateral: Secondary | ICD-10-CM | POA: Diagnosis not present

## 2023-12-30 DIAGNOSIS — M79672 Pain in left foot: Secondary | ICD-10-CM | POA: Diagnosis not present

## 2023-12-30 DIAGNOSIS — L602 Onychogryphosis: Secondary | ICD-10-CM | POA: Diagnosis not present

## 2023-12-30 DIAGNOSIS — M79671 Pain in right foot: Secondary | ICD-10-CM | POA: Diagnosis not present

## 2024-01-02 ENCOUNTER — Encounter: Payer: Self-pay | Admitting: Orthopedic Surgery

## 2024-01-02 ENCOUNTER — Non-Acute Institutional Stay (SKILLED_NURSING_FACILITY): Payer: Self-pay | Admitting: Orthopedic Surgery

## 2024-01-02 DIAGNOSIS — F0390 Unspecified dementia without behavioral disturbance: Secondary | ICD-10-CM | POA: Diagnosis not present

## 2024-01-02 DIAGNOSIS — M79604 Pain in right leg: Secondary | ICD-10-CM | POA: Diagnosis not present

## 2024-01-02 DIAGNOSIS — N3946 Mixed incontinence: Secondary | ICD-10-CM | POA: Diagnosis not present

## 2024-01-02 DIAGNOSIS — I10 Essential (primary) hypertension: Secondary | ICD-10-CM | POA: Diagnosis not present

## 2024-01-02 DIAGNOSIS — E890 Postprocedural hypothyroidism: Secondary | ICD-10-CM | POA: Diagnosis not present

## 2024-01-02 DIAGNOSIS — M810 Age-related osteoporosis without current pathological fracture: Secondary | ICD-10-CM | POA: Diagnosis not present

## 2024-01-02 NOTE — Progress Notes (Signed)
Location:  [facility] Room Number: 25/A Place of Service:  SNF [id number]) Provider:  Octavia Heir, NP   [name], MD  Patient Care Team: [name], MD as PCP - General (Internal Medicine) [name], MD as Consulting Physician (Endocrinology)  Extended Emergency Contact Information Primary Emergency Contact: Polzin,Gordon Address: [address]          [place], [place] [address] Darden [place] of [place] Home Phone: [phone] Mobile Phone: [phone] Relation: Spouse Secondary Emergency Contact: [name],[name] Address: [address]          [place], [place] [address] Darden Amber of [place] Home Phone: [phone] Work Phone: [phone] Mobile Phone: [phone] Relation: Son  Code Status:  DNR Goals of care: Advanced Directive information    11/17/2023   10:54 AM  Advanced Directives  Does Patient Have a Medical Advance Directive? Yes  Type of Estate agent of [place];Living will;Out of facility DNR (pink MOST or yellow form)  Does patient want to make changes to medical advance directive? No - Patient declined  Copy of Healthcare Power of Attorney in Chart? Yes - validated most recent copy scanned in chart (See row information)     Chief Complaint  Patient presents with   Medical Management of Chronic Issues    HPI:  Pt is a 89 y.o. female seen today for medical management of chronic diseases.    She currently resides on the skilled nursing unit at [facility]. PMH: HTN, migraines, rectal bleeding, hypothyroidism, s/p thyroidectomy (left) 2017, dementia, osteoporosis, constipation, h/o breast and skin cancer, abnormal gait and insomnia.    Dementia- MMSE 23/30 02/2022, BIMS score 5/15 (12/10)> was 12/15 (09/11)> was 11/15 (06/10), no behavioral outbursts, dependent with ADLs except feeding, unable to tolerate Namenda/Aricept in past/ rash with Exelon patch, not on medication HTN-  BUN/creat 16/0.7 09/12/2023, remains on losartan and amlodipine Chronic right leg pain- includes right hip and knee, past xrays suggest 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, remains on tylenol, voltaren gel and lidocaine patches Senile osteoporosis- DEXA 09/2021, remains on vitamin D Urinary incontinence- stable with oxybutynin Hypothyroidism- s/p left lobe thyroidectomy 2017, TSH 4.68 10/27/2023, remains on levothyroxine  Recent blood pressures:  01/21- 137/89  01/14- 157/76  01/07- 145/74  Recent weights:  01/01- 175.1 lbs  12/01- 175.1 lbs  11/02- 177.8 lbs        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   Hypertension    Insomnia    Knee pain, bilateral 2014   Lymphedema    Left arm following mastectomy   Memory impairment    Memory loss    Migraine 05/16/2012   Migraine without aura, without mention of intractable migraine without mention of status migrainosus    patient denies at preop of 08/08/16    Other and unspecified hyperlipidemia    Other bursitis disorders    Other infective bursitis, left shoulder    Pain in joint, ankle and foot    Pain in joint, hand    Pain in joint, lower leg    Pain  in joint, shoulder region    Pain in joint, shoulder region 04/23/2016   Pain in limb    Rectal bleeding 08/20/2016   Senile osteoporosis    Symptomatic menopausal or female climacteric states    Syncope and collapse    Unspecified hypertensive heart disease without heart failure 1995   Unspecified hypothyroidism    Unspecified polyarthropathy or polyarthritis, site unspecified    Unspecified urinary incontinence    Unspecified visual loss    Xerophthalmia    Past  Surgical History:  Procedure Laterality Date   ABDOMINAL HYSTERECTOMY     BILATERAL OOPHORECTOMY  1981   BREAST RECONSTRUCTION Left 01/2000   BREAST REDUCTION SURGERY Right 01/2000   CATARACT EXTRACTION Right 09/04/2005   COLONOSCOPY  01/2004   MASTECTOMY Left 10/1999   THYROID LOBECTOMY Left 08/14/2016   Procedure: NEAR TOTAL THYROIDECTOMY;  Surgeon: Luretha Murphy, MD;  Location: [facility];  Service: General;  Laterality: Left;   TOTAL ABDOMINAL HYSTERECTOMY W/ BILATERAL SALPINGOOPHORECTOMY  1981   TOTAL KNEE ARTHROPLASTY Right 2014    Allergies  Allergen Reactions   Donepezil Hcl     Body pain and stiffness    Namenda [Memantine Hcl]      Caused body pain    Sulfa Antibiotics    Sulfamethoxazole Rash    Outpatient Encounter Medications as of 01/02/2024  Medication Sig   acetaminophen (TYLENOL) 325 MG tablet Take 650 mg by mouth 3 (three) times daily.   amLODipine (NORVASC) 10 MG tablet Take 1 tablet (10 mg total) by mouth daily.   Cholecalciferol 5000 units capsule Take 5,000 Units by mouth daily.   diclofenac Sodium (VOLTAREN) 1 % GEL Apply 2 g topically daily at 12 noon.   levothyroxine (SYNTHROID) 112 MCG tablet Take 112 mcg by mouth daily before breakfast.   lidocaine (HM LIDOCAINE PATCH) 4 % Place 2 patches onto the skin daily. Apply to lower back topically one time a day.   losartan (COZAAR) 100 MG tablet Take 100 mg by mouth daily.   melatonin 5 MG TABS Take 1 tablet (5 mg total) by mouth at bedtime.   Menthol, Topical Analgesic, (BIOFREEZE) 4 % GEL Apply 1 Application topically as needed. Apply to lower legs topically as needed for pain.   Multiple Vitamins-Minerals (THEREMS-M) TABS Take 1 tablet by mouth every morning.   Omega-3 Fatty Acids (OMEGA-3 CF PO) Take 1,000 mg by mouth every morning. With food   Ostomy Supplies (SKIN PREP WIPES) MISC 2 Applications by Does not apply route as directed. Apply to heels topically for skin care treatment.   oxybutynin (DITROPAN XL) 15  MG 24 hr tablet Take 15 mg by mouth at bedtime.   Polyethyl Glycol-Propyl Glycol 0.4-0.3 % SOLN Place 2 drops into both eyes daily as needed (dry eyes).    Psyllium (REGULOID) 48.57 % POWD Take by mouth daily. 1tsp   senna-docusate (SENNA S) 8.6-50 MG tablet Take 2 tablets by mouth daily.   triamcinolone (KENALOG) 0.025 % cream Apply 1 application  topically as needed (Apply to affected area topically).   zinc oxide 20 % ointment Apply 1 application  topically as needed for irritation. Apply to buttocks topically after every incontinent episode for redness   No facility-administered encounter medications on file as of 01/02/2024.    Review of Systems  Unable to perform ROS: Dementia    Immunization History  Administered Date(s) Administered   Fluad Quad(high Dose 65+) 10/02/2022   Influenza, High Dose Seasonal PF 09/17/2017, 09/10/2018, 09/22/2019, 09/13/2021,  10/08/2023   Influenza, Quadrivalent, Recombinant, Inj, Pf 08/29/2020   Influenza,inj,Quad PF,6+ Mos 09/10/2018   Influenza-Unspecified 08/10/2013, 09/07/2015, 09/13/2016   Moderna Covid-19 Vaccine Bivalent Booster 67yrs & up 05/22/2022   Moderna SARS-COV2 Booster Vaccination 05/16/2021   Moderna Sars-Covid-2 Vaccination 12/13/2019, 01/10/2020, 10/23/2020   PFIZER(Purple Top)SARS-COV-2 Vaccination 08/29/2021   Pfizer Covid-19 Vaccine Bivalent Booster 58yrs & up 10/15/2022   Pneumococcal Conjugate-13 06/08/2017, 11/02/2018   Pneumococcal Polysaccharide-23 12/09/2002, 06/08/2017, 10/22/2017, 11/02/2018   Pneumococcal-Unspecified 06/08/2017, 11/02/2018   Tdap 02/21/2014, 10/29/2017   Zoster Recombinant(Shingrix) 07/18/2017, 01/10/2018   Zoster, Live 07/18/2017, 01/10/2018   Pertinent  Health Maintenance Due  Topic Date Due   OPHTHALMOLOGY EXAM  05/28/2024   INFLUENZA VACCINE  Completed   DEXA SCAN  Completed      12/16/2022   10:20 AM 02/21/2023    2:43 PM 03/18/2023    2:13 PM 07/18/2023    3:01 PM 11/17/2023   12:29 PM  Fall  Risk  Falls in the past year? 0 1 0 1 0  Was there an injury with Fall? 0 0 0 0 0  Fall Risk Category Calculator 0 2 0 1 0  Fall Risk Category (Retired) Low      (RETIRED) Patient Fall Risk Level Moderate fall risk      Patient at Risk for Falls Due to History of fall(s) History of fall(s);Impaired balance/gait History of fall(s);Impaired balance/gait History of fall(s);Impaired balance/gait;Impaired mobility History of fall(s);Impaired balance/gait;Impaired mobility  Patient at Risk for Falls Due to - Comments  H/o dementia     Fall risk Follow up Falls evaluation completed Falls evaluation completed;Education provided;Falls prevention discussed Falls evaluation completed Falls evaluation completed;Education provided;Falls prevention discussed Falls evaluation completed;Education provided;Falls prevention discussed   Functional Status Survey:    Vitals:   01/02/24 1024  BP: 137/89  Pulse: 85  Resp: 20  Temp: (!) 96.6 F (35.9 C)  SpO2: 94%  Weight: 175 lb 1.6 oz (79.4 kg)   Body mass index is 30.06 kg/m. Physical Exam Vitals reviewed.  Constitutional:      General: She is not in acute distress. HENT:     Head: Normocephalic.  Eyes:     General:        Right eye: No discharge.        Left eye: No discharge.  Cardiovascular:     Rate and Rhythm: Normal rate and regular rhythm.     Pulses: Normal pulses.     Heart sounds: Normal heart sounds.  Pulmonary:     Effort: Pulmonary effort is normal. No respiratory distress.     Breath sounds: Normal breath sounds. No wheezing.  Abdominal:     General: Bowel sounds are normal. There is no distension.     Palpations: Abdomen is soft.     Tenderness: There is no abdominal tenderness.  Musculoskeletal:     Cervical back: Neck supple.     Right lower leg: No edema.     Left lower leg: No edema.  Skin:    General: Skin is warm.     Capillary Refill: Capillary refill takes less than 2 seconds.  Neurological:     General: No  focal deficit present.     Mental Status: She is alert. Mental status is at baseline.     Motor: Weakness present.     Gait: Gait abnormal.  Psychiatric:        Mood and Affect: Mood normal.     Comments: Aphasia, follows commands  Labs reviewed: Recent Labs    02/20/23 0000 03/27/23 0000 09/12/23 0000  NA 131* 133* 133*  K 4.5 4.5 4.5  CL 98* 100 98*  CO2 26* 27* 22  BUN 18 18 16   CREATININE 0.7 0.7 0.7  CALCIUM 9.2 9.5 9.3   Recent Labs    09/12/23 0000  AST 14  ALT 10  ALKPHOS 68  ALBUMIN 3.9   Recent Labs    06/10/23 0000 09/12/23 0000  WBC 8.4 6.2  NEUTROABS  --  3,900.00  HGB 13.9 11.9*  HCT 42 37  PLT 363 445*   Lab Results  Component Value Date   TSH 4.68 10/24/2023   No results found for: "HGBA1C" Lab Results  Component Value Date   CHOL 190 07/26/2020   HDL 56 07/26/2020   LDLCALC 109 (H) 07/26/2020   TRIG 139 07/26/2020   CHOLHDL 3.4 07/26/2020    Significant Diagnostic Results in last 30 days:  No results found.  Assessment/Plan 1. Dementia without behavioral disturbance (HCC) (Primary) - progressed> worsening aphasia within past 3-4 months - recent BIMS score 5/15 (11/2023)> was 12/15 (08/2023) - no behaviors - dependent with ADLS except feeding - not on medication  2. Essential hypertension - controlled with losartan and amlodipine  3. Right leg pain - hip and knee most bothersome  - cont tylenol and voltaren gel  4. Senile osteoporosis - cont vitamin D   5. Mixed stress and urge urinary incontinence - cont oxybutynin  6. Postoperative hypothyroidism - TSH stable - cont levothyroxine     Family/ staff Communication: plan discussed with patient, husband and nurse  Labs/tests ordered:  none

## 2024-01-27 DIAGNOSIS — L570 Actinic keratosis: Secondary | ICD-10-CM | POA: Diagnosis not present

## 2024-01-27 DIAGNOSIS — L814 Other melanin hyperpigmentation: Secondary | ICD-10-CM | POA: Diagnosis not present

## 2024-01-27 DIAGNOSIS — L821 Other seborrheic keratosis: Secondary | ICD-10-CM | POA: Diagnosis not present

## 2024-01-29 ENCOUNTER — Non-Acute Institutional Stay (SKILLED_NURSING_FACILITY): Payer: Self-pay | Admitting: Internal Medicine

## 2024-01-29 DIAGNOSIS — G301 Alzheimer's disease with late onset: Secondary | ICD-10-CM

## 2024-01-29 DIAGNOSIS — N3946 Mixed incontinence: Secondary | ICD-10-CM | POA: Diagnosis not present

## 2024-01-29 DIAGNOSIS — F02B4 Dementia in other diseases classified elsewhere, moderate, with anxiety: Secondary | ICD-10-CM | POA: Diagnosis not present

## 2024-01-29 DIAGNOSIS — M79604 Pain in right leg: Secondary | ICD-10-CM

## 2024-01-29 DIAGNOSIS — I10 Essential (primary) hypertension: Secondary | ICD-10-CM

## 2024-01-29 DIAGNOSIS — E890 Postprocedural hypothyroidism: Secondary | ICD-10-CM

## 2024-01-29 NOTE — Progress Notes (Signed)
 [facility]  S[facility] (31)  Provider:   Code Status: DNR Goals of Care:     11/17/2023   10:54 AM  Advanced Directives  Does Patient Have a Medical Advance Directive? Yes  Type of Estate agent of [place];Living will;Out of facility DNR (pink MOST or yellow form)  Does patient want to make changes to medical advance directive? No - Patient declined  Copy of Healthcare Power of Attorney in Chart? Yes - validated most recent copy scanned in chart (See row information)     Chief Complaint  Patient presents with   Care Management    HPI: Patient is a 89 y.o. female seen today for medical management of chronic diseases.    Lives in SNF in [place] has h/o Hypertension, Hypothyroidism, Insomnia and Cognitive impairment. Also Has h/o Skin Cancer and Thyroid Cancer  Urinary Incontinence Failed Pessary Right hip Pain Has severe arthritis    She is stable. No new Nursing issues. No Behavior issues Her weight is stable Uses Wheelchair now  Cannot walk with her walker anymore due to her Arthritis Some pain in her right Leg Husband in room. 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   Hypertension    Insomnia    Knee pain, bilateral 2014   Lymphedema    Left arm following mastectomy   Memory impairment    Memory loss    Migraine 05/16/2012   Migraine without aura, without mention of intractable migraine without  mention of status migrainosus    patient denies at preop of 08/08/16    Other and unspecified hyperlipidemia    Other bursitis disorders    Other infective bursitis, left shoulder    Pain in joint, ankle and foot    Pain in joint, hand    Pain in joint, lower leg    Pain in joint, shoulder region    Pain in joint, shoulder region 04/23/2016   Pain in limb    Rectal bleeding 08/20/2016   Senile osteoporosis    Symptomatic menopausal or female climacteric states    Syncope and collapse    Unspecified hypertensive heart disease without heart failure 1995   Unspecified hypothyroidism    Unspecified polyarthropathy or polyarthritis, site unspecified    Unspecified urinary incontinence    Unspecified visual loss    Xerophthalmia     Past Surgical History:  Procedure Laterality Date   ABDOMINAL HYSTERECTOMY     BILATERAL OOPHORECTOMY  1981   BREAST RECONSTRUCTION Left 01/2000   BREAST REDUCTION SURGERY Right 01/2000   CATARACT EXTRACTION Right 09/04/2005   COLONOSCOPY  01/2004   MASTECTOMY Left 10/1999   THYROID LOBECTOMY Left 08/14/2016   Procedure: NEAR TOTAL THYROIDECTOMY;  Surgeon: Luretha Murphy, MD;  Location: [facility];  Service: General;  Laterality: Left;  TOTAL ABDOMINAL HYSTERECTOMY W/ BILATERAL SALPINGOOPHORECTOMY  1981   TOTAL KNEE ARTHROPLASTY Right 2014    Allergies  Allergen Reactions   Donepezil Hcl     Body pain and stiffness    Namenda [Memantine Hcl]      Caused body pain    Sulfa Antibiotics    Sulfamethoxazole Rash    Outpatient Encounter Medications as of 01/29/2024  Medication Sig   acetaminophen (TYLENOL) 325 MG tablet Take 650 mg by mouth 3 (three) times daily.   amLODipine (NORVASC) 10 MG tablet Take 1 tablet (10 mg total) by mouth daily.   Cholecalciferol 5000 units capsule Take 5,000 Units by mouth daily.   diclofenac Sodium (VOLTAREN) 1 % GEL Apply 2 g topically daily at 12 noon.   levothyroxine (SYNTHROID) 112 MCG tablet Take 112 mcg by mouth daily  before breakfast.   lidocaine (HM LIDOCAINE PATCH) 4 % Place 2 patches onto the skin daily. Apply to lower back topically one time a day.   losartan (COZAAR) 100 MG tablet Take 100 mg by mouth daily.   melatonin 5 MG TABS Take 1 tablet (5 mg total) by mouth at bedtime.   Menthol, Topical Analgesic, (BIOFREEZE) 4 % GEL Apply 1 Application topically as needed. Apply to lower legs topically as needed for pain.   Multiple Vitamins-Minerals (THEREMS-M) TABS Take 1 tablet by mouth every morning.   Omega-3 Fatty Acids (OMEGA-3 CF PO) Take 1,000 mg by mouth every morning. With food   Ostomy Supplies (SKIN PREP WIPES) MISC 2 Applications by Does not apply route as directed. Apply to heels topically for skin care treatment.   oxybutynin (DITROPAN XL) 15 MG 24 hr tablet Take 15 mg by mouth at bedtime.   Polyethyl Glycol-Propyl Glycol 0.4-0.3 % SOLN Place 2 drops into both eyes daily as needed (dry eyes).    Psyllium (REGULOID) 48.57 % POWD Take by mouth daily. 1tsp   senna-docusate (SENNA S) 8.6-50 MG tablet Take 2 tablets by mouth daily.   triamcinolone (KENALOG) 0.025 % cream Apply 1 application  topically as needed (Apply to affected area topically).   zinc oxide 20 % ointment Apply 1 application  topically as needed for irritation. Apply to buttocks topically after every incontinent episode for redness   No facility-administered encounter medications on file as of 01/29/2024.    Review of Systems:  Review of Systems  Constitutional:  Negative for activity change and appetite change.  HENT: Negative.    Respiratory:  Positive for cough. Negative for shortness of breath.   Cardiovascular:  Negative for leg swelling.  Gastrointestinal:  Negative for constipation.  Genitourinary: Negative.   Musculoskeletal:  Positive for arthralgias, gait problem and myalgias.  Skin: Negative.   Neurological:  Negative for dizziness and weakness.  Psychiatric/Behavioral:  Positive for confusion. Negative for  dysphoric mood and sleep disturbance.     Health Maintenance  Topic Date Due   COVID-19 Vaccine (7 - 2024-25 season) 08/10/2023   Medicare Annual Wellness (AWV)  02/21/2024   OPHTHALMOLOGY EXAM  05/28/2024   DTaP/Tdap/Td (3 - Td or Tdap) 10/30/2027   Pneumonia Vaccine 66+ Years old  Completed   INFLUENZA VACCINE  Completed   DEXA SCAN  Completed   Zoster Vaccines- Shingrix  Completed   HPV VACCINES  Aged Out    Physical Exam: Vitals:   01/29/24 2004  BP: (!) 159/69  Pulse: 72  Temp: (!) 96.4 F (35.8 C)  Weight: 176 lb (79.8 kg)   Body mass index is  30.21 kg/m. Physical Exam Vitals reviewed.  Constitutional:      Appearance: Normal appearance.  HENT:     Head: Normocephalic.     Nose: Nose normal.     Mouth/Throat:     Mouth: Mucous membranes are moist.     Pharynx: Oropharynx is clear.  Eyes:     Pupils: Pupils are equal, round, and reactive to light.  Cardiovascular:     Rate and Rhythm: Normal rate and regular rhythm.     Pulses: Normal pulses.     Heart sounds: Normal heart sounds. No murmur heard. Pulmonary:     Effort: Pulmonary effort is normal.     Breath sounds: Normal breath sounds.  Abdominal:     General: Abdomen is flat. Bowel sounds are normal.     Palpations: Abdomen is soft.  Musculoskeletal:        General: No swelling.     Cervical back: Neck supple.  Skin:    General: Skin is warm.  Neurological:     General: No focal deficit present.     Mental Status: She is alert.  Psychiatric:        Mood and Affect: Mood normal.        Thought Content: Thought content normal.     Labs reviewed: Basic Metabolic Panel: Recent Labs    02/20/23 0000 03/27/23 0000 09/12/23 0000 10/24/23 0000  NA 131* 133* 133*  --   K 4.5 4.5 4.5  --   CL 98* 100 98*  --   CO2 26* 27* 22  --   BUN 18 18 16   --   CREATININE 0.7 0.7 0.7  --   CALCIUM 9.2 9.5 9.3  --   TSH  --   --  15.93* 4.68   Liver Function Tests: Recent Labs    09/12/23 0000  AST  14  ALT 10  ALKPHOS 68  ALBUMIN 3.9   No results for input(s): "LIPASE", "AMYLASE" in the last 8760 hours. No results for input(s): "AMMONIA" in the last 8760 hours. CBC: Recent Labs    06/10/23 0000 09/12/23 0000  WBC 8.4 6.2  NEUTROABS  --  3,900.00  HGB 13.9 11.9*  HCT 42 37  PLT 363 445*   Lipid Panel: No results for input(s): "CHOL", "HDL", "LDLCALC", "TRIG", "CHOLHDL", "LDLDIRECT" in the last 8760 hours. No results found for: "HGBA1C"  Procedures since last visit: No results found.  Assessment/Plan 1. Essential hypertension (Primary) Her BP is Running high Will Check it for 1 week and Start on Aldactone possible  2. Mixed stress and urge urinary incontinence On High Dose Ditropan per Urology No Side effects  3. Postoperative hypothyroidism TSH good limits now  4. 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 Right leg pain Tylenol and Voltaren PRN 6 Cough Lungs Clear Robitussin PRn    Labs/tests ordered:  * No order type specified * Next appt:  Visit date not found

## 2024-02-20 ENCOUNTER — Non-Acute Institutional Stay (SKILLED_NURSING_FACILITY): Payer: Self-pay | Admitting: Orthopedic Surgery

## 2024-02-20 ENCOUNTER — Encounter: Payer: Self-pay | Admitting: Orthopedic Surgery

## 2024-02-20 DIAGNOSIS — E890 Postprocedural hypothyroidism: Secondary | ICD-10-CM

## 2024-02-20 DIAGNOSIS — I10 Essential (primary) hypertension: Secondary | ICD-10-CM | POA: Diagnosis not present

## 2024-02-20 DIAGNOSIS — F0390 Unspecified dementia without behavioral disturbance: Secondary | ICD-10-CM | POA: Diagnosis not present

## 2024-02-20 DIAGNOSIS — G8929 Other chronic pain: Secondary | ICD-10-CM | POA: Diagnosis not present

## 2024-02-20 DIAGNOSIS — K5901 Slow transit constipation: Secondary | ICD-10-CM | POA: Diagnosis not present

## 2024-02-20 DIAGNOSIS — N3946 Mixed incontinence: Secondary | ICD-10-CM | POA: Diagnosis not present

## 2024-02-20 DIAGNOSIS — M25551 Pain in right hip: Secondary | ICD-10-CM | POA: Diagnosis not present

## 2024-02-20 DIAGNOSIS — M810 Age-related osteoporosis without current pathological fracture: Secondary | ICD-10-CM

## 2024-02-20 NOTE — Progress Notes (Signed)
 Location:  [facility] Room Number: 25/A Place of Service:  SNF [id number]) Provider:  Octavia Heir, NP   [name], MD  Patient Care Team: [name], MD as PCP - General (Internal Medicine) [name], MD as Consulting Physician (Endocrinology)  Extended Emergency Contact Information Primary Emergency Contact: Remsen,Gordon Address: [address]          [place], [place] [address] Darden [place] of [place] Home Phone: [phone] Mobile Phone: [phone] Relation: Spouse Secondary Emergency Contact: [name],[name] Address: [address]          [place], [place] [address] Darden Amber of [place] Home Phone: [phone] Work Phone: [phone] Mobile Phone: [phone] Relation: Son  Code Status:  DNR Goals of care: Advanced Directive information    11/17/2023   10:54 AM  Advanced Directives  Does Patient Have a Medical Advance Directive? Yes  Type of Estate agent of [place];Living will;Out of facility DNR (pink MOST or yellow form)  Does patient want to make changes to medical advance directive? No - Patient declined  Copy of Healthcare Power of Attorney in Chart? Yes - validated most recent copy scanned in chart (See row information)     Chief Complaint  Patient presents with   Medical Management of Chronic Issues    HPI:  Pt is a 89 y.o. female seen today for medical management of chronic diseases.    She currently resides on the skilled nursing unit at [facility]. PMH: HTN, migraines, rectal bleeding, hypothyroidism, s/p thyroidectomy (left) 2017, dementia, osteoporosis, constipation, h/o breast and skin cancer, abnormal gait and insomnia.    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, remains on tylenol, voltaren gel and lidocaine patches Senile osteoporosis- DEXA 09/2021, remains on vitamin D Urinary incontinence- stable with oxybutynin Hypothyroidism- s/p left lobe thyroidectomy 2017, TSH 4.68 10/27/2023, remains on levothyroxine Constipation- remains on senna and psyllium  Recent blood pressures:  03/11- 132/75  03/06- 119/64  03/05- 146/80  Recent weights:  03/03- 173.7 lbs  02/03- 175.8 lbs  01/02- 175.1 lbs  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   Hypertension    Insomnia    Knee pain, bilateral 2014   Lymphedema    Left arm following mastectomy   Memory impairment    Memory loss    Migraine 05/16/2012   Migraine without aura, without mention of intractable migraine without mention of status migrainosus    patient denies at preop of 08/08/16    Other and unspecified hyperlipidemia    Other bursitis disorders    Other infective bursitis, left shoulder    Pain in joint, ankle and foot    Pain in joint, hand    Pain in joint, lower leg    Pain in  joint, shoulder region    Pain in joint, shoulder region 04/23/2016   Pain in limb    Rectal bleeding 08/20/2016   Senile osteoporosis    Symptomatic menopausal or female climacteric states    Syncope and collapse    Unspecified hypertensive heart disease without heart failure 1995   Unspecified hypothyroidism    Unspecified polyarthropathy or polyarthritis, site unspecified    Unspecified urinary incontinence    Unspecified visual  loss    Xerophthalmia    Past Surgical History:  Procedure Laterality Date   ABDOMINAL HYSTERECTOMY     BILATERAL OOPHORECTOMY  1981   BREAST RECONSTRUCTION Left 01/2000   BREAST REDUCTION SURGERY Right 01/2000   CATARACT EXTRACTION Right 09/04/2005   COLONOSCOPY  01/2004   MASTECTOMY Left 10/1999   THYROID LOBECTOMY Left 08/14/2016   Procedure: NEAR TOTAL THYROIDECTOMY;  Surgeon: Luretha Murphy, MD;  Location: [facility];  Service: General;  Laterality: Left;   TOTAL ABDOMINAL HYSTERECTOMY W/ BILATERAL SALPINGOOPHORECTOMY  1981   TOTAL KNEE ARTHROPLASTY Right 2014    Allergies  Allergen Reactions   Donepezil Hcl     Body pain and stiffness    Namenda [Memantine Hcl]      Caused body pain    Sulfa Antibiotics    Sulfamethoxazole Rash    Outpatient Encounter Medications as of 02/20/2024  Medication Sig   acetaminophen (TYLENOL) 325 MG tablet Take 650 mg by mouth 3 (three) times daily.   amLODipine (NORVASC) 10 MG tablet Take 1 tablet (10 mg total) by mouth daily.   Cholecalciferol 5000 units capsule Take 5,000 Units by mouth daily.   diclofenac Sodium (VOLTAREN) 1 % GEL Apply 2 g topically daily at 12 noon.   levothyroxine (SYNTHROID) 112 MCG tablet Take 112 mcg by mouth daily before breakfast.   lidocaine (HM LIDOCAINE PATCH) 4 % Place 2 patches onto the skin daily. Apply to lower back topically one time a day.   losartan (COZAAR) 100 MG tablet Take 100 mg by mouth daily.   melatonin 5 MG TABS Take 1 tablet (5 mg total) by mouth at bedtime.   Menthol, Topical Analgesic, (BIOFREEZE) 4 % GEL Apply 1 Application topically as needed. Apply to lower legs topically as needed for pain.   Multiple Vitamins-Minerals (THEREMS-M) TABS Take 1 tablet by mouth every morning.   Omega-3 Fatty Acids (OMEGA-3 CF PO) Take 1,000 mg by mouth every morning. With food   Ostomy Supplies (SKIN PREP WIPES) MISC 2 Applications by Does not apply route as directed. Apply to heels topically for skin care treatment.    oxybutynin (DITROPAN XL) 15 MG 24 hr tablet Take 15 mg by mouth at bedtime.   Polyethyl Glycol-Propyl Glycol 0.4-0.3 % SOLN Place 2 drops into both eyes daily as needed (dry eyes).    Psyllium (REGULOID) 48.57 % POWD Take by mouth daily. 1tsp   senna-docusate (SENNA S) 8.6-50 MG tablet Take 2 tablets by mouth daily.   triamcinolone (KENALOG) 0.025 % cream Apply 1 application  topically as needed (Apply to affected area topically).   zinc oxide 20 % ointment Apply 1 application  topically as needed for irritation. Apply to buttocks topically after every incontinent episode for redness   No facility-administered encounter medications on file as of 02/20/2024.    Review of Systems  Unable to perform ROS: Dementia    Immunization History  Administered Date(s) Administered   Fluad Quad(high Dose 65+) 10/02/2022   Influenza, High Dose Seasonal PF 09/17/2017, 09/10/2018, 09/22/2019, 09/13/2021, 10/08/2023  Influenza, Quadrivalent, Recombinant, Inj, Pf 08/29/2020   Influenza,inj,Quad PF,6+ Mos 09/10/2018   Influenza-Unspecified 08/10/2013, 09/07/2015, 09/13/2016   Moderna Covid-19 Vaccine Bivalent Booster 80yrs & up 05/22/2022   Moderna SARS-COV2 Booster Vaccination 05/16/2021   Moderna Sars-Covid-2 Vaccination 12/13/2019, 01/10/2020, 10/23/2020   PFIZER(Purple Top)SARS-COV-2 Vaccination 08/29/2021   Pfizer Covid-19 Vaccine Bivalent Booster 46yrs & up 10/15/2022   Pneumococcal Conjugate-13 06/08/2017, 11/02/2018   Pneumococcal Polysaccharide-23 12/09/2002, 06/08/2017, 10/22/2017, 11/02/2018   Pneumococcal-Unspecified 06/08/2017, 11/02/2018   Tdap 02/21/2014, 10/29/2017   Zoster Recombinant(Shingrix) 07/18/2017, 01/10/2018   Zoster, Live 07/18/2017, 01/10/2018   Pertinent  Health Maintenance Due  Topic Date Due   OPHTHALMOLOGY EXAM  05/28/2024   INFLUENZA VACCINE  Completed   DEXA SCAN  Completed      12/16/2022   10:20 AM 02/21/2023    2:43 PM 03/18/2023    2:13 PM 07/18/2023    3:01  PM 11/17/2023   12:29 PM  Fall Risk  Falls in the past year? 0 1 0 1 0  Was there an injury with Fall? 0 0 0 0 0  Fall Risk Category Calculator 0 2 0 1 0  Fall Risk Category (Retired) Low      (RETIRED) Patient Fall Risk Level Moderate fall risk      Patient at Risk for Falls Due to History of fall(s) History of fall(s);Impaired balance/gait History of fall(s);Impaired balance/gait History of fall(s);Impaired balance/gait;Impaired mobility History of fall(s);Impaired balance/gait;Impaired mobility  Patient at Risk for Falls Due to - Comments  H/o dementia     Fall risk Follow up Falls evaluation completed Falls evaluation completed;Education provided;Falls prevention discussed Falls evaluation completed Falls evaluation completed;Education provided;Falls prevention discussed Falls evaluation completed;Education provided;Falls prevention discussed   Functional Status Survey:    Vitals:   02/20/24 1320  BP: 132/75  Pulse: 75  Resp: 17  Temp: (!) 96.8 F (36 C)  SpO2: 98%  Weight: 173 lb 11.2 oz (78.8 kg)  Height: 5\' 4"  (1.626 m)   Body mass index is 29.82 kg/m. Physical Exam Vitals reviewed.  Constitutional:      General: She is not in acute distress. HENT:     Head: Normocephalic.     Right Ear: There is no impacted cerumen.     Left Ear: There is no impacted cerumen.     Nose: Nose normal.     Mouth/Throat:     Mouth: Mucous membranes are moist.  Eyes:     General:        Right eye: No discharge.        Left eye: No discharge.  Cardiovascular:     Rate and Rhythm: Normal rate and regular rhythm.     Pulses: Normal pulses.     Heart sounds: Normal heart sounds.  Pulmonary:     Effort: Pulmonary effort is normal.     Breath sounds: Normal breath sounds.  Abdominal:     General: Bowel sounds are normal. There is no distension.     Palpations: Abdomen is soft.     Tenderness: There is no abdominal tenderness.  Musculoskeletal:     Cervical back: Neck supple.      Right lower leg: No edema.     Left lower leg: No edema.  Skin:    General: Skin is warm.     Capillary Refill: Capillary refill takes less than 2 seconds.  Neurological:     General: No focal deficit present.     Mental Status: She is alert. Mental status  is at baseline.     Motor: Weakness present.     Gait: Gait abnormal.     Comments: Wheelchair/walker  Psychiatric:        Mood and Affect: Mood normal.     Comments: Very pleasant, follows commands, alert to self/person, aphasia     Labs reviewed: Recent Labs    03/27/23 0000 09/12/23 0000  NA 133* 133*  K 4.5 4.5  CL 100 98*  CO2 27* 22  BUN 18 16  CREATININE 0.7 0.7  CALCIUM 9.5 9.3   Recent Labs    09/12/23 0000  AST 14  ALT 10  ALKPHOS 68  ALBUMIN 3.9   Recent Labs    06/10/23 0000 09/12/23 0000  WBC 8.4 6.2  NEUTROABS  --  3,900.00  HGB 13.9 11.9*  HCT 42 37  PLT 363 445*   Lab Results  Component Value Date   TSH 4.68 10/24/2023   No results found for: "HGBA1C" Lab Results  Component Value Date   CHOL 190 07/26/2020   HDL 56 07/26/2020   LDLCALC 109 (H) 07/26/2020   TRIG 139 07/26/2020   CHOLHDL 3.4 07/26/2020    Significant Diagnostic Results in last 30 days:  No results found.  Assessment/Plan 1. Dementia without behavioral disturbance (HCC) (Primary) - no behaviors - dependent with some ADLs - weight stable - unsuccessful trial of Namenda, Aricept and Elexon in past - cont skilled nursing  2. Essential hypertension - controlled - cont amlodipine and losartan  3. Chronic pain of right hip - ongoing - transfers 1+ assist - no recent falls - past xray showed sever arthritis - cont tylenol, voltaren and lidocaine patches  4. Senile osteoporosis - DEXA 2022 - cont vitamin D  5. Mixed stress and urge urinary incontinence - stable with oxybutynin  6. Postoperative hypothyroidism - TSH stable - cont levothyroxine  7. Slow transit constipation - abdomen soft - cont  senna and psyllium    Family/ staff Communication: plan discussed with patient and nurse  Labs/tests ordered:  none

## 2024-02-27 ENCOUNTER — Encounter: Payer: Self-pay | Admitting: Orthopedic Surgery

## 2024-02-27 ENCOUNTER — Non-Acute Institutional Stay: Payer: Self-pay | Admitting: Orthopedic Surgery

## 2024-02-27 DIAGNOSIS — Z0000 Encounter for general adult medical examination without abnormal findings: Secondary | ICD-10-CM

## 2024-02-27 NOTE — Progress Notes (Signed)
 Subjective:   Frances Holland is a 89 y.o. female who presents for Medicare Annual (Subsequent) preventive examination.  Visit Complete: In person  Patient Medicare AWV questionnaire was completed by the patient on 02/27/2024; I have confirmed that all information answered by patient is correct and no changes since this date.  Cardiac Risk Factors include: advanced age (>89men, >60 women);hypertension;sedentary lifestyle     Objective:    Today's Vitals   02/27/24 1337  BP: (!) 159/83  Pulse: 79  Resp: 19  Temp: (!) 96.5 F (35.8 C)  SpO2: 97%  Weight: 173 lb 11.2 oz (78.8 kg)  Height: 5\' 4"  (1.626 m)   Body mass index is 29.82 kg/m.     11/17/2023   10:54 AM 10/20/2023    9:41 AM 09/18/2023   11:30 AM 07/18/2023    9:30 AM 07/03/2023    3:14 PM 06/19/2023    2:30 PM 04/30/2023    9:04 AM  Advanced Directives  Does Patient Have a Medical Advance Directive? Yes Yes Yes Yes Yes Yes Yes  Type of Estate agent of [place];Living will;Out of facility DNR (pink MOST or yellow form) Healthcare Power of [place];Living will;Out of facility DNR (pink MOST or yellow form) Healthcare Power of [place];Living will;Out of facility DNR (pink MOST or yellow form) Healthcare Power of [place];Living will;Out of facility DNR (pink MOST or yellow form) Healthcare Power of [place];Out of facility DNR (pink MOST or yellow form);Living will Healthcare Power of [name];Out of facility DNR (pink MOST or yellow form);Living will Healthcare Power of [place];Living will;Out of facility DNR (pink MOST or yellow form)  Does patient want to make changes to medical advance directive? No - Patient declined No - Patient declined No - Patient declined No - Patient declined No - Patient declined No - Patient declined No - Patient declined  Copy of Healthcare Power of Attorney in Chart? Yes - validated most recent copy scanned in chart (See row information) Yes - validated most recent copy  scanned in chart (See row information) No - copy requested Yes - validated most recent copy scanned in chart (See row information) Yes - validated most recent copy scanned in chart (See row information) Yes - validated most recent copy scanned in chart (See row information) Yes - validated most recent copy scanned in chart (See row information)    Current Medications (verified) Outpatient Encounter Medications as of 02/27/2024  Medication Sig   acetaminophen (TYLENOL) 325 MG tablet Take 650 mg by mouth 3 (three) times daily.   amLODipine (NORVASC) 10 MG tablet Take 1 tablet (10 mg total) by mouth daily.   Cholecalciferol 5000 units capsule Take 5,000 Units by mouth daily.   diclofenac Sodium (VOLTAREN) 1 % GEL Apply 2 g topically daily at 12 noon.   guaiFENesin-dextromethorphan (ROBITUSSIN DM) 100-10 MG/5ML syrup Take 10 mLs by mouth every 6 (six) hours as needed for cough.   levothyroxine (SYNTHROID) 112 MCG tablet Take 112 mcg by mouth daily before breakfast.   lidocaine (HM LIDOCAINE PATCH) 4 % Place 2 patches onto the skin daily. Apply to lower back topically one time a day.   losartan (COZAAR) 100 MG tablet Take 100 mg by mouth daily.   melatonin 5 MG TABS Take 1 tablet (5 mg total) by mouth at bedtime.   Menthol, Topical Analgesic, (BIOFREEZE) 4 % GEL Apply 1 Application topically as needed. Apply to lower legs topically as needed for pain.   Multiple Vitamins-Minerals (THEREMS-M) TABS Take 1  tablet by mouth every morning.   Omega-3 Fatty Acids (OMEGA-3 CF PO) Take 1,000 mg by mouth every morning. With food   Ostomy Supplies (SKIN PREP WIPES) MISC 2 Applications by Does not apply route as directed. Apply to heels topically for skin care treatment.   oxybutynin (DITROPAN XL) 15 MG 24 hr tablet Take 15 mg by mouth at bedtime.   Polyethyl Glycol-Propyl Glycol 0.4-0.3 % SOLN Place 2 drops into both eyes daily as needed (dry eyes).    Psyllium (REGULOID) 48.57 % POWD Take by mouth daily. 1tsp    senna-docusate (SENNA S) 8.6-50 MG tablet Take 2 tablets by mouth daily.   triamcinolone (KENALOG) 0.025 % cream Apply 1 application  topically as needed (Apply to affected area topically).   zinc oxide 20 % ointment Apply 1 application  topically as needed for irritation. Apply to buttocks topically after every incontinent episode for redness   No facility-administered encounter medications on file as of 02/27/2024.    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   Hypertension    Insomnia    Knee pain, bilateral 2014   Lymphedema    Left arm following mastectomy   Memory impairment    Memory loss    Migraine 05/16/2012   Migraine without aura, without mention of intractable migraine without mention of status migrainosus    patient denies at preop of 08/08/16    Other and unspecified hyperlipidemia    Other bursitis disorders    Other infective bursitis, left shoulder    Pain in joint, ankle and foot    Pain in joint, hand    Pain in joint, lower leg    Pain in joint, shoulder region    Pain in joint, shoulder region 04/23/2016   Pain in limb    Rectal bleeding 08/20/2016   Senile osteoporosis    Symptomatic menopausal or female climacteric states    Syncope and collapse    Unspecified hypertensive heart disease without heart failure 1995   Unspecified hypothyroidism    Unspecified polyarthropathy or polyarthritis, site unspecified    Unspecified urinary incontinence    Unspecified visual loss    Xerophthalmia    Past Surgical History:  Procedure Laterality  Date   ABDOMINAL HYSTERECTOMY     BILATERAL OOPHORECTOMY  1981   BREAST RECONSTRUCTION Left 01/2000   BREAST REDUCTION SURGERY Right 01/2000   CATARACT EXTRACTION Right 09/04/2005   COLONOSCOPY  01/2004   MASTECTOMY Left 10/1999   THYROID LOBECTOMY Left 08/14/2016   Procedure: NEAR TOTAL THYROIDECTOMY;  Surgeon: Luretha Murphy, MD;  Location: [facility];  Service: General;  Laterality: Left;   TOTAL ABDOMINAL HYSTERECTOMY W/ BILATERAL SALPINGOOPHORECTOMY  1981   TOTAL KNEE ARTHROPLASTY Right 2014   Family History  Problem Relation Age of Onset   Heart disease Mother    Dementia Mother    Dementia Father    Cancer Daughter    Asthma Son    Social History   Socioeconomic History   Marital status: Married  Spouse name: Not on file   Number of children: 3   Years of education: 14   Highest education level: Not on file  Occupational History   Occupation: retired Energy manager   Tobacco Use   Smoking status: Never   Smokeless tobacco: Never  Vaping Use   Vaping status: Never Used  Substance and Sexual Activity   Alcohol use: No   Drug use: No   Sexual activity: Never    Comment: 1st intercourse 89 yo-Fewer than 5 partners  Other Topics Concern   Not on file  Social History Narrative   [facility] at [facility] since 07/07/2014   Married -[facility]   Never smoked   Right handed     Alcohol none   Caffeine 2 cups of coffee daily   Exercise walks daily, exercise class half hour 2-3 times a week   Living will,  POA   Patient believes that her memory is OK except for recall of names. Her husband is concerned about it. She scored 29/30 on MMSE in Dec 2017. They both have concerns about the future if he precedes her in death, but he has written down how he keeps accounts and has scheduled an appt with an accountant to do their taxes. They are a 2nd marriage and have his and her families although the "children" get along. They have been married 30 years.   Social Drivers of Manufacturing engineer Strain: Low Risk  (02/27/2024)   Overall Financial Resource Strain (CARDIA)    Difficulty of Paying Living Expenses: Not hard at all  Food Insecurity: No Food Insecurity (02/27/2024)   Hunger Vital Sign    Worried About Running Out of Food in the Last Year: Never true    Ran Out of Food in the Last Year: Never true  Transportation Needs: No Transportation Needs (02/27/2024)   PRAPARE - Administrator, Civil Service (Medical): No    Lack of Transportation (Non-Medical): No  Physical Activity: Insufficiently Active (02/27/2024)   Exercise Vital Sign    Days of Exercise per Week: 7 days    Minutes of Exercise per Session: 10 min  Stress: No Stress Concern Present (02/27/2024)   [employer] of Occupational Health - Occupational Stress Questionnaire    Feeling of Stress : Only a little  Social Connections: Moderately Integrated (02/27/2024)   Social Connection and Isolation Panel [NHANES]    Frequency of Communication with Friends and Family: Twice a week    Frequency of Social Gatherings with Friends and Family: More than three times a week    Attends Religious Services: More than 4 times per year    Active Member of [employer] or Organizations: No    Attends Engineer, structural: Never    Marital Status: Married    Tobacco Counseling Counseling given: Not Answered   Clinical Intake:  Pre-visit preparation completed: No  Pain : No/denies pain     BMI - recorded: 29.82 Nutritional Status: BMI 25 -29 Overweight Nutritional Risks: None Diabetes: No  How often do you need to have someone help you when you read instructions, pamphlets, or other written materials from your doctor or pharmacy?: 4 - Often What is the last grade level you completed in school?: 2 years college  Interpreter Needed?: No      Activities of Daily Living    02/27/2024    1:42 PM  In your present state of health, do you have any difficulty performing  the  following activities:  Hearing? 0  Vision? 0  Difficulty concentrating or making decisions? 1  Walking or climbing stairs? 1  Dressing or bathing? 1  Doing errands, shopping? 1  Preparing Food and eating ? Y  Using the Toilet? Y  In the past six months, have you accidently leaked urine? Y  Do you have problems with loss of bowel control? Y  Managing your Medications? Y  Managing your Finances? Y  Housekeeping or managing your Housekeeping? Y    Patient Care Team: [name], MD as PCP - General (Internal Medicine) [name], MD as Consulting Physician (Endocrinology)  Indicate any recent Medical Services you may have received from other than [facility] providers in the past year (date may be approximate).     Assessment:   This is a routine wellness examination for [place].  Hearing/Vision screen No results found.   Goals Addressed             This Visit's Progress    Increase physical activity   Not on track    Maintain LIfestyle   On track    Starting today pt will maintain lifestyle.      Maintain Mobility and Function   On track    Evidence-based guidance:  Acknowledge and validate impact of pain, loss of strength and potential disfigurement (hand osteoarthritis) on mental health and daily life, such as social isolation, anxiety, depression, impaired sexual relationship and   injury from falls.  Anticipate referral to physical or occupational therapy for assessment, therapeutic exercise and recommendation for adaptive equipment or assistive devices; encourage participation.  Assess impact on ability to perform activities of daily living, as well as engage in sports and leisure events or requirements of work or school.  Provide anticipatory guidance and reassurance about the benefit of exercise to maintain function; acknowledge and normalize fear that exercise may worsen symptoms.  Encourage regular exercise, at least 10 minutes at a time for 45 minutes per  week; consider yoga, water exercise and proprioceptive exercises; encourage use of wearable activity tracker to increase motivation and adherence.  Encourage maintenance or resumption of daily activities, including employment, as pain allows and with minimal exposure to trauma.  Assist patient to advocate for adaptations to the work environment.  Consider level of pain and function, gender, age, lifestyle, patient preference, quality of life, readiness and ?ocapacity to benefit? when recommending patients for orthopaedic surgery consultation.  Explore strategies, such as changes to medication regimen or activity that enables patient to anticipate and manage flare-ups that increase deconditioning and disability.  Explore patient preferences; encourage exposure to a broader range of activities that have been avoided for fear of experiencing pain.  Identify barriers to participation in therapy or exercise, such as pain with activity, anticipated or imagined pain.  Monitor postoperative joint replacement or any preexisting joint replacement for ongoing pain and loss of function; provide social support and encouragement throughout recovery.   Notes:        Depression Screen    02/27/2024    1:44 PM 02/22/2024    5:55 PM 01/02/2024    3:50 PM 11/17/2023   12:29 PM 09/12/2023    2:05 PM 03/18/2023    2:14 PM 02/21/2023    2:42 PM  PHQ 2/9 Scores  PHQ - 2 Score    0  0 0  Exception Documentation Medical reason Medical reason Medical reason  Medical reason      Fall Risk    02/27/2024  1:44 PM 02/22/2024    5:55 PM 11/17/2023   12:29 PM 07/18/2023    3:01 PM 03/18/2023    2:13 PM  Fall Risk   Falls in the past year? 0 0 0 1 0  Number falls in past yr: 0 0 0 0 0  Injury with Fall? 0 0 0 0 0  Risk for fall due to : History of fall(s);Impaired mobility History of fall(s);Impaired balance/gait;Impaired mobility History of fall(s);Impaired balance/gait;Impaired mobility History of fall(s);Impaired  balance/gait;Impaired mobility History of fall(s);Impaired balance/gait  Follow up Falls evaluation completed;Education provided Falls evaluation completed;Education provided Falls evaluation completed;Education provided;Falls prevention discussed Falls evaluation completed;Education provided;Falls prevention discussed Falls evaluation completed    MEDICARE RISK AT HOME: Medicare Risk at Home Any stairs in or around the home?: No If so, are there any without handrails?: No Home free of loose throw rugs in walkways, pet beds, electrical cords, etc?: Yes Adequate lighting in your home to reduce risk of falls?: Yes Life alert?: No Use of a cane, walker or w/c?: Yes Grab bars in the bathroom?: Yes Shower chair or bench in shower?: Yes Elevated toilet seat or a handicapped toilet?: Yes  TIMED UP AND GO:  Was the test performed?  No    Cognitive Function:    02/27/2024    2:05 PM 02/21/2023    2:44 PM 02/18/2022    2:21 PM 09/05/2021    1:49 PM 04/11/2020    4:29 PM  MMSE - Mini Mental State Exam  Not completed:  Unable to complete Unable to complete    Orientation to time 0   3 3  Orientation to Place 0   4 4  Registration 0   3 3  Attention/ Calculation 5   1 3   Recall 0   3 2  Language- name 2 objects 2   2 2   Language- repeat 1   1 1   Language- follow 3 step command 3   3 3   Language- read & follow direction 1   1 1   Write a sentence 1   1 1   Copy design 0   1 1  Total score 13   23 24       11/14/2021    2:08 PM  Montreal Cognitive Assessment   Visuospatial/ Executive (0/5) 2  Naming (0/3) 2  Attention: Read list of digits (0/2) 1  Attention: Read list of letters (0/1) 0  Attention: Serial 7 subtraction starting at 100 (0/3) 0  Language: Repeat phrase (0/2) 0  Language : Fluency (0/1) 0  Abstraction (0/2) 2  Delayed Recall (0/5) 0  Orientation (0/6) 4  Total 11  Adjusted Score (based on education) 11      Immunizations Immunization History  Administered Date(s)  Administered   Fluad Quad(high Dose 65+) 10/02/2022   Influenza, High Dose Seasonal PF 09/17/2017, 09/10/2018, 09/22/2019, 09/13/2021, 10/08/2023   Influenza, Quadrivalent, Recombinant, Inj, Pf 08/29/2020   Influenza,inj,Quad PF,6+ Mos 09/10/2018   Influenza-Unspecified 08/10/2013, 09/07/2015, 09/13/2016   Moderna Covid-19 Vaccine Bivalent Booster 18yrs & up 05/22/2022   Moderna SARS-COV2 Booster Vaccination 05/16/2021   Moderna Sars-Covid-2 Vaccination 12/13/2019, 01/10/2020, 10/23/2020   PFIZER(Purple Top)SARS-COV-2 Vaccination 08/29/2021   Pfizer Covid-19 Vaccine Bivalent Booster 89yrs & up 10/15/2022   Pneumococcal Conjugate-13 06/08/2017, 11/02/2018   Pneumococcal Polysaccharide-23 12/09/2002, 06/08/2017, 10/22/2017, 11/02/2018   Pneumococcal-Unspecified 06/08/2017, 11/02/2018   Tdap 02/21/2014, 10/29/2017   Zoster Recombinant(Shingrix) 07/18/2017, 01/10/2018   Zoster, Live 07/18/2017, 01/10/2018    TDAP status: Up to date  Flu Vaccine status: Up to date  Pneumococcal vaccine status: Up to date  Covid-19 vaccine status: Completed vaccines  Qualifies for Shingles Vaccine? Yes   Zostavax completed Yes   Shingrix Completed?: Yes  Screening Tests Health Maintenance  Topic Date Due   COVID-19 Vaccine (7 - 2024-25 season) 08/10/2023   OPHTHALMOLOGY EXAM  05/28/2024   Medicare Annual Wellness (AWV)  02/26/2025   DTaP/Tdap/Td (3 - Td or Tdap) 10/30/2027   Pneumonia Vaccine 68+ Years old  Completed   INFLUENZA VACCINE  Completed   DEXA SCAN  Completed   Zoster Vaccines- Shingrix  Completed   HPV VACCINES  Aged Out    Health Maintenance  Health Maintenance Due  Topic Date Due   COVID-19 Vaccine (7 - 2024-25 season) 08/10/2023    Colorectal cancer screening: No longer required.   Mammogram status: No longer required due to advanced age.  Bone Density status: Completed 09/2021. Results reflect: Bone density results: OSTEOPOROSIS. Repeat every no further studies>  limited ambulation/ advanced age years.  Lung Cancer Screening: (Low Dose CT Chest recommended if Age 63-80 years, 20 pack-year currently smoking OR have quit w/in 15years.) does not qualify.   Lung Cancer Screening Referral: No  Additional Screening:  Hepatitis C Screening: does not qualify; Completed   Vision Screening: Recommended annual ophthalmology exams for early detection of glaucoma and other disorders of the eye. Is the patient up to date with their annual eye exam?  Yes 11/28/2023 Who is the provider or what is the name of the office in which the patient attends annual eye exams? [facility] If pt is not established with a provider, would they like to be referred to a provider to establish care? No .   Dental Screening: Recommended annual dental exams for proper oral hygiene  Diabetic Foot Exam: Diabetic Foot Exam: Completed 02/20/2024  Community Resource Referral / Chronic Care Management: CRR required this visit?  No   CCM required this visit?  No     Plan:     I have personally reviewed and noted the following in the patient's chart:   Medical and social history Use of alcohol, tobacco or illicit drugs  Current medications and supplements including opioid prescriptions. Patient is not currently taking opioid prescriptions. Functional ability and status Nutritional status Physical activity Advanced directives List of other physicians Hospitalizations, surgeries, and ER visits in previous 12 months Vitals Screenings to include cognitive, depression, and falls Referrals and appointments  In addition, I have reviewed and discussed with patient certain preventive protocols, quality metrics, and best practice recommendations. A written personalized care plan for preventive services as well as general preventive health recommendations were provided to patient.     Octavia Heir, NP   02/27/2024   After Visit Summary: (MyChart) Due to this being a telephonic  visit, the after visit summary with patients personalized plan was offered to patient via MyChart   Nurse Notes: MMSE 13/30. Lives in skilled nursing due to advanced dementia. Dependent with ADLs except feeding. UTD on vaccinations. Husband present during encounter. No further recommendations at this time.

## 2024-03-22 ENCOUNTER — Encounter: Payer: Self-pay | Admitting: Orthopedic Surgery

## 2024-03-22 ENCOUNTER — Non-Acute Institutional Stay (SKILLED_NURSING_FACILITY): Payer: Self-pay | Admitting: Orthopedic Surgery

## 2024-03-22 DIAGNOSIS — N3946 Mixed incontinence: Secondary | ICD-10-CM | POA: Diagnosis not present

## 2024-03-22 DIAGNOSIS — I890 Lymphedema, not elsewhere classified: Secondary | ICD-10-CM | POA: Diagnosis not present

## 2024-03-22 DIAGNOSIS — M810 Age-related osteoporosis without current pathological fracture: Secondary | ICD-10-CM | POA: Diagnosis not present

## 2024-03-22 DIAGNOSIS — M25551 Pain in right hip: Secondary | ICD-10-CM | POA: Diagnosis not present

## 2024-03-22 DIAGNOSIS — K5901 Slow transit constipation: Secondary | ICD-10-CM | POA: Diagnosis not present

## 2024-03-22 DIAGNOSIS — I10 Essential (primary) hypertension: Secondary | ICD-10-CM | POA: Diagnosis not present

## 2024-03-22 DIAGNOSIS — H6123 Impacted cerumen, bilateral: Secondary | ICD-10-CM

## 2024-03-22 DIAGNOSIS — E890 Postprocedural hypothyroidism: Secondary | ICD-10-CM

## 2024-03-22 DIAGNOSIS — F0390 Unspecified dementia without behavioral disturbance: Secondary | ICD-10-CM | POA: Diagnosis not present

## 2024-03-22 DIAGNOSIS — G8929 Other chronic pain: Secondary | ICD-10-CM | POA: Diagnosis not present

## 2024-03-22 MED ORDER — DEBROX 6.5 % OT SOLN: 5.0000 [drp] | Freq: Two times a day (BID) | OTIC | Status: AC

## 2024-03-22 NOTE — Progress Notes (Signed)
 Location:  [facility] Room Number: 25/A Place of Service:  SNF [phone]) Provider:  Octavia Heir, NP   [name], MD  Patient Care Team: [name], MD as PCP - General (Internal Medicine) [name], MD as Consulting Physician (Endocrinology)  Extended Emergency Contact Information Primary Emergency Contact: Speights,Gordon Address: [address]          [place], [place] [address] [place] of [place] Home Phone: [phone] Mobile Phone: [phone] Relation: Spouse Secondary Emergency Contact: [name],[name] Address: [address]          [place], [place] [address] [place] of [place] Home Phone: [phone] Work Phone: [phone] Mobile Phone: [phone] Relation: Son  Code Status:  DNR Goals of care: Advanced Directive information    11/17/2023   10:54 AM  Advanced Directives  Does Patient Have a Medical Advance Directive? Yes  Type of Estate agent of [place];Living will;Out of facility DNR (pink MOST or yellow form)  Does patient want to make changes to medical advance directive? No - Patient declined  Copy of Healthcare Power of Attorney in Chart? Yes - validated most recent copy scanned in chart (See row information)     Chief Complaint  Patient presents with   Medical Management of Chronic Issues    HPI:  Pt is a 89 y.o. female seen today for medical management of chronic diseases.    She currently resides on the skilled nursing unit at [facility]. PMH: HTN, migraines, rectal bleeding, hypothyroidism, s/p thyroidectomy (left) 2017, dementia, osteoporosis, constipation, h/o breast and skin cancer, abnormal gait and insomnia.    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, remains on tylenol, voltaren gel and lidocaine patches Lymphedema- s/p mastectomy 10/1999 Senile osteoporosis- DEXA 09/2021, remains on vitamin D Urinary incontinence- stable with oxybutynin Hypothyroidism- s/p left lobe thyroidectomy 2017, TSH 4.68 10/27/2023, remains on levothyroxine Constipation- remains on senna and psyllium  Recent weights:  04/01- 177 lbs  03/31- 173.7 lbs  02/03- 175.8 lbs   Recent blood pressures:  04/08- 156/79  04/01- 140/80  03/25- 136/80     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   Hypertension    Insomnia    Knee pain, bilateral 2014   Lymphedema    Left arm following mastectomy   Memory impairment    Memory loss    Migraine 05/16/2012   Migraine without aura, without mention of intractable migraine without mention of status migrainosus    patient denies at preop of 08/08/16    Other and unspecified hyperlipidemia    Other bursitis disorders    Other infective bursitis, left shoulder    Pain in joint, ankle and foot    Pain in joint, hand    Pain in  joint, lower leg    Pain in joint, shoulder region    Pain in joint, shoulder region 04/23/2016   Pain in limb    Rectal bleeding 08/20/2016   Senile osteoporosis    Symptomatic menopausal or female climacteric states    Syncope and collapse    Unspecified hypertensive heart disease without heart failure 1995   Unspecified hypothyroidism    Unspecified polyarthropathy or polyarthritis, site unspecified    Unspecified  urinary incontinence    Unspecified visual loss    Xerophthalmia    Past Surgical History:  Procedure Laterality Date   ABDOMINAL HYSTERECTOMY     BILATERAL OOPHORECTOMY  1981   BREAST RECONSTRUCTION Left 01/2000   BREAST REDUCTION SURGERY Right 01/2000   CATARACT EXTRACTION Right 09/04/2005   COLONOSCOPY  01/2004   MASTECTOMY Left 10/1999   THYROID LOBECTOMY Left 08/14/2016   Procedure: NEAR TOTAL THYROIDECTOMY;  Surgeon: Jacolyn Matar, MD;  Location: [facility];  Service: General;  Laterality: Left;   TOTAL ABDOMINAL HYSTERECTOMY W/ BILATERAL SALPINGOOPHORECTOMY  1981   TOTAL KNEE ARTHROPLASTY Right 2014    Allergies  Allergen Reactions   Donepezil Hcl     Body pain and stiffness    Namenda [Memantine Hcl]      Caused body pain    Sulfa Antibiotics    Sulfamethoxazole Rash    Outpatient Encounter Medications as of 03/22/2024  Medication Sig   acetaminophen (TYLENOL) 325 MG tablet Take 650 mg by mouth 3 (three) times daily.   amLODipine (NORVASC) 10 MG tablet Take 1 tablet (10 mg total) by mouth daily.   Cholecalciferol 5000 units capsule Take 5,000 Units by mouth daily.   diclofenac Sodium (VOLTAREN) 1 % GEL Apply 2 g topically daily at 12 noon.   guaiFENesin-dextromethorphan (ROBITUSSIN DM) 100-10 MG/5ML syrup Take 10 mLs by mouth every 6 (six) hours as needed for cough.   levothyroxine (SYNTHROID) 112 MCG tablet Take 112 mcg by mouth daily before breakfast.   lidocaine (HM LIDOCAINE PATCH) 4 % Place 2 patches onto the skin daily. Apply to lower back topically one time a day.   losartan (COZAAR) 100 MG tablet Take 100 mg by mouth daily.   melatonin 5 MG TABS Take 1 tablet (5 mg total) by mouth at bedtime.   Menthol, Topical Analgesic, (BIOFREEZE) 4 % GEL Apply 1 Application topically as needed. Apply to lower legs topically as needed for pain.   Multiple Vitamins-Minerals (THEREMS-M) TABS Take 1 tablet by mouth every morning.   Omega-3 Fatty Acids (OMEGA-3 CF PO) Take 1,000 mg by  mouth every morning. With food   Ostomy Supplies (SKIN PREP WIPES) MISC 2 Applications by Does not apply route as directed. Apply to heels topically for skin care treatment.   oxybutynin (DITROPAN XL) 15 MG 24 hr tablet Take 15 mg by mouth at bedtime.   Polyethyl Glycol-Propyl Glycol 0.4-0.3 % SOLN Place 2 drops into both eyes daily as needed (dry eyes).    Psyllium (REGULOID) 48.57 % POWD Take by mouth daily. 1tsp   senna-docusate (SENNA S) 8.6-50 MG tablet Take 2 tablets by mouth daily.   triamcinolone (KENALOG) 0.025 % cream Apply 1 application  topically as needed (Apply to affected area topically).   zinc oxide 20 % ointment Apply 1 application  topically as needed for irritation. Apply to buttocks topically after every incontinent episode for redness   No facility-administered encounter medications on file as of 03/22/2024.    Review of Systems  Unable to perform ROS:  Dementia    Immunization History  Administered Date(s) Administered   Fluad Quad(high Dose 65+) 10/02/2022   Influenza, High Dose Seasonal PF 09/17/2017, 09/10/2018, 09/22/2019, 09/13/2021, 10/08/2023   Influenza, Quadrivalent, Recombinant, Inj, Pf 08/29/2020   Influenza,inj,Quad PF,6+ Mos 09/10/2018   Influenza-Unspecified 08/10/2013, 09/07/2015, 09/13/2016   Moderna Covid-19 Vaccine Bivalent Booster 7yrs & up 05/22/2022   Moderna SARS-COV2 Booster Vaccination 05/16/2021   Moderna Sars-Covid-2 Vaccination 12/13/2019, 01/10/2020, 10/23/2020   PFIZER(Purple Top)SARS-COV-2 Vaccination 08/29/2021   Pfizer Covid-19 Vaccine Bivalent Booster 73yrs & up 10/15/2022   Pneumococcal Conjugate-13 06/08/2017, 11/02/2018   Pneumococcal Polysaccharide-23 12/09/2002, 06/08/2017, 10/22/2017, 11/02/2018   Pneumococcal-Unspecified 06/08/2017, 11/02/2018   Tdap 02/21/2014, 10/29/2017   Zoster Recombinant(Shingrix) 07/18/2017, 01/10/2018   Zoster, Live 07/18/2017, 01/10/2018   Pertinent  Health Maintenance Due  Topic Date Due    OPHTHALMOLOGY EXAM  05/28/2024   INFLUENZA VACCINE  07/09/2024   DEXA SCAN  Completed      03/18/2023    2:13 PM 07/18/2023    3:01 PM 11/17/2023   12:29 PM 02/22/2024    5:55 PM 02/27/2024    1:44 PM  Fall Risk  Falls in the past year? 0 1 0 0 0  Was there an injury with Fall? 0 0 0 0 0  Fall Risk Category Calculator 0 1 0 0 0  Patient at Risk for Falls Due to History of fall(s);Impaired balance/gait History of fall(s);Impaired balance/gait;Impaired mobility History of fall(s);Impaired balance/gait;Impaired mobility History of fall(s);Impaired balance/gait;Impaired mobility History of fall(s);Impaired mobility  Fall risk Follow up Falls evaluation completed Falls evaluation completed;Education provided;Falls prevention discussed Falls evaluation completed;Education provided;Falls prevention discussed Falls evaluation completed;Education provided Falls evaluation completed;Education provided   Functional Status Survey:    Vitals:   03/22/24 0949  BP: (!) 156/79  Pulse: (!) 59  Resp: 18  Temp: (!) 96.8 F (36 C)  SpO2: 93%  Weight: 177 lb (80.3 kg)  Height: 5\' 4"  (1.626 m)   Body mass index is 30.38 kg/m. Physical Exam Vitals reviewed.  Constitutional:      General: She is not in acute distress. HENT:     Head: Normocephalic.     Right Ear: There is impacted cerumen.     Left Ear: There is impacted cerumen.     Nose: Nose normal.     Mouth/Throat:     Mouth: Mucous membranes are moist.  Eyes:     General:        Right eye: No discharge.        Left eye: No discharge.  Cardiovascular:     Rate and Rhythm: Normal rate and regular rhythm.     Pulses: Normal pulses.     Heart sounds: Normal heart sounds.  Pulmonary:     Effort: Pulmonary effort is normal. No respiratory distress.     Breath sounds: Normal breath sounds. No wheezing.  Abdominal:     General: Bowel sounds are normal.     Palpations: Abdomen is soft.  Musculoskeletal:     Cervical back: Neck supple.      Right lower leg: No edema.     Left lower leg: No edema.     Comments: Lymphedema to left arm  Skin:    General: Skin is warm.     Capillary Refill: Capillary refill takes less than 2 seconds.  Neurological:     General: No focal deficit present.     Mental Status: She is alert and oriented to person, place, and time.  Motor: Weakness present.     Gait: Gait abnormal.  Psychiatric:        Mood and Affect: Mood normal.     Labs reviewed: Recent Labs    03/27/23 0000 09/12/23 0000  NA 133* 133*  K 4.5 4.5  CL 100 98*  CO2 27* 22  BUN 18 16  CREATININE 0.7 0.7  CALCIUM 9.5 9.3   Recent Labs    09/12/23 0000  AST 14  ALT 10  ALKPHOS 68  ALBUMIN 3.9   Recent Labs    06/10/23 0000 09/12/23 0000  WBC 8.4 6.2  NEUTROABS  --  3,900.00  HGB 13.9 11.9*  HCT 42 37  PLT 363 445*   Lab Results  Component Value Date   TSH 4.68 10/24/2023   No results found for: "HGBA1C" Lab Results  Component Value Date   CHOL 190 07/26/2020   HDL 56 07/26/2020   LDLCALC 109 (H) 07/26/2020   TRIG 139 07/26/2020   CHOLHDL 3.4 07/26/2020    Significant Diagnostic Results in last 30 days:  No results found.  Assessment/Plan 1. Bilateral impacted cerumen (Primary) - carbamide peroxide (DEBROX) 6.5 % OTIC solution; Place 5 drops into both ears 2 (two) times daily for 3 days.  2. Dementia without behavioral disturbance (HCC) - no behaviors - dependent with some ADLs - weight stable - unsuccessful trial of Namenda, Aricept and Elexon in past - cont skilled nursing  3. Essential hypertension - controlled with amlodipine and losartan  4. Chronic pain of right hip - past xray showed sever arthritis - cont tylenol, voltaren and lidocaine patches  5. Lymphedema - s/p mastectomy 10/1999  6. Senile osteoporosis - DEXA 2022 - cont vitamin D  7. Mixed stress and urge urinary incontinence - ongoing - cont oxybutynin  8. Postoperative hypothyroidism - TSH 4.68  10/2023 - cont levothyroxine  9. Slow transit constipation - abdomen soft - cont senna and psyllium    Family/ staff Communication: plan discussed with patient and nurse  Labs/tests ordered:  none

## 2024-03-29 ENCOUNTER — Telehealth: Payer: Self-pay | Admitting: Pharmacist

## 2024-03-29 DIAGNOSIS — I10 Essential (primary) hypertension: Secondary | ICD-10-CM

## 2024-03-29 NOTE — Progress Notes (Signed)
   03/29/2024  Patient ID: Frances Holland, female   DOB: Jun 27, 1929, 89 y.o.   MRN: [id number]  Pharmacy Quality Measure Review  This patient is appearing on a report for being at risk of failing the adherence measure for hypertension (ACEi/ARB) medications this calendar year.   Medication: Losartan  25 mg & 100 mg  25 mg --1 tablet filled 12/29/23 100 mg- #28  filled 02/02/24 at [facility].  The fills are not listed in Dr. Anson Basta.   Patient lives in a skilled nursing facility.  Geronimo Krabbe, PharmD, BCACP Clinical Pharmacist [phone]

## 2024-04-12 DIAGNOSIS — R710 Precipitous drop in hematocrit: Secondary | ICD-10-CM | POA: Diagnosis not present

## 2024-04-12 DIAGNOSIS — E039 Hypothyroidism, unspecified: Secondary | ICD-10-CM | POA: Diagnosis not present

## 2024-04-13 ENCOUNTER — Encounter: Payer: Self-pay | Admitting: Orthopedic Surgery

## 2024-04-13 ENCOUNTER — Non-Acute Institutional Stay (SKILLED_NURSING_FACILITY): Payer: Self-pay | Admitting: Orthopedic Surgery

## 2024-04-13 DIAGNOSIS — R195 Other fecal abnormalities: Secondary | ICD-10-CM

## 2024-04-13 DIAGNOSIS — E890 Postprocedural hypothyroidism: Secondary | ICD-10-CM

## 2024-04-13 DIAGNOSIS — F0390 Unspecified dementia without behavioral disturbance: Secondary | ICD-10-CM

## 2024-04-13 LAB — CBC AND DIFFERENTIAL
HCT: 37 (ref 36–46)
Hemoglobin: 12 (ref 12.0–16.0)
Neutrophils Absolute: 3917
Platelets: 450 10*3/uL — AB (ref 150–400)
WBC: 6.4

## 2024-04-13 LAB — BASIC METABOLIC PANEL WITH GFR
BUN: 14 (ref 4–21)
CO2: 27 — AB (ref 13–22)
Chloride: 100 (ref 99–108)
Creatinine: 0.6 (ref 0.5–1.1)
Glucose: 83
Potassium: 4.5 meq/L (ref 3.5–5.1)
Sodium: 134 — AB (ref 137–147)

## 2024-04-13 LAB — COMPREHENSIVE METABOLIC PANEL WITH GFR
Albumin: 4.1 (ref 3.5–5.0)
Calcium: 9.6 (ref 8.7–10.7)
Globulin: 2.7
eGFR: 85

## 2024-04-13 LAB — CBC: RBC: 3.95 (ref 3.87–5.11)

## 2024-04-13 LAB — HEPATIC FUNCTION PANEL
ALT: 9 U/L (ref 7–35)
AST: 13 (ref 13–35)
Alkaline Phosphatase: 71 (ref 25–125)
Bilirubin, Total: 0.3

## 2024-04-13 LAB — TSH: TSH: 11.54 — AB (ref 0.41–5.90)

## 2024-04-13 NOTE — Progress Notes (Signed)
 Location:  [facility] Room Number: N25-A Place of Service:  SNF [phone]) Provider:  Rupert Counts, MD  Patient Care Team: Marguerite Shiley, MD as PCP - General (Internal Medicine) Rosslyn Coons, MD as Consulting Physician (Endocrinology)  Extended Emergency Contact Information Primary Emergency Contact: Ozturk,Gordon Address: [address]          [place], [place] [address] [place] Home Phone: [phone] Mobile Phone: [phone] Relation: Spouse Secondary Emergency Contact: [name],[name] Address: [address]          [place], [place] [address] [place] Home Phone: [phone] Work Phone: [phone] Mobile Phone: [phone] Relation: Son  Code Status:  DNR Goals of care: Advanced Directive information    04/13/2024   11:06 AM  Advanced Directives  Does Patient Have a Medical Advance Directive? Yes  Type of Estate agent of [place];Living will;Out of facility DNR (pink MOST or yellow form)  Does patient want to make changes to medical advance directive? No - Patient declined  Copy of Healthcare Power of Attorney in Chart? Yes - validated most recent copy scanned in chart (See row information)  Pre-existing out of facility DNR order (yellow form or pink MOST form) Pink MOST/Yellow Form most recent copy in chart - Physician notified to receive inpatient order     Chief Complaint  Patient presents with   Acute Visit    Elevated TSH    HPI:  Pt is a 89 y.o. female seen today for acute visit due to elevated TSH.   She currently resides on the skilled nursing unit at [facility]. PMH: HTN, migraines, rectal bleeding, hypothyroidism, s/p thyroidectomy (left) 2017, dementia, osteoporosis, constipation, h/o breast and skin cancer, abnormal gait and insomnia.   Recent lab work reveals TSH 11.52 04/12/2024. She is currently taking levothyroxine  112 mcg daily.  Medication administration review reveals levothyroxine  was being given with morning med pass.   05/05 senna reduced due to loose stools.   No behaviors. Dependent with ADLs except feeding. Weight stable. Ambulates with walker, 1+ assist.   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   Hypertension    Insomnia    Knee pain, bilateral 2014   Lymphedema    Left arm following mastectomy   Memory impairment    Memory loss    Migraine 05/16/2012   Migraine without aura, without mention of intractable migraine without mention of status migrainosus    patient denies at preop of 08/08/16    Other and unspecified hyperlipidemia    Other bursitis disorders    Other infective bursitis, left shoulder    Pain in joint, ankle and foot    Pain in joint, hand    Pain in joint, lower leg    Pain in joint, shoulder region    Pain in joint, shoulder region 04/23/2016   Pain in limb    Rectal bleeding 08/20/2016   Senile osteoporosis    Symptomatic menopausal or female climacteric states    Syncope and collapse    Unspecified hypertensive heart  disease without heart failure 1995   Unspecified hypothyroidism    Unspecified polyarthropathy or polyarthritis, site unspecified    Unspecified urinary incontinence    Unspecified visual loss    Xerophthalmia    Past Surgical History:  Procedure Laterality Date   ABDOMINAL HYSTERECTOMY     BILATERAL OOPHORECTOMY  1981   BREAST RECONSTRUCTION Left 01/2000   BREAST REDUCTION SURGERY Right 01/2000   CATARACT EXTRACTION Right 09/04/2005   COLONOSCOPY  01/2004   MASTECTOMY Left 10/1999   THYROID  LOBECTOMY Left 08/14/2016   Procedure: NEAR TOTAL  THYROIDECTOMY;  Surgeon: Jacolyn Matar, MD;  Location: [facility];  Service: General;  Laterality: Left;   TOTAL ABDOMINAL HYSTERECTOMY W/ BILATERAL SALPINGOOPHORECTOMY  1981   TOTAL KNEE ARTHROPLASTY Right 2014    Allergies  Allergen Reactions   Donepezil  Hcl     Body pain and stiffness    Namenda  [Memantine  Hcl]      Caused body pain    Sulfa Antibiotics    Sulfamethoxazole Rash    Outpatient Encounter Medications as of 04/13/2024  Medication Sig   acetaminophen  (TYLENOL ) 325 MG tablet Take 650 mg by mouth 3 (three) times daily.   amLODipine  (NORVASC ) 10 MG tablet Take 1 tablet (10 mg total) by mouth daily.   Cholecalciferol 5000 units capsule Take 5,000 Units by mouth daily.   diclofenac Sodium (VOLTAREN) 1 % GEL Apply 2 g topically daily at 12 noon.   guaiFENesin -dextromethorphan (ROBITUSSIN DM) 100-10 MG/5ML syrup Take 10 mLs by mouth every 6 (six) hours as needed for cough.   levothyroxine  (SYNTHROID ) 112 MCG tablet Take 112 mcg by mouth daily before breakfast.   lidocaine  (HM LIDOCAINE  PATCH) 4 % Place 2 patches onto the skin daily. Apply to lower back topically one time a day.   losartan  (COZAAR ) 100 MG tablet Take 100 mg by mouth daily.   melatonin 5 MG TABS Take 1 tablet (5 mg total) by mouth at bedtime.   Menthol, Topical Analgesic, (BIOFREEZE) 4 % GEL Apply 1 Application topically as needed. Apply to lower legs topically as needed for pain.   Multiple Vitamins-Minerals (THEREMS-M) TABS Take 1 tablet by mouth every morning.   Omega-3 Fatty Acids (OMEGA-3 CF PO) Take 1,000 mg by mouth every morning. With food   Ostomy Supplies (SKIN PREP WIPES) MISC 2 Applications by Does not apply route as directed. Apply to heels topically for skin care treatment.   oxybutynin (DITROPAN XL) 15 MG 24 hr tablet Take 15 mg by mouth at bedtime.   Psyllium (REGULOID) 48.57 % POWD Take by mouth daily. 1tsp   senna-docusate (SENNA S) 8.6-50 MG tablet Take 2 tablets by mouth daily.   triamcinolone   (KENALOG ) 0.025 % cream Apply 1 application  topically as needed (Apply to affected area topically).   zinc oxide 20 % ointment Apply 1 application  topically as needed for irritation. Apply to buttocks topically after every incontinent episode for redness   Polyethyl Glycol-Propyl Glycol 0.4-0.3 % SOLN Place 2 drops into both eyes daily as needed (dry eyes).  (Patient not taking: Reported on 04/13/2024)   No facility-administered encounter medications on file as of 04/13/2024.    Review of Systems  Unable to perform ROS: Dementia    Immunization History  Administered Date(s) Administered   Fluad Quad(high Dose 65+) 10/02/2022   Influenza, High Dose Seasonal PF 09/17/2017, 09/10/2018, 09/22/2019, 09/13/2021, 10/08/2023   Influenza, Quadrivalent, Recombinant, Inj, Pf 08/29/2020   Influenza,inj,Quad PF,6+ Mos 09/10/2018   Influenza-Unspecified 08/10/2013, 09/07/2015, 09/13/2016  Moderna Covid-19 Vaccine Bivalent Booster 75yrs & up 05/22/2022   Moderna SARS-COV2 Booster Vaccination 05/16/2021   Moderna Sars-Covid-2 Vaccination 12/13/2019, 01/10/2020, 10/23/2020   PFIZER(Purple Top)SARS-COV-2 Vaccination 08/29/2021   Pfizer Covid-19 Vaccine Bivalent Booster 32yrs & up 10/15/2022   Pneumococcal Conjugate-13 06/08/2017, 11/02/2018   Pneumococcal Polysaccharide-23 12/09/2002, 06/08/2017, 10/22/2017, 11/02/2018   Pneumococcal-Unspecified 06/08/2017, 11/02/2018   Tdap 02/21/2014, 10/29/2017   Zoster Recombinant(Shingrix) 07/18/2017, 01/10/2018   Zoster, Live 07/18/2017, 01/10/2018   Pertinent  Health Maintenance Due  Topic Date Due   OPHTHALMOLOGY EXAM  05/28/2024   INFLUENZA VACCINE  07/09/2024   DEXA SCAN  Completed      03/18/2023    2:13 PM 07/18/2023    3:01 PM 11/17/2023   12:29 PM 02/22/2024    5:55 PM 02/27/2024    1:44 PM  Fall Risk  Falls in the past year? 0 1 0 0 0  Was there an injury with Fall? 0 0 0 0 0  Fall Risk Category Calculator 0 1 0 0 0  Patient at Risk for Falls Due  to History of fall(s);Impaired balance/gait History of fall(s);Impaired balance/gait;Impaired mobility History of fall(s);Impaired balance/gait;Impaired mobility History of fall(s);Impaired balance/gait;Impaired mobility History of fall(s);Impaired mobility  Fall risk Follow up Falls evaluation completed Falls evaluation completed;Education provided;Falls prevention discussed Falls evaluation completed;Education provided;Falls prevention discussed Falls evaluation completed;Education provided Falls evaluation completed;Education provided   Functional Status Survey:    Vitals:   04/13/24 1104  BP: 138/67  Pulse: 76  Resp: 18  Temp: 97.9 F (36.6 C)  SpO2: 94%  Weight: 176 lb 3.2 oz (79.9 kg)  Height: 5\' 4"  (1.626 m)   Body mass index is 30.24 kg/m. Physical Exam Vitals reviewed.  Constitutional:      General: She is not in acute distress. HENT:     Head: Normocephalic.  Eyes:     General:        Right eye: No discharge.        Left eye: No discharge.  Cardiovascular:     Rate and Rhythm: Normal rate and regular rhythm.     Pulses: Normal pulses.     Heart sounds: Normal heart sounds.  Pulmonary:     Effort: Pulmonary effort is normal.     Breath sounds: Normal breath sounds.  Abdominal:     General: Bowel sounds are normal.     Palpations: Abdomen is soft.  Musculoskeletal:     Cervical back: Neck supple.     Right lower leg: No edema.     Left lower leg: No edema.  Skin:    General: Skin is warm.     Capillary Refill: Capillary refill takes less than 2 seconds.  Neurological:     General: No focal deficit present.     Mental Status: She is alert. Mental status is at baseline.     Motor: Weakness present.     Gait: Gait abnormal.  Psychiatric:        Mood and Affect: Mood normal.     Labs reviewed: Recent Labs    09/12/23 0000 04/13/24 0000  NA 133* 134*  K 4.5 4.5  CL 98* 100  CO2 22 27*  BUN 16 14  CREATININE 0.7 0.6  CALCIUM  9.3 9.6   Recent Labs     09/12/23 0000 04/13/24 0000  AST 14 13  ALT 10 9  ALKPHOS 68 71  ALBUMIN 3.9 4.1   Recent Labs    06/10/23 0000 09/12/23 0000 04/13/24 0000  WBC 8.4 6.2 6.4  NEUTROABS  --  3,900.00 3,917.00  HGB 13.9 11.9* 12.0  HCT 42 37 37  PLT 363 445* 450*   Lab Results  Component Value Date   TSH 11.54 (A) 04/13/2024   No results found for: "HGBA1C" Lab Results  Component Value Date   CHOL 190 07/26/2020   HDL 56 07/26/2020   LDLCALC 109 (H) 07/26/2020   TRIG 139 07/26/2020   CHOLHDL 3.4 07/26/2020    Significant Diagnostic Results in last 30 days:  No results found.  Assessment/Plan 1. Postoperative hypothyroidism (Primary) - TSH 11.52 04/12/2024 - nursing giving with other medications daily per admin record - will schedule levothyroxine  at 6 am - repeat TSH in 6 weeks  2. Dementia without behavioral disturbance (HCC) - no behaviors - dependent with ADLs - unable to tolerate Namenda , Aricept , Exelon  - cont skilled nursing  3. Loose stools - 05/05 senna reduced to 2x/week - abdomen soft, no N/V - cont psyllium   Family/ staff Communication: plan discussed with husband and nurse  Labs/tests ordered:  none

## 2024-04-29 ENCOUNTER — Non-Acute Institutional Stay (SKILLED_NURSING_FACILITY): Payer: Self-pay | Admitting: Internal Medicine

## 2024-04-29 ENCOUNTER — Encounter: Payer: Self-pay | Admitting: Internal Medicine

## 2024-04-29 DIAGNOSIS — K5901 Slow transit constipation: Secondary | ICD-10-CM | POA: Diagnosis not present

## 2024-04-29 DIAGNOSIS — M25551 Pain in right hip: Secondary | ICD-10-CM

## 2024-04-29 DIAGNOSIS — E890 Postprocedural hypothyroidism: Secondary | ICD-10-CM | POA: Diagnosis not present

## 2024-04-29 DIAGNOSIS — F0390 Unspecified dementia without behavioral disturbance: Secondary | ICD-10-CM

## 2024-04-29 DIAGNOSIS — I890 Lymphedema, not elsewhere classified: Secondary | ICD-10-CM | POA: Diagnosis not present

## 2024-04-29 DIAGNOSIS — G8929 Other chronic pain: Secondary | ICD-10-CM

## 2024-04-29 DIAGNOSIS — I10 Essential (primary) hypertension: Secondary | ICD-10-CM | POA: Diagnosis not present

## 2024-04-29 DIAGNOSIS — N3946 Mixed incontinence: Secondary | ICD-10-CM | POA: Diagnosis not present

## 2024-04-29 NOTE — Progress Notes (Signed)
 Location:  [facility] Room Number: N25 A Place of Service:  SNF [phone]) Provider:  Marguerite Shiley, MD   Marguerite Shiley, MD  Patient Care Team: Marguerite Shiley, MD as PCP - General (Internal Medicine) Rosslyn Coons, MD as Consulting Physician (Endocrinology)  Extended Emergency Contact Information Primary Emergency Contact: Frick,Gordon Address: [address]          [place], [place] [address] [place] Home Phone: [phone] Mobile Phone: [phone] Relation: Spouse Secondary Emergency Contact: [name],[name] Address: [address]          [place], [place] [address] [place] Home Phone: [phone] Work Phone: [phone] Mobile Phone: [phone] Relation: Son  Code Status: DNR Goals of care: Advanced Directive information    04/13/2024   11:06 AM  Advanced Directives  Does Patient Have a Medical Advance Directive? Yes  Type of Estate agent of [place];Living will;Out of facility DNR (pink MOST or yellow form)  Does patient want to make changes to medical advance directive? No - Patient declined  Copy of Healthcare Power of Attorney in Chart? Yes - validated most recent copy scanned in chart (See row information)  Pre-existing out of facility DNR order (yellow form or pink MOST form) Pink MOST/Yellow Form most recent copy in chart - Physician notified to receive inpatient order     Chief Complaint  Patient presents with   Routine Visit    HPI:  Pt is a 89 y.o. female seen today for medical management of chronic diseases.   Lives in SNF in [facility] has h/o Hypertension, Hypothyroidism, Insomnia and Cognitive impairment. Also Has h/o Skin Cancer and Thyroid  Cancer  Urinary Incontinence Failed Pessary Right hip Pain Has severe arthritis Left Arm Lymphadenopathy     She is stable. No new Nursing issues. 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   Hypertension    Insomnia    Knee pain, bilateral 2014   Lymphedema    Left arm following mastectomy   Memory impairment    Memory loss    Migraine 05/16/2012   Migraine without aura, without mention of intractable migraine without mention of status migrainosus    patient denies at preop of 08/08/16    Other and unspecified hyperlipidemia    Other bursitis disorders    Other infective bursitis, left shoulder    Pain in joint, ankle and foot    Pain in joint, hand    Pain in joint, lower leg    Pain in joint, shoulder region    Pain in joint, shoulder region 04/23/2016   Pain in limb    Rectal bleeding 08/20/2016   Senile osteoporosis    Symptomatic menopausal or female climacteric states    Syncope and collapse    Unspecified hypertensive heart disease  without heart failure 1995   Unspecified hypothyroidism    Unspecified polyarthropathy or polyarthritis, site unspecified    Unspecified urinary incontinence    Unspecified visual loss    Xerophthalmia    Past Surgical History:  Procedure Laterality Date   ABDOMINAL HYSTERECTOMY     BILATERAL OOPHORECTOMY  1981   BREAST RECONSTRUCTION Left 01/2000   BREAST REDUCTION SURGERY Right 01/2000   CATARACT EXTRACTION Right 09/04/2005   COLONOSCOPY  01/2004   MASTECTOMY Left 10/1999   THYROID  LOBECTOMY Left 08/14/2016   Procedure: NEAR TOTAL THYROIDECTOMY;  Surgeon: Jacolyn Matar, MD;  Location: [facility];  Service: General;  Laterality:  Left;   TOTAL ABDOMINAL HYSTERECTOMY W/ BILATERAL SALPINGOOPHORECTOMY  1981   TOTAL KNEE ARTHROPLASTY Right 2014    Allergies  Allergen Reactions   Donepezil  Hcl     Body pain and stiffness    Namenda  [Memantine  Hcl]      Caused body pain    Sulfa Antibiotics    Sulfamethoxazole Rash    Outpatient Encounter Medications as of 04/29/2024  Medication Sig   acetaminophen  (TYLENOL ) 325 MG tablet Take 650 mg by mouth 3 (three) times daily.   amLODipine  (NORVASC ) 10 MG tablet Take 1 tablet (10 mg total) by mouth daily.   Cholecalciferol 5000 units capsule Take 5,000 Units by mouth daily.   diclofenac Sodium (VOLTAREN) 1 % GEL Apply 2 g topically daily at 12 noon.   guaiFENesin -dextromethorphan (ROBITUSSIN DM) 100-10 MG/5ML syrup Take 10 mLs by mouth every 6 (six) hours as needed for cough.   levothyroxine  (SYNTHROID ) 112 MCG tablet Take 112 mcg by mouth daily before breakfast.   lidocaine  (HM LIDOCAINE  PATCH) 4 % Place 2 patches onto the skin daily. Apply to lower back topically one time a day.   losartan  (COZAAR ) 100 MG tablet Take 100 mg by mouth daily.   melatonin 5 MG TABS Take 1 tablet (5 mg total) by mouth at bedtime.   Multiple Vitamins-Minerals (THEREMS-M) TABS Take 1 tablet by mouth every morning.   Omega-3 Fatty Acids (OMEGA-3 CF PO) Take 1,000 mg by mouth every morning. With food   Ostomy Supplies (SKIN PREP WIPES) MISC 2 Applications by Does not apply route as directed. Apply to heels topically for skin care treatment.   oxybutynin (DITROPAN XL) 15 MG 24 hr tablet Take 15 mg by mouth at bedtime.   Psyllium (REGULOID) 48.57 % POWD Take by mouth daily. 1tsp   senna-docusate (SENNA S) 8.6-50 MG tablet Take 2 tablets by mouth 2 (two) times a week. Give on Monday and Thursday   triamcinolone  (KENALOG ) 0.025 % cream Apply 1 application  topically as needed (Apply to affected area topically).   zinc oxide 20 % ointment Apply 1 application  topically as needed for irritation. Apply to  buttocks topically after every incontinent episode for redness   Menthol, Topical Analgesic, (BIOFREEZE) 4 % GEL Apply 1 Application topically as needed. Apply to lower legs topically as needed for pain. (Patient not taking: Reported on 04/29/2024)   No facility-administered encounter medications on file as of 04/29/2024.    Review of Systems  Constitutional:  Negative for activity change and appetite change.  HENT: Negative.    Respiratory:  Negative for cough and shortness of breath.   Cardiovascular:  Negative for leg swelling.  Gastrointestinal:  Negative for constipation.  Genitourinary: Negative.   Musculoskeletal:  Positive for arthralgias and gait problem. Negative for myalgias.  Skin: Negative.   Neurological:  Negative for dizziness and weakness.  Psychiatric/Behavioral:  Positive for confusion. Negative for dysphoric mood and sleep disturbance.     Immunization History  Administered Date(s) Administered   Fluad Quad(high Dose 65+) 10/02/2022   Influenza, High Dose Seasonal PF 09/17/2017, 09/10/2018, 09/22/2019, 09/13/2021, 10/08/2023   Influenza, Quadrivalent, Recombinant, Inj, Pf 08/29/2020   Influenza,inj,Quad PF,6+ Mos 09/10/2018   Influenza-Unspecified 08/10/2013, 09/07/2015, 09/13/2016   Moderna Covid-19 Vaccine Bivalent Booster 11yrs & up 05/22/2022   Moderna SARS-COV2 Booster Vaccination 05/16/2021   Moderna Sars-Covid-2 Vaccination 12/13/2019, 01/10/2020, 10/23/2020   PFIZER(Purple Top)SARS-COV-2 Vaccination 08/29/2021   Pfizer Covid-19 Vaccine Bivalent Booster 53yrs & up 10/15/2022   Pneumococcal Conjugate-13 06/08/2017, 11/02/2018   Pneumococcal Polysaccharide-23 12/09/2002, 06/08/2017, 10/22/2017, 11/02/2018   Pneumococcal-Unspecified 06/08/2017, 11/02/2018   Tdap 02/21/2014, 10/29/2017   Zoster Recombinant(Shingrix) 07/18/2017, 01/10/2018   Zoster, Live 07/18/2017, 01/10/2018   Pertinent  Health Maintenance Due  Topic Date Due   OPHTHALMOLOGY EXAM   05/28/2024   INFLUENZA VACCINE  07/09/2024   DEXA SCAN  Completed      03/18/2023    2:13 PM 07/18/2023    3:01 PM 11/17/2023   12:29 PM 02/22/2024    5:55 PM 02/27/2024    1:44 PM  Fall Risk  Falls in the past year? 0 1 0 0 0  Was there an injury with Fall? 0 0 0 0 0  Fall Risk Category Calculator 0 1 0 0 0  Patient at Risk for Falls Due to History of fall(s);Impaired balance/gait History of fall(s);Impaired balance/gait;Impaired mobility History of fall(s);Impaired balance/gait;Impaired mobility History of fall(s);Impaired balance/gait;Impaired mobility History of fall(s);Impaired mobility  Fall risk Follow up Falls evaluation completed Falls evaluation completed;Education provided;Falls prevention discussed Falls evaluation completed;Education provided;Falls prevention discussed Falls evaluation completed;Education provided Falls evaluation completed;Education provided   Functional Status Survey:    Vitals:   04/29/24 1205  BP: 137/72  Pulse: 74  Resp: 14  Temp: 97.9 F (36.6 C)  SpO2: 96%  Weight: 176 lb 3.2 oz (79.9 kg)  Height: 5\' 4"  (1.626 m)   Body mass index is 30.24 kg/m. Physical Exam Vitals reviewed.  Constitutional:      Appearance: Normal appearance.  HENT:     Head: Normocephalic.     Nose: Nose normal.     Mouth/Throat:     Mouth: Mucous membranes are moist.     Pharynx: Oropharynx is clear.  Eyes:     Pupils: Pupils are equal, round, and reactive to light.  Cardiovascular:     Rate and Rhythm: Normal rate and regular rhythm.     Pulses: Normal pulses.     Heart sounds: Normal heart sounds. No murmur heard. Pulmonary:     Effort: Pulmonary effort is normal.     Breath sounds: Normal breath sounds.  Abdominal:     General: Abdomen is flat. Bowel sounds are normal.     Palpations: Abdomen is soft.  Musculoskeletal:        General: No swelling.     Cervical back: Neck supple.     Comments: Some Swelling in Left arm  Also noticed ganglion in her Right  Arm  Skin:    General: Skin is warm.  Neurological:     General: No focal deficit present.     Mental Status: She is alert.  Psychiatric:        Mood and Affect: Mood normal.        Thought Content: Thought content normal.     Labs reviewed: Recent Labs    09/12/23 0000 04/13/24  0000  NA 133* 134*  K 4.5 4.5  CL 98* 100  CO2 22 27*  BUN 16 14  CREATININE 0.7 0.6  CALCIUM  9.3 9.6   Recent Labs    09/12/23 0000 04/13/24 0000  AST 14 13  ALT 10 9  ALKPHOS 68 71  ALBUMIN 3.9 4.1   Recent Labs    06/10/23 0000 09/12/23 0000 04/13/24 0000  WBC 8.4 6.2 6.4  NEUTROABS  --  3,900.00 3,917.00  HGB 13.9 11.9* 12.0  HCT 42 37 37  PLT 363 445* 450*   Lab Results  Component Value Date   TSH 11.54 (A) 04/13/2024   No results found for: "HGBA1C" Lab Results  Component Value Date   CHOL 190 07/26/2020   HDL 56 07/26/2020   LDLCALC 109 (H) 07/26/2020   TRIG 139 07/26/2020   CHOLHDL 3.4 07/26/2020    Significant Diagnostic Results in last 30 days:  No results found.  Assessment/Plan 1. Postoperative hypothyroidism (Primary) TSH was high Repeat Pending  2. Dementia without behavioral disturbance (HCC) Doing well in SNF  3. Essential hypertension Cozaar   4. Chronic pain of right hip Tylenol   5. Lymphedema Due to her Previous Breast surgery  6. Mixed stress and urge urinary incontinence On Ditropan  7. Slow transit constipation Senna  Noticed Mild Thrombocytosis Will follow  Family/ staff Communication:   Labs/tests ordered:

## 2024-05-25 ENCOUNTER — Telehealth: Payer: Self-pay | Admitting: Pharmacist

## 2024-05-25 DIAGNOSIS — L821 Other seborrheic keratosis: Secondary | ICD-10-CM | POA: Diagnosis not present

## 2024-05-25 DIAGNOSIS — L570 Actinic keratosis: Secondary | ICD-10-CM | POA: Diagnosis not present

## 2024-05-25 DIAGNOSIS — I10 Essential (primary) hypertension: Secondary | ICD-10-CM

## 2024-05-25 DIAGNOSIS — L814 Other melanin hyperpigmentation: Secondary | ICD-10-CM | POA: Diagnosis not present

## 2024-05-25 NOTE — Progress Notes (Signed)
   05/25/2024  Patient ID: Frances Holland, female   DOB: 12/22/1928, 89 y.o.   MRN: [id number]  Pharmacy Quality Measure Review  This patient is appearing on a report for being at risk of failing the adherence measure for hypertension (ACEi/ARB) medications this calendar year.   Medication: Losartan  100 mg Last fill date: 05/07/2024 for 30 day supply  Reviewed chart. Patient is in a nursing home. Losartan  100mg  was filled 05/07/24 by [facility]. They have been filling a 30 day supply monthly for the past months.  Will assume passing measure.  Geronimo Krabbe, PharmD, BCACP Clinical Pharmacist [phone]

## 2024-05-27 DIAGNOSIS — R946 Abnormal results of thyroid function studies: Secondary | ICD-10-CM | POA: Diagnosis not present

## 2024-05-28 LAB — TSH: TSH: 2.03 (ref 0.41–5.90)

## 2024-05-31 ENCOUNTER — Encounter: Payer: Self-pay | Admitting: Orthopedic Surgery

## 2024-05-31 ENCOUNTER — Non-Acute Institutional Stay (INDEPENDENT_AMBULATORY_CARE_PROVIDER_SITE_OTHER): Payer: Self-pay | Admitting: Orthopedic Surgery

## 2024-05-31 DIAGNOSIS — M810 Age-related osteoporosis without current pathological fracture: Secondary | ICD-10-CM | POA: Diagnosis not present

## 2024-05-31 DIAGNOSIS — F0390 Unspecified dementia without behavioral disturbance: Secondary | ICD-10-CM | POA: Diagnosis not present

## 2024-05-31 DIAGNOSIS — G8929 Other chronic pain: Secondary | ICD-10-CM

## 2024-05-31 DIAGNOSIS — I10 Essential (primary) hypertension: Secondary | ICD-10-CM

## 2024-05-31 DIAGNOSIS — K5901 Slow transit constipation: Secondary | ICD-10-CM | POA: Diagnosis not present

## 2024-05-31 DIAGNOSIS — E890 Postprocedural hypothyroidism: Secondary | ICD-10-CM | POA: Diagnosis not present

## 2024-05-31 DIAGNOSIS — M25551 Pain in right hip: Secondary | ICD-10-CM | POA: Diagnosis not present

## 2024-05-31 DIAGNOSIS — I890 Lymphedema, not elsewhere classified: Secondary | ICD-10-CM | POA: Diagnosis not present

## 2024-05-31 DIAGNOSIS — N3946 Mixed incontinence: Secondary | ICD-10-CM

## 2024-05-31 DIAGNOSIS — H6121 Impacted cerumen, right ear: Secondary | ICD-10-CM

## 2024-05-31 NOTE — Progress Notes (Signed)
 Location:  [facility] Room Number: N25A Place of Service:  SNF [phone]) Provider:  Gil Greig FORBES CARROLYN Frances Fredia LITTIE, MD  Patient Care Team: Frances Fredia LITTIE, MD as PCP - General (Internal Medicine) Nichole Senior, MD as Consulting Physician (Endocrinology)  Extended Emergency Contact Information Primary Emergency Contact: Holland,Frances Address: [address]          [place], [place] [address] [place] Home Phone: [phone] Mobile Phone: [phone] Relation: Spouse Secondary Emergency Contact: Holland,Frances Address: [address]          [place], [place] [address] [place] Home Phone: [phone] Work Phone: [phone] Mobile Phone: [phone] Relation: Son  Code Status:  DNR Goals of care: Advanced Directive information    05/31/2024   10:32 AM  Advanced Directives  Does Patient Have a Medical Advance Directive? Yes  Type of Estate agent of [place];Living will;Out of facility DNR (pink MOST or yellow form)  Does patient want to make changes to medical advance directive? No - Patient declined  Copy of Healthcare Power of Attorney in Chart? Yes - validated most recent copy scanned in chart (See row information)  Pre-existing out of facility DNR order (yellow form or pink MOST form) Pink MOST/Yellow Form most recent copy in chart - Physician notified to receive inpatient order     Chief Complaint  Patient presents with   Medical Management of Chronic Issues    Routine Visit    HPI:  Pt is a 89 y.o. female seen today for medical management of chronic diseases.    She currently resides on the skilled nursing unit at [facility]. PMH: HTN, migraines, rectal bleeding, hypothyroidism, s/p thyroidectomy (left) 2017, dementia, osteoporosis, constipation, h/o breast and skin cancer, abnormal gait and insomnia.    Hypothyroidism- s/p left lobe thyroidectomy 2017, TSH 2.03  (06/19)> was 11.52 (05/05), remains on levothyroxine  Dementia- MMSE 23/30 02/2022, BIMS score 06/15 (06/20)> was 12/15 (03/12), no behavioral outbursts, dependent with ADLs except feeding, unable to tolerate Namenda /Aricept /Exelon  in past, not on medication HTN- BUN/creat 14/0.57 04/12/2024, remains on losartan  and amlodipine  Chronic right leg pain- includes right hip and knee, past xrays suggest 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, remains on tylenol , voltaren gel and lidocaine  patches Lymphedema- s/p mastectomy 10/1999 Senile osteoporosis- DEXA 09/2021, remains on vitamin D  Urinary incontinence- stable with oxybutynin Constipation- remains on senna and psyllium  No recent falls or injuries.   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   Hypertension    Insomnia    Knee pain, bilateral 2014   Lymphedema    Left arm following mastectomy   Memory impairment    Memory loss    Migraine 05/16/2012   Migraine without aura, without mention of intractable migraine without mention of status migrainosus    patient denies at preop of 08/08/16    Other and unspecified hyperlipidemia    Other  bursitis disorders    Other infective bursitis, left shoulder    Pain in joint, ankle and foot    Pain in joint, hand    Pain in joint, lower leg    Pain in joint, shoulder region    Pain in joint, shoulder region 04/23/2016   Pain in limb    Rectal bleeding 08/20/2016   Senile osteoporosis    Symptomatic menopausal or female climacteric states    Syncope  and collapse    Unspecified hypertensive heart disease without heart failure 1995   Unspecified hypothyroidism    Unspecified polyarthropathy or polyarthritis, site unspecified    Unspecified urinary incontinence    Unspecified visual loss    Xerophthalmia    Past Surgical History:  Procedure Laterality Date   ABDOMINAL HYSTERECTOMY     BILATERAL OOPHORECTOMY  1981   BREAST RECONSTRUCTION Left 01/2000   BREAST REDUCTION SURGERY Right 01/2000   CATARACT EXTRACTION Right 09/04/2005   COLONOSCOPY  01/2004   MASTECTOMY Left 10/1999   THYROID  LOBECTOMY Left 08/14/2016   Procedure: NEAR TOTAL THYROIDECTOMY;  Surgeon: Donnice Lunger, MD;  Location: [facility];  Service: General;  Laterality: Left;   TOTAL ABDOMINAL HYSTERECTOMY W/ BILATERAL SALPINGOOPHORECTOMY  1981   TOTAL KNEE ARTHROPLASTY Right 2014    Allergies  Allergen Reactions   Donepezil  Hcl     Body pain and stiffness    Namenda  [Memantine  Hcl]      Caused body pain    Sulfa Antibiotics    Sulfamethoxazole Rash    Outpatient Encounter Medications as of 05/31/2024  Medication Sig   acetaminophen  (TYLENOL ) 325 MG tablet Take 650 mg by mouth 3 (three) times daily.   amLODipine  (NORVASC ) 10 MG tablet Take 1 tablet (10 mg total) by mouth daily.   Cholecalciferol 5000 units capsule Take 5,000 Units by mouth daily.   diclofenac Sodium (VOLTAREN) 1 % GEL Apply 2 g topically daily at 12 noon.   guaiFENesin -dextromethorphan (ROBITUSSIN DM) 100-10 MG/5ML syrup Take 10 mLs by mouth every 6 (six) hours as needed for cough.   levothyroxine  (SYNTHROID ) 112 MCG tablet Take 112 mcg by mouth daily before breakfast.   lidocaine  (HM LIDOCAINE  PATCH) 4 % Place 2 patches onto the skin daily. Apply to lower back topically one time a day.   losartan  (COZAAR ) 100 MG tablet Take 100 mg by mouth daily.   melatonin 5 MG TABS Take 1 tablet (5 mg total) by mouth at bedtime.   Multiple Vitamins-Minerals (THEREMS-M) TABS Take 1 tablet by mouth every morning.    Omega-3 Fatty Acids (OMEGA-3 CF PO) Take 1,000 mg by mouth every morning. With food   Ostomy Supplies (SKIN PREP WIPES) MISC 2 Applications by Does not apply route as directed. Apply to heels topically for skin care treatment.   oxybutynin (DITROPAN XL) 15 MG 24 hr tablet Take 15 mg by mouth at bedtime.   Psyllium (REGULOID) 48.57 % POWD Take by mouth daily. 1tsp   senna-docusate (SENNA S) 8.6-50 MG tablet Take 2 tablets by mouth 2 (two) times a week. Give on Monday and Thursday   triamcinolone  (KENALOG ) 0.025 % cream Apply 1 application  topically as needed (Apply to affected area topically).   zinc oxide 20 % ointment Apply 1 application  topically as needed for irritation. Apply to buttocks topically after every incontinent episode for redness   Menthol, Topical Analgesic, (BIOFREEZE) 4 % GEL Apply 1 Application topically as needed. Apply to lower legs topically as needed for pain. (Patient not  taking: Reported on 05/31/2024)   No facility-administered encounter medications on file as of 05/31/2024.    Review of Systems  Unable to perform ROS: Dementia    Immunization History  Administered Date(s) Administered   Fluad Quad(high Dose 65+) 10/02/2022   Influenza, High Dose Seasonal PF 09/17/2017, 09/10/2018, 09/22/2019, 09/13/2021, 10/08/2023   Influenza, Quadrivalent, Recombinant, Inj, Pf 08/29/2020   Influenza,inj,Quad PF,6+ Mos 09/10/2018   Influenza-Unspecified 08/10/2013, 09/07/2015, 09/13/2016   Moderna Covid-19 Vaccine Bivalent Booster 32yrs & up 05/22/2022   Moderna SARS-COV2 Booster Vaccination 05/16/2021   Moderna Sars-Covid-2 Vaccination 12/13/2019, 01/10/2020, 10/23/2020   PFIZER(Purple Top)SARS-COV-2 Vaccination 08/29/2021   Pfizer Covid-19 Vaccine Bivalent Booster 80yrs & up 10/15/2022   Pneumococcal Conjugate-13 06/08/2017, 11/02/2018   Pneumococcal Polysaccharide-23 12/09/2002, 06/08/2017, 10/22/2017, 11/02/2018   Pneumococcal-Unspecified 06/08/2017, 11/02/2018   Tdap  02/21/2014, 10/29/2017   Zoster Recombinant(Shingrix) 07/18/2017, 01/10/2018   Zoster, Live 07/18/2017, 01/10/2018   Pertinent  Health Maintenance Due  Topic Date Due   OPHTHALMOLOGY EXAM  05/28/2024   INFLUENZA VACCINE  07/09/2024   DEXA SCAN  Completed      03/18/2023    2:13 PM 07/18/2023    3:01 PM 11/17/2023   12:29 PM 02/22/2024    5:55 PM 02/27/2024    1:44 PM  Fall Risk  Falls in the past year? 0 1 0 0 0  Was there an injury with Fall? 0 0 0 0 0  Fall Risk Category Calculator 0 1 0 0 0  Patient at Risk for Falls Due to History of fall(s);Impaired balance/gait History of fall(s);Impaired balance/gait;Impaired mobility History of fall(s);Impaired balance/gait;Impaired mobility History of fall(s);Impaired balance/gait;Impaired mobility History of fall(s);Impaired mobility  Fall risk Follow up Falls evaluation completed Falls evaluation completed;Education provided;Falls prevention discussed Falls evaluation completed;Education provided;Falls prevention discussed Falls evaluation completed;Education provided Falls evaluation completed;Education provided   Functional Status Survey:    Vitals:   05/31/24 1026  BP: 117/71  Pulse: (!) 59  Resp: 18  Temp: (!) 97.3 F (36.3 C)  SpO2: 95%  Weight: 173 lb 11.2 oz (78.8 kg)  Height: 5' 4 (1.626 m)   Body mass index is 29.82 kg/m. Physical Exam Vitals reviewed.  Constitutional:      General: She is not in acute distress. HENT:     Head: Normocephalic.     Right Ear: There is impacted cerumen.     Left Ear: There is no impacted cerumen.     Nose: Nose normal.     Mouth/Throat:     Mouth: Mucous membranes are moist.   Eyes:     General:        Right eye: No discharge.        Left eye: No discharge.    Cardiovascular:     Rate and Rhythm: Normal rate and regular rhythm.     Pulses: Normal pulses.     Heart sounds: Normal heart sounds.  Pulmonary:     Effort: Pulmonary effort is normal. No respiratory distress.      Breath sounds: Normal breath sounds. No wheezing.  Abdominal:     General: There is no distension.     Palpations: Abdomen is soft.     Tenderness: There is no abdominal tenderness.   Musculoskeletal:     Cervical back: Neck supple.     Right lower leg: No edema.     Left lower leg: No edema.   Skin:    General: Skin is warm.     Capillary Refill: Capillary refill takes  less than 2 seconds.   Neurological:     General: No focal deficit present.     Mental Status: She is alert. Mental status is at baseline.     Motor: Weakness present.     Gait: Gait abnormal.   Psychiatric:        Mood and Affect: Mood normal.     Comments: Alert to self/familiar face, follows commands     Labs reviewed: Recent Labs    09/12/23 0000 04/13/24 0000  NA 133* 134*  K 4.5 4.5  CL 98* 100  CO2 22 27*  BUN 16 14  CREATININE 0.7 0.6  CALCIUM  9.3 9.6   Recent Labs    09/12/23 0000 04/13/24 0000  AST 14 13  ALT 10 9  ALKPHOS 68 71  ALBUMIN 3.9 4.1   Recent Labs    06/10/23 0000 09/12/23 0000 04/13/24 0000  WBC 8.4 6.2 6.4  NEUTROABS  --  3,900.00 3,917.00  HGB 13.9 11.9* 12.0  HCT 42 37 37  PLT 363 445* 450*   Lab Results  Component Value Date   TSH 11.54 (A) 04/13/2024   No results found for: HGBA1C Lab Results  Component Value Date   CHOL 190 07/26/2020   HDL 56 07/26/2020   LDLCALC 109 (H) 07/26/2020   TRIG 139 07/26/2020   CHOLHDL 3.4 07/26/2020    Significant Diagnostic Results in last 30 days:  No results found.  Assessment/Plan 1. Right ear impacted cerumen (Primary) - start Debrox 5 gtts at bedtime x 5 days - flush ears wen debrox complete  2. Postoperative hypothyroidism - TSH now 2.03 (06/19)> was 11.52 - medication now being given on empty stomach - cont levothyroxine   3. Dementia without behavioral disturbance (HCC) - no behaviors - dependent with ADLs except feeding - unsuccessful trial Aricept /Namenda /Exelon  - not on medication - cont  skilled nursing  4. Essential hypertension - controlled with amlodipine   5. Chronic pain of right hip - stable with tylenol  and voltaren gel  6. Lymphedema - s/p mastectomy 10/1999  7. Senile osteoporosis - DEXA 2022 - cont vitamin D   8. Mixed stress and urge urinary incontinence - cont oxybutynin  9. Slow transit constipation - cont senna and psyllium     Family/ staff Communication: plan discussed with patient and nurse  Labs/tests ordered:  none

## 2024-06-29 ENCOUNTER — Encounter: Payer: Self-pay | Admitting: Orthopedic Surgery

## 2024-06-29 ENCOUNTER — Non-Acute Institutional Stay (SKILLED_NURSING_FACILITY): Payer: Self-pay | Admitting: Orthopedic Surgery

## 2024-06-29 DIAGNOSIS — R2242 Localized swelling, mass and lump, left lower limb: Secondary | ICD-10-CM | POA: Diagnosis not present

## 2024-06-29 DIAGNOSIS — E890 Postprocedural hypothyroidism: Secondary | ICD-10-CM

## 2024-06-29 DIAGNOSIS — M25551 Pain in right hip: Secondary | ICD-10-CM

## 2024-06-29 DIAGNOSIS — N3946 Mixed incontinence: Secondary | ICD-10-CM | POA: Diagnosis not present

## 2024-06-29 DIAGNOSIS — I890 Lymphedema, not elsewhere classified: Secondary | ICD-10-CM

## 2024-06-29 DIAGNOSIS — L602 Onychogryphosis: Secondary | ICD-10-CM | POA: Diagnosis not present

## 2024-06-29 DIAGNOSIS — I10 Essential (primary) hypertension: Secondary | ICD-10-CM

## 2024-06-29 DIAGNOSIS — M79671 Pain in right foot: Secondary | ICD-10-CM | POA: Diagnosis not present

## 2024-06-29 DIAGNOSIS — F0390 Unspecified dementia without behavioral disturbance: Secondary | ICD-10-CM

## 2024-06-29 DIAGNOSIS — M79672 Pain in left foot: Secondary | ICD-10-CM | POA: Diagnosis not present

## 2024-06-29 DIAGNOSIS — M810 Age-related osteoporosis without current pathological fracture: Secondary | ICD-10-CM

## 2024-06-29 DIAGNOSIS — G8929 Other chronic pain: Secondary | ICD-10-CM | POA: Diagnosis not present

## 2024-06-29 NOTE — Progress Notes (Signed)
 Location:  [facility]  Nursing Home Room Number: N25-A Place of Service:  SNF [phone]) Provider: Gil No NP  Charlanne Fredia CROME, MD  Patient Care Team: Charlanne Fredia CROME, MD as PCP - General (Internal Medicine) Nichole Senior, MD as Consulting Physician (Endocrinology)  Extended Emergency Contact Information Primary Emergency Contact: Godby,Gordon Address: [address]          [place], [place] [address] [place] Home Phone: [phone] Mobile Phone: [phone] Relation: Spouse Secondary Emergency Contact: [name],[name] Address: [address]          [place], [place] [address] [place] Home Phone: [phone] Work Phone: [phone] Mobile Phone: [phone] Relation: Son  Code Status:  DNR Goals of care: Advanced Directive information    06/29/2024   10:15 AM  Advanced Directives  Does Patient Have a Medical Advance Directive? Yes  Type of Estate agent of [place];Out of facility DNR (pink MOST or yellow form);Living will  Does patient want to make changes to medical advance directive? No - Patient declined  Copy of Healthcare Power of Attorney in Chart? Yes - validated most recent copy scanned in chart (See row information)  Pre-existing out of facility DNR order (yellow form or pink MOST form) Pink MOST/Yellow Form most recent copy in chart - Physician notified to receive inpatient order     Chief Complaint  Patient presents with   Medical Management of Chronic Issues    Routine visit.     HPI:  Pt is a 89 y.o. female seen today for medical management of chronic diseases.    She currently resides on the skilled nursing unit at [facility]. PMH: HTN, migraines, rectal bleeding, hypothyroidism, s/p thyroidectomy (left) 2017, dementia, osteoporosis, constipation, h/o breast and skin cancer, abnormal gait and insomnia.     Dementia- MMSE 23/30 02/2022, BIMS score 06/15 (06/20)> was  12/15 (03/12), no behavioral outbursts, dependent with ADLs except feeding, unable to tolerate Namenda /Aricept /Exelon  in past, not on medication HTN- BUN/creat 14/0.57 04/12/2024, remains on losartan  and amlodipine  Hypothyroidism- s/p left lobe thyroidectomy 2017, TSH 2.03 (06/19)> was 11.52 (05/05), remains on levothyroxine  Chronic right leg pain- includes right hip and knee, past xrays suggest 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, remains on tylenol , voltaren gel and lidocaine  patches Lymphedema- s/p mastectomy 10/1999 Senile osteoporosis- DEXA 09/2021, remains on vitamin D  Urinary incontinence- stable with oxybutynin   No recent falls or injuries.   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   Hypertension    Insomnia    Knee pain, bilateral 2014   Lymphedema    Left arm following mastectomy   Memory impairment    Memory loss    Migraine 05/16/2012   Migraine without aura, without mention of intractable migraine without mention of status migrainosus    patient denies at preop of 08/08/16    Other and unspecified hyperlipidemia  Other bursitis disorders    Other infective bursitis, left shoulder    Pain in joint, ankle and foot    Pain in joint, hand    Pain in joint, lower leg    Pain in joint, shoulder region    Pain in joint, shoulder region 04/23/2016   Pain in limb    Rectal bleeding 08/20/2016   Senile osteoporosis    Symptomatic menopausal or female climacteric states    Syncope and collapse    Unspecified  hypertensive heart disease without heart failure 1995   Unspecified hypothyroidism    Unspecified polyarthropathy or polyarthritis, site unspecified    Unspecified urinary incontinence    Unspecified visual loss    Xerophthalmia    Past Surgical History:  Procedure Laterality Date   ABDOMINAL HYSTERECTOMY     BILATERAL OOPHORECTOMY  1981   BREAST RECONSTRUCTION Left 01/2000   BREAST REDUCTION SURGERY Right 01/2000   CATARACT EXTRACTION Right 09/04/2005   COLONOSCOPY  01/2004   MASTECTOMY Left 10/1999   THYROID  LOBECTOMY Left 08/14/2016   Procedure: NEAR TOTAL THYROIDECTOMY;  Surgeon: Donnice Lunger, MD;  Location: [facility];  Service: General;  Laterality: Left;   TOTAL ABDOMINAL HYSTERECTOMY W/ BILATERAL SALPINGOOPHORECTOMY  1981   TOTAL KNEE ARTHROPLASTY Right 2014    Allergies  Allergen Reactions   Donepezil  Hcl     Body pain and stiffness    Namenda  [Memantine  Hcl]      Caused body pain    Sulfa Antibiotics    Sulfamethoxazole Rash    Allergies as of 06/29/2024       Reactions   Donepezil  Hcl    Body pain and stiffness   Namenda  [memantine  Hcl]     Caused body pain    Sulfa Antibiotics    Sulfamethoxazole Rash        Medication List        Accurate as of June 29, 2024 10:36 AM. If you have any questions, ask your nurse or doctor.          acetaminophen  325 MG tablet Commonly known as: TYLENOL  Take 650 mg by mouth 3 (three) times daily.   acetaminophen  500 MG tablet Commonly known as: TYLENOL  Take 1,000 mg by mouth. Daily as needed.   amLODipine  10 MG tablet Commonly known as: NORVASC  Take 1 tablet (10 mg total) by mouth daily.   Cholecalciferol 125 MCG (5000 UT) capsule Take 5,000 Units by mouth daily.   diclofenac Sodium 1 % Gel Commonly known as: VOLTAREN Apply 2 g topically daily at 12 noon.   guaiFENesin -dextromethorphan 100-10 MG/5ML syrup Commonly known as: ROBITUSSIN DM Take 10 mLs by mouth every 6 (six) hours as needed for cough.   HM  Lidocaine  Patch 4 % Generic drug: lidocaine  Place 2 patches onto the skin daily. Apply to lower back topically one time a day.   levothyroxine  112 MCG tablet Commonly known as: SYNTHROID  Take 112 mcg by mouth daily before breakfast.   losartan  100 MG tablet Commonly known as: COZAAR  Take 100 mg by mouth daily.   melatonin 5 MG Tabs Take 1 tablet (5 mg total) by mouth at bedtime.   OMEGA-3 CF PO Take 1,000 mg by mouth every morning. With food   oxybutynin 15 MG 24 hr tablet Commonly known as: DITROPAN XL Take 15 mg by mouth at bedtime.   Reguloid 48.57 % Powd Generic drug: Psyllium Take by mouth daily. 1tsp   Senna S 8.6-50 MG tablet Generic drug: senna-docusate Take 2 tablets  by mouth 2 (two) times a week. Give on Monday and Thursday   Skin Prep Wipes Misc 2 Applications by Does not apply route as directed. Apply to heels topically for skin care treatment.   [place]-M Tabs Take 1 tablet by mouth every morning.   triamcinolone  0.025 % cream Commonly known as: KENALOG  Apply 1 application  topically as needed (Apply to affected area topically).   zinc oxide 20 % ointment Apply 1 application  topically as needed for irritation. Apply to buttocks topically after every incontinent episode for redness        Review of Systems  Unable to perform ROS: Dementia    Immunization History  Administered Date(s) Administered   Fluad Quad(high Dose 65+) 10/02/2022   Influenza, High Dose Seasonal PF 09/17/2017, 09/10/2018, 09/22/2019, 09/13/2021, 10/08/2023   Influenza, Quadrivalent, Recombinant, Inj, Pf 08/29/2020   Influenza,inj,Quad PF,6+ Mos 09/10/2018   Influenza-Unspecified 08/10/2013, 09/07/2015, 09/13/2016   Moderna Covid-19 Vaccine Bivalent Booster 51yrs & up 05/22/2022   Moderna SARS-COV2 Booster Vaccination 05/16/2021   Moderna Sars-Covid-2 Vaccination 12/13/2019, 01/10/2020, 10/23/2020   PFIZER(Purple Top)SARS-COV-2 Vaccination 08/29/2021   Pfizer Covid-19  Vaccine Bivalent Booster 67yrs & up 10/15/2022   Pneumococcal Conjugate-13 06/08/2017, 11/02/2018   Pneumococcal Polysaccharide-23 12/09/2002, 06/08/2017, 10/22/2017, 11/02/2018   Pneumococcal-Unspecified 06/08/2017, 11/02/2018   Tdap 02/21/2014, 10/29/2017   Zoster Recombinant(Shingrix) 07/18/2017, 01/10/2018   Zoster, Live 07/18/2017, 01/10/2018   Pertinent  Health Maintenance Due  Topic Date Due   OPHTHALMOLOGY EXAM  05/28/2024   INFLUENZA VACCINE  07/09/2024   DEXA SCAN  Completed      03/18/2023    2:13 PM 07/18/2023    3:01 PM 11/17/2023   12:29 PM 02/22/2024    5:55 PM 02/27/2024    1:44 PM  Fall Risk  Falls in the past year? 0 1 0 0 0  Was there an injury with Fall? 0 0 0 0 0  Fall Risk Category Calculator 0 1 0 0 0  Patient at Risk for Falls Due to History of fall(s);Impaired balance/gait History of fall(s);Impaired balance/gait;Impaired mobility History of fall(s);Impaired balance/gait;Impaired mobility History of fall(s);Impaired balance/gait;Impaired mobility History of fall(s);Impaired mobility  Fall risk Follow up Falls evaluation completed Falls evaluation completed;Education provided;Falls prevention discussed Falls evaluation completed;Education provided;Falls prevention discussed Falls evaluation completed;Education provided Falls evaluation completed;Education provided   Functional Status Survey:    Vitals:   06/29/24 1015  BP: 125/72  Pulse: 66  Resp: 15  Temp: (!) 97.3 F (36.3 C)  SpO2: 97%  Weight: 173 lb 3.2 oz (78.6 kg)  Height: 5' 4 (1.626 m)   Body mass index is 29.73 kg/m. Physical Exam Vitals reviewed.  Constitutional:      General: She is not in acute distress. HENT:     Head: Normocephalic.     Right Ear: There is no impacted cerumen.     Left Ear: There is no impacted cerumen.     Nose: Nose normal.     Mouth/Throat:     Mouth: Mucous membranes are moist.  Eyes:     General:        Right eye: No discharge.        Left eye: No  discharge.  Cardiovascular:     Rate and Rhythm: Normal rate and regular rhythm.     Pulses: Normal pulses.     Heart sounds: Normal heart sounds.  Pulmonary:     Effort: Pulmonary effort is normal.     Breath sounds: Normal breath sounds.  Abdominal:  General: Bowel sounds are normal. There is no distension.     Palpations: Abdomen is soft.     Tenderness: There is no abdominal tenderness.  Musculoskeletal:     Cervical back: Neck supple.     Right lower leg: No edema.     Left lower leg: Edema present.     Comments: LLE 1-2+ pitting edema/warmth, RLE no edema  Skin:    General: Skin is warm.     Capillary Refill: Capillary refill takes less than 2 seconds.  Neurological:     General: No focal deficit present.     Mental Status: She is alert. Mental status is at baseline.     Motor: Weakness present.     Gait: Gait abnormal.  Psychiatric:        Mood and Affect: Mood normal.     Labs reviewed: Recent Labs    09/12/23 0000 04/13/24 0000  NA 133* 134*  K 4.5 4.5  CL 98* 100  CO2 22 27*  BUN 16 14  CREATININE 0.7 0.6  CALCIUM  9.3 9.6   Recent Labs    09/12/23 0000 04/13/24 0000  AST 14 13  ALT 10 9  ALKPHOS 68 71  ALBUMIN 3.9 4.1   Recent Labs    09/12/23 0000 04/13/24 0000  WBC 6.2 6.4  NEUTROABS 3,900.00 3,917.00  HGB 11.9* 12.0  HCT 37 37  PLT 445* 450*   Lab Results  Component Value Date   TSH 2.03 05/28/2024   No results found for: HGBA1C Lab Results  Component Value Date   CHOL 190 07/26/2020   HDL 56 07/26/2020   LDLCALC 109 (H) 07/26/2020   TRIG 139 07/26/2020   CHOLHDL 3.4 07/26/2020    Significant Diagnostic Results in last 30 days:  No results found.  Assessment/Plan 1. Localized swelling of left lower leg (Primary) - LLE 1-2 pitting edema - h/o left breast cancer and lymphedema  - poor historian due to dementia - U/S doppler LLE  2. Dementia without behavioral disturbance (HCC) - no behaviors - dependent with ADLs  except feeding  - 3 lbs weigh loss  - no agitation - ambulates with w/c - not on medication  3. Essential hypertension - controlled with amlodipine   4. Postoperative hypothyroidism - TSH stable with levothyroxine   5. Chronic pain of right hip - cont tylenol    6. Lymphedema - s/p mastectomy 10/1999  7. Senile osteoporosis -  DEXA 2022 - cont vitamin D   8. Mixed stress and urge urinary incontinence - cont oxybutynin    Family/ staff Communication: plan discussed with patient, husband and nurse  Labs/tests ordered:  Doppler LLE

## 2024-06-30 ENCOUNTER — Other Ambulatory Visit: Payer: Self-pay | Admitting: Orthopedic Surgery

## 2024-06-30 DIAGNOSIS — M7989 Other specified soft tissue disorders: Secondary | ICD-10-CM | POA: Diagnosis not present

## 2024-07-01 ENCOUNTER — Non-Acute Institutional Stay (SKILLED_NURSING_FACILITY): Payer: Self-pay | Admitting: Internal Medicine

## 2024-07-01 DIAGNOSIS — F0390 Unspecified dementia without behavioral disturbance: Secondary | ICD-10-CM

## 2024-07-01 DIAGNOSIS — I824Y2 Acute embolism and thrombosis of unspecified deep veins of left proximal lower extremity: Secondary | ICD-10-CM | POA: Diagnosis not present

## 2024-07-02 MED ORDER — APIXABAN 5 MG PO TABS
ORAL_TABLET | ORAL | Status: AC
Start: 2024-07-02 — End: ?

## 2024-07-02 NOTE — Progress Notes (Signed)
 [facility] Service:  SNF (31)  Provider:   Code Status: DNR Goals of Care:     06/29/2024   10:15 AM  Advanced Directives  Does Patient Have a Medical Advance Directive? Yes  Type of Estate agent of [place];Out of facility DNR (pink MOST or yellow form);Living will  Does patient want to make changes to medical advance directive? No - Patient declined  Copy of Healthcare Power of Attorney in Chart? Yes - validated most recent copy scanned in chart (See row information)  Pre-existing out of facility DNR order (yellow form or pink MOST form) Pink MOST/Yellow Form most recent copy in chart - Physician notified to receive inpatient order     Chief Complaint  Patient presents with   Acute Visit    HPI: Patient is a 89 y.o. female seen today for an acute visit for Acute DVT  L[facility] in SNF [facility] has h/o Hypertension, Hypothyroidism, Insomnia and Cognitive impairment. Also Has h/o Skin Cancer and Thyroid  Cancer  Urinary Incontinence Failed Pessary Right hip Pain Has severe arthritis Left Arm Lymphadenopathy    Seen Yesterday for Routine and was found to have left leg swollen Dopplers Done Show Extensive Acute DVT in Left Super femoral, popliteal and posterior tibial veins  Patient does have a history of severe arthritis and does not walk.  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   Hypertension    Insomnia    Knee pain, bilateral 2014   Lymphedema    Left arm following mastectomy   Memory impairment    Memory loss    Migraine 05/16/2012   Migraine without aura, without  mention of intractable migraine without mention of status migrainosus    patient denies at preop of 08/08/16    Other and unspecified hyperlipidemia    Other bursitis disorders    Other infective bursitis, left shoulder    Pain in joint, ankle and foot    Pain in joint, hand    Pain in joint, lower leg    Pain in joint, shoulder region    Pain in joint, shoulder region 04/23/2016   Pain in limb    Rectal bleeding 08/20/2016   Senile osteoporosis    Symptomatic menopausal or female climacteric states    Syncope and collapse    Unspecified hypertensive heart disease without heart failure 1995   Unspecified hypothyroidism    Unspecified polyarthropathy or polyarthritis, site unspecified    Unspecified urinary incontinence    Unspecified visual loss    Xerophthalmia     Past Surgical History:  Procedure Laterality Date   ABDOMINAL HYSTERECTOMY     BILATERAL OOPHORECTOMY  1981   BREAST RECONSTRUCTION Left 01/2000   BREAST REDUCTION SURGERY Right 01/2000   CATARACT EXTRACTION Right 09/04/2005   COLONOSCOPY  01/2004   MASTECTOMY Left 10/1999   THYROID  LOBECTOMY Left 08/14/2016   Procedure: NEAR TOTAL THYROIDECTOMY;  Surgeon: Donnice Lunger, MD;  Location: [facility];  Service: General;  Laterality: Left;   TOTAL ABDOMINAL HYSTERECTOMY  W/ BILATERAL SALPINGOOPHORECTOMY  1981   TOTAL KNEE ARTHROPLASTY Right 2014    Allergies  Allergen Reactions   Donepezil  Hcl     Body pain and stiffness    Namenda  [Memantine  Hcl]      Caused body pain    Sulfa Antibiotics    Sulfamethoxazole Rash    Outpatient Encounter Medications as of 07/01/2024  Medication Sig   acetaminophen  (TYLENOL ) 325 MG tablet Take 650 mg by mouth 3 (three) times daily.   acetaminophen  (TYLENOL ) 500 MG tablet Take 1,000 mg by mouth. Daily as needed.   amLODipine  (NORVASC ) 10 MG tablet Take 1 tablet (10 mg total) by mouth daily.   Cholecalciferol 5000 units capsule Take 5,000 Units by mouth daily.   diclofenac Sodium  (VOLTAREN) 1 % GEL Apply 2 g topically daily at 12 noon.   guaiFENesin -dextromethorphan (ROBITUSSIN DM) 100-10 MG/5ML syrup Take 10 mLs by mouth every 6 (six) hours as needed for cough.   levothyroxine  (SYNTHROID ) 112 MCG tablet Take 112 mcg by mouth daily before breakfast.   lidocaine  (HM LIDOCAINE  PATCH) 4 % Place 2 patches onto the skin daily. Apply to lower back topically one time a day.   losartan  (COZAAR ) 100 MG tablet Take 100 mg by mouth daily.   melatonin 5 MG TABS Take 1 tablet (5 mg total) by mouth at bedtime.   Multiple Vitamins-Minerals (THEREMS-M) TABS Take 1 tablet by mouth every morning.   Omega-3 Fatty Acids (OMEGA-3 CF PO) Take 1,000 mg by mouth every morning. With food   oxybutynin (DITROPAN XL) 15 MG 24 hr tablet Take 15 mg by mouth at bedtime.   Psyllium (REGULOID) 48.57 % POWD Take by mouth daily. 1tsp   senna-docusate (SENNA S) 8.6-50 MG tablet Take 2 tablets by mouth 2 (two) times a week. Give on Monday and Thursday   triamcinolone  (KENALOG ) 0.025 % cream Apply 1 application  topically as needed (Apply to affected area topically).   zinc oxide 20 % ointment Apply 1 application  topically as needed for irritation. Apply to buttocks topically after every incontinent episode for redness   No facility-administered encounter medications on file as of 07/01/2024.    Review of Systems:  Review of Systems  Constitutional:  Negative for activity change and appetite change.  HENT: Negative.    Respiratory:  Negative for cough and shortness of breath.   Cardiovascular:  Positive for leg swelling.  Gastrointestinal:  Negative for constipation.  Genitourinary: Negative.   Musculoskeletal:  Positive for arthralgias and gait problem. Negative for myalgias.  Skin: Negative.   Neurological:  Negative for dizziness and weakness.  Psychiatric/Behavioral:  Positive for confusion. Negative for dysphoric mood and sleep disturbance.     Health Maintenance  Topic Date Due   COVID-19  Vaccine (7 - 2024-25 season) 08/10/2023   OPHTHALMOLOGY EXAM  05/28/2024   INFLUENZA VACCINE  07/09/2024   Medicare Annual Wellness (AWV)  02/26/2025   DTaP/Tdap/Td (3 - Td or Tdap) 10/30/2027   Pneumococcal Vaccine: 50+ Years  Completed   DEXA SCAN  Completed   Zoster Vaccines- Shingrix  Completed   Hepatitis B Vaccines  Aged Out   HPV VACCINES  Aged Out   Meningococcal B Vaccine  Aged Out    Physical Exam: There were no vitals filed for this visit. There is no height or weight on file to calculate BMI. Physical Exam Vitals reviewed.  Constitutional:      Appearance: Normal appearance.  HENT:     Head: Normocephalic.  Nose: Nose normal.     Mouth/Throat:     Mouth: Mucous membranes are moist.     Pharynx: Oropharynx is clear.  Eyes:     Pupils: Pupils are equal, round, and reactive to light.  Cardiovascular:     Rate and Rhythm: Normal rate and regular rhythm.     Pulses: Normal pulses.     Heart sounds: Normal heart sounds. No murmur heard. Pulmonary:     Effort: Pulmonary effort is normal.     Breath sounds: Normal breath sounds.  Abdominal:     General: Abdomen is flat. Bowel sounds are normal.     Palpations: Abdomen is soft.  Musculoskeletal:        General: No swelling.     Cervical back: Neck supple.     Comments: Edema in Left Leg  Skin:    General: Skin is warm.  Neurological:     General: No focal deficit present.     Mental Status: She is alert.  Psychiatric:        Mood and Affect: Mood normal.        Thought Content: Thought content normal.     Labs reviewed: Basic Metabolic Panel: Recent Labs    09/12/23 0000 10/24/23 0000 04/13/24 0000 05/28/24 0000  NA 133*  --  134*  --   K 4.5  --  4.5  --   CL 98*  --  100  --   CO2 22  --  27*  --   BUN 16  --  14  --   CREATININE 0.7  --  0.6  --   CALCIUM  9.3  --  9.6  --   TSH 15.93* 4.68 11.54* 2.03   Liver Function Tests: Recent Labs    09/12/23 0000 04/13/24 0000  AST 14 13  ALT  10 9  ALKPHOS 68 71  ALBUMIN 3.9 4.1   No results for input(s): LIPASE, AMYLASE in the last 8760 hours. No results for input(s): AMMONIA in the last 8760 hours. CBC: Recent Labs    09/12/23 0000 04/13/24 0000  WBC 6.2 6.4  NEUTROABS 3,900.00 3,917.00  HGB 11.9* 12.0  HCT 37 37  PLT 445* 450*   Lipid Panel: No results for input(s): CHOL, HDL, LDLCALC, TRIG, CHOLHDL, LDLDIRECT in the last 8760 hours. No results found for: HGBA1C  Procedures since last visit: No results found.  Assessment/Plan 1. Acute deep vein thrombosis (DVT) of proximal vein of left lower extremity (HCC) (Primary) Eliquis Starter pack   2. Dementia without behavioral disturbance (HCC) SNF level of care    Labs/tests ordered:  * No order type specified * Next appt:  Visit date not found

## 2024-07-28 ENCOUNTER — Non-Acute Institutional Stay (SKILLED_NURSING_FACILITY): Payer: Self-pay | Admitting: Orthopedic Surgery

## 2024-07-28 ENCOUNTER — Encounter: Payer: Self-pay | Admitting: Orthopedic Surgery

## 2024-07-28 DIAGNOSIS — M810 Age-related osteoporosis without current pathological fracture: Secondary | ICD-10-CM

## 2024-07-28 DIAGNOSIS — N3946 Mixed incontinence: Secondary | ICD-10-CM

## 2024-07-28 DIAGNOSIS — I890 Lymphedema, not elsewhere classified: Secondary | ICD-10-CM | POA: Diagnosis not present

## 2024-07-28 DIAGNOSIS — G8929 Other chronic pain: Secondary | ICD-10-CM | POA: Diagnosis not present

## 2024-07-28 DIAGNOSIS — I824Y2 Acute embolism and thrombosis of unspecified deep veins of left proximal lower extremity: Secondary | ICD-10-CM

## 2024-07-28 DIAGNOSIS — I10 Essential (primary) hypertension: Secondary | ICD-10-CM

## 2024-07-28 DIAGNOSIS — H6123 Impacted cerumen, bilateral: Secondary | ICD-10-CM | POA: Diagnosis not present

## 2024-07-28 DIAGNOSIS — E890 Postprocedural hypothyroidism: Secondary | ICD-10-CM

## 2024-07-28 DIAGNOSIS — F0390 Unspecified dementia without behavioral disturbance: Secondary | ICD-10-CM

## 2024-07-28 DIAGNOSIS — M25551 Pain in right hip: Secondary | ICD-10-CM

## 2024-07-28 NOTE — Progress Notes (Signed)
 Location:  [facility] Room Number: N25-A Place of Service:  SNF [phone]) Provider:  Greig Cluster, NP   Patient Care Team: Charlanne Fredia CROME, MD as PCP - General (Internal Medicine) Nichole Senior, MD as Consulting Physician (Endocrinology)  Extended Emergency Contact Information Primary Emergency Contact: Salsgiver,Gordon Address: [address]          [place], [place] [address] [place] Home Phone: [phone] Mobile Phone: [phone] Relation: Spouse Secondary Emergency Contact: [name],[name] Address: [address]          [place], [place] [address] [place] Home Phone: [phone] Work Phone: [phone] Mobile Phone: [phone] Relation: Son  Code Status:  DNR Goals of care: Advanced Directive information    06/29/2024   10:15 AM  Advanced Directives  Does Patient Have a Medical Advance Directive? Yes  Type of Estate agent of [place];Out of facility DNR (pink MOST or yellow form);Living will  Does patient want to make changes to medical advance directive? No - Patient declined  Copy of Healthcare Power of Attorney in Chart? Yes - validated most recent copy scanned in chart (See row information)  Pre-existing out of facility DNR order (yellow form or pink MOST form) Pink MOST/Yellow Form most recent copy in chart - Physician notified to receive inpatient order     Chief Complaint  Patient presents with   Routine Visit    HPI:  Pt is a 89 y.o. female seen today for medical management of chronic diseases.    She currently resides on the skilled nursing unit at [facility] PMH: HTN, migraines, rectal bleeding, hypothyroidism, s/p thyroidectomy (left) 2017, dementia, osteoporosis, constipation, h/o breast and skin cancer, abnormal gait and insomnia.    Acute DVT- 07/22 LLE swelling on exam, dopplers showed DVT in left superior femoral, popliteal and posterior tibial veins, start  on Eliquis  Dementia- MMSE 23/30 02/2022, BIMS score 06/15 (06/20)> was 12/15 (03/12), no behavioral outbursts, dependent with ADLs except feeding, unable to tolerate Namenda /Aricept /Exelon  in past, not on medication HTN- BUN/creat 14/0.57 04/12/2024, remains on losartan  and amlodipine  Hypothyroidism- s/p left lobe thyroidectomy 2017, TSH 2.03 (06/19)> was 11.52 (05/05), remains on levothyroxine  Chronic right leg pain- includes right hip and knee, past xrays suggest 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, remains on tylenol , voltaren gel and lidocaine  patches Lymphedema- s/p mastectomy 10/1999 Senile osteoporosis- DEXA 09/2021, remains on vitamin D  Urinary incontinence- stable with oxybutynin  Recent blood pressures:  08/19- 144/82  08/12- 153/82  08/05- 154/78  Recent weights:  08/01- 169.5 lbs  07/02- 173.2 lbs  06/01- 173.7 lbs   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   Hypertension    Insomnia    Knee pain, bilateral 2014   Lymphedema    Left arm following mastectomy   Memory impairment    Memory loss    Migraine 05/16/2012   Migraine without aura, without mention of intractable migraine without mention of status migrainosus    patient denies at preop of 08/08/16    Other and unspecified hyperlipidemia    Other bursitis disorders  Other infective bursitis, left shoulder    Pain in joint, ankle and foot    Pain in joint, hand    Pain in joint, lower leg    Pain in joint, shoulder region    Pain in joint, shoulder region 04/23/2016   Pain in limb    Rectal bleeding 08/20/2016   Senile osteoporosis    Symptomatic menopausal or female climacteric states    Syncope and  collapse    Unspecified hypertensive heart disease without heart failure 1995   Unspecified hypothyroidism    Unspecified polyarthropathy or polyarthritis, site unspecified    Unspecified urinary incontinence    Unspecified visual loss    Xerophthalmia    Past Surgical History:  Procedure Laterality Date   ABDOMINAL HYSTERECTOMY     BILATERAL OOPHORECTOMY  1981   BREAST RECONSTRUCTION Left 01/2000   BREAST REDUCTION SURGERY Right 01/2000   CATARACT EXTRACTION Right 09/04/2005   COLONOSCOPY  01/2004   MASTECTOMY Left 10/1999   THYROID  LOBECTOMY Left 08/14/2016   Procedure: NEAR TOTAL THYROIDECTOMY;  Surgeon: Donnice Lunger, MD;  Location: [facility];  Service: General;  Laterality: Left;   TOTAL ABDOMINAL HYSTERECTOMY W/ BILATERAL SALPINGOOPHORECTOMY  1981   TOTAL KNEE ARTHROPLASTY Right 2014    Allergies  Allergen Reactions   Donepezil  Hcl     Body pain and stiffness    Namenda  [Memantine  Hcl]      Caused body pain    Sulfa Antibiotics    Sulfamethoxazole Rash    Outpatient Encounter Medications as of 07/28/2024  Medication Sig   acetaminophen  (TYLENOL ) 325 MG tablet Take 650 mg by mouth 3 (three) times daily.   acetaminophen  (TYLENOL ) 500 MG tablet Take 1,000 mg by mouth. Daily as needed.   amLODipine  (NORVASC ) 10 MG tablet Take 1 tablet (10 mg total) by mouth daily.   apixaban  (ELIQUIS ) 5 MG TABS tablet Take 2 tablets (10mg ) twice daily for 7 days, then 1 tablet (5mg ) twice daily   Cholecalciferol 5000 units capsule Take 5,000 Units by mouth daily.   diclofenac Sodium (VOLTAREN) 1 % GEL Apply 2 g topically daily at 12 noon.   levothyroxine  (SYNTHROID ) 112 MCG tablet Take 112 mcg by mouth daily before breakfast.   lidocaine  (HM LIDOCAINE  PATCH) 4 % Place 2 patches onto the skin daily. Apply to lower back topically one time a day.   losartan  (COZAAR ) 100 MG tablet Take 100 mg by mouth daily.   melatonin 5 MG TABS Take 1 tablet (5 mg total) by mouth at bedtime.   Multiple  Vitamins-Minerals (THEREMS-M) TABS Take 1 tablet by mouth every morning.   Omega-3 Fatty Acids (OMEGA-3 CF PO) Take 1,000 mg by mouth every morning. With food   oxybutynin (DITROPAN XL) 15 MG 24 hr tablet Take 15 mg by mouth at bedtime.   Psyllium (REGULOID) 48.57 % POWD Take by mouth daily. 1tsp   senna-docusate (SENNA S) 8.6-50 MG tablet Take 2 tablets by mouth 2 (two) times a week. Give on Monday and Thursday   guaiFENesin -dextromethorphan (ROBITUSSIN DM) 100-10 MG/5ML syrup Take 10 mLs by mouth every 6 (six) hours as needed for cough.   triamcinolone  (KENALOG ) 0.025 % cream Apply 1 application  topically as needed (Apply to affected area topically).   zinc oxide 20 % ointment Apply 1 application  topically as needed for irritation. Apply to buttocks topically after every incontinent episode for redness   No facility-administered encounter medications on file as of 07/28/2024.    Review of Systems  Unable to perform ROS: Dementia    Immunization History  Administered Date(s) Administered   Fluad Quad(high Dose 65+) 10/02/2022   Influenza, High Dose Seasonal PF 09/17/2017, 09/10/2018, 09/22/2019, 09/13/2021, 10/08/2023   Influenza, Quadrivalent, Recombinant, Inj, Pf 08/29/2020   Influenza,inj,Quad PF,6+ Mos 09/10/2018   Influenza-Unspecified 08/10/2013, 09/07/2015, 09/13/2016   Moderna Covid-19 Vaccine Bivalent Booster 61yrs & up 05/22/2022   Moderna SARS-COV2 Booster Vaccination 05/16/2021   Moderna Sars-Covid-2 Vaccination 12/13/2019, 01/10/2020, 10/23/2020   PFIZER(Purple Top)SARS-COV-2 Vaccination 08/29/2021   Pfizer Covid-19 Vaccine Bivalent Booster 45yrs & up 10/15/2022   Pneumococcal Conjugate-13 06/08/2017, 11/02/2018   Pneumococcal Polysaccharide-23 12/09/2002, 06/08/2017, 10/22/2017, 11/02/2018   Pneumococcal-Unspecified 06/08/2017, 11/02/2018   Tdap 02/21/2014, 10/29/2017   Zoster Recombinant(Shingrix) 07/18/2017, 01/10/2018   Zoster, Live 07/18/2017, 01/10/2018    Pertinent  Health Maintenance Due  Topic Date Due   OPHTHALMOLOGY EXAM  05/28/2024   INFLUENZA VACCINE  07/09/2024   DEXA SCAN  Completed      03/18/2023    2:13 PM 07/18/2023    3:01 PM 11/17/2023   12:29 PM 02/22/2024    5:55 PM 02/27/2024    1:44 PM  Fall Risk  Falls in the past year? 0 1 0 0 0  Was there an injury with Fall? 0 0 0 0 0  Fall Risk Category Calculator 0 1 0 0 0  Patient at Risk for Falls Due to History of fall(s);Impaired balance/gait History of fall(s);Impaired balance/gait;Impaired mobility History of fall(s);Impaired balance/gait;Impaired mobility History of fall(s);Impaired balance/gait;Impaired mobility History of fall(s);Impaired mobility  Fall risk Follow up Falls evaluation completed Falls evaluation completed;Education provided;Falls prevention discussed Falls evaluation completed;Education provided;Falls prevention discussed Falls evaluation completed;Education provided Falls evaluation completed;Education provided   Functional Status Survey:    Vitals:   07/28/24 1013  BP: (!) 144/82  Pulse: 85  Resp: 20  Temp: 98.3 F (36.8 C)  SpO2: 97%  Weight: 169 lb 8 oz (76.9 kg)  Height: 5' 4 (1.626 m)   Body mass index is 29.09 kg/m. Physical Exam Vitals reviewed.  Constitutional:      General: She is not in acute distress. HENT:     Head: Normocephalic.     Right Ear: There is impacted cerumen.     Left Ear: There is impacted cerumen.     Nose: Nose normal.     Mouth/Throat:     Mouth: Mucous membranes are moist.  Eyes:     General:        Right eye: No discharge.        Left eye: No discharge.  Cardiovascular:     Rate and Rhythm: Normal rate and regular rhythm.     Pulses: Normal pulses.     Heart sounds: Normal heart sounds.  Pulmonary:     Effort: Pulmonary effort is normal. No respiratory distress.     Breath sounds: Normal breath sounds. No wheezing or rales.  Abdominal:     General: Bowel sounds are normal. There is no distension.      Palpations: Abdomen is soft.     Tenderness: There is no abdominal tenderness.  Musculoskeletal:     Cervical back: Neck supple.     Right lower leg: No edema.     Left lower leg: Edema present.  Skin:    General: Skin is warm.     Capillary Refill: Capillary refill takes less than 2 seconds.  Neurological:     General: No focal deficit present.     Mental Status: She is  alert. Mental status is at baseline.     Gait: Gait abnormal.  Psychiatric:        Mood and Affect: Mood normal.     Comments: aphasia     Labs reviewed: Recent Labs    09/12/23 0000 04/13/24 0000  NA 133* 134*  K 4.5 4.5  CL 98* 100  CO2 22 27*  BUN 16 14  CREATININE 0.7 0.6  CALCIUM  9.3 9.6   Recent Labs    09/12/23 0000 04/13/24 0000  AST 14 13  ALT 10 9  ALKPHOS 68 71  ALBUMIN 3.9 4.1   Recent Labs    09/12/23 0000 04/13/24 0000  WBC 6.2 6.4  NEUTROABS 3,900.00 3,917.00  HGB 11.9* 12.0  HCT 37 37  PLT 445* 450*   Lab Results  Component Value Date   TSH 2.03 05/28/2024   No results found for: HGBA1C Lab Results  Component Value Date   CHOL 190 07/26/2020   HDL 56 07/26/2020   LDLCALC 109 (H) 07/26/2020   TRIG 139 07/26/2020   CHOLHDL 3.4 07/26/2020    Significant Diagnostic Results in last 30 days:  No results found.  Assessment/Plan 1. Bilateral impacted cerumen (Primary) - start Debrox BID x 5 days - flush ears when Debrox done  2. Acute deep vein thrombosis (DVT) of proximal vein of left lower extremity (HCC) - 07/22 LLE swollen - dopplers confirmed  left superior femoral, popliteal and posterior tibial veins - LLE appears less swollen  - cont Eliquis   3. Dementia without behavioral disturbance (HCC) - no behaviors - dependent with ADLs except feeding - weight stable - not on medication  4. Essential hypertension - controlled with amlodipine  and losartan   5. Postoperative hypothyroidism - TSH stable - cont levothyroxine   6. Chronic pain of right  hip - cont tylenol    7. Lymphedema - s/p mastectomy 2000  8. Senile osteoporosis - cont vitamin D   9. Mixed stress and urge urinary incontinence - cont oxybutynin    Family/ staff Communication: plan discussed with patient and nurse  Labs/tests ordered:  none

## 2024-08-17 ENCOUNTER — Encounter: Payer: Self-pay | Admitting: Adult Health

## 2024-08-17 ENCOUNTER — Non-Acute Institutional Stay (SKILLED_NURSING_FACILITY): Payer: Self-pay | Admitting: Adult Health

## 2024-08-17 DIAGNOSIS — E890 Postprocedural hypothyroidism: Secondary | ICD-10-CM

## 2024-08-17 DIAGNOSIS — I10 Essential (primary) hypertension: Secondary | ICD-10-CM

## 2024-08-17 DIAGNOSIS — K047 Periapical abscess without sinus: Secondary | ICD-10-CM

## 2024-08-17 MED ORDER — AMOXICILLIN-POT CLAVULANATE 875-125 MG PO TABS: 1.0000 | ORAL_TABLET | Freq: Two times a day (BID) | ORAL | 0 refills | Status: AC

## 2024-08-17 NOTE — Progress Notes (Signed)
 Location:  [facility] Room Number: 25 A Place of Service:  SNF (31) Provider:  Medina-Vargas, Lauralei Clouse, DNP, FNP-BC  Patient Care Team: Charlanne Fredia CROME, MD as PCP - General (Internal Medicine) Nichole Senior, MD as Consulting Physician (Endocrinology)  Extended Emergency Contact Information Primary Emergency Contact: Manfre,Gordon Address: [address]          [place], [place] [address] [place] Home Phone: [phone] Mobile Phone: [phone] Relation: Spouse Secondary Emergency Contact: [name],[name] Address: [address]          [place], [place] [address] [place] Home Phone: [phone] Work Phone: [phone] Mobile Phone: [phone] Relation: Son  Code Status:   DNR  Goals of care: Advanced Directive information    06/29/2024   10:15 AM  Advanced Directives  Does Patient Have a Medical Advance Directive? Yes  Type of Estate agent of [place];Out of facility DNR (pink MOST or yellow form);Living will  Does patient want to make changes to medical advance directive? No - Patient declined  Copy of Healthcare Power of Attorney in Chart? Yes - validated most recent copy scanned in chart (See row information)  Pre-existing out of facility DNR order (yellow form or pink MOST form) Pink MOST/Yellow Form most recent copy in chart - Physician notified to receive inpatient order     Chief Complaint  Patient presents with   Acute Visit    Right jaw swollen    HPI:  Pt is a 89 y.o. female seen today for an acute visit regarding swollen right jaw. She is a resident of [facility] [address] Resident was noted to have right jaw swelling with slight erythema. Staff reported that her jaw was not swollen earlier this morning. Patient's right bottom molar gum is tender to touch. Per husband, she had a dental visit last month and there was no issues then. No noted lymphadenopathy, no  fever.  BP 143/72, takes Amlodipine  10 mg daily and Losartan  100 mg daily for hypertension.   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   Hypertension    Insomnia    Knee pain, bilateral 2014   Lymphedema    Left arm following mastectomy   Memory impairment    Memory loss    Migraine 05/16/2012   Migraine without aura, without mention of intractable migraine without mention of status migrainosus    patient denies at preop of 08/08/16    Other and unspecified hyperlipidemia    Other bursitis disorders    Other infective bursitis, left shoulder    Pain in joint, ankle and foot    Pain in joint, hand    Pain in joint, lower leg    Pain in joint, shoulder region    Pain in joint, shoulder region 04/23/2016   Pain in limb    Rectal bleeding 08/20/2016   Senile osteoporosis    Symptomatic menopausal or female climacteric states    Syncope and collapse    Unspecified hypertensive heart disease without heart failure 1995   Unspecified hypothyroidism    Unspecified  polyarthropathy or polyarthritis, site unspecified    Unspecified urinary incontinence    Unspecified visual loss    Xerophthalmia    Past Surgical History:  Procedure Laterality Date   ABDOMINAL HYSTERECTOMY     BILATERAL OOPHORECTOMY  1981   BREAST RECONSTRUCTION Left 01/2000   BREAST REDUCTION SURGERY Right 01/2000   CATARACT EXTRACTION Right 09/04/2005   COLONOSCOPY  01/2004   MASTECTOMY Left 10/1999   THYROID  LOBECTOMY Left 08/14/2016   Procedure: NEAR TOTAL THYROIDECTOMY;  Surgeon: Donnice Lunger, MD;  Location: [facility];  Service: General;  Laterality: Left;   TOTAL ABDOMINAL HYSTERECTOMY W/ BILATERAL  SALPINGOOPHORECTOMY  1981   TOTAL KNEE ARTHROPLASTY Right 2014    Allergies  Allergen Reactions   Donepezil  Hcl     Body pain and stiffness    Namenda  [Memantine  Hcl]      Caused body pain    Sulfa Antibiotics    Sulfamethoxazole Rash    Outpatient Encounter Medications as of 08/17/2024  Medication Sig   amoxicillin -clavulanate (AUGMENTIN ) 875-125 MG tablet Take 1 tablet by mouth 2 (two) times daily for 5 days.   acetaminophen  (TYLENOL ) 325 MG tablet Take 650 mg by mouth 3 (three) times daily.   acetaminophen  (TYLENOL ) 500 MG tablet Take 1,000 mg by mouth. Daily as needed.   amLODipine  (NORVASC ) 10 MG tablet Take 1 tablet (10 mg total) by mouth daily.   apixaban  (ELIQUIS ) 5 MG TABS tablet Take 2 tablets (10mg ) twice daily for 7 days, then 1 tablet (5mg ) twice daily   Cholecalciferol 5000 units capsule Take 5,000 Units by mouth daily.   diclofenac Sodium (VOLTAREN) 1 % GEL Apply 2 g topically daily at 12 noon.   guaiFENesin -dextromethorphan (ROBITUSSIN DM) 100-10 MG/5ML syrup Take 10 mLs by mouth every 6 (six) hours as needed for cough.   levothyroxine  (SYNTHROID ) 112 MCG tablet Take 112 mcg by mouth daily before breakfast.   lidocaine  (HM LIDOCAINE  PATCH) 4 % Place 2 patches onto the skin daily. Apply to lower back topically one time a day.   losartan  (COZAAR ) 100 MG tablet Take 100 mg by mouth daily.   melatonin 5 MG TABS Take 1 tablet (5 mg total) by mouth at bedtime.   Multiple Vitamins-Minerals (THEREMS-M) TABS Take 1 tablet by mouth every morning.   Omega-3 Fatty Acids (OMEGA-3 CF PO) Take 1,000 mg by mouth every morning. With food   oxybutynin (DITROPAN XL) 15 MG 24 hr tablet Take 15 mg by mouth at bedtime.   Psyllium (REGULOID) 48.57 % POWD Take by mouth daily. 1tsp   senna-docusate (SENNA S) 8.6-50 MG tablet Take 2 tablets by mouth 2 (two) times a week. Give on Monday and Thursday   triamcinolone  (KENALOG ) 0.025 % cream Apply 1 application  topically as needed (Apply to  affected area topically).   zinc oxide 20 % ointment Apply 1 application  topically as needed for irritation. Apply to buttocks topically after every incontinent episode for redness   No facility-administered encounter medications on file as of 08/17/2024.    Review of Systems  Unable to obtain due to dementia.    Immunization History  Administered Date(s) Administered   Fluad Quad(high Dose 65+) 10/02/2022   INFLUENZA, HIGH DOSE SEASONAL PF 09/17/2017, 09/10/2018, 09/22/2019, 09/13/2021, 10/08/2023   Influenza, Quadrivalent, Recombinant, Inj, Pf 08/29/2020   Influenza,inj,Quad PF,6+ Mos 09/10/2018   Influenza-Unspecified 08/10/2013, 09/07/2015, 09/13/2016   Moderna Covid-19 Vaccine Bivalent Booster 53yrs & up 05/22/2022   Moderna SARS-COV2 Booster Vaccination 05/16/2021   Moderna Sars-Covid-2  Vaccination 12/13/2019, 01/10/2020, 10/23/2020   PFIZER(Purple Top)SARS-COV-2 Vaccination 08/29/2021   Pfizer Covid-19 Vaccine Bivalent Booster 32yrs & up 10/15/2022   Pneumococcal Conjugate-13 06/08/2017, 11/02/2018   Pneumococcal Polysaccharide-23 12/09/2002, 06/08/2017, 10/22/2017, 11/02/2018   Pneumococcal-Unspecified 06/08/2017, 11/02/2018   Tdap 02/21/2014, 10/29/2017   Zoster Recombinant(Shingrix) 07/18/2017, 01/10/2018   Zoster, Live 07/18/2017, 01/10/2018   Pertinent  Health Maintenance Due  Topic Date Due   OPHTHALMOLOGY EXAM  05/28/2024   Influenza Vaccine  07/09/2024   DEXA SCAN  Completed      03/18/2023    2:13 PM 07/18/2023    3:01 PM 11/17/2023   12:29 PM 02/22/2024    5:55 PM 02/27/2024    1:44 PM  Fall Risk  Falls in the past year? 0 1 0 0 0  Was there an injury with Fall? 0 0 0 0 0  Fall Risk Category Calculator 0 1 0 0 0  Patient at Risk for Falls Due to History of fall(s);Impaired balance/gait History of fall(s);Impaired balance/gait;Impaired mobility History of fall(s);Impaired balance/gait;Impaired mobility History of fall(s);Impaired balance/gait;Impaired mobility  History of fall(s);Impaired mobility  Fall risk Follow up Falls evaluation completed Falls evaluation completed;Education provided;Falls prevention discussed Falls evaluation completed;Education provided;Falls prevention discussed Falls evaluation completed;Education provided Falls evaluation completed;Education provided     Vitals:   08/17/24 1048  BP: (!) 143/72  Pulse: 93  Temp: 97.9 F (36.6 C)  SpO2: 96%  Weight: 169 lb 9.6 oz (76.9 kg)  Height: 5' 5 (1.651 m)   Body mass index is 28.22 kg/m.  Physical Exam Constitutional:      Appearance: Normal appearance.  HENT:     Head: Normocephalic and atraumatic.     Comments: Right jaw area is swollen and slightly erythematous    Nose: Nose normal.     Mouth/Throat:     Mouth: Mucous membranes are moist.  Eyes:     Conjunctiva/sclera: Conjunctivae normal.  Cardiovascular:     Rate and Rhythm: Normal rate and regular rhythm.  Pulmonary:     Effort: Pulmonary effort is normal.     Breath sounds: Normal breath sounds.  Abdominal:     General: Bowel sounds are normal.     Palpations: Abdomen is soft.  Musculoskeletal:        General: Normal range of motion.     Cervical back: Normal range of motion.  Skin:    General: Skin is warm and dry.  Neurological:     Mental Status: She is alert.  Psychiatric:        Mood and Affect: Mood normal.        Behavior: Behavior normal.        Labs reviewed: Recent Labs    09/12/23 0000 04/13/24 0000  NA 133* 134*  K 4.5 4.5  CL 98* 100  CO2 22 27*  BUN 16 14  CREATININE 0.7 0.6  CALCIUM  9.3 9.6   Recent Labs    09/12/23 0000 04/13/24 0000  AST 14 13  ALT 10 9  ALKPHOS 68 71  ALBUMIN 3.9 4.1   Recent Labs    09/12/23 0000 04/13/24 0000  WBC 6.2 6.4  NEUTROABS 3,900.00 3,917.00  HGB 11.9* 12.0  HCT 37 37  PLT 445* 450*   Lab Results  Component Value Date   TSH 2.03 05/28/2024   No results found for: HGBA1C Lab Results  Component Value Date   CHOL  190 07/26/2020   HDL 56 07/26/2020   LDLCALC 109 (H) 07/26/2020   TRIG  139 07/26/2020   CHOLHDL 3.4 07/26/2020    Significant Diagnostic Results in last 30 days:  No results found.  Assessment/Plan  1. Tooth infection (Primary) -  right molar gum tenderness -  will start Augmentin  875-125 mg BID X 5 days -  dental consult - amoxicillin -clavulanate (AUGMENTIN ) 875-125 MG tablet; Take 1 tablet by mouth 2 (two) times daily for 5 days.  Dispense: 10 tablet; Refill: 0  2. Essential hypertension -  stable -  continue Amlodipine  10 mg daily -  continue Losartan  100 mg daily  3. Postoperative hypothyroidism Lab Results  Component Value Date   TSH 2.03 05/28/2024    -  continue Levothyroxine  112 mcg daily    Family/ staff Communication: Discussed plan of care with resident, husband and charge nurse  Labs/tests ordered: None    Xandra Laramee Medina-Vargas, DNP, MSN, FNP-BC [facility] [phone] (Monday-Friday 8:00 a.m. - 5:00 p.m.) [phone] (after hours)

## 2024-08-24 DIAGNOSIS — L570 Actinic keratosis: Secondary | ICD-10-CM | POA: Diagnosis not present

## 2024-09-01 ENCOUNTER — Encounter: Payer: Self-pay | Admitting: Orthopedic Surgery

## 2024-09-01 ENCOUNTER — Non-Acute Institutional Stay (SKILLED_NURSING_FACILITY): Payer: Self-pay | Admitting: Orthopedic Surgery

## 2024-09-01 DIAGNOSIS — I824Y2 Acute embolism and thrombosis of unspecified deep veins of left proximal lower extremity: Secondary | ICD-10-CM

## 2024-09-01 DIAGNOSIS — G8929 Other chronic pain: Secondary | ICD-10-CM | POA: Diagnosis not present

## 2024-09-01 DIAGNOSIS — N3946 Mixed incontinence: Secondary | ICD-10-CM

## 2024-09-01 DIAGNOSIS — M810 Age-related osteoporosis without current pathological fracture: Secondary | ICD-10-CM | POA: Diagnosis not present

## 2024-09-01 DIAGNOSIS — K047 Periapical abscess without sinus: Secondary | ICD-10-CM | POA: Diagnosis not present

## 2024-09-01 DIAGNOSIS — I10 Essential (primary) hypertension: Secondary | ICD-10-CM

## 2024-09-01 DIAGNOSIS — M25551 Pain in right hip: Secondary | ICD-10-CM | POA: Diagnosis not present

## 2024-09-01 DIAGNOSIS — F0390 Unspecified dementia without behavioral disturbance: Secondary | ICD-10-CM | POA: Diagnosis not present

## 2024-09-01 DIAGNOSIS — I890 Lymphedema, not elsewhere classified: Secondary | ICD-10-CM | POA: Diagnosis not present

## 2024-09-01 NOTE — Progress Notes (Signed)
 Location:  [facility] Room Number: 25 A Place of Service:  SNF [phone]) Provider: Gil Greig BRAVO, NP   Charlanne Fredia CROME, MD  Patient Care Team: Charlanne Fredia CROME, MD as PCP - General (Internal Medicine) Nichole Senior, MD as Consulting Physician (Endocrinology)  Extended Emergency Contact Information Primary Emergency Contact: Fly,Gordon Address: [address], [address]          [place], [place] [address] [place] Home Phone: [phone] Mobile Phone: [phone] Relation: Spouse Secondary Emergency Contact: [name],[name] Address: [address]          [place], [place] [address] [place] Home Phone: [phone] Work Phone: [phone] Mobile Phone: [phone] Relation: Son  Code Status:  DNR Goals of care: Advanced Directive information    06/29/2024   10:15 AM  Advanced Directives  Does Patient Have a Medical Advance Directive? Yes  Type of Estate agent of [place];Out of facility DNR (pink MOST or yellow form);Living will  Does patient want to make changes to medical advance directive? No - Patient declined  Copy of Healthcare Power of Attorney in Chart? Yes - validated most recent copy scanned in chart (See row information)  Pre-existing out of facility DNR order (yellow form or pink MOST form) Pink MOST/Yellow Form most recent copy in chart - Physician notified to receive inpatient order     No chief complaint on file.   HPI:  Pt is a 89 y.o. female seen today for medical management of chronic diseases.    She currently resides on the skilled nursing unit at [facility]. PMH: HTN, migraines, rectal bleeding, hypothyroidism, s/p thyroidectomy (left) 2017, dementia, osteoporosis, constipation, h/o breast and skin cancer, abnormal gait and insomnia.   Tooth infection- 09/09 increased pain and swelling to right molar, completed Augmentin   x 5 days, 09/12 dental exam > chlorhexidine   rinses started  DVT- 07/22 LLE swelling on exam, dopplers showed DVT in left superior femoral, popliteal and posterior tibial veins, remains on Eliquis  Dementia- MMSE 23/30 02/2022, BIMS score 06/15 (09/17)> was 06/15 (06/20), no behavioral outbursts, dependent with ADLs except feeding, skin picking on face, unable to tolerate Namenda /Aricept /Exelon  in past, not on medication HTN- BUN/creat 14/0.57 04/12/2024, remains on losartan  and amlodipine  Hypothyroidism- s/p left lobe thyroidectomy 2017, TSH 2.03 (06/19)> was 11.52 (05/05), remains on levothyroxine  Chronic right leg pain- includes right hip and knee, past xrays suggest 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, remains on tylenol , voltaren gel and lidocaine  patches Lymphedema- s/p mastectomy 10/1999 Senile osteoporosis- DEXA 09/2021, remains on vitamin D  Urinary incontinence- stable with oxybutynin  Recent blood pressures:  09/23- 125/80  09/16- 116/77  09/09- 126/66  Recent weights:  09/01- 169.6 lbs  08/01- 169.5 lbs  07/02- 173.2 lbs    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   Hypertension    Insomnia    Knee pain, bilateral 2014   Lymphedema    Left arm following mastectomy   Memory impairment    Memory loss    Migraine 05/16/2012   Migraine without aura, without mention of intractable migraine  without mention of status migrainosus    patient denies at preop of 08/08/16    Other and unspecified hyperlipidemia    Other bursitis disorders    Other infective bursitis, left shoulder    Pain in joint, ankle and foot    Pain in joint, hand    Pain in joint, lower leg    Pain in joint, shoulder region    Pain in joint,  shoulder region 04/23/2016   Pain in limb    Rectal bleeding 08/20/2016   Senile osteoporosis    Symptomatic menopausal or female climacteric states    Syncope and collapse    Unspecified hypertensive heart disease without heart failure 1995   Unspecified hypothyroidism    Unspecified polyarthropathy or polyarthritis, site unspecified    Unspecified urinary incontinence    Unspecified visual loss    Xerophthalmia    Past Surgical History:  Procedure Laterality Date   ABDOMINAL HYSTERECTOMY     BILATERAL OOPHORECTOMY  1981   BREAST RECONSTRUCTION Left 01/2000   BREAST REDUCTION SURGERY Right 01/2000   CATARACT EXTRACTION Right 09/04/2005   COLONOSCOPY  01/2004   MASTECTOMY Left 10/1999   THYROID  LOBECTOMY Left 08/14/2016   Procedure: NEAR TOTAL THYROIDECTOMY;  Surgeon: Donnice Lunger, MD;  Location: [facility];  Service: General;  Laterality: Left;   TOTAL ABDOMINAL HYSTERECTOMY W/ BILATERAL SALPINGOOPHORECTOMY  1981   TOTAL KNEE ARTHROPLASTY Right 2014    Allergies  Allergen Reactions   Donepezil  Hcl     Body pain and stiffness    Namenda  [Memantine  Hcl]      Caused body pain    Sulfa Antibiotics    Sulfamethoxazole Rash    Outpatient Encounter Medications as of 09/01/2024  Medication Sig   acetaminophen  (TYLENOL ) 325 MG tablet Take 650 mg by mouth 3 (three) times daily.   acetaminophen  (TYLENOL ) 500 MG tablet Take 1,000 mg by mouth. Daily as needed.   amLODipine  (NORVASC ) 10 MG tablet Take 1 tablet (10 mg total) by mouth daily.   apixaban  (ELIQUIS ) 5 MG TABS tablet Take 2 tablets (10mg ) twice daily for 7 days, then 1 tablet (5mg ) twice daily   chlorhexidine  (PERIDEX ) 0.12 % solution Use as directed 5 mLs in the mouth or throat as directed. Give 5 ml by mouth at bedtime for mouth care Brush on 1 tsp of solution to teeth and gums with a toothbrush after PM mouth care. Spit out excess and do not rinse   Cholecalciferol 5000 units capsule Take 5,000 Units by mouth daily.    diclofenac Sodium (VOLTAREN) 1 % GEL Apply 2 g topically daily at 12 noon.   guaiFENesin -dextromethorphan (ROBITUSSIN DM) 100-10 MG/5ML syrup Take 10 mLs by mouth every 6 (six) hours as needed for cough.   levothyroxine  (SYNTHROID ) 112 MCG tablet Take 112 mcg by mouth daily before breakfast.   lidocaine  (HM LIDOCAINE  PATCH) 4 % Place 2 patches onto the skin daily. Apply to lower back topically one time a day.   losartan  (COZAAR ) 100 MG tablet Take 100 mg by mouth daily.   melatonin 5 MG TABS Take 1 tablet (5 mg total) by mouth at bedtime.   Multiple Vitamins-Minerals (THEREMS-M) TABS Take 1 tablet by mouth every morning.   Omega-3 Fatty Acids (OMEGA-3 CF PO) Take 1,000 mg by mouth every morning. With food   oxybutynin (DITROPAN XL) 15 MG 24 hr tablet Take 15 mg by mouth at bedtime.   Psyllium (REGULOID) 48.57 % POWD Take by mouth daily. 1tsp   senna-docusate (  SENNA S) 8.6-50 MG tablet Take 2 tablets by mouth 2 (two) times a week. Give on Monday and Thursday   triamcinolone  (KENALOG ) 0.025 % cream Apply 1 application  topically as needed (Apply to affected area topically).   zinc oxide 20 % ointment Apply 1 application  topically as needed for irritation. Apply to buttocks topically after every incontinent episode for redness   No facility-administered encounter medications on file as of 09/01/2024.    Review of Systems  Unable to perform ROS: Dementia    Immunization History  Administered Date(s) Administered   Fluad Quad(high Dose 65+) 10/02/2022   INFLUENZA, HIGH DOSE SEASONAL PF 09/17/2017, 09/10/2018, 09/22/2019, 09/13/2021, 10/08/2023   Influenza, Quadrivalent, Recombinant, Inj, Pf 08/29/2020   Influenza,inj,Quad PF,6+ Mos 09/10/2018   Influenza-Unspecified 08/10/2013, 09/07/2015, 09/13/2016   Moderna Covid-19 Vaccine Bivalent Booster 60yrs & up 05/22/2022   Moderna SARS-COV2 Booster Vaccination 05/16/2021   Moderna Sars-Covid-2 Vaccination 12/13/2019, 01/10/2020, 10/23/2020    PFIZER(Purple Top)SARS-COV-2 Vaccination 08/29/2021   Pfizer Covid-19 Vaccine Bivalent Booster 81yrs & up 10/15/2022   Pneumococcal Conjugate-13 06/08/2017, 11/02/2018   Pneumococcal Polysaccharide-23 12/09/2002, 06/08/2017, 10/22/2017, 11/02/2018   Pneumococcal-Unspecified 06/08/2017, 11/02/2018   Tdap 02/21/2014, 10/29/2017   Zoster Recombinant(Shingrix) 07/18/2017, 01/10/2018   Zoster, Live 07/18/2017, 01/10/2018   Pertinent  Health Maintenance Due  Topic Date Due   OPHTHALMOLOGY EXAM  05/28/2024   Influenza Vaccine  07/09/2024   DEXA SCAN  Completed      03/18/2023    2:13 PM 07/18/2023    3:01 PM 11/17/2023   12:29 PM 02/22/2024    5:55 PM 02/27/2024    1:44 PM  Fall Risk  Falls in the past year? 0 1 0 0 0  Was there an injury with Fall? 0 0 0 0 0  Fall Risk Category Calculator 0 1 0 0 0  Patient at Risk for Falls Due to History of fall(s);Impaired balance/gait History of fall(s);Impaired balance/gait;Impaired mobility History of fall(s);Impaired balance/gait;Impaired mobility History of fall(s);Impaired balance/gait;Impaired mobility History of fall(s);Impaired mobility  Fall risk Follow up Falls evaluation completed Falls evaluation completed;Education provided;Falls prevention discussed Falls evaluation completed;Education provided;Falls prevention discussed Falls evaluation completed;Education provided Falls evaluation completed;Education provided   Functional Status Survey:    Vitals:   09/01/24 1021  BP: 125/80  Pulse: 77  Resp: 19  Temp: 98.1 F (36.7 C)  SpO2: 96%  Weight: 169 lb 9.6 oz (76.9 kg)  Height: 5' 5 (1.651 m)   Body mass index is 28.22 kg/m. Physical Exam Vitals reviewed.  Constitutional:      General: She is not in acute distress. HENT:     Head: Normocephalic.     Right Ear: Tympanic membrane normal. There is no impacted cerumen.     Left Ear: Tympanic membrane normal. There is no impacted cerumen.     Nose: Nose normal.     Mouth/Throat:      Mouth: Mucous membranes are moist.  Eyes:     General:        Right eye: No discharge.        Left eye: No discharge.     Pupils: Pupils are equal, round, and reactive to light.  Cardiovascular:     Rate and Rhythm: Normal rate and regular rhythm.     Pulses: Normal pulses.     Heart sounds: Normal heart sounds.  Pulmonary:     Effort: Pulmonary effort is normal. No respiratory distress.     Breath sounds: Normal breath sounds. No wheezing or  rales.  Abdominal:     General: Bowel sounds are normal. There is no distension.     Palpations: Abdomen is soft.     Tenderness: There is no abdominal tenderness.  Musculoskeletal:     Cervical back: Neck supple.     Right lower leg: Edema present.     Left lower leg: Edema present.     Comments: Non pitting  Skin:    General: Skin is warm.     Capillary Refill: Capillary refill takes less than 2 seconds.     Findings: Lesion present.     Comments: Pea sized scabbed lesion to forehead, no sign of infection  Neurological:     General: No focal deficit present.     Mental Status: She is alert. Mental status is at baseline.     Gait: Gait abnormal.  Psychiatric:        Mood and Affect: Mood normal.     Comments: Aphasia, alert to self/familiar face, follows commands     Labs reviewed: Recent Labs    09/12/23 0000 04/13/24 0000  NA 133* 134*  K 4.5 4.5  CL 98* 100  CO2 22 27*  BUN 16 14  CREATININE 0.7 0.6  CALCIUM  9.3 9.6   Recent Labs    09/12/23 0000 04/13/24 0000  AST 14 13  ALT 10 9  ALKPHOS 68 71  ALBUMIN 3.9 4.1   Recent Labs    09/12/23 0000 04/13/24 0000  WBC 6.2 6.4  NEUTROABS 3,900.00 3,917.00  HGB 11.9* 12.0  HCT 37 37  PLT 445* 450*   Lab Results  Component Value Date   TSH 2.03 05/28/2024   No results found for: HGBA1C Lab Results  Component Value Date   CHOL 190 07/26/2020   HDL 56 07/26/2020   LDLCALC 109 (H) 07/26/2020   TRIG 139 07/26/2020   CHOLHDL 3.4 07/26/2020    Significant  Diagnostic Results in last 30 days:  No results found.  Assessment/Plan 1. Tooth infection (Primary) - 09/09 right molar pain and swelling> Augmentin  x 5 days started - 09/12 dental evaluation> chlorhexidine  rinses started  2. Acute deep vein thrombosis (DVT) of proximal vein of left lower extremity (HCC) -  07/22 LLE swollen - dopplers confirmed  left superior femoral, popliteal and posterior tibial veins - cont Eliquis   3. Dementia without behavioral disturbance (HCC) - no agitation - ? skin picking> lesion to forehead - dependent with ADLs except eating - weight stable - not on medication  4. Essential hypertension - controlled with amlodipine  and losartan   5. Chronic pain of right hip - ongoing - cont tylenol   6. Lymphedema - s/p mastectomy 2000  7. Senile osteoporosis - cont vitamin D   8. Mixed stress and urge urinary incontinence - cont oxybutynin    Family/ staff Communication: plan discussed with patient and nurse  Labs/tests ordered:  none

## 2024-09-02 DIAGNOSIS — R278 Other lack of coordination: Secondary | ICD-10-CM | POA: Diagnosis not present

## 2024-09-02 DIAGNOSIS — M6281 Muscle weakness (generalized): Secondary | ICD-10-CM | POA: Diagnosis not present

## 2024-09-08 DIAGNOSIS — M6281 Muscle weakness (generalized): Secondary | ICD-10-CM | POA: Diagnosis not present

## 2024-09-08 DIAGNOSIS — R278 Other lack of coordination: Secondary | ICD-10-CM | POA: Diagnosis not present

## 2024-09-13 DIAGNOSIS — R278 Other lack of coordination: Secondary | ICD-10-CM | POA: Diagnosis not present

## 2024-09-13 DIAGNOSIS — M6281 Muscle weakness (generalized): Secondary | ICD-10-CM | POA: Diagnosis not present

## 2024-09-15 DIAGNOSIS — M6281 Muscle weakness (generalized): Secondary | ICD-10-CM | POA: Diagnosis not present

## 2024-09-15 DIAGNOSIS — R278 Other lack of coordination: Secondary | ICD-10-CM | POA: Diagnosis not present

## 2024-09-20 DIAGNOSIS — M6281 Muscle weakness (generalized): Secondary | ICD-10-CM | POA: Diagnosis not present

## 2024-09-20 DIAGNOSIS — R278 Other lack of coordination: Secondary | ICD-10-CM | POA: Diagnosis not present

## 2024-09-22 DIAGNOSIS — M6281 Muscle weakness (generalized): Secondary | ICD-10-CM | POA: Diagnosis not present

## 2024-09-22 DIAGNOSIS — R278 Other lack of coordination: Secondary | ICD-10-CM | POA: Diagnosis not present

## 2024-09-23 ENCOUNTER — Non-Acute Institutional Stay (SKILLED_NURSING_FACILITY): Payer: Self-pay | Admitting: Internal Medicine

## 2024-09-23 DIAGNOSIS — E890 Postprocedural hypothyroidism: Secondary | ICD-10-CM

## 2024-09-23 DIAGNOSIS — I10 Essential (primary) hypertension: Secondary | ICD-10-CM

## 2024-09-23 DIAGNOSIS — I824Y2 Acute embolism and thrombosis of unspecified deep veins of left proximal lower extremity: Secondary | ICD-10-CM

## 2024-09-23 DIAGNOSIS — N3946 Mixed incontinence: Secondary | ICD-10-CM | POA: Diagnosis not present

## 2024-09-23 DIAGNOSIS — F0390 Unspecified dementia without behavioral disturbance: Secondary | ICD-10-CM | POA: Diagnosis not present

## 2024-09-27 DIAGNOSIS — M6281 Muscle weakness (generalized): Secondary | ICD-10-CM | POA: Diagnosis not present

## 2024-09-27 DIAGNOSIS — R278 Other lack of coordination: Secondary | ICD-10-CM | POA: Diagnosis not present

## 2024-09-29 DIAGNOSIS — M6281 Muscle weakness (generalized): Secondary | ICD-10-CM | POA: Diagnosis not present

## 2024-09-29 DIAGNOSIS — R278 Other lack of coordination: Secondary | ICD-10-CM | POA: Diagnosis not present

## 2024-10-01 ENCOUNTER — Encounter: Payer: Self-pay | Admitting: Internal Medicine

## 2024-10-01 NOTE — Progress Notes (Signed)
 [facility]  S[facility] (31)  Provider: Fredia LITTIE Bring   Code Status: DNR Goals of Care:     06/29/2024   10:15 AM  Advanced Directives  Does Patient Have a Medical Advance Directive? Yes  Type of Estate agent of [place];Out of facility DNR (pink MOST or yellow form);Living will  Does patient want to make changes to medical advance directive? No - Patient declined  Copy of Healthcare Power of Attorney in Chart? Yes - validated most recent copy scanned in chart (See row information)  Pre-existing out of facility DNR order (yellow form or pink MOST form) Pink MOST/Yellow Form most recent copy in chart - Physician notified to receive inpatient order     Chief Complaint  Patient presents with   Care Management    HPI: Patient is a 89 y.o. female seen today for medical management of chronic diseases.     L[facility] in SNF [facility] has h/o Hypertension, Hypothyroidism, Insomnia and Cognitive impairment. Also Has h/o Skin Cancer and Thyroid  Cancer  Urinary Incontinence Failed Pessary Right hip Pain Has severe arthritis Left Arm Lymphadenopathy  Acute DVT in 06/2024 Dopplers Done Show Extensive Acute DVT in Left Super femoral, popliteal and posterior tibial veins   She  is stable.No new Nursing issues. 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   Hypertension    Insomnia    Knee pain,  bilateral 2014   Lymphedema    Left arm following mastectomy   Memory impairment    Memory loss    Migraine 05/16/2012   Migraine without aura, without mention of intractable migraine without mention of status migrainosus    patient denies at preop of 08/08/16    Other and unspecified hyperlipidemia    Other bursitis disorders    Other infective bursitis, left shoulder    Pain in joint, ankle and foot    Pain in joint, hand    Pain in joint, lower leg    Pain in joint, shoulder region    Pain in joint, shoulder region 04/23/2016   Pain in limb    Rectal bleeding 08/20/2016   Senile osteoporosis    Symptomatic menopausal or female climacteric states    Syncope and collapse    Unspecified hypertensive heart disease without heart failure 1995   Unspecified hypothyroidism    Unspecified polyarthropathy or polyarthritis, site unspecified    Unspecified urinary incontinence    Unspecified visual loss    Xerophthalmia     Past Surgical History:  Procedure Laterality Date   ABDOMINAL HYSTERECTOMY     BILATERAL OOPHORECTOMY  1981   BREAST RECONSTRUCTION Left 01/2000   BREAST REDUCTION SURGERY Right 01/2000   CATARACT EXTRACTION Right 09/04/2005   COLONOSCOPY  01/2004   MASTECTOMY Left 10/1999  THYROID  LOBECTOMY Left 08/14/2016   Procedure: NEAR TOTAL THYROIDECTOMY;  Surgeon: Donnice Lunger, MD;  Location: [facility];  Service: General;  Laterality: Left;   TOTAL ABDOMINAL HYSTERECTOMY W/ BILATERAL SALPINGOOPHORECTOMY  1981   TOTAL KNEE ARTHROPLASTY Right 2014    Allergies  Allergen Reactions   Donepezil  Hcl     Body pain and stiffness    Namenda  [Memantine  Hcl]      Caused body pain    Sulfa Antibiotics    Sulfamethoxazole Rash    Outpatient Encounter Medications as of 09/23/2024  Medication Sig   acetaminophen  (TYLENOL ) 325 MG tablet Take 650 mg by mouth 3 (three) times daily.   acetaminophen  (TYLENOL ) 500 MG tablet Take 1,000 mg by mouth. Daily as needed.   amLODipine   (NORVASC ) 10 MG tablet Take 1 tablet (10 mg total) by mouth daily.   apixaban  (ELIQUIS ) 5 MG TABS tablet Take 2 tablets (10mg ) twice daily for 7 days, then 1 tablet (5mg ) twice daily   chlorhexidine  (PERIDEX ) 0.12 % solution Use as directed 5 mLs in the mouth or throat as directed. Give 5 ml by mouth at bedtime for mouth care Brush on 1 tsp of solution to teeth and gums with a toothbrush after PM mouth care. Spit out excess and do not rinse   Cholecalciferol 5000 units capsule Take 5,000 Units by mouth daily.   diclofenac Sodium (VOLTAREN) 1 % GEL Apply 2 g topically daily at 12 noon.   guaiFENesin -dextromethorphan (ROBITUSSIN DM) 100-10 MG/5ML syrup Take 10 mLs by mouth every 6 (six) hours as needed for cough.   levothyroxine  (SYNTHROID ) 112 MCG tablet Take 112 mcg by mouth daily before breakfast.   lidocaine  (HM LIDOCAINE  PATCH) 4 % Place 2 patches onto the skin daily. Apply to lower back topically one time a day.   losartan  (COZAAR ) 100 MG tablet Take 100 mg by mouth daily.   melatonin 5 MG TABS Take 1 tablet (5 mg total) by mouth at bedtime.   Multiple Vitamins-Minerals (THEREMS-M) TABS Take 1 tablet by mouth every morning.   Omega-3 Fatty Acids (OMEGA-3 CF PO) Take 1,000 mg by mouth every morning. With food   oxybutynin (DITROPAN XL) 15 MG 24 hr tablet Take 15 mg by mouth at bedtime.   Psyllium (REGULOID) 48.57 % POWD Take by mouth daily. 1tsp   senna-docusate (SENNA S) 8.6-50 MG tablet Take 2 tablets by mouth 2 (two) times a week. Give on Monday and Thursday   triamcinolone  (KENALOG ) 0.025 % cream Apply 1 application  topically as needed (Apply to affected area topically).   zinc oxide 20 % ointment Apply 1 application  topically as needed for irritation. Apply to buttocks topically after every incontinent episode for redness   No facility-administered encounter medications on file as of 09/23/2024.    Review of Systems:  Review of Systems  Constitutional:  Negative for activity change  and appetite change.  HENT: Negative.    Respiratory:  Negative for cough and shortness of breath.   Cardiovascular:  Positive for leg swelling.  Gastrointestinal:  Negative for constipation.  Genitourinary: Negative.   Musculoskeletal:  Positive for arthralgias and gait problem. Negative for myalgias.  Skin: Negative.   Neurological:  Negative for dizziness and weakness.  Psychiatric/Behavioral:  Positive for confusion. Negative for dysphoric mood and sleep disturbance.     Health Maintenance  Topic Date Due   OPHTHALMOLOGY EXAM  05/28/2024   Influenza Vaccine  07/09/2024   COVID-19 Vaccine (7 - 2025-26 season) 08/09/2024  Medicare Annual Wellness (AWV)  02/26/2025   DTaP/Tdap/Td (3 - Td or Tdap) 10/30/2027   Pneumococcal Vaccine: 50+ Years  Completed   DEXA SCAN  Completed   Zoster Vaccines- Shingrix  Completed   Meningococcal B Vaccine  Aged Out   Mammogram  Discontinued    Physical Exam: Vitals:   09/23/24 1052  BP: 136/69  Pulse: 76  Resp: 18  Temp: 97.6 F (36.4 C)  Weight: 170 lb (77.1 kg)   Body mass index is 28.29 kg/m. Physical Exam Vitals reviewed.  Constitutional:      Appearance: Normal appearance.  HENT:     Head: Normocephalic.     Nose: Nose normal.     Mouth/Throat:     Mouth: Mucous membranes are moist.     Pharynx: Oropharynx is clear.  Eyes:     Pupils: Pupils are equal, round, and reactive to light.  Cardiovascular:     Rate and Rhythm: Normal rate and regular rhythm.     Pulses: Normal pulses.     Heart sounds: Normal heart sounds. No murmur heard. Pulmonary:     Effort: Pulmonary effort is normal.     Breath sounds: Normal breath sounds.  Abdominal:     General: Abdomen is flat. Bowel sounds are normal.     Palpations: Abdomen is soft.  Musculoskeletal:        General: No swelling.     Cervical back: Neck supple.     Comments: Mild swelling bilateral  Skin:    General: Skin is warm.  Neurological:     General: No focal deficit  present.     Mental Status: She is alert.  Psychiatric:        Mood and Affect: Mood normal.        Thought Content: Thought content normal.     Labs reviewed: Basic Metabolic Panel: Recent Labs    10/24/23 0000 04/13/24 0000 05/28/24 0000  NA  --  134*  --   K  --  4.5  --   CL  --  100  --   CO2  --  27*  --   BUN  --  14  --   CREATININE  --  0.6  --   CALCIUM   --  9.6  --   TSH 4.68 11.54* 2.03   Liver Function Tests: Recent Labs    04/13/24 0000  AST 13  ALT 9  ALKPHOS 71  ALBUMIN 4.1   No results for input(s): LIPASE, AMYLASE in the last 8760 hours. No results for input(s): AMMONIA in the last 8760 hours. CBC: Recent Labs    04/13/24 0000  WBC 6.4  NEUTROABS 3,917.00  HGB 12.0  HCT 37  PLT 450*   Lipid Panel: No results for input(s): CHOL, HDL, LDLCALC, TRIG, CHOLHDL, LDLDIRECT in the last 8760 hours. No results found for: HGBA1C  Procedures since last visit: No results found.  Assessment/Plan 1. Acute deep vein thrombosis (DVT) of proximal vein of left lower extremity (HCC) (Primary) On Eliquis  since 06/2024  2. Dementia without behavioral disturbance (HCC) Failed Meds Now in SNF doing well  3. Essential hypertension Losartan   4. Mixed stress and urge urinary incontinence Ditropan  5. Postoperative hypothyroidism TSH normal in 05/2024   Labs/tests ordered:    Fredia LITTIE Bring, MD

## 2024-10-04 DIAGNOSIS — M6281 Muscle weakness (generalized): Secondary | ICD-10-CM | POA: Diagnosis not present

## 2024-10-04 DIAGNOSIS — R278 Other lack of coordination: Secondary | ICD-10-CM | POA: Diagnosis not present

## 2024-10-06 DIAGNOSIS — R278 Other lack of coordination: Secondary | ICD-10-CM | POA: Diagnosis not present

## 2024-10-06 DIAGNOSIS — M6281 Muscle weakness (generalized): Secondary | ICD-10-CM | POA: Diagnosis not present

## 2024-10-11 DIAGNOSIS — R278 Other lack of coordination: Secondary | ICD-10-CM | POA: Diagnosis not present

## 2024-10-11 DIAGNOSIS — M6281 Muscle weakness (generalized): Secondary | ICD-10-CM | POA: Diagnosis not present

## 2024-10-12 DIAGNOSIS — L602 Onychogryphosis: Secondary | ICD-10-CM | POA: Diagnosis not present

## 2024-10-12 DIAGNOSIS — M79672 Pain in left foot: Secondary | ICD-10-CM | POA: Diagnosis not present

## 2024-10-12 DIAGNOSIS — M79671 Pain in right foot: Secondary | ICD-10-CM | POA: Diagnosis not present

## 2024-10-13 DIAGNOSIS — R278 Other lack of coordination: Secondary | ICD-10-CM | POA: Diagnosis not present

## 2024-10-13 DIAGNOSIS — M6281 Muscle weakness (generalized): Secondary | ICD-10-CM | POA: Diagnosis not present

## 2024-10-18 DIAGNOSIS — M6281 Muscle weakness (generalized): Secondary | ICD-10-CM | POA: Diagnosis not present

## 2024-10-18 DIAGNOSIS — R278 Other lack of coordination: Secondary | ICD-10-CM | POA: Diagnosis not present

## 2024-10-20 DIAGNOSIS — M6281 Muscle weakness (generalized): Secondary | ICD-10-CM | POA: Diagnosis not present

## 2024-10-20 DIAGNOSIS — R278 Other lack of coordination: Secondary | ICD-10-CM | POA: Diagnosis not present

## 2024-10-25 DIAGNOSIS — M6281 Muscle weakness (generalized): Secondary | ICD-10-CM | POA: Diagnosis not present

## 2024-10-25 DIAGNOSIS — R278 Other lack of coordination: Secondary | ICD-10-CM | POA: Diagnosis not present

## 2024-10-27 DIAGNOSIS — R278 Other lack of coordination: Secondary | ICD-10-CM | POA: Diagnosis not present

## 2024-10-27 DIAGNOSIS — M6281 Muscle weakness (generalized): Secondary | ICD-10-CM | POA: Diagnosis not present

## 2024-11-01 DIAGNOSIS — R278 Other lack of coordination: Secondary | ICD-10-CM | POA: Diagnosis not present

## 2024-11-01 DIAGNOSIS — M6281 Muscle weakness (generalized): Secondary | ICD-10-CM | POA: Diagnosis not present

## 2024-11-03 DIAGNOSIS — R278 Other lack of coordination: Secondary | ICD-10-CM | POA: Diagnosis not present

## 2024-11-03 DIAGNOSIS — M6281 Muscle weakness (generalized): Secondary | ICD-10-CM | POA: Diagnosis not present

## 2024-11-08 DIAGNOSIS — M6281 Muscle weakness (generalized): Secondary | ICD-10-CM | POA: Diagnosis not present

## 2024-11-08 DIAGNOSIS — R278 Other lack of coordination: Secondary | ICD-10-CM | POA: Diagnosis not present

## 2024-11-10 DIAGNOSIS — R278 Other lack of coordination: Secondary | ICD-10-CM | POA: Diagnosis not present

## 2024-11-10 DIAGNOSIS — M6281 Muscle weakness (generalized): Secondary | ICD-10-CM | POA: Diagnosis not present

## 2024-11-25 ENCOUNTER — Non-Acute Institutional Stay: Payer: Self-pay | Admitting: Internal Medicine

## 2024-11-25 DIAGNOSIS — E890 Postprocedural hypothyroidism: Secondary | ICD-10-CM

## 2024-11-25 DIAGNOSIS — N3946 Mixed incontinence: Secondary | ICD-10-CM | POA: Diagnosis not present

## 2024-11-25 DIAGNOSIS — F0390 Unspecified dementia without behavioral disturbance: Secondary | ICD-10-CM

## 2024-11-25 DIAGNOSIS — G8929 Other chronic pain: Secondary | ICD-10-CM

## 2024-11-25 DIAGNOSIS — H6123 Impacted cerumen, bilateral: Secondary | ICD-10-CM

## 2024-11-25 DIAGNOSIS — I824Y2 Acute embolism and thrombosis of unspecified deep veins of left proximal lower extremity: Secondary | ICD-10-CM | POA: Diagnosis not present

## 2024-11-25 DIAGNOSIS — M25551 Pain in right hip: Secondary | ICD-10-CM

## 2024-11-25 DIAGNOSIS — I10 Essential (primary) hypertension: Secondary | ICD-10-CM | POA: Diagnosis not present

## 2024-11-28 ENCOUNTER — Encounter: Payer: Self-pay | Admitting: Internal Medicine

## 2024-11-28 NOTE — Progress Notes (Signed)
 "  Location:  [facility] Service:  SNF ([id number])  Provider:   Code Status: DNR Goals of Care:     06/29/2024   10:15 AM  Advanced Directives  Does Patient Have a Medical Advance Directive? Yes  Type of Estate Agent of [place];Out of facility DNR (pink MOST or yellow form);Living will  Does patient want to make changes to medical advance directive? No - Patient declined  Copy of Healthcare Power of Attorney in Chart? Yes - validated most recent copy scanned in chart (See row information)  Pre-existing out of facility DNR order (yellow form or pink MOST form) Pink MOST/Yellow Form most recent copy in chart - Physician notified to receive inpatient order     Chief Complaint  Patient presents with   Care Management    HPI: Patient is a 89 y.o. female seen today for medical management of chronic diseases.    L[facility] SN[facility]  has h/o Hypertension, Hypothyroidism, Insomnia and Cognitive impairment.  Also Has h/o Skin Cancer and Thyroid  Cancer  Urinary Incontinence Failed Pessary Right hip Pain Has severe arthritis Left Arm Lymphadenopathy   Acute DVT in 06/2024 Dopplers Done Show Extensive Acute DVT in Left Super femoral, popliteal and posterior tibial veins      She is stable. No new Nursing issues. 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   Hypertension    Insomnia    Knee pain, bilateral 2014    Lymphedema    Left arm following mastectomy   Memory impairment    Memory loss    Migraine 05/16/2012   Migraine without aura, without mention of intractable migraine without mention of status migrainosus    patient denies at preop of 08/08/16    Other and unspecified hyperlipidemia    Other bursitis disorders    Other infective bursitis, left shoulder    Pain in joint, ankle and foot    Pain in joint, hand    Pain in joint, lower leg    Pain in joint, shoulder region    Pain in joint, shoulder region 04/23/2016   Pain in limb    Rectal bleeding 08/20/2016   Senile osteoporosis    Symptomatic menopausal or female climacteric states    Syncope and collapse    Unspecified hypertensive heart disease without heart failure 1995   Unspecified hypothyroidism    Unspecified polyarthropathy or polyarthritis, site unspecified    Unspecified urinary incontinence    Unspecified visual loss    Xerophthalmia     Past Surgical History:  Procedure Laterality Date   ABDOMINAL HYSTERECTOMY     BILATERAL OOPHORECTOMY  1981   BREAST RECONSTRUCTION Left 01/2000   BREAST REDUCTION SURGERY Right 01/2000   CATARACT EXTRACTION Right 09/04/2005   COLONOSCOPY  01/2004   MASTECTOMY Left  10/1999   THYROID  LOBECTOMY Left 08/14/2016   Procedure: NEAR TOTAL THYROIDECTOMY;  Surgeon: Donnice Lunger, MD;  Location: [facility];  Service: General;  Laterality: Left;   TOTAL ABDOMINAL HYSTERECTOMY W/ BILATERAL SALPINGOOPHORECTOMY  1981   TOTAL KNEE ARTHROPLASTY Right 2014    Allergies[1]  Outpatient Encounter Medications as of 11/25/2024  Medication Sig   acetaminophen  (TYLENOL ) 325 MG tablet Take 650 mg by mouth 3 (three) times daily.   acetaminophen  (TYLENOL ) 500 MG tablet Take 1,000 mg by mouth. Daily as needed.   amLODipine  (NORVASC ) 10 MG tablet Take 1 tablet (10 mg total) by mouth daily.   apixaban  (ELIQUIS ) 5 MG TABS tablet Take 2 tablets (10mg ) twice daily for 7 days, then 1 tablet (5mg ) twice daily    chlorhexidine  (PERIDEX ) 0.12 % solution Use as directed 5 mLs in the mouth or throat as directed. Give 5 ml by mouth at bedtime for mouth care Brush on 1 tsp of solution to teeth and gums with a toothbrush after PM mouth care. Spit out excess and do not rinse   Cholecalciferol 5000 units capsule Take 5,000 Units by mouth daily.   diclofenac Sodium (VOLTAREN) 1 % GEL Apply 2 g topically daily at 12 noon.   guaiFENesin -dextromethorphan (ROBITUSSIN DM) 100-10 MG/5ML syrup Take 10 mLs by mouth every 6 (six) hours as needed for cough.   levothyroxine  (SYNTHROID ) 112 MCG tablet Take 112 mcg by mouth daily before breakfast.   lidocaine  (HM LIDOCAINE  PATCH) 4 % Place 2 patches onto the skin daily. Apply to lower back topically one time a day.   losartan  (COZAAR ) 100 MG tablet Take 100 mg by mouth daily.   melatonin 5 MG TABS Take 1 tablet (5 mg total) by mouth at bedtime.   Multiple Vitamins-Minerals (THEREMS-M) TABS Take 1 tablet by mouth every morning.   Omega-3 Fatty Acids (OMEGA-3 CF PO) Take 1,000 mg by mouth every morning. With food   oxybutynin (DITROPAN XL) 15 MG 24 hr tablet Take 15 mg by mouth at bedtime.   Psyllium (REGULOID) 48.57 % POWD Take by mouth daily. 1tsp   senna-docusate (SENNA S) 8.6-50 MG tablet Take 2 tablets by mouth 2 (two) times a week. Give on Monday and Thursday   triamcinolone  (KENALOG ) 0.025 % cream Apply 1 application  topically as needed (Apply to affected area topically).   zinc oxide 20 % ointment Apply 1 application  topically as needed for irritation. Apply to buttocks topically after every incontinent episode for redness   No facility-administered encounter medications on file as of 11/25/2024.    Review of Systems:  Review of Systems  Unable to perform ROS: Dementia    Health Maintenance  Topic Date Due   OPHTHALMOLOGY EXAM  05/28/2024   Influenza Vaccine  07/09/2024   COVID-19 Vaccine (7 - 2025-26 season) 08/09/2024   Medicare Annual Wellness (AWV)   02/26/2025   DTaP/Tdap/Td (3 - Td or Tdap) 10/30/2027   Pneumococcal Vaccine: 50+ Years  Completed   Bone Density Scan  Completed   Zoster Vaccines- Shingrix  Completed   Meningococcal B Vaccine  Aged Out   Mammogram  Discontinued    Physical Exam: Vitals:   11/25/24 0924  BP: 112/62  Pulse: 63  Resp: 20  Temp: 97.6 F (36.4 C)  Weight: 173 lb 9.6 oz (78.7 kg)   Body mass index is 28.89 kg/m. Physical Exam Vitals reviewed.  Constitutional:      Appearance: Normal appearance.  HENT:     Head: Normocephalic.  Right Ear: Tympanic membrane normal.     Left Ear: Tympanic membrane normal.     Ears:     Comments: Wax in the Left Ear    Nose: Nose normal.     Mouth/Throat:     Mouth: Mucous membranes are moist.     Pharynx: Oropharynx is clear.  Eyes:     Pupils: Pupils are equal, round, and reactive to light.  Cardiovascular:     Rate and Rhythm: Normal rate and regular rhythm.     Pulses: Normal pulses.     Heart sounds: Normal heart sounds. No murmur heard. Pulmonary:     Effort: Pulmonary effort is normal.     Breath sounds: Normal breath sounds.  Abdominal:     General: Abdomen is flat. Bowel sounds are normal.     Palpations: Abdomen is soft.  Musculoskeletal:        General: No swelling.     Cervical back: Neck supple.  Skin:    General: Skin is warm.  Neurological:     General: No focal deficit present.     Mental Status: She is alert and oriented to person, place, and time.  Psychiatric:        Mood and Affect: Mood normal.        Thought Content: Thought content normal.     Labs reviewed: Basic Metabolic Panel: Recent Labs    04/13/24 0000 05/28/24 0000  NA 134*  --   K 4.5  --   CL 100  --   CO2 27*  --   BUN 14  --   CREATININE 0.6  --   CALCIUM  9.6  --   TSH 11.54* 2.03   Liver Function Tests: Recent Labs    04/13/24 0000  AST 13  ALT 9  ALKPHOS 71  ALBUMIN 4.1   No results for input(s): LIPASE, AMYLASE in the last 8760  hours. No results for input(s): AMMONIA in the last 8760 hours. CBC: Recent Labs    04/13/24 0000  WBC 6.4  NEUTROABS 3,917.00  HGB 12.0  HCT 37  PLT 450*   Lipid Panel: No results for input(s): CHOL, HDL, LDLCALC, TRIG, CHOLHDL, LDLDIRECT in the last 8760 hours. No results found for: HGBA1C  Procedures since last visit: No results found.  Assessment/Plan 1. Acute deep vein thrombosis (DVT) of proximal vein of left lower extremity (HCC) (Primary) Eliquis  since 06/2024   2. Dementia without behavioral disturbance (HCC) Continuous to do well in SNF setting  3. Essential hypertension On Losartan  and Norvasc   4. Mixed stress and urge urinary incontinence Ditropan  5. Postoperative hypothyroidism TSH normal in 05/2024  6. Chronic pain of right hip Tylenol   7. Bilateral impacted cerumen Debrox Written followed by Ear wash    Labs/tests ordered:   Next appt:  Visit date not found        [1]  Allergies Allergen Reactions   Donepezil  Hcl     Body pain and stiffness    Namenda  [Memantine  Hcl]      Caused body pain    Sulfa Antibiotics    Sulfamethoxazole Rash   "
